# Patient Record
Sex: Female | Born: 1960
Health system: Southern US, Community
[De-identification: ages and names within clinical notes are randomized; demographics above are authoritative.]

## PROBLEM LIST (undated history)

## (undated) DIAGNOSIS — F32A Depression, unspecified: Secondary | ICD-10-CM

## (undated) DIAGNOSIS — F419 Anxiety disorder, unspecified: Secondary | ICD-10-CM

## (undated) DIAGNOSIS — D649 Anemia, unspecified: Secondary | ICD-10-CM

## (undated) DIAGNOSIS — M549 Dorsalgia, unspecified: Secondary | ICD-10-CM

## (undated) DIAGNOSIS — T7840XA Allergy, unspecified, initial encounter: Secondary | ICD-10-CM

## (undated) DIAGNOSIS — K648 Other hemorrhoids: Secondary | ICD-10-CM

## (undated) DIAGNOSIS — G4730 Sleep apnea, unspecified: Secondary | ICD-10-CM

## (undated) DIAGNOSIS — K219 Gastro-esophageal reflux disease without esophagitis: Secondary | ICD-10-CM

## (undated) DIAGNOSIS — G510 Bell's palsy: Secondary | ICD-10-CM

## (undated) DIAGNOSIS — K760 Fatty (change of) liver, not elsewhere classified: Secondary | ICD-10-CM

## (undated) DIAGNOSIS — M1990 Unspecified osteoarthritis, unspecified site: Secondary | ICD-10-CM

## (undated) DIAGNOSIS — I10 Essential (primary) hypertension: Secondary | ICD-10-CM

## (undated) DIAGNOSIS — E785 Hyperlipidemia, unspecified: Secondary | ICD-10-CM

## (undated) DIAGNOSIS — G35 Multiple sclerosis: Secondary | ICD-10-CM

## (undated) DIAGNOSIS — K222 Esophageal obstruction: Secondary | ICD-10-CM

## (undated) DIAGNOSIS — K449 Diaphragmatic hernia without obstruction or gangrene: Secondary | ICD-10-CM

## (undated) DIAGNOSIS — K5790 Diverticulosis of intestine, part unspecified, without perforation or abscess without bleeding: Secondary | ICD-10-CM

## (undated) DIAGNOSIS — F329 Major depressive disorder, single episode, unspecified: Secondary | ICD-10-CM

## (undated) DIAGNOSIS — G35D Multiple sclerosis, unspecified: Secondary | ICD-10-CM

## (undated) HISTORY — DX: Essential (primary) hypertension: I10

## (undated) HISTORY — DX: Diverticulosis of intestine, part unspecified, without perforation or abscess without bleeding: K57.90

## (undated) HISTORY — DX: Other hemorrhoids: K64.8

## (undated) HISTORY — DX: Hyperlipidemia, unspecified: E78.5

## (undated) HISTORY — DX: Anxiety disorder, unspecified: F41.9

## (undated) HISTORY — DX: Major depressive disorder, single episode, unspecified: F32.9

## (undated) HISTORY — PX: COLONOSCOPY: SHX174

## (undated) HISTORY — DX: Multiple sclerosis, unspecified: G35.D

## (undated) HISTORY — DX: Unspecified osteoarthritis, unspecified site: M19.90

## (undated) HISTORY — DX: Multiple sclerosis: G35

## (undated) HISTORY — DX: Allergy, unspecified, initial encounter: T78.40XA

## (undated) HISTORY — PX: UPPER GASTROINTESTINAL ENDOSCOPY: SHX188

## (undated) HISTORY — DX: Diaphragmatic hernia without obstruction or gangrene: K44.9

## (undated) HISTORY — DX: Gastro-esophageal reflux disease without esophagitis: K21.9

## (undated) HISTORY — DX: Anemia, unspecified: D64.9

## (undated) HISTORY — DX: Sleep apnea, unspecified: G47.30

## (undated) HISTORY — DX: Depression, unspecified: F32.A

## (undated) HISTORY — PX: WISDOM TOOTH EXTRACTION: SHX21

## (undated) HISTORY — DX: Fatty (change of) liver, not elsewhere classified: K76.0

## (undated) HISTORY — DX: Esophageal obstruction: K22.2

## (undated) HISTORY — DX: Bell's palsy: G51.0

---

## 1998-12-05 ENCOUNTER — Other Ambulatory Visit: Admission: RE | Admit: 1998-12-05 | Discharge: 1998-12-05 | Payer: Self-pay | Admitting: Obstetrics and Gynecology

## 2000-06-04 ENCOUNTER — Other Ambulatory Visit: Admission: RE | Admit: 2000-06-04 | Discharge: 2000-06-04 | Payer: Self-pay | Admitting: Obstetrics and Gynecology

## 2000-08-12 ENCOUNTER — Other Ambulatory Visit: Admission: RE | Admit: 2000-08-12 | Discharge: 2000-08-12 | Payer: Self-pay | Admitting: Obstetrics and Gynecology

## 2000-08-12 ENCOUNTER — Encounter (INDEPENDENT_AMBULATORY_CARE_PROVIDER_SITE_OTHER): Payer: Self-pay

## 2001-01-20 ENCOUNTER — Ambulatory Visit (HOSPITAL_COMMUNITY): Admission: RE | Admit: 2001-01-20 | Discharge: 2001-01-20 | Payer: Self-pay | Admitting: Obstetrics and Gynecology

## 2001-01-20 ENCOUNTER — Encounter: Payer: Self-pay | Admitting: Obstetrics and Gynecology

## 2002-04-10 ENCOUNTER — Encounter (INDEPENDENT_AMBULATORY_CARE_PROVIDER_SITE_OTHER): Payer: Self-pay

## 2002-04-10 ENCOUNTER — Observation Stay (HOSPITAL_COMMUNITY): Admission: RE | Admit: 2002-04-10 | Discharge: 2002-04-11 | Payer: Self-pay | Admitting: Obstetrics and Gynecology

## 2002-08-01 ENCOUNTER — Ambulatory Visit (HOSPITAL_COMMUNITY): Admission: RE | Admit: 2002-08-01 | Discharge: 2002-08-01 | Payer: Self-pay | Admitting: Internal Medicine

## 2002-08-01 ENCOUNTER — Encounter: Payer: Self-pay | Admitting: Internal Medicine

## 2003-04-28 HISTORY — PX: ABDOMINAL HYSTERECTOMY: SHX81

## 2004-02-26 ENCOUNTER — Ambulatory Visit (HOSPITAL_COMMUNITY): Admission: RE | Admit: 2004-02-26 | Discharge: 2004-02-26 | Payer: Self-pay | Admitting: Obstetrics and Gynecology

## 2005-04-02 ENCOUNTER — Ambulatory Visit (HOSPITAL_COMMUNITY): Admission: RE | Admit: 2005-04-02 | Discharge: 2005-04-02 | Payer: Self-pay | Admitting: Neurosurgery

## 2005-07-02 ENCOUNTER — Ambulatory Visit: Payer: Self-pay | Admitting: Family Medicine

## 2005-07-14 ENCOUNTER — Ambulatory Visit (HOSPITAL_COMMUNITY): Admission: RE | Admit: 2005-07-14 | Discharge: 2005-07-14 | Payer: Self-pay | Admitting: Family Medicine

## 2005-07-28 ENCOUNTER — Encounter: Admission: RE | Admit: 2005-07-28 | Discharge: 2005-08-10 | Payer: Self-pay | Admitting: Neurology

## 2005-07-28 ENCOUNTER — Ambulatory Visit: Payer: Self-pay | Admitting: *Deleted

## 2005-07-29 ENCOUNTER — Ambulatory Visit (HOSPITAL_COMMUNITY): Admission: RE | Admit: 2005-07-29 | Discharge: 2005-07-29 | Payer: Self-pay | Admitting: Neurology

## 2005-08-03 ENCOUNTER — Ambulatory Visit (HOSPITAL_COMMUNITY): Admission: RE | Admit: 2005-08-03 | Discharge: 2005-08-04 | Payer: Self-pay | Admitting: *Deleted

## 2005-08-04 ENCOUNTER — Ambulatory Visit: Payer: Self-pay | Admitting: *Deleted

## 2005-08-06 ENCOUNTER — Ambulatory Visit: Payer: Self-pay | Admitting: Cardiology

## 2005-08-17 ENCOUNTER — Ambulatory Visit: Payer: Self-pay | Admitting: *Deleted

## 2005-08-27 ENCOUNTER — Ambulatory Visit: Payer: Self-pay | Admitting: Family Medicine

## 2005-09-03 ENCOUNTER — Ambulatory Visit: Payer: Self-pay | Admitting: Internal Medicine

## 2006-01-13 ENCOUNTER — Ambulatory Visit: Payer: Self-pay | Admitting: Family Medicine

## 2006-01-25 ENCOUNTER — Encounter: Payer: Self-pay | Admitting: Family Medicine

## 2006-05-19 ENCOUNTER — Ambulatory Visit: Payer: Self-pay | Admitting: Family Medicine

## 2006-05-26 ENCOUNTER — Encounter: Payer: Self-pay | Admitting: Family Medicine

## 2006-05-26 LAB — CONVERTED CEMR LAB
CO2: 29 meq/L (ref 19–32)
Chloride: 102 meq/L (ref 96–112)
Sodium: 140 meq/L (ref 135–145)
VLDL: 50 mg/dL — ABNORMAL HIGH (ref 0–40)

## 2006-09-15 ENCOUNTER — Ambulatory Visit: Payer: Self-pay | Admitting: Family Medicine

## 2006-11-19 ENCOUNTER — Ambulatory Visit: Payer: Self-pay | Admitting: Family Medicine

## 2006-11-19 LAB — CONVERTED CEMR LAB
BUN: 10 mg/dL (ref 6–23)
CO2: 23 meq/L (ref 19–32)
Calcium: 9.1 mg/dL (ref 8.4–10.5)
Potassium: 4 meq/L (ref 3.5–5.3)
Sodium: 141 meq/L (ref 135–145)

## 2006-11-22 ENCOUNTER — Ambulatory Visit (HOSPITAL_COMMUNITY): Admission: RE | Admit: 2006-11-22 | Discharge: 2006-11-22 | Payer: Self-pay | Admitting: Family Medicine

## 2007-03-07 ENCOUNTER — Encounter: Payer: Self-pay | Admitting: Family Medicine

## 2007-03-07 LAB — CONVERTED CEMR LAB
ALT: 16 units/L (ref 0–35)
Albumin: 4.7 g/dL (ref 3.5–5.2)
BUN: 16 mg/dL (ref 6–23)
Basophils Absolute: 0.1 10*3/uL (ref 0.0–0.1)
Basophils Relative: 1 % (ref 0–1)
CO2: 25 meq/L (ref 19–32)
Calcium: 9.8 mg/dL (ref 8.4–10.5)
Chloride: 98 meq/L (ref 96–112)
Cholesterol: 271 mg/dL — ABNORMAL HIGH (ref 0–200)
Creatinine, Ser: 1.11 mg/dL (ref 0.40–1.20)
Eosinophils Relative: 2 % (ref 0–5)
HCT: 35.5 % — ABNORMAL LOW (ref 36.0–46.0)
Lymphs Abs: 2 10*3/uL (ref 0.7–4.0)
MCV: 77.7 fL — ABNORMAL LOW (ref 78.0–100.0)
Neutro Abs: 4.2 10*3/uL (ref 1.7–7.7)
Sodium: 139 meq/L (ref 135–145)
Total Bilirubin: 0.5 mg/dL (ref 0.3–1.2)
VLDL: 47 mg/dL — ABNORMAL HIGH (ref 0–40)

## 2007-03-09 ENCOUNTER — Ambulatory Visit: Payer: Self-pay | Admitting: Family Medicine

## 2007-04-28 ENCOUNTER — Encounter: Payer: Self-pay | Admitting: Family Medicine

## 2007-05-03 ENCOUNTER — Ambulatory Visit: Payer: Self-pay | Admitting: Family Medicine

## 2007-07-05 ENCOUNTER — Encounter: Payer: Self-pay | Admitting: Family Medicine

## 2007-07-05 LAB — CONVERTED CEMR LAB: Retic Ct Pct: 1.1 % (ref 0.4–3.1)

## 2007-07-07 ENCOUNTER — Encounter: Payer: Self-pay | Admitting: Family Medicine

## 2007-07-07 ENCOUNTER — Ambulatory Visit: Payer: Self-pay | Admitting: Family Medicine

## 2007-08-18 ENCOUNTER — Encounter (HOSPITAL_COMMUNITY): Admission: RE | Admit: 2007-08-18 | Discharge: 2007-09-17 | Payer: Self-pay | Admitting: Family Medicine

## 2007-08-18 ENCOUNTER — Encounter: Payer: Self-pay | Admitting: Family Medicine

## 2007-08-19 ENCOUNTER — Encounter: Payer: Self-pay | Admitting: Family Medicine

## 2007-08-19 ENCOUNTER — Ambulatory Visit: Payer: Self-pay | Admitting: Family Medicine

## 2007-11-18 ENCOUNTER — Ambulatory Visit: Payer: Self-pay | Admitting: Family Medicine

## 2007-11-19 ENCOUNTER — Encounter: Payer: Self-pay | Admitting: Family Medicine

## 2007-11-19 LAB — CONVERTED CEMR LAB
ALT: 12 units/L (ref 0–35)
AST: 14 units/L (ref 0–37)
Albumin: 4.6 g/dL (ref 3.5–5.2)
BUN: 16 mg/dL (ref 6–23)
CO2: 24 meq/L (ref 19–32)
Calcium: 9.4 mg/dL (ref 8.4–10.5)
Chloride: 98 meq/L (ref 96–112)
Creatinine, Ser: 1.12 mg/dL (ref 0.40–1.20)
LDL Cholesterol: 193 mg/dL — ABNORMAL HIGH (ref 0–99)
Potassium: 3.8 meq/L (ref 3.5–5.3)
Sodium: 137 meq/L (ref 135–145)
TSH: 1.586 microintl units/mL (ref 0.350–4.50)
Total Bilirubin: 0.5 mg/dL (ref 0.3–1.2)
Total Protein: 7.5 g/dL (ref 6.0–8.3)
VLDL: 39 mg/dL (ref 0–40)

## 2007-11-21 ENCOUNTER — Ambulatory Visit: Payer: Self-pay | Admitting: Cardiology

## 2007-11-23 ENCOUNTER — Ambulatory Visit (HOSPITAL_COMMUNITY): Admission: RE | Admit: 2007-11-23 | Discharge: 2007-11-23 | Payer: Self-pay | Admitting: Family Medicine

## 2007-11-24 ENCOUNTER — Telehealth: Payer: Self-pay | Admitting: Family Medicine

## 2007-12-05 ENCOUNTER — Encounter (HOSPITAL_COMMUNITY): Admission: RE | Admit: 2007-12-05 | Discharge: 2008-01-04 | Payer: Self-pay | Admitting: Cardiology

## 2007-12-05 ENCOUNTER — Ambulatory Visit: Payer: Self-pay | Admitting: Cardiology

## 2007-12-16 ENCOUNTER — Ambulatory Visit: Payer: Self-pay | Admitting: Family Medicine

## 2007-12-16 DIAGNOSIS — E785 Hyperlipidemia, unspecified: Secondary | ICD-10-CM | POA: Insufficient documentation

## 2007-12-16 DIAGNOSIS — I10 Essential (primary) hypertension: Secondary | ICD-10-CM | POA: Insufficient documentation

## 2007-12-18 DIAGNOSIS — R51 Headache: Secondary | ICD-10-CM

## 2007-12-18 DIAGNOSIS — F418 Other specified anxiety disorders: Secondary | ICD-10-CM | POA: Insufficient documentation

## 2007-12-18 DIAGNOSIS — G4700 Insomnia, unspecified: Secondary | ICD-10-CM | POA: Insufficient documentation

## 2007-12-18 DIAGNOSIS — R519 Headache, unspecified: Secondary | ICD-10-CM | POA: Insufficient documentation

## 2007-12-18 DIAGNOSIS — G44309 Post-traumatic headache, unspecified, not intractable: Secondary | ICD-10-CM | POA: Insufficient documentation

## 2007-12-19 ENCOUNTER — Ambulatory Visit: Payer: Self-pay | Admitting: Cardiology

## 2007-12-19 ENCOUNTER — Encounter: Payer: Self-pay | Admitting: Family Medicine

## 2008-01-26 ENCOUNTER — Telehealth: Payer: Self-pay | Admitting: Family Medicine

## 2008-04-03 ENCOUNTER — Encounter: Payer: Self-pay | Admitting: Family Medicine

## 2008-04-04 ENCOUNTER — Ambulatory Visit: Payer: Self-pay | Admitting: Family Medicine

## 2008-04-04 DIAGNOSIS — G35 Multiple sclerosis: Secondary | ICD-10-CM | POA: Insufficient documentation

## 2008-04-04 DIAGNOSIS — R5383 Other fatigue: Secondary | ICD-10-CM | POA: Insufficient documentation

## 2008-04-04 HISTORY — DX: Multiple sclerosis: G35

## 2008-04-06 LAB — CONVERTED CEMR LAB
AST: 14 units/L (ref 0–37)
Glucose, Bld: 73 mg/dL (ref 70–99)
Sodium: 139 meq/L (ref 135–145)
Total Bilirubin: 0.4 mg/dL (ref 0.3–1.2)
Total CHOL/HDL Ratio: 6.4

## 2008-04-08 DIAGNOSIS — E663 Overweight: Secondary | ICD-10-CM | POA: Insufficient documentation

## 2009-12-16 ENCOUNTER — Ambulatory Visit: Payer: Self-pay | Admitting: Family Medicine

## 2009-12-16 DIAGNOSIS — M949 Disorder of cartilage, unspecified: Secondary | ICD-10-CM

## 2009-12-16 DIAGNOSIS — M899 Disorder of bone, unspecified: Secondary | ICD-10-CM | POA: Insufficient documentation

## 2009-12-16 DIAGNOSIS — M25519 Pain in unspecified shoulder: Secondary | ICD-10-CM | POA: Insufficient documentation

## 2009-12-16 DIAGNOSIS — M542 Cervicalgia: Secondary | ICD-10-CM | POA: Insufficient documentation

## 2009-12-17 ENCOUNTER — Encounter: Payer: Self-pay | Admitting: Family Medicine

## 2009-12-20 ENCOUNTER — Telehealth: Payer: Self-pay | Admitting: Family Medicine

## 2009-12-20 LAB — CONVERTED CEMR LAB
ALT: 22 units/L (ref 0–35)
Albumin: 4.7 g/dL (ref 3.5–5.2)
Basophils Relative: 1 % (ref 0–1)
CO2: 25 meq/L (ref 19–32)
Creatinine, Ser: 0.89 mg/dL (ref 0.40–1.20)
Eosinophils Absolute: 0.1 10*3/uL (ref 0.0–0.7)
Eosinophils Relative: 2 % (ref 0–5)
Glucose, Bld: 87 mg/dL (ref 70–99)
Hemoglobin: 12.6 g/dL (ref 12.0–15.0)
LDL Cholesterol: 194 mg/dL — ABNORMAL HIGH (ref 0–99)
Lymphocytes Relative: 34 % (ref 12–46)
Lymphs Abs: 2.7 10*3/uL (ref 0.7–4.0)
MCHC: 31.8 g/dL (ref 30.0–36.0)
Monocytes Absolute: 0.6 10*3/uL (ref 0.1–1.0)
Monocytes Relative: 8 % (ref 3–12)
Platelets: 373 10*3/uL (ref 150–400)
RBC: 4.93 M/uL (ref 3.87–5.11)
Sodium: 140 meq/L (ref 135–145)
TSH: 2.812 microintl units/mL (ref 0.350–4.500)
Total Bilirubin: 0.3 mg/dL (ref 0.3–1.2)
Vit D, 25-Hydroxy: 57 ng/mL (ref 30–89)

## 2009-12-23 ENCOUNTER — Telehealth: Payer: Self-pay | Admitting: Family Medicine

## 2009-12-26 ENCOUNTER — Ambulatory Visit (HOSPITAL_COMMUNITY): Admission: RE | Admit: 2009-12-26 | Discharge: 2009-12-26 | Payer: Self-pay | Admitting: Family Medicine

## 2010-04-07 ENCOUNTER — Telehealth: Payer: Self-pay | Admitting: Family Medicine

## 2010-04-22 ENCOUNTER — Encounter: Payer: Self-pay | Admitting: Family Medicine

## 2010-05-18 ENCOUNTER — Encounter: Payer: Self-pay | Admitting: Family Medicine

## 2010-05-27 ENCOUNTER — Ambulatory Visit
Admission: RE | Admit: 2010-05-27 | Discharge: 2010-05-27 | Payer: Self-pay | Source: Home / Self Care | Attending: Family Medicine | Admitting: Family Medicine

## 2010-05-27 ENCOUNTER — Encounter: Payer: Self-pay | Admitting: Family Medicine

## 2010-05-27 NOTE — Letter (Signed)
Summary: LABS  LABS   Imported By: Lind Guest 11/08/2009 13:24:06  _____________________________________________________________________  External Attachment:    Type:   Image     Comment:   External Document

## 2010-05-27 NOTE — Letter (Signed)
Summary: MISC  MISC   Imported By: Lind Guest 11/08/2009 13:24:36  _____________________________________________________________________  External Attachment:    Type:   Image     Comment:   External Document

## 2010-05-27 NOTE — Letter (Signed)
Summary: X RAY  X RAY   Imported By: Lind Guest 11/08/2009 13:26:06  _____________________________________________________________________  External Attachment:    Type:   Image     Comment:   External Document

## 2010-05-27 NOTE — Letter (Signed)
Summary: OFFICE NOTES  OFFICE NOTES   Imported By: Lind Guest 11/08/2009 13:25:08  _____________________________________________________________________  External Attachment:    Type:   Image     Comment:   External Document

## 2010-05-27 NOTE — Letter (Signed)
Summary: Letter  Letter   Imported By: Lind Guest 12/17/2009 14:21:24  _____________________________________________________________________  External Attachment:    Type:   Image     Comment:   External Document

## 2010-05-27 NOTE — Assessment & Plan Note (Signed)
Summary: FOLLOW UP/SLJ   Vital Signs:  Patient profile:   50 year old female Height:      63 inches Weight:      186 pounds BMI:     33.07 O2 Sat:      96 % Pulse rate:   82 / minute Pulse rhythm:   regular Resp:     16 per minute BP sitting:   130 / 84  (left arm) Cuff size:   regular  Vitals Entered By: Everitt Amber LPN (December 16, 2009 8:59 AM)  Nutrition Counseling: Patient's BMI is greater than 25 and therefore counseled on weight management options. CC: Follow up chronic problems, not taking any meds at this time and wants to see if you want her on the same meds as before or different  Comments not taking any meds at this time.   CC:  Follow up chronic problems and not taking any meds at this time and wants to see if you want her on the same meds as before or different .  History of Present Illness: Reports  that she has not been doing well.She stopped all of her meds for more than 6 months. She has noted new neurological problems and wants to go back to see a neurologist again. Denies recent fever or chills. Denies sinus pressure, nasal congestion , ear pain or sore throat. Denies chest congestion, or cough productive of sputum. Denies chest pain, palpitations, PND, orthopnea or leg swelling. Denies abdominal pain, nausea, vomitting, diarrhea or constipation. Denies change in bowel movements or bloody stool. Denies dysuria , frequency, incontinence or hesitancy. Denies  joint pain, swelling, or reduced mobility. Increased fequency of migraine headaches in the past 3 to 4 months which she attributes to increased stress.  Denies  rash, lesions, or itch.     Allergies: No Known Drug Allergies  Review of Systems      See HPI General:  Complains of fatigue and sleep disorder; poor sleep , wants to resume zolpidem. Eyes:  Complains of blurring; recently had change in her lenses. ENT:  Denies earache, hoarseness, and nasal congestion. CV:  Denies chest pain or  discomfort, difficulty breathing while lying down, palpitations, shortness of breath with exertion, and swelling of feet. Resp:  Denies cough, hypersomnolence, shortness of breath, and sputum productive. GI:  Denies abdominal pain, constipation, diarrhea, nausea, and vomiting. GU:  Denies dysuria, incontinence, and urinary frequency. MS:  Complains of joint pain and stiffness; right knee pain and instability worse 1 year ago, thinks she tore a meniscus, better now but still unsteady. Derm:  Denies itching, lesion(s), and rash. Neuro:  Complains of headaches and tingling; left upper extremitiy x 6 months also significant decrease in vision, coorected wioth new lenses ,needs to re-establish with neurology ,had stopped benefitting from mS treatments she thought increased headache frequency in the past 2 mths approx 2/week, classic migraine . Psych:  Complains of anxiety, depression, irritability, and mental problems; denies suicidal thoughts/plans, thoughts of violence, and unusual visions or sounds; moood instability worse in the past , increasing probs with handling stress, flies off the handle then plummmets, worse in the past 4 months. Endo:  Denies excessive hunger, excessive thirst, heat intolerance, and polyuria. Heme:  Denies abnormal bruising, bleeding, fevers, and pallor. Allergy:  Denies hives or rash, itching eyes, seasonal allergies, and sneezing.  Physical Exam  General:  Well-developed,well-nourished,in no acute distress; alert,appropriate and cooperative throughout examination HEENT: No facial asymmetry,  EOMI, No sinus tenderness, TM's Clear,  oropharynx  pink and moist.   Chest: Clear to auscultation bilaterally.  CVS: S1, S2, No murmurs, No S3.   Abd: Soft, Nontender.  MS: Adequate ROM spine, hips, shoulders and  reduced in the knees.  Ext: No edema.   CNS: CN 2-12 intact, power tone and sensation normal throughout.   Skin: Intact, no visible lesions or rashes.  Psych: Good  eye contact, normal affect.  Memory intact, not anxious or depressed appearing.    Impression & Recommendations:  Problem # 1:  DISORDER OF BONE AND CARTILAGE UNSPECIFIED (ICD-733.90) Assessment Comment Only  Orders: T-Vitamin D (25-Hydroxy) ([id number])  Problem # 2:  SPECIAL SCREENING FOR MALIGNANT NEOPLASMS COLON (ICD-V76.51) Assessment: Comment Only  Orders: Gastroenterology Referral (GI)  Problem # 3:  SHOULDER PAIN, BILATERAL (ICD-719.41) Assessment: Deteriorated  The following medications were removed from the medication list:    Ibuprofen 800 Mg Tabs (Ibuprofen) ..... One tab by mouth two times a day as needed Her updated medication list for this problem includes:    Ibuprofen 800 Mg Tabs (Ibuprofen) .Marland Kitchen... Take 1 tablet by mouth two times a day as needed severe headache  Orders: Physical Therapy Referral (PT)  Problem # 4:  NECK AND BACK PAIN (ICD-723.1) Assessment: Deteriorated  The following medications were removed from the medication list:    Ibuprofen 800 Mg Tabs (Ibuprofen) ..... One tab by mouth two times a day as needed Her updated medication list for this problem includes:    Ibuprofen 800 Mg Tabs (Ibuprofen) .Marland Kitchen... Take 1 tablet by mouth two times a day as needed severe headache  Orders: Physical Therapy Referral (PT)  Problem # 5:  OBESITY, UNSPECIFIED (ICD-278.00) Assessment: Unchanged  Ht: 63 (12/16/2009)   Wt: 186 (12/16/2009)   BMI: 33.07 (12/16/2009)  Problem # 6:  HYPERTENSION (ICD-401.9) Assessment: Improved  The following medications were removed from the medication list:    Maxzide-25 37.5-25 Mg Tabs (Triamterene-hctz) .Marland Kitchen... 1.5 tabs daily    Lisinopril 20 Mg Tabs (Lisinopril) .Marland Kitchen... Take 1 tablet by mouth once a day    Amlodipine Besylate 10 Mg Tabs (Amlodipine besylate) .Marland Kitchen... Take 1 tablet by mouth once a day Her updated medication list for this problem includes:    Lisinopril 10 Mg Tabs (Lisinopril) .Marland Kitchen... Take 1 tablet by mouth once a  day  Orders: T-Basic Metabolic Panel [id number])  BP today: 130/84 Prior BP: 108/68 (04/04/2008)  Labs Reviewed: K+: 3.4 (04/03/2008) Creat: : 0.88 (04/03/2008)   Chol: 254 (04/03/2008)   HDL: 40 (04/03/2008)   LDL: 161 (04/03/2008)   TG: 265 (04/03/2008)  Problem # 7:  INSOMNIA (ICD-780.52) Assessment: Unchanged  Her updated medication list for this problem includes:    Zolpidem Tartrate 10 Mg Tabs (Zolpidem tartrate) ..... One tab by mouth at bedtime    Zolpidem Tartrate 10 Mg Tabs (Zolpidem tartrate) .Marland Kitchen... Take 1 tab by mouth at bedtime as needed  Discussed sleep hygiene.   Problem # 8:  DEPRESSION (ICD-311) Assessment: Deteriorated  The following medications were removed from the medication list:    Fluoxetine Hcl 20 Mg Caps (Fluoxetine hcl) ..... One cap by mouth once daily Her updated medication list for this problem includes:    Fluoxetine Hcl 20 Mg Caps (Fluoxetine hcl) .Marland Kitchen... Take 1 capsule by mouth once a day  Complete Medication List: 1)  Zolpidem Tartrate 10 Mg Tabs (Zolpidem tartrate) .... One tab by mouth at bedtime 2)  Lisinopril 10 Mg Tabs (Lisinopril) .... Take 1 tablet by mouth once a day 3)  Zolpidem Tartrate 10 Mg Tabs (Zolpidem tartrate) .... Take 1 tab by mouth at bedtime as needed 4)  Fluoxetine Hcl 20 Mg Caps (Fluoxetine hcl) .... Take 1 capsule by mouth once a day 5)  Topiramate 50 Mg Tabs (Topiramate) .... Take 1 tablet by mouth two times a day 6)  Promethazine Hcl 25 Mg Tabs (Promethazine hcl) .... Take 1 tablet by mouth two times a day as needed severe nausea 7)  Ibuprofen 800 Mg Tabs (Ibuprofen) .... Take 1 tablet by mouth two times a day as needed severe headache  Other Orders: Radiology Referral (Radiology) T-Hepatic Function [phone]) T-Lipid Profile [phone]) T-CBC w/Diff [phone]) T-TSH [phone]) Neurology Referral (Neuro)  Patient Instructions: 1)  Please schedule a follow-up appointment in .5 months. 2)  It is  important that you exercise regularly at least 20 minutes 5 times a week. If you develop chest pain, have severe difficulty breathing, or feel very tired , stop exercising immediately and seek medical attention. 3)  You need to lose weight. Consider a lower calorie diet and regular exercise.  4)  you are being referred to neurology, physical therapy, for a colonscopy and a mamogram 5)  Pls call if you need to go to ortho or for counselling 6)  meds as discussed. 7)  BMP prior to visit, ICD-9: 8)  Hepatic Panel prior to visit, ICD-9: 9)  Lipid Panel prior to visit, ICD-9: 10)  TSH prior to visit, ICD-9:  fasting today 11)  CBC w/ Diff prior to visit, ICD-9: 12)  Vit D level Prescriptions: IBUPROFEN 800 MG TABS (IBUPROFEN) Take 1 tablet by mouth two times a day as needed severe headache  #30 x 0   Entered and Authorized by:   Syliva Overman MD   Signed by:   Syliva Overman MD on 12/16/2009   Method used:   Electronically to        [facility] [address] [facility] (retail)       [address]       [place], [place]  [address]       Ph: [phone]       Fax: [phone]   RxID:   [id number] PROMETHAZINE HCL 25 MG TABS (PROMETHAZINE HCL) Take 1 tablet by mouth two times a day as needed severe nausea  #20 x 0   Entered and Authorized by:   Syliva Overman MD   Signed by:   Syliva Overman MD on 12/16/2009   Method used:   Electronically to        [facility] [address] [facility] (retail)       [address]       [place], [place]  [address]       Ph: [phone]       Fax: [phone]   RxID:   [id number] TOPIRAMATE 50 MG TABS (TOPIRAMATE) Take 1 tablet by mouth two times a day  #60 x 3   Entered and Authorized by:   Syliva Overman MD   Signed by:   Syliva Overman MD on 12/16/2009   Method used:   Electronically to        [facility] (retail)       [address]       [place], [place]  [address]       Ph: [phone]  Fax: [phone]   RxID:   [id number] FLUOXETINE HCL 20 MG CAPS (FLUOXETINE HCL) Take 1 capsule by mouth once a day  #30 x 3   Entered and Authorized by:   Syliva Overman MD   Signed by:   Syliva Overman MD on 12/16/2009   Method used:   Handwritten   RxID:   [id number] ZOLPIDEM TARTRATE 10 MG TABS (ZOLPIDEM TARTRATE) Take 1 tab by mouth at bedtime as needed  #30 x 3   Entered and Authorized by:   Syliva Overman MD   Signed by:   Syliva Overman MD on 12/16/2009   Method used:   Printed then faxed to ...       [facility] [address] [facility] (retail)       [address]       [place], [place]  [address]       Ph: [phone]       Fax: [phone]   RxID:   [id number] LISINOPRIL 10 MG TABS (LISINOPRIL) Take 1 tablet by mouth once a day  #30 x 3   Entered and Authorized by:   Syliva Overman MD   Signed by:   Syliva Overman MD on 12/16/2009   Method used:   Electronically to        [facility] [address] [facility] ([facility]ail)       [address]       [place], [place]  [address]       Ph: [phone]       Fax: [phone]   RxID:   [id number]

## 2010-05-27 NOTE — Letter (Signed)
Summary: DEMO  DEMO   Imported By: Lind Guest 11/08/2009 13:23:04  _____________________________________________________________________  External Attachment:    Type:   Image     Comment:   External Document

## 2010-05-27 NOTE — Miscellaneous (Signed)
  Clinical Lists Changes  Medications: Added new medication of CRESTOR 20 MG TABS (ROSUVASTATIN CALCIUM) Take 1 tab by mouth at bedtime - Signed Rx of CRESTOR 20 MG TABS (ROSUVASTATIN CALCIUM) Take 1 tab by mouth at bedtime;  #30 x 3;  Signed;  Entered by: Syliva Overman MD;  Authorized by: Syliva Overman MD;  Method used: Electronically to [facility] [address], [place], [place], [place]  [address], Ph: [phone], Fax: [phone]    Prescriptions: CRESTOR 20 MG TABS (ROSUVASTATIN CALCIUM) Take 1 tab by mouth at bedtime  #30 x 3   Entered and Authorized by:   Syliva Overman MD   Signed by:   Syliva Overman MD on 12/17/2009   Method used:   Electronically to        [facility] [address] [facility] ([facility]ail)       [address]       [place], [place]  [address]       Ph: [phone]       Fax: [phone]   RxID:   [id number]

## 2010-05-27 NOTE — Letter (Signed)
Summary: CONSULTS  CONSULTS   Imported By: Lind Guest 11/08/2009 13:22:25  _____________________________________________________________________  External Attachment:    Type:   Image     Comment:   External Document

## 2010-05-27 NOTE — Letter (Signed)
Summary: HISTORY AND PHYSICAL  HISTORY AND PHYSICAL   Imported By: Lind Guest 11/08/2009 13:23:39  _____________________________________________________________________  External Attachment:    Type:   Image     Comment:   External Document

## 2010-05-27 NOTE — Progress Notes (Signed)
  Phone Note Call from Patient   Caller: Patient Summary of Call: wants a generic cholesterol med, crestor to expensive Initial call taken by: Adella Hare LPN,  December 23, 2009 4:15 PM  Follow-up for Phone Call        i changed it to pravastatin, pls let her know and stamp and fax with d/c crestor written Follow-up by: Syliva Overman MD,  December 23, 2009 4:34 PM  Additional Follow-up for Phone Call Additional follow up Details #1::        called patient, unavailable Additional Follow-up by: Adella Hare LPN,  December 23, 2009 4:40 PM    New/Updated Medications: PRAVASTATIN SODIUM 40 MG TABS (PRAVASTATIN SODIUM) 2 tablets at bedtime PRAVASTATIN SODIUM 40 MG TABS (PRAVASTATIN SODIUM) 2 tablets at bedtime discontinue crestor Prescriptions: PRAVASTATIN SODIUM 40 MG TABS (PRAVASTATIN SODIUM) 2 tablets at bedtime discontinue crestor  #60 x 4   Entered by:   Adella Hare LPN   Authorized by:   Syliva Overman MD   Signed by:   Adella Hare LPN on 16/01/9603   Method used:   Electronically to        [facility] [address] (retail)       [address]       [place], [place]  [address]       Ph: [phone]       Fax: [phone]   RxID:   [id number] PRAVASTATIN SODIUM 40 MG TABS (PRAVASTATIN SODIUM) 2 tablets at bedtime  #60 x 4   Entered and Authorized by:   Syliva Overman MD   Signed by:   Syliva Overman MD on 12/23/2009   Method used:   Printed then faxed to ...       [facility] [address] [facility] ([facility])       [address]       [place], [place]  [address]       Ph: [phone]       Fax: [phone]   RxID:   [id number]

## 2010-05-27 NOTE — Progress Notes (Signed)
Summary: call  Phone Note Call from Patient   Summary of Call: she forgot to change her cell call at 949.0633 she received a letter Initial call taken by: Lind Guest,  December 20, 2009 1:02 PM  Follow-up for Phone Call        Phone Call Completed advised of lab results Follow-up by: Adella Hare LPN,  December 20, 2009 1:30 PM

## 2010-05-27 NOTE — Letter (Signed)
Summary: PHONE NOTES  PHONE NOTES   Imported By: Lind Guest 11/08/2009 13:25:40  _____________________________________________________________________  External Attachment:    Type:   Image     Comment:   External Document

## 2010-05-29 NOTE — Progress Notes (Signed)
Summary: MEDICATION  Phone Note Call from Patient   Summary of Call: PATIENT STATES THAT KNOW LONGER REVCD [facility]ID[facility]ATION THRU [facility] PLEASE REFILL RX THRU [facility] NEED REFILL ON PRAVASTATIN 40 MG, FLUOXETINE GCL 20 MG, LISINOPRIL 10 MG, TOPRIMATE 50 MG Initial call taken by: Eugenio Hoes,  April 07, 2010 11:27 AM  Follow-up for Phone Call        pls send in 90 day supply only of meds requested and let her know Follow-up by: Syliva Overman MD,  April 08, 2010 6:24 AM    Prescriptions: PRAVASTATIN SODIUM 40 MG TABS (PRAVASTATIN SODIUM) 2 tablets at bedtime discontinue crestor  #180 x 0   Entered by:   Adella Hare LPN   Authorized by:   Syliva Overman MD   Signed by:   Adella Hare LPN on 09/81/1914   Method used:   Faxed to ...       [facility] (mail-order)             , [place]         Ph: [phone]       Fax: [phone]   RxID:   [id number] TOPIRAMATE 50 MG TABS (TOPIRAMATE) Take 1 tablet by mouth two times a day  #180 x 0   Entered by:   Adella Hare LPN   Authorized by:   Syliva Overman MD   Signed by:   Adella Hare LPN on 53/66/4403   Method used:   Faxed to ...       [facility] (mail-order)             , [place]         Ph: [phone]       Fax: [phone]   RxID:   [id number] FLUOXETINE HCL 20 MG CAPS (FLUOXETINE HCL) Take 1 capsule by mouth once a day  #90 x 0   Entered by:   Adella Hare LPN   Authorized by:   Syliva Overman MD   Signed by:   Adella Hare LPN on 32/35/5732   Method used:   Faxed to ...       [facility] (mail-order)             , [place]         Ph: [phone]       Fax: [phone]   RxID:   [id number] LISINOPRIL 10 MG TABS (LISINOPRIL) Take 1 tablet by mouth once a day  #90 x 0   Entered by:   Adella Hare LPN   Authorized by:   Syliva Overman MD   Signed by:   Adella Hare LPN on 46/27/0350   Method used:   Faxed to ...       [facility] (mail-order)             , [place]         Ph: [phone]       Fax: [phone]   RxID:    [id number]

## 2010-05-29 NOTE — Letter (Signed)
Summary: Letter to [facility]  Letter to [facility]   Imported By: Lind Guest 04/23/2010 12:48:29  _____________________________________________________________________  External Attachment:    Type:   Image     Comment:   External Document

## 2010-06-03 ENCOUNTER — Encounter: Payer: Self-pay | Admitting: Family Medicine

## 2010-06-03 LAB — CONVERTED CEMR LAB
Bilirubin, Direct: <0.1 mg/dL (ref 0.0–0.3)
CO2: 23 meq/L (ref 19–32)
Cholesterol: 268 mg/dL — ABNORMAL HIGH (ref 0–200)
Creatinine, Ser: 0.91 mg/dL (ref 0.40–1.20)
Glucose, Bld: 78 mg/dL (ref 70–99)
HDL: 44 mg/dL (ref >39)
Potassium: 4.1 meq/L (ref 3.5–5.3)
Total CHOL/HDL Ratio: 6.1
Triglycerides: 204 mg/dL — ABNORMAL HIGH (ref <150)
VLDL: 41 mg/dL — ABNORMAL HIGH (ref 0–40)

## 2010-06-04 NOTE — Assessment & Plan Note (Signed)
Summary: office visit   Vital Signs:  Patient profile:   50 year old female Height:      63 inches Weight:      174.75 pounds BMI:     31.07 O2 Sat:      98 % on Room air Pulse rate:   65 / minute Pulse rhythm:   regular Resp:     16 per minute BP sitting:   100 / 72  (left arm)  Vitals Entered By: Adella Hare LPN (May 27, 2010 8:53 AM)  Nutrition Counseling: Patient's BMI is greater than 25 and therefore counseled on weight management options.  O2 Flow:  Room air CC: follow-up visit Is Patient Diabetic? No Comments did not bring meds to ov but states list is accurate   CC:  follow-up visit.  History of Present Illness: Reports  that she has been doing fairly well. She does believe her MS may be flaring up, she has not seen her neurologist for a while, and intends to reschedule an appt which she missed Denies recent fever or chills. Denies sinus pressure, nasal congestion , ear pain or sore throat. Denies chest congestion, or cough productive of sputum. Denies chest pain, palpitations, PND, orthopnea or leg swelling. Denies abdominal pain, nausea, vomitting, diarrhea or constipation. Denies change in bowel movements or bloody stool. Denies dysuria , frequency, incontinence or hesitancy.  Denies  vertigo or  seizures. Denies uncontrolled depression, anxiety or insomnia.Good response to meds Denies  rash, lesions, or itch.     Allergies (verified): No Known Drug Allergies  Review of Systems      See HPI General:  Complains of fatigue and sleep disorder. Eyes:  Denies blurring and discharge. Neuro:  Complains of headaches and tingling; increased mS symptoms needs to re-establish, her headaches are better. Endo:  Denies cold intolerance, excessive hunger, excessive thirst, and excessive urination. Heme:  Denies abnormal bruising and bleeding. Allergy:  Denies hives or rash and itching eyes.  Physical Exam  General:  Well-developed,well-nourished,in no acute  distress; alert,appropriate and cooperative throughout examination HEENT: No facial asymmetry,  EOMI, No sinus tenderness, TM's Clear, oropharynx  pink and moist.   Chest: Clear to auscultation bilaterally.  CVS: S1, S2, No murmurs, No S3.   Abd: Soft, Nontender.  MS: Adequate ROM spine, hips, shoulders and knees.  Ext: No edema.   CNS: CN 2-12 intact, power tone and sensation normal throughout.   Skin: Intact, no visible lesions or rashes.  Psych: Good eye contact, normal affect.  Memory intact, not anxious or depressed appearing.     Impression & Recommendations:  Problem # 1:  OBESITY, UNSPECIFIED (ICD-278.00) Assessment Improved  Ht: 63 (05/27/2010)   Wt: 174.75 (05/27/2010)   BMI: 31.07 (05/27/2010) therapeutic lifestyle change discussed and encouraged  Problem # 2:  INSOMNIA (ICD-780.52) Assessment: Improved  The following medications were removed from the medication list:    Zolpidem Tartrate 10 Mg Tabs (Zolpidem tartrate) ..... One tab by mouth at bedtime    Zolpidem Tartrate 10 Mg Tabs (Zolpidem tartrate) .Marland Kitchen... Take 1 tab by mouth at bedtime as needed Her updated medication list for this problem includes:    Zolpidem Tartrate 10 Mg Tabs (Zolpidem tartrate) ..... One at night as needed for sleep, avg use is 4 to 8 per month  Discussed sleep hygiene.   Problem # 3:  HYPERLIPIDEMIA (ICD-272.4) Assessment: Deteriorated  The following medications were removed from the medication list:    Pravastatin Sodium 40 Mg Tabs (Pravastatin sodium) .Marland KitchenMarland KitchenMarland KitchenMarland Kitchen  2 tablets at bedtime discontinue crestor Her updated medication list for this problem includes:    Pravastatin Sodium 80 Mg Tabs (Pravastatin sodium) .Marland Kitchen... Take 1 tab by mouth at bedtime  Orders: T-Hepatic Function [id number]) T-Lipid Profile [id number]) T-Hepatic Function [id number]) T-Lipid Profile [phone])  Labs Reviewed: SGOT: 31 (12/16/2009)   SGPT: 22 (12/16/2009)   HDL:41 (12/16/2009), 40 (04/03/2008)   LDL:194 (12/16/2009), 161 (04/03/2008)  Chol:266 (12/16/2009), 254 (04/03/2008)  Trig:157 (12/16/2009), 265 (04/03/2008)  Problem # 4:  HYPERTENSION (ICD-401.9) Assessment: Improved  The following medications were removed from the medication list:    Lisinopril 10 Mg Tabs (Lisinopril) .Marland Kitchen... Take 1 tablet by mouth once a day Her updated medication list for this problem includes:    Lisinopril 5 Mg Tabs (Lisinopril) .Marland Kitchen... Take 1 tablet by mouth once a day dose reduction effective 05/27/2010  Orders: T-Basic Metabolic Panel [id number]) T-Basic Metabolic Panel [id number])  BP today: 100/72 Prior BP: 130/84 (12/16/2009)  Labs Reviewed: K+: 4.0 (12/16/2009) Creat: : 0.89 (12/16/2009)   Chol: 266 (12/16/2009)   HDL: 41 (12/16/2009)   LDL: 194 (12/16/2009)   TG: 157 (12/16/2009)  Complete Medication List: 1)  Fluoxetine Hcl 20 Mg Caps (Fluoxetine hcl) .... Take 1 capsule by mouth once a day 2)  Topiramate 50 Mg Tabs (Topiramate) .... Take 1 tablet by mouth two times a day 3)  Promethazine Hcl 25 Mg Tabs (Promethazine hcl) .... Take 1 tablet by mouth two times a day as needed severe nausea 4)  Ibuprofen 800 Mg Tabs (Ibuprofen) .... Take 1 tablet by mouth two times a day as needed severe headache 5)  Zolpidem Tartrate 10 Mg Tabs (Zolpidem tartrate) .... One at night as needed for sleep, avg use is 4 to 8 per month 6)  Lisinopril 5 Mg Tabs (Lisinopril) .... Take 1 tablet by mouth once a day dose reduction effective 05/27/2010 7)  Pravastatin Sodium 80 Mg Tabs (Pravastatin sodium) .... Take 1 tab by mouth at bedtime  Patient Instructions: 1)  Please schedule a follow-up appointment in 4 to 4.5 months. 2)  It is important that you exercise regularly at least 30 minutes 5 times a week. If you develop chest pain, have severe difficulty breathing, or feel very tired , stop exercising immediately and seek medical attention. 3)  You need to lose weight. Consider a lower calorie diet and regular  exercise.  4)  BMP prior to visit, ICD-9: 5)  Hepatic Panel prior to visit, ICD-9:  fasting today 6)  Lipid Panel prior to visit, ICD-9: 7)  dOSE reduction in your bP  med, your BP is 100/70 8)  BMP prior to visit, ICD-9: 9)  Hepatic Panel prior to visit, ICD-9:   fasting in 4.5 months 10)  Lipid Panel prior to visit, ICD-9: 11)  Schedule a colonoscopy/sigmoidoscopy to help detect colon cancer. 12)  You need to have a Pap Smear to prevent cervical cancer. Prescriptions: PRAVASTATIN SODIUM 80 MG TABS (PRAVASTATIN SODIUM) Take 1 tab by mouth at bedtime  #30 x 4   Entered and Authorized by:   Syliva Overman MD   Signed by:   Syliva Overman MD on 05/28/2010   Method used:   Historical   RxID:   [id number] LISINOPRIL 5 MG TABS (LISINOPRIL) Take 1 tablet by mouth once a day dose reduction effective 05/27/2010  #90 x 1   Entered and Authorized by:   Syliva Overman MD   Signed by:   Syliva Overman MD on 05/27/2010   Method used:  Printed then faxed to ...       [facility]  [address] (retail)       [address]       [place], [place]  [address]       Ph: [phone]       Fax: [phone]   RxID:   [id number]    Orders Added: 1)  Est. Patient Level IV [40102] 2)  T-Basic Metabolic Panel [id number] 3)  T-Hepatic Function [80076-22960] 4)  T-Lipid Profile [80061-22930] 5)  T-Basic Metabolic Panel [80048-22910] 6)  T-Hepatic Function [80076-22960] 7)  T-Lipid Profile [[id number]]

## 2010-06-12 NOTE — Letter (Signed)
Summary: Letter  Letter   Imported By: Lind Guest 06/04/2010 11:02:42  _____________________________________________________________________  External Attachment:    Type:   Image     Comment:   External Document

## 2010-09-09 NOTE — Letter (Signed)
December 19, 2007    Milus Mallick. Lodema Hong, MD  [address]  [place], [place] [address]   RE:  Pamela Walters, Pamela Walters  MRN:  [id number]  /  DOB:  Dec 26, 1960   Dear Claris Che,   Pamela Walters returns to the office for continued assessment and treatment  of chest discomfort, hypertension, and dyslipidemia.  She feels a good  deal better than at her last visit.  She attributes this to better  control of hypertension.  Systolics have been in the 90s and low 100s  with diastolics of 60-70.  A stress nuclear study was negative.  She had  a repeat lipid profile after starting ezetimibe 10 mg daily with a  dramatic improvement.   Current medications include Maxzide 1.5 tablets daily, Norvasc 10 mg  daily, simvastatin 40 mg daily, Prozac 20 mg daily, Topamax 100 mg  b.i.d., Betaseron injection every other day, multivitamin, aspirin,  lisinopril 20 mg daily, and ezetimibe 10 mg daily.   On exam, very pleasant woman in no acute distress.  The blood pressure  is 95/70, heart rate 70 and regular, respirations 14, and weight 179,  unchanged.  Neck, no jugular venous distention; normal carotid upstrokes  without bruits.  Lungs, clear.  Cardiac, normal first and second heart  sounds.  Extremities, trace edema.   IMPRESSION:  Pamela Walters is doing beautifully.  Hypertension and  hyperlipidemia are controlled.  Her symptoms have resolved and probably  did not reflect serious underlying pathology.  I will leave further  management to your discretion.  Please call me at any time that I can  assist in the care of this very nice woman.    Sincerely,      Gerrit Friends. Dietrich Pates, MD, [facility]  Electronically Signed    RMR/MedQ  DD: 12/19/2007  DT: 12/20/2007  Job #: [id number]

## 2010-09-09 NOTE — Letter (Signed)
November 21, 2007    Pamela Walters. Pamela Walters, M.D.  [address]  [place], [place] [address]   RE:  Pamela Walters, Pamela Walters  MRN:  [id number]  /  DOB:  16-Mar-1961   Dear Pamela Walters:   It is my pleasure evaluating Pamela Walters in the office today in  consultation at your request.  As you know, this nice woman has recently  had chest discomfort.  She describes classic angina with exertional  chest tightness, relieved with rest.  There is no associated dyspnea nor  diaphoresis.  Symptoms typically last a minute or so.  She also has had  diaphoretic spells at night that she attributes to menopause.  She had  previous hysterectomy, but not oophorectomy.  She has hypertension that  has been difficult to control with medical therapy.  She also has  dyslipidemia for which she has been treated with a number of different  statin drugs.   Past medical history is otherwise notable for multiple sclerosis,  diagnosed 2 years ago.  She has had quite mild symptoms, but apparently  has MRI verification of the diagnosis.   CURRENT MEDICATIONS:  1. Maxzide 1-1/2 tablets daily.  2. Amlodipine 10 mg daily.  3. Rosuvastatin 40 mg daily.  4. Prozac 20 mg daily.  5. Topamax 100 mg b.i.d.  6. Betaseron by injection q.o.d.  7. Multivitamin.  8. Aspirin 81 mg daily.   ALLERGIES:  She has no known drug allergies.   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  Married with two adult  children.   FAMILY HISTORY:  Father died due to alcohol abuse.  Mother experienced  fatality related to renal aneurysm.  She has one sister who has  hypertension.   REVIEW OF SYSTEMS:  Positive for migraines, occasional dizziness, the  need for corrective lenses, palpitations, and a regular diet and stable  weight and appetite.  All other systems reviewed and are negative.   PHYSICAL EXAMINATION:  GENERAL:  Pleasant overweight woman in no acute  distress.  VITAL SIGNS:  The weight is 174, blood pressure 125/90, heart rate 95  and regular, and respirations 14.  HEENT:  Anicteric sclerae; normal lids and conjunctivae; and normal oral  mucosa.  NECK:  No jugular venous distention; normal carotid upstrokes without  bruits.  ENDOCRINE:  No thyromegaly.  HEMATOPOIETIC:  No adenopathy.  SKIN:  No significant lesions.  LUNGS:  Clear.  CARDIAC:  Normal first and second heart sounds.  ABDOMEN:  Soft and nontender; no organomegaly.  EXTREMITIES:  No edema; normal distal pulses.  NEUROLOGIC:  Nonfocal.   EKG:  Normal sinus rhythm; delayed R-wave progression; minor nonspecific  ST-T wave abnormality; minimal voltage criteria for LVH.  No prior  tracings available for comparison.   LABORATORY DATA:  Laboratory studies were retrieved.  Chemistry profile  was normal as was CBC.  Lipid profile was quite abnormal with total  cholesterol of 275, triglycerides 193, HDL of 43, and LDL of 193.   IMPRESSION:  Pamela Walters has worrisome symptoms in the setting of  substantial cardiovascular risk due to hypertension and hyperlipidemia  and a positive family history.  She certainly merits stress testing,  which will be performed with Myoview imaging.  We will add  lisinopril for better control of blood pressure and ezetimibe for  treatment of hyperlipidemia.  Since she has a very abnormal lipid  profile on high dose Crestor,  she probably will require 3 or 4 drugs for  reasonable control of her dyslipidemia.  I will let you know the results  of her stress test as soon as it has been completed.  Thanks for sending  this nice lady to see me.    Sincerely,      Pamela Friends. Dietrich Pates, MD, [facility]  Electronically Signed    RMR/MedQ  DD: 11/21/2007  DT: 11/22/2007  Job #: [id number]

## 2010-09-12 NOTE — Procedures (Signed)
NAME:  Pamela Walters, Pamela Walters              ACCOUNT NO.:  [id number]   MEDICAL RECORD NO.:  [id number]          PATIENT TYPE:  OUT   LOCATION:  RAD                           FACILITY:  [facility]   PHYSICIAN:  Willa Rough, M.D.     DATE OF BIRTH:  02-Apr-1961   DATE OF PROCEDURE:  08/04/2005  DATE OF DISCHARGE:                                  ECHOCARDIOGRAM   The patient has had palpitations and hypertension and this study is done for  further evaluation.   2-D ECHOCARDIOGRAM:  1.  Aorta:  30 mm.  2.  Aortic valve:  Normal.  3.  Left atrium:  28 mm.  4.  Mitral valve:  Grossly normal.  5.  Left ventricle:  End-diastolic dimension 41 mm and systolic dimension 33      mm.  Wall thickness is 10 mm.  Wall motion is normal.  The ejection      fraction is 60%.  6.  Right ventricle:  Normal.  7.  Tricuspid valve:  Normal.  8.  Pulmonic valve:  Not well seen.  9.  Pericardial effusion:  There is no significant effusion seen.   DOPPLER ANALYSIS:  Reveals no significant abnormalities.   IMPRESSION:  1.  Normal systolic left ventricular function.  2.  No significant valvular abnormalities.           ______________________________  Willa Rough, M.D.     JK/MEDQ  D:  08/04/2005  T:  08/04/2005  Job:  [id number]   cc:   Lodema Hong, M.D.   Sheffield Slider, M.D.

## 2010-09-12 NOTE — Op Note (Signed)
NAME:  Pamela Walters, Pamela Walters                        ACCOUNT NO.:  [id number]   MEDICAL RECORD NO.:  [id number]                   PATIENT TYPE:  AMB   LOCATION:  DFTL                                 FACILITY:  [facility]   PHYSICIAN:  Malachi Pro. Ambrose Mantle, M.D.              DATE OF BIRTH:  [birth year]   DATE OF PROCEDURE:  04/10/2002  DATE OF DISCHARGE:                                 OPERATIVE REPORT   PREOPERATIVE DIAGNOSIS:  Menorrhagia, dysmenorrhea, abnormal uterine  bleeding, anemia, probable leiomyomata uteri and possible adenomyosis.   POSTOPERATIVE DIAGNOSIS:  Menorrhagia, dysmenorrhea, abnormal uterine  bleeding, anemia, probable leiomyomata uteri and possible adenomyosis.   OPERATION PERFORMED:  D&C, vaginal hysterectomy.   SURGEON:  Malachi Pro. Ambrose Mantle, M.D.   ASSISTANT:  Leone Haven   ANESTHESIA:  General.   DESCRIPTION OF PROCEDURE:  The patient was brought to the operating room and  placed under satisfactory general anesthesia.  Exam revealed the uterus to  be third degree retroverted, probably two times normal size.  The adnexa  were free of masses.  The cul-de-sac felt mobile.  The vulva, vagina,  perineum and urethra were prepped with Betadine solution and draped as a  sterile field.  The cervix was grasped with two Lahey clamps, drawn in to  the operative field and a D&C was done without any dilatation of the cervix  and the tissue was sent for frozen section.  The cervicovaginal junction was  then injected with a dilute solution of Neo-Synephrine.  A circumferential  incision was made around the cervix.  The bladder was pushed anteriorly.  The posterior cul-de-sac was identified and entered.  Both uterosacral  ligaments were clamped cut and suture ligated and held.  The cardinal  ligaments were clamped, cut and suture ligated as were the parametrial  tissues above the cardinal ligaments.  I then identified the anterior  peritoneum, entered it, retracted the bladder  away, combined the posterior  and anterior peritoneal leaves and suture ligated the pedicles.  After one  more bite on each side.  I did invert the uterus through the incision in the  cul-de-sac but it was so large that I did not want to use that much tissue  in my clamps, so I replaced the uterus, continued to work up the sides of  the uterus, then inverted the uterus through the incision in the cul-de-sac,  crossclamped the upper pedicles, removed the uterus and doubly suture  ligated the upper pedicles.  The right ovary was normal sized.  It was  easily visible.  I did not see the left ovary.  Both tubes appeared normal.  I controlled the bleeding by suturing the posterior vaginal cuff to the  peritoneum and a couple of sutures on each side of the broad ligament on the  right and left broad ligaments were necessary for hemostasis.  I then placed  a pursestring suture of #1 Vicryl  through the anterior peritoneum, the left  upper pedicle, the left uterosacral ligament, posterior peritoneum, right  uterosacral ligament, right upper pedicle and tied this down being confident  that there was no bleeding inside the peritoneal cavity.  I then cut away  the upper pedicles, sutured the uterosacral ligaments together in the  midline, extraperitoneally and then closed the vaginal mucosa with  interrupted figure-of-eight sutures of 0 Vicryl.  Blood loss was about 200  cc.  Sponge and needle counts were correct.  It should be noted that prior  to closing the peritoneal  cavity, I did inflate the bladder with methylene blue stained fluid and  there was no leakage.  The procedure was terminated, the patient returned to  recovery in satisfactory condition.                                                Malachi Pro. Ambrose Mantle, M.D.    TFH/MEDQ  D:  04/10/2002  T:  04/10/2002  Job:  [id number]

## 2010-09-12 NOTE — Discharge Summary (Signed)
   NAME:  Pamela Walters, Pamela Walters                        ACCOUNT NO.:  [id number]   MEDICAL RECORD NO.:  [id number]                   PATIENT TYPE:  OBV   LOCATION:  0456                                 FACILITY:  [facility]   PHYSICIAN:  Malachi Pro. Ambrose Mantle, M.D.              DATE OF BIRTH:  1960-09-30   DATE OF ADMISSION:  04/10/2002  DATE OF DISCHARGE:  04/11/2002                                 DISCHARGE SUMMARY   HISTORY OF PRESENT ILLNESS:  The patient is a 50 year old white female who  is admitted to the hospital with the diagnoses of menorrhagia, dysmenorrhea,  abnormal uterine bleeding, leiomyomata uteri, and anemia for D&C and vaginal  hysterectomy.   HOSPITAL COURSE:  The patient underwent a D&C, frozen section was benign,  and a vaginal hysterectomy under general anesthesia on 04/10/02 by Dr.  Ambrose Mantle with Dr. Senaida Ores assisting.  Blood loss was about 200 cc.  Postoperatively, the patient did well.  She ambulated well without  difficulty.  She voided well.  After her catheter was removed, her output  was excellent.  She had no vaginal bleeding.  She tolerated liquids and some  diet without problems.  At 2:15 p.m. on the day after surgery she was ready  for discharge.  Her abdomen was soft and nontender.  Her white count on  admission was 6600, hemoglobin 11.4, hematocrit 34.5, MCV 73.5, platelet  count 415,000, 68 segs, 25 lymphs, 5 monos, 1 eosinophil, 1 basophil.  Comprehensive metabolic profile was normal except for a glucose of 113, and  a potassium of 3.1.  This was done several days before the surgery.  She had  some potassium supplementation and her preoperatively her potassium was 3.6.  Urinalysis was negative.  Followup hematocrits were 31.7 and 29.9.  Pathology report pending.   FINAL DIAGNOSES:  1. Menorrhagia.  2. Dysmenorrhea.  3. Abnormal uterine bleeding.  4. Leiomyomata uteri.  5. Anemia.   OPERATION:  D&C and vaginal hysterectomy.   CONDITION ON DISCHARGE:   Improved.   DIET:  Concentrate on liquids, eat some food, make sure she stays hydrated.   DISCHARGE INSTRUCTIONS:  Call with any fever greater then 100.4 degrees.  Call with any heavy vaginal bleeding.  Avoid vaginal entrance.  Avoid heavy  lifting and strenuous activity.   FOLLOWUP:  Return to the office in 10 to 14 days.   DISCHARGE MEDICATIONS:  Mepergan Fortis 16 tablets one q.4-6h. p.r.n. pain  is given at discharge.                                                Malachi Pro. Ambrose Mantle, M.D.   TFH/MEDQ  D:  04/11/2002  T:  04/11/2002  Job:  [id number]

## 2010-09-12 NOTE — H&P (Signed)
NAME:  Pamela Walters, Pamela Walters                        ACCOUNT NO.:  [id number]   MEDICAL RECORD NO.:  [id number]                   PATIENT TYPE:  AMB   LOCATION:  DFTL                                 FACILITY:  [facility]   PHYSICIAN:  Malachi Pro. Ambrose Mantle, M.D.              DATE OF BIRTH:  Sep 03, 1960   DATE OF ADMISSION:  04/10/2002  DATE OF DISCHARGE:                                HISTORY & PHYSICAL   HISTORY OF PRESENT ILLNESS:  The patient is a 50 year old white female, para  3-0-0-2, who is admitted to the hospital for hysterectomy because of severe  menorrhagia, severe dysmenorrhea, abnormal uterine bleeding, and a history  of anemia to 8.6 g.  Last menstrual period was on 03/17/02, for 14 days,  heavy with clots.  Previous period had begun in early August and lasted  until I saw her on 02/16/02, at which time she reported using three pads at  a time with clots as big as a hamburger, hurting a lot when she passed  clots, and was unable to work on the day we saw her.  Her hemoglobin in the  office was 7.9, she did not have orthostatic changes.  Hemoglobin was  confirmed at [facility] at 8.6, with a hematocrit of 26.7, and indices  suggestive of iron-deficiency anemia.  Endometrial biopsy had been benign in  4/02.  I gave her Provera 10 mg q.d. x28 days, and asked her to call if the  abnormal bleeding persisted.  The bleeding stopped within a short period of  time, but when she came off of the Provera had the period listed on  03/17/02.  She has had problems with abnormal uterine bleeding for some  time, and as stated was evaluated in 2002 with an endometrial biopsy.   ALLERGIES:  No known drug allergies.   PAST MEDICAL HISTORY:  No illnesses.  No heart problems.   MEDICATIONS:  Prozac.   HABITS:  No alcohol, tobacco.   REVIEW OF SYMPTOMS:  Occasional headaches.   FAMILY HISTORY:  Mother died at 53 of an aneurysm.  Father died at 65 of  heart problems.  One sister living and well, no  brothers.   PHYSICAL EXAMINATION:  GENERAL:  A well-developed, well-nourished white  female in no acute distress.  VITAL SIGNS:  Weight is 192 pounds, pulse is 72, blood pressure 170/100.  HEENT:  No cranial abnormalities.  Extraocular movements were intact.  Nose  and pharynx are clear.  NECK:  Supple without thyromegaly.  HEART:  Normal size and sounds.  No murmurs.  LUNGS:  Clear to auscultation and percussion.  BREASTS:  Soft, without masses sitting up or laying down.  ABDOMEN:  Soft, nontender, no masses are palpable.  Liver, spleen, and  kidneys are not felt.  PELVIC:  Vulva and vagina are clean.  BUS negative.  Cervix is clean, but  bulky.  Uterus is posterior, approximately two times  normal size.  Adnexa  are free of masses.  Rectovaginal confirms the above findings.  The cul-de-  sac does not feel thickened.   LABORATORY DATA:  An ultrasound done in 1998, suggested a fibroid that might  have a submucosal component.    ADMITTING IMPRESSION:  1. Menorrhagia.  2. Dysmenorrhea.  3. Abnormal uterine bleeding.  4. History of anemia.  5. Probable adenomyosis.  6. Leiomyomata uteri.   PLAN:  The patient is admitted for vaginal hysterectomy.  I plan to do a D&C  prior to the hysterectomy to send the tissue for frozen section.  The  patient has been informed of the risks of surgery, including, but not  limited to heart attack, stroke, thrombophlebitis, pulmonary embolism, wound  disruption, hemorrhage with need for re-operation and/or transfusion,  fistula formation, nerve injury, and intestinal obstruction.  Because of the  bleeding problems, she has not been having much intercourse, but she does  feel like she has a normal sex drive of at least two to three times a week  if she were not bleeding.  She has also been informed that the surgery might  have an unpredictable impact on her sex drive.  She is ready to proceed.                                                Malachi Pro. Ambrose Mantle, M.D.    TFH/MEDQ  D:  04/09/2002  T:  04/09/2002  Job:  [id number]

## 2010-09-17 ENCOUNTER — Encounter: Payer: Self-pay | Admitting: Family Medicine

## 2010-09-18 ENCOUNTER — Encounter: Payer: Self-pay | Admitting: Family Medicine

## 2010-09-23 ENCOUNTER — Ambulatory Visit: Payer: Self-pay | Admitting: Family Medicine

## 2013-01-30 ENCOUNTER — Ambulatory Visit (INDEPENDENT_AMBULATORY_CARE_PROVIDER_SITE_OTHER): Payer: BC Managed Care – PPO | Admitting: Family Medicine

## 2013-01-30 ENCOUNTER — Encounter: Payer: Self-pay | Admitting: Family Medicine

## 2013-01-30 VITALS — BP 170/104 | HR 86 | Resp 16 | Ht 63.0 in | Wt 185.0 lb

## 2013-01-30 DIAGNOSIS — E785 Hyperlipidemia, unspecified: Secondary | ICD-10-CM

## 2013-01-30 DIAGNOSIS — M549 Dorsalgia, unspecified: Secondary | ICD-10-CM

## 2013-01-30 DIAGNOSIS — M542 Cervicalgia: Secondary | ICD-10-CM

## 2013-01-30 DIAGNOSIS — E669 Obesity, unspecified: Secondary | ICD-10-CM

## 2013-01-30 DIAGNOSIS — Z1211 Encounter for screening for malignant neoplasm of colon: Secondary | ICD-10-CM

## 2013-01-30 DIAGNOSIS — M25561 Pain in right knee: Secondary | ICD-10-CM

## 2013-01-30 DIAGNOSIS — F3289 Other specified depressive episodes: Secondary | ICD-10-CM

## 2013-01-30 DIAGNOSIS — N390 Urinary tract infection, site not specified: Secondary | ICD-10-CM

## 2013-01-30 DIAGNOSIS — I10 Essential (primary) hypertension: Secondary | ICD-10-CM

## 2013-01-30 DIAGNOSIS — R5381 Other malaise: Secondary | ICD-10-CM

## 2013-01-30 DIAGNOSIS — Z139 Encounter for screening, unspecified: Secondary | ICD-10-CM

## 2013-01-30 DIAGNOSIS — F329 Major depressive disorder, single episode, unspecified: Secondary | ICD-10-CM

## 2013-01-30 DIAGNOSIS — G35 Multiple sclerosis: Secondary | ICD-10-CM

## 2013-01-30 DIAGNOSIS — M25569 Pain in unspecified knee: Secondary | ICD-10-CM

## 2013-01-30 LAB — POCT URINALYSIS DIPSTICK
Ketones, UA: NEGATIVE
Protein, UA: 30
Spec Grav, UA: 1.02
Urobilinogen, UA: 0.2
pH, UA: 6.5

## 2013-01-30 LAB — CBC WITH DIFFERENTIAL/PLATELET
Basophils Absolute: 0 10*3/uL (ref 0.0–0.1)
Basophils Relative: 1 % (ref 0–1)
Eosinophils Absolute: 0.1 10*3/uL (ref 0.0–0.7)
Eosinophils Relative: 1 % (ref 0–5)
HCT: 38.3 % (ref 36.0–46.0)
Hemoglobin: 13.2 g/dL (ref 12.0–15.0)
Lymphocytes Relative: 24 % (ref 12–46)
Lymphs Abs: 1.7 10*3/uL (ref 0.7–4.0)
MCHC: 34.5 g/dL (ref 30.0–36.0)
Monocytes Absolute: 0.6 10*3/uL (ref 0.1–1.0)
Monocytes Relative: 8 % (ref 3–12)
Neutro Abs: 4.5 10*3/uL (ref 1.7–7.7)

## 2013-01-30 LAB — COMPREHENSIVE METABOLIC PANEL
AST: 19 U/L (ref 0–37)
Alkaline Phosphatase: 87 U/L (ref 39–117)
CO2: 29 mEq/L (ref 19–32)
Creat: 0.83 mg/dL (ref 0.50–1.10)
Glucose, Bld: 99 mg/dL (ref 70–99)
Sodium: 139 mEq/L (ref 135–145)

## 2013-01-30 LAB — TSH: TSH: 2.078 u[IU]/mL (ref 0.350–4.500)

## 2013-01-30 LAB — LIPID PANEL: Total CHOL/HDL Ratio: 6.4 Ratio

## 2013-01-30 MED ORDER — PREDNISONE 5 MG PO TABS: 5.0000 mg | ORAL_TABLET | Freq: Two times a day (BID) | ORAL | Status: AC

## 2013-01-30 MED ORDER — HYDROCODONE-ACETAMINOPHEN 5-325 MG PO TABS: ORAL_TABLET | ORAL | Status: AC

## 2013-01-30 MED ORDER — CYCLOBENZAPRINE HCL 10 MG PO TABS: ORAL_TABLET | ORAL | Status: DC

## 2013-01-30 MED ORDER — METHYLPREDNISOLONE ACETATE 80 MG/ML IJ SUSP
80.0000 mg | Freq: Once | INTRAMUSCULAR | Status: AC
  Administered 2013-01-30: 80 mg via INTRAMUSCULAR

## 2013-01-30 MED ORDER — CIPROFLOXACIN HCL 500 MG PO TABS: 500.0000 mg | ORAL_TABLET | Freq: Two times a day (BID) | ORAL | Status: AC

## 2013-01-30 MED ORDER — KETOROLAC TROMETHAMINE 60 MG/2ML IM SOLN
60.0000 mg | Freq: Once | INTRAMUSCULAR | Status: AC
  Administered 2013-01-30: 60 mg via INTRAMUSCULAR

## 2013-01-30 MED ORDER — FLUOXETINE HCL 20 MG PO CAPS: 20.0000 mg | ORAL_CAPSULE | Freq: Every day | ORAL | Status: DC

## 2013-01-30 MED ORDER — TRIAMTERENE-HCTZ 37.5-25 MG PO TABS: 1.0000 | ORAL_TABLET | Freq: Every day | ORAL | Status: DC

## 2013-01-30 NOTE — Progress Notes (Signed)
  Subjective:    Patient ID: Pamela Walters, female    DOB: 27-Nov-1960, 52 y.o.   MRN: [id number]  HPI Pt in for resumption of care. States that since the Spring, she has been experiencing excruciating pain in lower posterior chest area, paraspinal rated at a 10. Initially woke her from her sleep. When it occurs she needs help to  Walk, and as a result of this has been wearing incontinence pads. Occurs at least once per week, last Wednesday lasted for 6 hrs through the night, muscle relaxant of no relief, narcotic pain med helped her to sleep. Feels like a spasm, non radiating and localized. No known back problems. Pt has  Been dxx with MS in 7 years ago and has not been evaluated by neurology for approx 3 years. Since this time she notes worsening left vision, esp when tired this is evident, she has cut back her work schedule, now only prn basis, but sometimes as much 40 hours. Has been following opthalmology regularly and already has appt scheduled. Has noted increased fatigue, generalized weakness and chronic  Underlying  Generalized pain, esp both lower extremities, experience a deep pain , rated a t a 6 most of the time to the toes, and has had episodes of  Acute weakness , has saved herself from falling several times by grabbing onto something, esp on the job where she is fatigued   Review of Systems    See HPI Denies recent fever or chills. Denies , ear pain or sore throat.sinus pressure and nasal congestion due to allergies in the past several weeks. No fever , chills , yellow drainage, thinks due to allergies, good response to allegra generally Denies chest congestion, productive cough or wheezing. Denies chest pains, palpitations and leg swelling Denies abdominal pain, nausea, vomiting,diarrhea , notes constipation for several years, needs laxative every 3 days. Admit could improve with diet, but denies change in stool caliber, no f/h of colon ca, ready for hers which is past due   C/o 1  month h/o dysuria, frequency,  no flank pain Right knee pain medial x 6 months, gets swollen, and there is instability Intermittent headaches, denies seizures, numbness, or tingling.Noteds reduced vision in left eye C/o depression and increased  Anxiety, no real  insomnia. Denies skin break down or rash. 4 month h/o mid and low back pain radiating to lower extremities,  Unable to walk without assistance when severe, unsure if re;lated to her MS or due back disease     Objective:   Physical Exam  Patient alert and oriented and in no cardiopulmonary distress.  HEENT: No facial asymmetry, EOMI, no sinus tenderness,  oropharynx pink and moist.  Neck supple no adenopathy.  Chest: Clear to auscultation bilaterally.No reproducible chest wall tenderness  CVS: S1, S2 no murmurs, no S3.  ABD: Soft non tender. Bowel sounds normal.  Ext: No edema  MS: decreased ROM thoracic and lumbar spines, spasm of muscles in thoracic area, adequate ROM shoulders, hips and knees.  Skin: Intact, no ulcerations or rash noted.  Psych: Good eye contact, normal affect. Memory intact mildly  anxious and  depressed appearing.  CNS: CN 2-12 intact, power,  normal throughout.       Assessment & Plan:

## 2013-01-30 NOTE — Patient Instructions (Addendum)
F/u in 4.5 weeks, call if you need me before  Labs today fasting, cbc, lipid, cmp, TSh and vit D  You will be referred for brain scan and scan of thoracic and lumbar spine  You will be referred to neurologist with [facility] per your request.  Blood pressure elevated,new  medication is maxzide 25mg  one daily, discontinue lisinopril  Toradol 60mg  and depo medrol 80 mg Im in the office today for back pain followed by prednisone for 5 days. Flexeril at bedtime , for back spasm and vicodin one tablet at bedtime for uncontrolled pain, 15 tablets to last 5 weeks , script will be given  For depression start fluoxetine 20mg  daily  You appear to have a UTI, cipro is prescribed for 5 days and the specimen sent for culture  We will contact you re referral dates and re labs  You will be referred to ortho in [place] re right knee pain and instability, xray /imaging study at orthopedic office

## 2013-02-01 MED ORDER — PRAVASTATIN SODIUM 80 MG PO TABS: 80.0000 mg | ORAL_TABLET | Freq: Every day | ORAL | Status: DC

## 2013-02-01 NOTE — Assessment & Plan Note (Signed)
Uncontrolled, needs to take statin Hyperlipidemia:Low fat diet discussed and encouraged.

## 2013-02-05 DIAGNOSIS — M549 Dorsalgia, unspecified: Secondary | ICD-10-CM | POA: Insufficient documentation

## 2013-02-05 DIAGNOSIS — M25561 Pain in right knee: Secondary | ICD-10-CM | POA: Insufficient documentation

## 2013-02-05 NOTE — Assessment & Plan Note (Signed)
Worsening right knee pain and instability x months. Ortho to eval and treat

## 2013-02-05 NOTE — Assessment & Plan Note (Signed)
Uncpontrolled, not suicidal or homicidal pt to start med, has been treated in the past

## 2013-02-05 NOTE — Assessment & Plan Note (Signed)
worsening symptoms per patient. Has been on no treatment and has not seen neurologist for over 2 years. Wishes to be seen by neurologist at [facility], will refer per pt request

## 2013-02-05 NOTE — Assessment & Plan Note (Signed)
4 month h/o mid and  low back pain radiating to left lower extremity worsening, paralyzes pt at t its peak,increasingly causing limitation in function

## 2013-02-05 NOTE — Assessment & Plan Note (Signed)
Uncontrolled, pt to start medication. Discontinue lisinopril DASH diet and commitment to daily physical activity for a minimum of 30 minutes discussed and encouraged, as a part of hypertension management. The importance of attaining a healthy weight is also discussed.

## 2013-02-05 NOTE — Assessment & Plan Note (Signed)
Abnormal ua, stated antibiotic presumptively, urine sent for c/s will f/u

## 2013-02-08 ENCOUNTER — Other Ambulatory Visit: Payer: Self-pay | Admitting: Family Medicine

## 2013-02-08 ENCOUNTER — Telehealth: Payer: Self-pay | Admitting: Family Medicine

## 2013-02-08 MED ORDER — LORAZEPAM 2 MG PO TABS: ORAL_TABLET | ORAL | Status: DC

## 2013-02-08 NOTE — Telephone Encounter (Signed)
Patient is having her MRI of the brain in the morning at [facility] then the other two will be done Monday Please send in something to help her through this she stated last time she went

## 2013-02-08 NOTE — Telephone Encounter (Signed)
msg left for patient that rx was sent to [facility]

## 2013-02-08 NOTE — Telephone Encounter (Signed)
Ativan printed, pls send and let pt know

## 2013-02-08 NOTE — Telephone Encounter (Signed)
Patient is having her MRI of the brain in the morning at [facility] then the other two will be done Monday Please send in something to help her through this she stated last time she went   She had a fit and Dr. Had gave her something and it worked she needs this again for the 3 test (MRI) please send to [facility] in [place]

## 2013-02-09 ENCOUNTER — Ambulatory Visit (HOSPITAL_COMMUNITY)
Admission: RE | Admit: 2013-02-09 | Discharge: 2013-02-09 | Disposition: A | Discharge status: Home / Self Care | Payer: BC Managed Care – PPO | Source: Ambulatory Visit | Attending: Family Medicine | Admitting: Family Medicine

## 2013-02-09 DIAGNOSIS — R51 Headache: Secondary | ICD-10-CM | POA: Insufficient documentation

## 2013-02-09 DIAGNOSIS — G35 Multiple sclerosis: Secondary | ICD-10-CM | POA: Insufficient documentation

## 2013-02-09 DIAGNOSIS — H538 Other visual disturbances: Secondary | ICD-10-CM | POA: Insufficient documentation

## 2013-02-13 ENCOUNTER — Ambulatory Visit (HOSPITAL_COMMUNITY)
Admission: RE | Admit: 2013-02-13 | Discharge: 2013-02-13 | Disposition: A | Discharge status: Home / Self Care | Payer: BC Managed Care – PPO | Source: Ambulatory Visit | Attending: Family Medicine | Admitting: Family Medicine

## 2013-02-13 ENCOUNTER — Encounter (HOSPITAL_COMMUNITY)
Admission: RE | Admit: 2013-02-13 | Discharge: 2013-02-13 | Disposition: A | Discharge status: Home / Self Care | Payer: BC Managed Care – PPO | Source: Ambulatory Visit | Attending: Family Medicine | Admitting: Family Medicine

## 2013-02-13 DIAGNOSIS — M545 Low back pain, unspecified: Secondary | ICD-10-CM | POA: Insufficient documentation

## 2013-02-13 DIAGNOSIS — M129 Arthropathy, unspecified: Secondary | ICD-10-CM | POA: Insufficient documentation

## 2013-02-13 DIAGNOSIS — M549 Dorsalgia, unspecified: Secondary | ICD-10-CM

## 2013-02-13 DIAGNOSIS — M546 Pain in thoracic spine: Secondary | ICD-10-CM | POA: Insufficient documentation

## 2013-03-02 ENCOUNTER — Telehealth: Payer: Self-pay

## 2013-03-02 NOTE — Telephone Encounter (Signed)
Pt was referred by Dr. Lodema Hong for screening colonoscopy. ( needs ov due to meds). LMOM for a return call.

## 2013-03-08 ENCOUNTER — Encounter: Payer: Self-pay | Admitting: Gastroenterology

## 2013-03-15 ENCOUNTER — Encounter: Payer: Self-pay | Admitting: General Practice

## 2013-03-16 ENCOUNTER — Ambulatory Visit: Payer: BC Managed Care – PPO | Admitting: Gastroenterology

## 2013-03-17 ENCOUNTER — Encounter: Payer: Self-pay | Admitting: Family Medicine

## 2013-03-17 ENCOUNTER — Ambulatory Visit (INDEPENDENT_AMBULATORY_CARE_PROVIDER_SITE_OTHER): Payer: BC Managed Care – PPO | Admitting: Family Medicine

## 2013-03-17 ENCOUNTER — Other Ambulatory Visit: Payer: Self-pay | Admitting: Family Medicine

## 2013-03-17 VITALS — BP 136/82 | HR 79 | Resp 16 | Ht 63.0 in | Wt 182.4 lb

## 2013-03-17 DIAGNOSIS — G35 Multiple sclerosis: Secondary | ICD-10-CM

## 2013-03-17 DIAGNOSIS — Z23 Encounter for immunization: Secondary | ICD-10-CM

## 2013-03-17 DIAGNOSIS — M549 Dorsalgia, unspecified: Secondary | ICD-10-CM

## 2013-03-17 DIAGNOSIS — E669 Obesity, unspecified: Secondary | ICD-10-CM

## 2013-03-17 DIAGNOSIS — I10 Essential (primary) hypertension: Secondary | ICD-10-CM

## 2013-03-17 DIAGNOSIS — M25561 Pain in right knee: Secondary | ICD-10-CM

## 2013-03-17 DIAGNOSIS — E785 Hyperlipidemia, unspecified: Secondary | ICD-10-CM

## 2013-03-17 DIAGNOSIS — Z139 Encounter for screening, unspecified: Secondary | ICD-10-CM

## 2013-03-17 DIAGNOSIS — F329 Major depressive disorder, single episode, unspecified: Secondary | ICD-10-CM

## 2013-03-17 DIAGNOSIS — M25569 Pain in unspecified knee: Secondary | ICD-10-CM

## 2013-03-17 MED ORDER — TRIAMTERENE-HCTZ 37.5-25 MG PO TABS: 1.0000 | ORAL_TABLET | Freq: Every day | ORAL | Status: DC

## 2013-03-17 MED ORDER — FLUOXETINE HCL 40 MG PO CAPS: 40.0000 mg | ORAL_CAPSULE | Freq: Every day | ORAL | Status: DC

## 2013-03-17 NOTE — Patient Instructions (Addendum)
F/u end Februaury/early March, please call if you need me before  Happy that you feel better.  TdAP today  Increase in dose of prozac to 40mg  once daily, (OK to take two 20mg  tablet daily till done), new dose is sent in  Blood pressure is improved , please commit to low sodium diet and regular exercise for 30 minutes, and increase fruit and vegetable in diet  Fasting lipid, cmp end Feb , before next visit  You are referred to spine specialist in for January appointment  Please get mammogram and colonscopy as discussed

## 2013-03-17 NOTE — Progress Notes (Signed)
  Subjective:    Patient ID: Pamela Walters, female    DOB: [birth year], 52 y.o.   MRN: [id number]  HPI The PT is here for follow up and re-evaluation of chronic medical conditions, medication management and review of any available recent lab and radiology data.  Preventive health is updated, specifically  Cancer screening and Immunization.   Questions or concerns regarding consultations or procedures which the PT has had in the interim are  Addressed.Has seen neurologist, no significant focus on her MS but back pain, per pt report, she has also been started on  New meds which help. Neurologist to see her in the next 2 weeks, at that time records from previous neurologist should be avialable The PT denies any adverse reactions to current medications since the last visit. No adverse s/e from BP med, also doing better on prozac There are no new concerns.  There are no specific complaints       Review of Systems See HPI Denies recent fever or chills. Denies sinus pressure, nasal congestion, ear pain or sore throat. Denies chest congestion, productive cough or wheezing. Denies chest pains, palpitations and leg swelling Denies abdominal pain, nausea, vomiting,diarrhea or constipation.   Denies dysuria, frequency, hesitancy or incontinence.  Denies headaches, seizures, numbness, or tingling. Denies uncontrolled depression, anxiety or insomnia. Denies skin break down or rash.        Objective:   Physical Exam  Patient alert and oriented and in no cardiopulmonary distress.  HEENT: No facial asymmetry, EOMI, no sinus tenderness,  oropharynx pink and moist.  Neck supple no adenopathy.  Chest: Clear to auscultation bilaterally.  CVS: S1, S2 no murmurs, no S3.  ABD: Soft non tender. Bowel sounds normal.  Ext: No edema  MS: Adequate ROM spine, shoulders, hips and knees.  Skin: Intact, no ulcerations or rash noted.  Psych: Good eye contact, normal affect. Memory intact not  anxious or depressed appearing.  CNS: CN 2-12 intact, power, tone and sensation normal throughout.       Assessment & Plan:

## 2013-03-18 NOTE — Assessment & Plan Note (Signed)
Marked improvement. Continue meds per neurology, and she will see spine specialist in the New Year

## 2013-03-18 NOTE — Assessment & Plan Note (Signed)
Much improved, lifestyle modification  tpo [push systolic below 140 No med change DASH diet and commitment to daily physical activity for a minimum of 30 minutes discussed and encouraged, as a part of hypertension management. The importance of attaining a healthy weight is also discussed.

## 2013-03-18 NOTE — Assessment & Plan Note (Signed)
Pt still awaiting neuro re eval, has upcoming appt in 2 weeksencouraged to make Doc aware that she wants this to be the focus of her f/u and not back pain

## 2013-03-18 NOTE — Assessment & Plan Note (Signed)
Improved , received injection by ortho with good results

## 2013-03-18 NOTE — Assessment & Plan Note (Signed)
Improved, dose increase in fluoxetine Pt also encouraged to commit to regular exercise

## 2013-03-18 NOTE — Assessment & Plan Note (Signed)
Improved. Pt applauded on succesful weight loss through lifestyle change, and encouraged to continue same. Weight loss goal set for the next several months.  

## 2013-03-28 ENCOUNTER — Ambulatory Visit (HOSPITAL_COMMUNITY): Payer: BC Managed Care – PPO

## 2013-04-04 ENCOUNTER — Ambulatory Visit (HOSPITAL_COMMUNITY): Payer: BC Managed Care – PPO

## 2013-04-04 NOTE — Telephone Encounter (Signed)
Sorry, not in [employer]. Routing to Dr. Lodema Hong. Pt has not returned call or responded to letter to schedule colonoscopy.

## 2013-04-04 NOTE — Telephone Encounter (Signed)
Note sent to PCP through referral workqueue.

## 2013-04-11 ENCOUNTER — Ambulatory Visit: Payer: BC Managed Care – PPO | Admitting: Gastroenterology

## 2013-04-24 ENCOUNTER — Telehealth: Payer: Self-pay | Admitting: Family Medicine

## 2013-05-01 ENCOUNTER — Encounter: Payer: Self-pay | Admitting: Gastroenterology

## 2013-05-01 NOTE — Telephone Encounter (Signed)
Called patient and left message for them to return call at the office   

## 2013-05-03 NOTE — Telephone Encounter (Signed)
pls f/u as able

## 2013-05-04 ENCOUNTER — Other Ambulatory Visit: Payer: Self-pay

## 2013-05-04 DIAGNOSIS — E785 Hyperlipidemia, unspecified: Secondary | ICD-10-CM

## 2013-05-04 DIAGNOSIS — I10 Essential (primary) hypertension: Secondary | ICD-10-CM

## 2013-05-04 DIAGNOSIS — F3289 Other specified depressive episodes: Secondary | ICD-10-CM

## 2013-05-04 DIAGNOSIS — F329 Major depressive disorder, single episode, unspecified: Secondary | ICD-10-CM

## 2013-05-04 MED ORDER — FLUOXETINE HCL 40 MG PO CAPS: 40.0000 mg | ORAL_CAPSULE | Freq: Every day | ORAL | Status: DC

## 2013-05-04 MED ORDER — TRIAMTERENE-HCTZ 37.5-25 MG PO TABS: 1.0000 | ORAL_TABLET | Freq: Every day | ORAL | Status: DC

## 2013-05-04 MED ORDER — PRAVASTATIN SODIUM 80 MG PO TABS: 80.0000 mg | ORAL_TABLET | Freq: Every day | ORAL | Status: DC

## 2013-05-04 NOTE — Telephone Encounter (Signed)
Wanted all her meds sent to [facility]. [facility] but [facility] so that's where I sent the meds

## 2013-05-10 ENCOUNTER — Ambulatory Visit: Payer: BC Managed Care – PPO | Admitting: Gastroenterology

## 2013-05-22 ENCOUNTER — Ambulatory Visit: Payer: BC Managed Care – PPO | Attending: Orthopedic Surgery | Admitting: Physical Therapy

## 2013-05-22 DIAGNOSIS — M545 Low back pain, unspecified: Secondary | ICD-10-CM | POA: Insufficient documentation

## 2013-05-22 DIAGNOSIS — R5381 Other malaise: Secondary | ICD-10-CM | POA: Insufficient documentation

## 2013-05-22 DIAGNOSIS — G35 Multiple sclerosis: Secondary | ICD-10-CM | POA: Insufficient documentation

## 2013-05-22 DIAGNOSIS — IMO0001 Reserved for inherently not codable concepts without codable children: Secondary | ICD-10-CM | POA: Insufficient documentation

## 2013-05-22 DIAGNOSIS — I10 Essential (primary) hypertension: Secondary | ICD-10-CM | POA: Insufficient documentation

## 2013-05-23 ENCOUNTER — Ambulatory Visit: Payer: BC Managed Care – PPO | Admitting: Physical Therapy

## 2013-05-24 ENCOUNTER — Telehealth: Payer: Self-pay | Admitting: Gastroenterology

## 2013-05-24 ENCOUNTER — Ambulatory Visit: Payer: BC Managed Care – PPO | Admitting: Gastroenterology

## 2013-05-24 ENCOUNTER — Encounter: Payer: Self-pay | Admitting: Gastroenterology

## 2013-05-24 NOTE — Telephone Encounter (Signed)
Pt was a no show

## 2013-05-24 NOTE — Telephone Encounter (Signed)
MAILED LETTER °

## 2013-05-29 ENCOUNTER — Ambulatory Visit: Payer: BC Managed Care – PPO | Attending: Orthopedic Surgery | Admitting: Physical Therapy

## 2013-05-29 DIAGNOSIS — G35 Multiple sclerosis: Secondary | ICD-10-CM | POA: Insufficient documentation

## 2013-05-29 DIAGNOSIS — M545 Low back pain, unspecified: Secondary | ICD-10-CM | POA: Diagnosis not present

## 2013-05-29 DIAGNOSIS — R5381 Other malaise: Secondary | ICD-10-CM | POA: Insufficient documentation

## 2013-05-29 DIAGNOSIS — IMO0001 Reserved for inherently not codable concepts without codable children: Secondary | ICD-10-CM | POA: Diagnosis present

## 2013-06-01 ENCOUNTER — Ambulatory Visit: Payer: BC Managed Care – PPO | Admitting: Physical Therapy

## 2013-06-01 DIAGNOSIS — IMO0001 Reserved for inherently not codable concepts without codable children: Secondary | ICD-10-CM | POA: Diagnosis not present

## 2013-06-05 ENCOUNTER — Ambulatory Visit: Payer: BC Managed Care – PPO | Admitting: Physical Therapy

## 2013-06-05 DIAGNOSIS — IMO0001 Reserved for inherently not codable concepts without codable children: Secondary | ICD-10-CM | POA: Diagnosis not present

## 2013-06-08 ENCOUNTER — Ambulatory Visit: Payer: BC Managed Care – PPO | Admitting: Physical Therapy

## 2013-06-08 DIAGNOSIS — IMO0001 Reserved for inherently not codable concepts without codable children: Secondary | ICD-10-CM | POA: Diagnosis not present

## 2013-06-14 ENCOUNTER — Ambulatory Visit: Payer: BC Managed Care – PPO | Admitting: Physical Therapy

## 2013-06-14 DIAGNOSIS — IMO0001 Reserved for inherently not codable concepts without codable children: Secondary | ICD-10-CM | POA: Diagnosis not present

## 2013-06-15 ENCOUNTER — Encounter: Payer: BC Managed Care – PPO | Admitting: Physical Therapy

## 2013-06-20 ENCOUNTER — Encounter: Payer: BC Managed Care – PPO | Admitting: Physical Therapy

## 2013-06-22 ENCOUNTER — Encounter: Payer: BC Managed Care – PPO | Admitting: *Deleted

## 2013-06-30 ENCOUNTER — Ambulatory Visit: Payer: BC Managed Care – PPO | Admitting: Family Medicine

## 2013-07-07 ENCOUNTER — Ambulatory Visit: Payer: BC Managed Care – PPO | Attending: Orthopedic Surgery | Admitting: *Deleted

## 2013-07-07 DIAGNOSIS — M545 Low back pain, unspecified: Secondary | ICD-10-CM | POA: Insufficient documentation

## 2013-07-07 DIAGNOSIS — R5381 Other malaise: Secondary | ICD-10-CM | POA: Insufficient documentation

## 2013-07-07 DIAGNOSIS — IMO0001 Reserved for inherently not codable concepts without codable children: Secondary | ICD-10-CM | POA: Insufficient documentation

## 2013-07-07 DIAGNOSIS — G35 Multiple sclerosis: Secondary | ICD-10-CM | POA: Insufficient documentation

## 2013-07-10 ENCOUNTER — Ambulatory Visit: Payer: BC Managed Care – PPO | Admitting: Physical Therapy

## 2013-07-13 ENCOUNTER — Ambulatory Visit: Payer: BC Managed Care – PPO | Admitting: Physical Therapy

## 2013-07-17 ENCOUNTER — Ambulatory Visit: Payer: BC Managed Care – PPO | Admitting: Physical Therapy

## 2013-07-20 ENCOUNTER — Ambulatory Visit: Payer: BC Managed Care – PPO | Admitting: Physical Therapy

## 2013-08-03 ENCOUNTER — Ambulatory Visit: Payer: BC Managed Care – PPO | Admitting: Family Medicine

## 2013-09-14 ENCOUNTER — Ambulatory Visit: Payer: BC Managed Care – PPO | Admitting: Family Medicine

## 2013-09-18 ENCOUNTER — Encounter (HOSPITAL_COMMUNITY): Payer: Self-pay | Admitting: Emergency Medicine

## 2013-09-18 ENCOUNTER — Emergency Department (HOSPITAL_COMMUNITY): Payer: BC Managed Care – PPO

## 2013-09-18 ENCOUNTER — Encounter (HOSPITAL_COMMUNITY)
Admission: EM | Disposition: A | Discharge status: Home / Self Care | Payer: Self-pay | Source: Home / Self Care | Attending: Internal Medicine

## 2013-09-18 ENCOUNTER — Inpatient Hospital Stay (HOSPITAL_COMMUNITY)
Admission: EM | Admit: 2013-09-18 | Discharge: 2013-09-26 | DRG: 393 | Disposition: A | Discharge status: Home / Self Care | Payer: BC Managed Care – PPO | Attending: Internal Medicine | Admitting: Internal Medicine

## 2013-09-18 DIAGNOSIS — K222 Esophageal obstruction: Secondary | ICD-10-CM

## 2013-09-18 DIAGNOSIS — R51 Headache: Secondary | ICD-10-CM

## 2013-09-18 DIAGNOSIS — T18128A Food in esophagus causing other injury, initial encounter: Secondary | ICD-10-CM

## 2013-09-18 DIAGNOSIS — Z8249 Family history of ischemic heart disease and other diseases of the circulatory system: Secondary | ICD-10-CM

## 2013-09-18 DIAGNOSIS — F329 Major depressive disorder, single episode, unspecified: Secondary | ICD-10-CM | POA: Diagnosis present

## 2013-09-18 DIAGNOSIS — E663 Overweight: Secondary | ICD-10-CM | POA: Diagnosis present

## 2013-09-18 DIAGNOSIS — G8929 Other chronic pain: Secondary | ICD-10-CM | POA: Diagnosis present

## 2013-09-18 DIAGNOSIS — M549 Dorsalgia, unspecified: Secondary | ICD-10-CM | POA: Diagnosis present

## 2013-09-18 DIAGNOSIS — T18108A Unspecified foreign body in esophagus causing other injury, initial encounter: Principal | ICD-10-CM

## 2013-09-18 DIAGNOSIS — Z6831 Body mass index (BMI) 31.0-31.9, adult: Secondary | ICD-10-CM

## 2013-09-18 DIAGNOSIS — R079 Chest pain, unspecified: Secondary | ICD-10-CM

## 2013-09-18 DIAGNOSIS — E669 Obesity, unspecified: Secondary | ICD-10-CM | POA: Diagnosis present

## 2013-09-18 DIAGNOSIS — F418 Other specified anxiety disorders: Secondary | ICD-10-CM | POA: Diagnosis present

## 2013-09-18 DIAGNOSIS — I10 Essential (primary) hypertension: Secondary | ICD-10-CM | POA: Diagnosis present

## 2013-09-18 DIAGNOSIS — S27819A Unspecified injury of esophagus (thoracic part), initial encounter: Secondary | ICD-10-CM | POA: Diagnosis present

## 2013-09-18 DIAGNOSIS — K449 Diaphragmatic hernia without obstruction or gangrene: Secondary | ICD-10-CM | POA: Diagnosis present

## 2013-09-18 DIAGNOSIS — IMO0002 Reserved for concepts with insufficient information to code with codable children: Secondary | ICD-10-CM | POA: Diagnosis present

## 2013-09-18 DIAGNOSIS — D131 Benign neoplasm of stomach: Secondary | ICD-10-CM | POA: Diagnosis present

## 2013-09-18 DIAGNOSIS — R1319 Other dysphagia: Secondary | ICD-10-CM | POA: Diagnosis present

## 2013-09-18 DIAGNOSIS — R0789 Other chest pain: Secondary | ICD-10-CM | POA: Diagnosis present

## 2013-09-18 DIAGNOSIS — K223 Perforation of esophagus: Secondary | ICD-10-CM

## 2013-09-18 DIAGNOSIS — G35 Multiple sclerosis: Secondary | ICD-10-CM | POA: Diagnosis present

## 2013-09-18 DIAGNOSIS — E785 Hyperlipidemia, unspecified: Secondary | ICD-10-CM | POA: Diagnosis present

## 2013-09-18 DIAGNOSIS — E876 Hypokalemia: Secondary | ICD-10-CM

## 2013-09-18 DIAGNOSIS — T797XXA Traumatic subcutaneous emphysema, initial encounter: Secondary | ICD-10-CM | POA: Diagnosis present

## 2013-09-18 DIAGNOSIS — F3289 Other specified depressive episodes: Secondary | ICD-10-CM | POA: Diagnosis present

## 2013-09-18 HISTORY — DX: Dorsalgia, unspecified: M54.9

## 2013-09-18 HISTORY — PX: ESOPHAGOGASTRODUODENOSCOPY: SHX5428

## 2013-09-18 LAB — I-STAT CHEM 8, ED
BUN: 16 mg/dL (ref 6–23)
Calcium, Ion: 1.12 mmol/L (ref 1.12–1.23)
Chloride: 103 mEq/L (ref 96–112)
Creatinine, Ser: 1 mg/dL (ref 0.50–1.10)
Glucose, Bld: 95 mg/dL (ref 70–99)
HCT: 36 % (ref 36.0–46.0)
Hemoglobin: 12.2 g/dL (ref 12.0–15.0)
Potassium: 3.5 mEq/L — ABNORMAL LOW (ref 3.7–5.3)
SODIUM: 145 meq/L (ref 137–147)
TCO2: 25 mmol/L (ref 0–100)

## 2013-09-18 LAB — CBC WITH DIFFERENTIAL/PLATELET
Basophils Absolute: 0 10*3/uL (ref 0.0–0.1)
Basophils Relative: 0 % (ref 0–1)
EOS PCT: 0 % (ref 0–5)
Eosinophils Absolute: 0 10*3/uL (ref 0.0–0.7)
HCT: 36.5 % (ref 36.0–46.0)
HEMOGLOBIN: 12.2 g/dL (ref 12.0–15.0)
LYMPHS ABS: 2.1 10*3/uL (ref 0.7–4.0)
LYMPHS PCT: 19 % (ref 12–46)
MCH: 26.9 pg (ref 26.0–34.0)
MCHC: 33.4 g/dL (ref 30.0–36.0)
MCV: 80.4 fL (ref 78.0–100.0)
MONOS PCT: 5 % (ref 3–12)
Monocytes Absolute: 0.6 10*3/uL (ref 0.1–1.0)
Neutro Abs: 8.2 10*3/uL — ABNORMAL HIGH (ref 1.7–7.7)
Neutrophils Relative %: 76 % (ref 43–77)
PLATELETS: 320 10*3/uL (ref 150–400)
RBC: 4.54 MIL/uL (ref 3.87–5.11)
RDW: 16.1 % — ABNORMAL HIGH (ref 11.5–15.5)
WBC: 11 10*3/uL — AB (ref 4.0–10.5)

## 2013-09-18 SURGERY — EGD (ESOPHAGOGASTRODUODENOSCOPY)
Anesthesia: Moderate Sedation

## 2013-09-18 MED ORDER — MIDAZOLAM HCL 5 MG/ML IJ SOLN
INTRAMUSCULAR | Status: AC
  Filled 2013-09-18: qty 1

## 2013-09-18 MED ORDER — MIDAZOLAM HCL 10 MG/2ML IJ SOLN
INTRAMUSCULAR | Status: DC | PRN
  Administered 2013-09-18 (×3): 2 mg via INTRAVENOUS

## 2013-09-18 MED ORDER — PANTOPRAZOLE SODIUM 40 MG IV SOLR
40.0000 mg | INTRAVENOUS | Status: AC
  Administered 2013-09-18: 40 mg via INTRAVENOUS
  Filled 2013-09-18: qty 40

## 2013-09-18 MED ORDER — PIPERACILLIN-TAZOBACTAM 3.375 G IVPB 30 MIN
3.3750 g | Freq: Once | INTRAVENOUS | Status: AC
  Administered 2013-09-18: 3.375 g via INTRAVENOUS
  Filled 2013-09-18: qty 50

## 2013-09-18 MED ORDER — SODIUM CHLORIDE 0.9 % IV SOLN
INTRAVENOUS | Status: AC
  Administered 2013-09-18: 100 mL/h via INTRAVENOUS
  Administered 2013-09-18 – 2013-09-20 (×2): via INTRAVENOUS

## 2013-09-18 MED ORDER — ONDANSETRON HCL 4 MG/2ML IJ SOLN
4.0000 mg | INTRAMUSCULAR | Status: DC | PRN
  Administered 2013-09-19 – 2013-09-25 (×4): 4 mg via INTRAVENOUS
  Filled 2013-09-18 (×4): qty 2

## 2013-09-18 MED ORDER — GLUCAGON HCL (RDNA) 1 MG IJ SOLR
1.0000 mg | Freq: Once | INTRAMUSCULAR | Status: AC
  Administered 2013-09-18: 1 mg via INTRAVENOUS
  Filled 2013-09-18: qty 1

## 2013-09-18 MED ORDER — ONDANSETRON HCL 4 MG/2ML IJ SOLN
4.0000 mg | Freq: Once | INTRAMUSCULAR | Status: AC
  Administered 2013-09-18: 4 mg via INTRAVENOUS
  Filled 2013-09-18: qty 2

## 2013-09-18 MED ORDER — FENTANYL CITRATE 0.05 MG/ML IJ SOLN
INTRAMUSCULAR | Status: DC | PRN
  Administered 2013-09-18 (×2): 25 ug via INTRAVENOUS

## 2013-09-18 MED ORDER — BUTAMBEN-TETRACAINE-BENZOCAINE 2-2-14 % EX AERO
INHALATION_SPRAY | CUTANEOUS | Status: DC | PRN
  Administered 2013-09-18: 2 via TOPICAL

## 2013-09-18 MED ORDER — FENTANYL CITRATE 0.05 MG/ML IJ SOLN
INTRAMUSCULAR | Status: AC
  Filled 2013-09-18: qty 2

## 2013-09-18 NOTE — Progress Notes (Signed)
Called by Dr. Alvino Chapel with CXR resullts post EGD performed for esophageal food impaction.  Impaction removed with forceps and banding cap.  Mucosal tear visualized at level of food impaction.  Endoscopically did not appear like a full-thickness perforation:  FINDINGS:  Scattered soft tissue air is noted tracking about both sides of the  neck, worse on the right, and to a lesser extent about the superior  mediastinum. This raises suspicion for either a hypopharyngeal  mucosal tear or possibly a tear at the upper esophagus. The  remainder of the mediastinum is unremarkable in appearance.  Prominent prevertebral soft tissue air is noted on the lateral view.  The lungs are clear bilaterally. No focal consolidation, pleural  effusion or pneumothorax is seen.  No acute osseous abnormalities are identified.  IMPRESSION:  Scattered soft tissue air tracking about both sides of the neck,  worse on the right, and to a lesser extent about the superior  mediastinum. Prominent prevertebral soft tissue air noted on the  lateral view. This raises suspicion for either a hypopharyngeal  mucosal tear, more likely on the right, or possibly a tear at the  upper esophagus.  These results were called by telephone at the time of interpretation  on 09/18/2013 at 9:47 PM to Dr. Davonna Belling, who verbally  acknowledged these results.  I went back to check on the patient, who remains in the ED CT surgery consult recommended and called by ED MD Pt resting comfortably in bed with husband and friend at bedside She reports mild chest discomfort and "soreness" in the right anterior neck.  No dyspnea.   She feels anxious when learning of esophageal tear IV zofran given by RN  Plan CT surgery consultation with Dr. Roxy Manns Strict NPO, avoid all oral intake IVFs, and nutritional support IV abx, broad spectrum IV BID PPI Hopefully will be able to successfully manage medically given early diagnosis, minimal symptoms,  the fact the tear is not in neoplastic tissue. GI will follow

## 2013-09-18 NOTE — ED Provider Notes (Signed)
CSN: [id number]     Arrival date & time 09/18/13  1408 History   First MD Initiated Contact with Patient 09/18/13 1629     Chief Complaint  Patient presents with  . Food Intolerance     (Consider location/radiation/quality/duration/timing/severity/associated sxs/prior Treatment) The history is provided by the patient.   patient states she's eating chicken last night and felt his take on her chest. She states she's had this before but usually passes clots on. She states she's been unable to eat or drink since. She states she has had to spit up her saliva around every 15 minutes and she comes back up a few minutes later. At this point. She states it feels as on the middle part of her chest, slightly towards the right. No numbness weakness. His episodes like this before but never seen gastroenterology for it.  Past Medical History  Diagnosis Date  . Hypertension   . Hyperlipidemia   . MS (multiple sclerosis)   . Back pain    Past Surgical History  Procedure Laterality Date  . Abdominal hysterectomy  2005    fibroids   Family History  Problem Relation Age of Onset  . Hypertension Mother   . Hyperlipidemia Mother   . Hypertension Father    History  Substance Use Topics  . Smoking status: Never Smoker   . Smokeless tobacco: Not on file  . Alcohol Use: No   OB History   Grav Para Term Preterm Abortions TAB SAB Ect Mult Living                 Review of Systems  Constitutional: Negative for activity change, appetite change and unexpected weight change.  Eyes: Negative for pain.  Respiratory: Negative for chest tightness, shortness of breath and wheezing.   Cardiovascular: Negative for chest pain and leg swelling.  Gastrointestinal: Positive for vomiting. Negative for nausea, abdominal pain and diarrhea.  Neurological: Negative for weakness.      Allergies  Review of patient's allergies indicates no known allergies.  Home Medications   Prior to Admission medications    Medication Sig Start Date End Date Taking? Authorizing Provider  Chlorzoxazone (LORZONE) 375 MG TABS Take by mouth. One to two tabs daily    Historical Provider, MD  diclofenac (VOLTAREN) 75 MG EC tablet Take 75 mg by mouth 2 (two) times daily.    Historical Provider, MD  FLUoxetine (PROZAC) 40 MG capsule Take 1 capsule (40 mg total) by mouth daily. 05/04/13 05/04/14  Fayrene Helper, MD  pravastatin (PRAVACHOL) 80 MG tablet Take 1 tablet (80 mg total) by mouth daily. 05/04/13   Fayrene Helper, MD  tiZANidine (ZANAFLEX) 4 MG capsule Take 4 mg by mouth at bedtime.    Historical Provider, MD  triamterene-hydrochlorothiazide (MAXZIDE-25) 37.5-25 MG per tablet Take 1 tablet by mouth daily. 05/04/13 05/04/14  Fayrene Helper, MD   BP 128/62  Pulse 84  Temp(Src) 97.9 F (36.6 C) (Oral)  Resp 16  SpO2 93% Physical Exam  Nursing note and vitals reviewed. Constitutional: She is oriented to person, place, and time. She appears well-developed and well-nourished.  HENT:  Head: Normocephalic and atraumatic.  Eyes: EOM are normal. Pupils are equal, round, and reactive to light.  Neck: Normal range of motion. Neck supple.  Cardiovascular: Normal rate, regular rhythm and normal heart sounds.   No murmur heard. Pulmonary/Chest: Effort normal and breath sounds normal. No respiratory distress. She has no wheezes. She has no rales.  Abdominal: Soft. Bowel  sounds are normal. She exhibits no distension. There is no tenderness. There is no rebound and no guarding.  Musculoskeletal: Normal range of motion.  Neurological: She is alert and oriented to person, place, and time. No cranial nerve deficit.  Skin: Skin is warm and dry.  Psychiatric: She has a normal mood and affect. Her speech is normal.    ED Course  Procedures (including critical care time) Labs Review Labs Reviewed  CBC WITH DIFFERENTIAL - Abnormal; Notable for the following:    WBC 11.0 (*)    RDW 16.1 (*)    Neutro Abs 8.2 (*)    All  other components within normal limits  I-STAT CHEM 8, ED - Abnormal; Notable for the following:    Potassium 3.5 (*)    All other components within normal limits    Imaging Review Dg Chest 2 View  09/18/2013   CLINICAL DATA:  Status post procedure; mild mucosal tearing of the esophagus. Assess for pneumomediastinum.  EXAM: CHEST  2 VIEW  COMPARISON:  Chest radiograph performed earlier today at 2:56 p.m.  FINDINGS: Scattered soft tissue air is noted tracking about both sides of the neck, worse on the right, and to a lesser extent about the superior mediastinum. This raises suspicion for either a hypopharyngeal mucosal tear or possibly a tear at the upper esophagus. The remainder of the mediastinum is unremarkable in appearance. Prominent prevertebral soft tissue air is noted on the lateral view.  The lungs are clear bilaterally. No focal consolidation, pleural effusion or pneumothorax is seen.  No acute osseous abnormalities are identified.  IMPRESSION: Scattered soft tissue air tracking about both sides of the neck, worse on the right, and to a lesser extent about the superior mediastinum. Prominent prevertebral soft tissue air noted on the lateral view. This raises suspicion for either a hypopharyngeal mucosal tear, more likely on the right, or possibly a tear at the upper esophagus.  These results were called by telephone at the time of interpretation on 09/18/2013 at 9:47 PM to Dr. Davonna Belling, who verbally acknowledged these results.   Electronically Signed   By: Garald Balding M.D.   On: 09/18/2013 21:48   Dg Chest 2 View  09/18/2013   CLINICAL DATA:  Right-sided chest pain.  EXAM: CHEST  2 VIEW  COMPARISON:  None.  FINDINGS: Cardiac silhouette is normal in size. The aorta is mildly uncoiled. No mediastinal or hilar masses. No evidence of adenopathy.  Lungs are clear.  No pleural effusion.  No pneumothorax.  Bony thorax is demineralized but intact.  No evidence of an opaque esophageal foreign  body.  IMPRESSION: No active cardiopulmonary disease.   Electronically Signed   By: Lajean Manes M.D.   On: 09/18/2013 15:10     EKG Interpretation None      MDM   Final diagnoses:  Esophageal obstruction due to food impaction  Esophageal perforation    Patient presented with esophageal meat impaction. No relief with glucagon. Taken to endoscopy by Dr. Hilarie Fredrickson. Did have small mucosal tear over inferior esophagus. X-ray was done and showed subcutaneous and mediastinal emphysema. Antibiotics started. Patient was made n.p.o. GI was consulted. Cardiothoracic surgery was consulted, however emergent operation and did not immediately help of the case. Cardiothoracic will be reconsulted    Ovid Curd R. Alvino Chapel, MD 09/19/13 Shelah Lewandowsky

## 2013-09-18 NOTE — Consult Note (Signed)
Consult Note  Primary Care Physician:  [name], MD Primary Gastroenterologist:   None Consulting MD: Alvino Chapel, Emergency Room MD  HPI: Pamela Walters is a 53 y.o. female the past medical history of hypertension, hyperlipidemia, MS, who presents to the emergency department this afternoon with esophageal food impaction. She developed acute dysphagia last night after eating chicken. She was able to rest a candidate will take care of itself but woke up this morning unable to eat or drink. She's also had difficulty swallowing her secretions all day long. She tried to force down the food and also vomit the food up but was unsuccessful. She does have a pressure in her chest but denies dyspnea. No fevers or chills. No abdominal pain. She denies a history of heartburn. She also denies a history of esophageal dysphagia or odynophagia. This is the first time she's ever had issue with swallowing or acute dysphagia.  Denies GI family history.  She has neve had an endoscopic procedure.  IV glucagon was given in the ER without benefit   Past Medical History  Diagnosis Date  . Hypertension   . Hyperlipidemia   . MS (multiple sclerosis)   . Back pain     Past Surgical History  Procedure Laterality Date  . Abdominal hysterectomy  2005    fibroids    Prior to Admission medications   Medication Sig Start Date End Date Taking? Authorizing Provider  FLUoxetine (PROZAC) 40 MG capsule Take 1 capsule (40 mg total) by mouth daily. 05/04/13 05/04/14 Yes Fayrene Helper, MD  HYDROcodone-acetaminophen (NORCO/VICODIN) 5-325 MG per tablet Take 1 tablet by mouth daily as needed (pain).  08/11/13  Yes Historical Provider, MD  ibuprofen (ADVIL,MOTRIN) 200 MG tablet Take 800 mg by mouth 2 (two) times daily as needed (pain).   Yes Historical Provider, MD  Multiple Vitamin (MULTIVITAMIN WITH MINERALS) TABS tablet Take 1 tablet by mouth daily.   Yes Historical Provider, MD  pravastatin (PRAVACHOL) 80 MG  tablet Take 80 mg by mouth at bedtime.   Yes Historical Provider, MD  tiZANidine (ZANAFLEX) 4 MG capsule Take 4 mg by mouth 2 (two) times daily as needed (back spasms).    Yes Historical Provider, MD  triamterene-hydrochlorothiazide (MAXZIDE-25) 37.5-25 MG per tablet Take 1 tablet by mouth daily. 05/04/13 05/04/14 Yes Fayrene Helper, MD    Current Facility-Administered Medications  Medication Dose Route Frequency Provider Last Rate Last Dose  . butamben-tetracaine-benzocaine (CETACAINE) spray    PRN Jerene Bears, MD   2 spray at 09/18/13 1918    Allergies as of 09/18/2013  . (No Known Allergies)    Family History  Problem Relation Age of Onset  . Hypertension Mother   . Hyperlipidemia Mother   . Hypertension Father     History   Social History  . Marital Status: Married    Spouse Name: N/A    Number of Children: N/A  . Years of Education: N/A   Occupational History  . Not on file.   Social History Main Topics  . Smoking status: Never Smoker   . Smokeless tobacco: Not on file  . Alcohol Use: No  . Drug Use: No  . Sexual Activity: Yes    Birth Control/ Protection: Surgical   Other Topics Concern  . Not on file   Social History Narrative  . No narrative on file    Review of Systems:  All systems reviewed an negative except where noted in HPI.  Physical Exam: Vital signs  in last 24 hours: Temp:  [98.7 F (37.1 C)] 98.7 F (37.1 C) (05/25 1419) Pulse Rate:  [81-88] 81 (05/25 1920) Resp:  [18] 18 (05/25 1920) BP: (116-171)/(73-105) 170/86 mmHg (05/25 1920) SpO2:  [96 %-100 %] 100 % (05/25 1920)   Gen: awake, alert, NAD HEENT: anicteric, op clear CV: RRR, no mrg Pulm: CTA b/l Abd: soft, NT/ND, +BS throughout Ext: no c/c/e Neuro: nonfocal   Studies/Results: Dg Chest 2 View  09/18/2013   CLINICAL DATA:  Right-sided chest pain.  EXAM: CHEST  2 VIEW  COMPARISON:  None.  FINDINGS: Cardiac silhouette is normal in size. The aorta is mildly uncoiled. No  mediastinal or hilar masses. No evidence of adenopathy.  Lungs are clear.  No pleural effusion.  No pneumothorax.  Bony thorax is demineralized but intact.  No evidence of an opaque esophageal foreign body.  IMPRESSION: No active cardiopulmonary disease.   Electronically Signed   By: Lajean Manes M.D.   On: 09/18/2013 15:10    Impression / Plan:   53 y.o. female the past medical history of hypertension, hyperlipidemia, MS, who presents to the emergency department this afternoon with esophageal food impaction.   1.  Acute esophageal dysphagia/esophageal food impaction -- patient has signs and symptoms consistent with acute esophageal food impaction. I recommended upper endoscopy to relieve the impaction. We discussed the test at length including the risks and benefits and she is agreeable to proceed. It did make her aware that we would not be allowed to dilate given the fact that she has had a food impaction for almost 24 hours, but that she very likely would need repeat EGD with dilation in the near future. Further recommendations after EGD.       LOS: 0 days   Jerene Bears  09/18/2013, 7:24 PM

## 2013-09-18 NOTE — Op Note (Signed)
[facility] [place], [address]   ENDOSCOPY PROCEDURE REPORT  PATIENT: [name], [name]  MR#: [id number] BIRTHDATE: Sep 29, 1960 , 26  yrs. old GENDER: Female ENDOSCOPIST: Jerene Bears, MD REFERRED BY:  Dr. Alvino Chapel, MD [facility] ED) PROCEDURE DATE:  09/18/2013 PROCEDURE:  EGD w/ fb removal ASA CLASS:     Class II INDICATIONS:  Dysphagia.   Foreign body removal from esophagus. MEDICATIONS: These medications were titrated to patient response per physician's verbal order, Fentanyl 100 mcg IV, and Versed 10 mg IV  TOPICAL ANESTHETIC: Cetacaine Spray  DESCRIPTION OF PROCEDURE: After the risks benefits and alternatives of the procedure were thoroughly explained, informed consent was obtained.  The Pentax Gastroscope [id number] endoscope was introduced through the mouth and advanced to the second portion of the duodenum. Without limitations.  The instrument was slowly withdrawn as the mucosa was fully examined.     ESOPHAGUS: An esophageal meat bolus was encountered in the lower esophagus around 30 cm from the incisors.  This food bolus was obscuring the lumen completely and therefore it was removed using Raptor forceps and then a banding cap.  The large majority of the food bolus was removed proximally through the mouth.  After the majority of the food impaction was removed, a very small amount was able to be advanced into the stomach.  Once a significant portion of the food had been removed, there was an esophageal mucosal tear noted at the level of the food impaction. It was not bleeding. Just distal to the mucosal tear was a Schatzki's ring (at 34 cm). A 4 cm hiatal hernia was noted.  STOMACH: Small, fundic gland-appearing gastric polyps were seen in the gastric cardia and fundus.  The mucosa of the stomach appeared otherwise normal.  DUODENUM: The duodenal mucosa showed no abnormalities in the bulb and second portion of the  duodenum.  Retroflexed views revealed a hiatal hernia.     The scope was then withdrawn from the patient and the procedure completed.  COMPLICATIONS: There were no complications.  ENDOSCOPIC IMPRESSION: 1.   Esophageal food impaction, removed 2.   Esophageal mucosal tear at level of food impaction 3.   4 cm hiatal hernia 4.   Schatzki ring was found 34 cm from the incisors 5.   Small, benign-appearing, gastric polyps.   Otherwise normal appearing stomach mucosa 6.   The duodenal mucosa showed no abnormalities in the bulb and second portion of the duodenum  RECOMMENDATIONS: 1.  Liquid diet only today and tomorrow, advanced to soft solids thereafter 2.  omeprazole 40 mg daily for at least 2 months 3.  Office visit with me or advanced practitioner in 2-3 weeks 4.   Repeat CXR before discharge from ED  eSigned:  Jerene Bears, MD 09/18/2013 8:24 PM   CC:The Patient  PATIENT NAME:  Pamela Walters, Pamela Walters MR#: [id number]

## 2013-09-18 NOTE — ED Notes (Signed)
She states she ate chicken last night and has felt like its stuck in her throat since. States "i cant get it to dislodge, every time i eat or drink it comes back." she is breathing easily

## 2013-09-19 ENCOUNTER — Encounter (HOSPITAL_COMMUNITY): Payer: Self-pay | Admitting: Internal Medicine

## 2013-09-19 ENCOUNTER — Emergency Department (HOSPITAL_COMMUNITY): Payer: BC Managed Care – PPO

## 2013-09-19 DIAGNOSIS — J982 Interstitial emphysema: Secondary | ICD-10-CM

## 2013-09-19 DIAGNOSIS — F3289 Other specified depressive episodes: Secondary | ICD-10-CM

## 2013-09-19 DIAGNOSIS — G35 Multiple sclerosis: Secondary | ICD-10-CM

## 2013-09-19 DIAGNOSIS — E785 Hyperlipidemia, unspecified: Secondary | ICD-10-CM

## 2013-09-19 DIAGNOSIS — R079 Chest pain, unspecified: Secondary | ICD-10-CM

## 2013-09-19 DIAGNOSIS — K223 Perforation of esophagus: Secondary | ICD-10-CM

## 2013-09-19 DIAGNOSIS — R0789 Other chest pain: Secondary | ICD-10-CM | POA: Diagnosis present

## 2013-09-19 DIAGNOSIS — G35D Multiple sclerosis, unspecified: Secondary | ICD-10-CM

## 2013-09-19 DIAGNOSIS — F329 Major depressive disorder, single episode, unspecified: Secondary | ICD-10-CM

## 2013-09-19 DIAGNOSIS — I10 Essential (primary) hypertension: Secondary | ICD-10-CM

## 2013-09-19 LAB — COMPREHENSIVE METABOLIC PANEL
ALBUMIN: 3.4 g/dL — AB (ref 3.5–5.2)
ALT: 12 U/L (ref 0–35)
AST: 17 U/L (ref 0–37)
Alkaline Phosphatase: 54 U/L (ref 39–117)
BUN: 14 mg/dL (ref 6–23)
CALCIUM: 8.8 mg/dL (ref 8.4–10.5)
CHLORIDE: 107 meq/L (ref 96–112)
CO2: 24 mEq/L (ref 19–32)
CREATININE: 0.94 mg/dL (ref 0.50–1.10)
GFR calc Af Amer: 78 mL/min — ABNORMAL LOW (ref >90)
GFR, EST NON AFRICAN AMERICAN: 67 mL/min — AB (ref >90)
Glucose, Bld: 82 mg/dL (ref 70–99)
Potassium: 3.8 mEq/L (ref 3.7–5.3)
Sodium: 143 mEq/L (ref 137–147)
Total Bilirubin: 0.7 mg/dL (ref 0.3–1.2)
Total Protein: 6.6 g/dL (ref 6.0–8.3)

## 2013-09-19 LAB — TROPONIN I
Troponin I: <0.3 ng/mL (ref <0.30)
Troponin I: <0.3 ng/mL (ref <0.30)

## 2013-09-19 LAB — PROTIME-INR
INR: 1.1 (ref 0.00–1.49)
PROTHROMBIN TIME: 14 s (ref 11.6–15.2)

## 2013-09-19 LAB — MAGNESIUM: Magnesium: 2 mg/dL (ref 1.5–2.5)

## 2013-09-19 LAB — LACTIC ACID, PLASMA: Lactic Acid, Venous: 1 mmol/L (ref 0.5–2.2)

## 2013-09-19 LAB — APTT: aPTT: 29 seconds (ref 24–37)

## 2013-09-19 LAB — MRSA PCR SCREENING: MRSA by PCR: POSITIVE — AB

## 2013-09-19 MED ORDER — CHLORHEXIDINE GLUCONATE CLOTH 2 % EX PADS
6.0000 | MEDICATED_PAD | Freq: Every day | CUTANEOUS | Status: AC
  Administered 2013-09-20 – 2013-09-24 (×4): 6 via TOPICAL

## 2013-09-19 MED ORDER — IOHEXOL 300 MG/ML  SOLN
80.0000 mL | Freq: Once | INTRAMUSCULAR | Status: AC | PRN
  Administered 2013-09-19: 80 mL via INTRAVENOUS

## 2013-09-19 MED ORDER — SODIUM CHLORIDE 0.9 % IJ SOLN: 3.0000 mL | INTRAMUSCULAR | Status: DC | PRN

## 2013-09-19 MED ORDER — FENTANYL CITRATE 0.05 MG/ML IJ SOLN
50.0000 ug | Freq: Once | INTRAMUSCULAR | Status: AC
  Administered 2013-09-19: 50 ug via INTRAVENOUS
  Filled 2013-09-19: qty 2

## 2013-09-19 MED ORDER — VANCOMYCIN HCL IN DEXTROSE 1-5 GM/200ML-% IV SOLN
1000.0000 mg | Freq: Two times a day (BID) | INTRAVENOUS | Status: DC
  Administered 2013-09-19 – 2013-09-21 (×6): 1000 mg via INTRAVENOUS
  Filled 2013-09-19 (×9): qty 200

## 2013-09-19 MED ORDER — PIPERACILLIN-TAZOBACTAM 3.375 G IVPB
3.3750 g | Freq: Three times a day (TID) | INTRAVENOUS | Status: DC
  Administered 2013-09-19 – 2013-09-25 (×18): 3.375 g via INTRAVENOUS
  Filled 2013-09-19 (×20): qty 50

## 2013-09-19 MED ORDER — SODIUM CHLORIDE 0.9 % IJ SOLN
10.0000 mL | INTRAMUSCULAR | Status: DC | PRN
  Administered 2013-09-20 – 2013-09-26 (×7): 10 mL

## 2013-09-19 MED ORDER — SODIUM CHLORIDE 0.9 % IJ SOLN
3.0000 mL | Freq: Two times a day (BID) | INTRAMUSCULAR | Status: DC
  Administered 2013-09-19 – 2013-09-20 (×2): 3 mL via INTRAVENOUS

## 2013-09-19 MED ORDER — SODIUM CHLORIDE 0.9 % IJ SOLN
3.0000 mL | Freq: Two times a day (BID) | INTRAMUSCULAR | Status: DC
  Administered 2013-09-20 – 2013-09-24 (×3): 3 mL via INTRAVENOUS

## 2013-09-19 MED ORDER — MUPIROCIN 2 % EX OINT
1.0000 "application " | TOPICAL_OINTMENT | Freq: Two times a day (BID) | CUTANEOUS | Status: AC
  Administered 2013-09-19 – 2013-09-23 (×10): 1 via NASAL
  Filled 2013-09-19: qty 22

## 2013-09-19 MED ORDER — PANTOPRAZOLE SODIUM 40 MG IV SOLR
40.0000 mg | Freq: Two times a day (BID) | INTRAVENOUS | Status: DC
  Administered 2013-09-19 – 2013-09-24 (×12): 40 mg via INTRAVENOUS
  Filled 2013-09-19 (×14): qty 40

## 2013-09-19 MED ORDER — IOHEXOL 300 MG/ML  SOLN
150.0000 mL | Freq: Once | INTRAMUSCULAR | Status: AC | PRN
  Administered 2013-09-19: 75 mL via ORAL

## 2013-09-19 MED ORDER — HEPARIN SODIUM (PORCINE) 5000 UNIT/ML IJ SOLN
5000.0000 [IU] | Freq: Three times a day (TID) | INTRAMUSCULAR | Status: DC
  Administered 2013-09-19 – 2013-09-26 (×21): 5000 [IU] via SUBCUTANEOUS
  Filled 2013-09-19 (×24): qty 1

## 2013-09-19 MED ORDER — SODIUM CHLORIDE 0.9 % IV SOLN: 250.0000 mL | INTRAVENOUS | Status: DC | PRN

## 2013-09-19 MED ORDER — LORAZEPAM 2 MG/ML IJ SOLN
1.0000 mg | Freq: Every evening | INTRAMUSCULAR | Status: DC | PRN
  Administered 2013-09-19 – 2013-09-25 (×7): 1 mg via INTRAVENOUS
  Filled 2013-09-19 (×7): qty 1

## 2013-09-19 NOTE — Consult Note (Signed)
Reason for Consult:Esophageal perforation Referring Physician:ED  Pamela Walters is an 53 y.o. female.  HPI: 53 yo woman presented yesterday with a cc/o food being stuck in her throat  Pamela Walters is a 53 yo woman with a history of hypertension, hyperlipidemia and multiple sclerosis. She was eating chicken 2 days ago when she felt it become lodged. She was unable to swallow it completely down and also unable to throw it up. She waited for the sensation to go away, but it never resolved. She was unable to eat or drink anything yesterday. She came to the ED yesterday afternoon. She had endoscopy yesterday evening and was found to have a food impaction. After the impaction was cleared she was noted to have a Schatzki's ring and also a mucosal perforation. A CT was done last night and showed air in the mediastinum and neck. The CT was not officially read until this morning.   I was called at 0730 today.  She is currently having mild chest pain 3/10, it was 6/10 earlier. She also complains of mild neck pain and a sore throat. She denies fever, chills and diaphoresis. She has been afebrile since admission. She denies any prior history of difficulty swallowing. She denies abdominal pain and nausea.  Past Medical History  Diagnosis Date  . Hypertension   . Hyperlipidemia   . MS (multiple sclerosis)   . Back pain     Past Surgical History  Procedure Laterality Date  . Abdominal hysterectomy  2005    fibroids  . Esophagogastroduodenoscopy N/A 09/18/2013    Procedure: ESOPHAGOGASTRODUODENOSCOPY (EGD);  Surgeon: Pamela Bears, MD;  Location: [facility];  Service: Gastroenterology;  Laterality: N/A;    Family History  Problem Relation Age of Onset  . Hypertension Mother   . Hyperlipidemia Mother   . Hypertension Father     Social History:  reports that she has never smoked. She does not have any smokeless tobacco history on file. She reports that she does not drink alcohol or use illicit  drugs.  Allergies: No Known Allergies  Prior to Admission medications  Medication  Sig  Start Date  End Date  Taking?  Authorizing Provider  FLUoxetine (PROZAC) 40 MG capsule  Take 1 capsule (40 mg total) by mouth daily.  05/04/13  05/04/14  Yes  Fayrene Helper, MD  HYDROcodone-acetaminophen (NORCO/VICODIN) 5-325 MG per tablet  Take 1 tablet by mouth daily as needed (pain).  08/11/13  Yes  Historical Provider, MD  ibuprofen (ADVIL,MOTRIN) 200 MG tablet  Take 800 mg by mouth 2 (two) times daily as needed (pain).  Yes  Historical Provider, MD  Multiple Vitamin (MULTIVITAMIN WITH MINERALS) TABS tablet  Take 1 tablet by mouth daily.  Yes  Historical Provider, MD  pravastatin (PRAVACHOL) 80 MG tablet  Take 80 mg by mouth at bedtime.  Yes  Historical Provider, MD  tiZANidine (ZANAFLEX) 4 MG capsule  Take 4 mg by mouth 2 (two) times daily as needed (back spasms).  Yes  Historical Provider, MD  triamterene-hydrochlorothiazide (MAXZIDE-25) 37.5-25 MG per tablet  Take 1 tablet by mouth daily.  05/04/13  05/04/14  Yes  Fayrene Helper, MD    Results for orders placed during the hospital encounter of 09/18/13 (from the past 48 hour(s))  CBC WITH DIFFERENTIAL     Status: Abnormal   Collection Time    09/18/13 10:40 PM      Result Value Ref Range   WBC 11.0 (*) 4.0 - 10.5 K/uL  RBC 4.54  3.87 - 5.11 MIL/uL   Hemoglobin 12.2  12.0 - 15.0 g/dL   HCT 36.5  36.0 - 46.0 %   MCV 80.4  78.0 - 100.0 fL   MCH 26.9  26.0 - 34.0 pg   MCHC 33.4  30.0 - 36.0 g/dL   RDW 16.1 (*) 11.5 - 15.5 %   Platelets 320  150 - 400 K/uL   Neutrophils Relative % 76  43 - 77 %   Neutro Abs 8.2 (*) 1.7 - 7.7 K/uL   Lymphocytes Relative 19  12 - 46 %   Lymphs Abs 2.1  0.7 - 4.0 K/uL   Monocytes Relative 5  3 - 12 %   Monocytes Absolute 0.6  0.1 - 1.0 K/uL   Eosinophils Relative 0  0 - 5 %   Eosinophils Absolute 0.0  0.0 - 0.7 K/uL   Basophils Relative 0  0 - 1 %   Basophils Absolute 0.0  0.0 -  0.1 K/uL  I-STAT CHEM 8, ED     Status: Abnormal   Collection Time    09/18/13 10:44 PM      Result Value Ref Range   Sodium 145  137 - 147 mEq/L   Potassium 3.5 (*) 3.7 - 5.3 mEq/L   Chloride 103  96 - 112 mEq/L   BUN 16  6 - 23 mg/dL   Creatinine, Ser 1.00  0.50 - 1.10 mg/dL   Glucose, Bld 95  70 - 99 mg/dL   Calcium, Ion 1.12  1.12 - 1.23 mmol/L   TCO2 25  0 - 100 mmol/L   Hemoglobin 12.2  12.0 - 15.0 g/dL   HCT 36.0  36.0 - 46.0 %    Dg Chest 2 View  09/18/2013   CLINICAL DATA:  Status post procedure; mild mucosal tearing of the esophagus. Assess for pneumomediastinum.  EXAM: CHEST  2 VIEW  COMPARISON:  Chest radiograph performed earlier today at 2:56 p.m.  FINDINGS: Scattered soft tissue air is noted tracking about both sides of the neck, worse on the right, and to a lesser extent about the superior mediastinum. This raises suspicion for either a hypopharyngeal mucosal tear or possibly a tear at the upper esophagus. The remainder of the mediastinum is unremarkable in appearance. Prominent prevertebral soft tissue air is noted on the lateral view.  The lungs are clear bilaterally. No focal consolidation, pleural effusion or pneumothorax is seen.  No acute osseous abnormalities are identified.  IMPRESSION: Scattered soft tissue air tracking about both sides of the neck, worse on the right, and to a lesser extent about the superior mediastinum. Prominent prevertebral soft tissue air noted on the lateral view. This raises suspicion for either a hypopharyngeal mucosal tear, more likely on the right, or possibly a tear at the upper esophagus.  These results were called by telephone at the time of interpretation on 09/18/2013 at 9:47 PM to Dr. Davonna Belling, who verbally acknowledged these results.   Electronically Signed   By: Garald Balding M.D.   On: 09/18/2013 21:48   Dg Chest 2 View  09/18/2013   CLINICAL DATA:  Right-sided chest pain.  EXAM: CHEST  2 VIEW  COMPARISON:  None.  FINDINGS:  Cardiac silhouette is normal in size. The aorta is mildly uncoiled. No mediastinal or hilar masses. No evidence of adenopathy.  Lungs are clear.  No pleural effusion.  No pneumothorax.  Bony thorax is demineralized but intact.  No evidence of an opaque esophageal foreign  body.  IMPRESSION: No active cardiopulmonary disease.   Electronically Signed   By: Lajean Manes M.D.   On: 09/18/2013 15:10   Ct Soft Tissue Neck W Contrast  09/19/2013   CLINICAL DATA:  Recent food impaction with perforation  EXAM: CT NECK WITH CONTRAST, CT the chest with contrast  TECHNIQUE: Multidetector CT imaging of the neck and chest was performed using the standard protocol following the bolus administration of intravenous contrast. Additionally a small amount of oral contrast was administered.  CONTRAST:  31mL OMNIPAQUE IOHEXOL 300 MG/ML  SOLN  COMPARISON:  None.  FINDINGS: CT of the neck:  Skull base has contents are within normal limits. The parotid and submandibular glands are unremarkable. There is considerable amount of retropharyngeal a year identified extending throughout the neck from the recent esophageal perforation. A definitive sided perforation is not well appreciated on this exam. No significant lymphadenopathy is noted. The osseous structures are within normal limits with only mild facet hypertrophic changes.  CT of the chest:  The lungs are well aerated bilaterally without evidence of focal infiltrate or sizable effusion. A minimal amount of dependent atelectasis is noted bilaterally. The thoracic aorta and pulmonary artery as visualized are within normal limits. The origins of great vessels are unremarkable.  There is a considerable amount of mediastinal air identified related to the recent esophageal perforation. It extends superiorly into the neck and inferiorly into the upper abdomen surrounding the gastroesophageal junction. No large full thickness tear of the esophagus is identified. A small amount contrast was  administered and is noted in the distal esophagus. Middle most soft tissue density is noted within the contrast column which may represent a focal mucosal tear. No extravasation of contrast material into the mediastinum is identified. The visualized upper abdomen is within normal limits. The bony structures demonstrate degenerative change of thoracic spine.  IMPRESSION: CT of the neck: Considerable retropharyngeal air consistent with the recent esophageal perforation. No focal area to suggest a site of perforation is identified.  CT of the chest: Considerable mediastinal air consistent with recent esophageal perforation. There is an area of mucosal irregularity noted just above the gastroesophageal junction which may represent a partial thickness mucosal tear. No frank extravasation of contrast material into the mediastinum is noted.   Electronically Signed   By: Inez Catalina M.D.   On: 09/19/2013 07:15   Ct Chest W Contrast  09/19/2013   CLINICAL DATA:  Recent food impaction with perforation  EXAM: CT NECK WITH CONTRAST, CT the chest with contrast  TECHNIQUE: Multidetector CT imaging of the neck and chest was performed using the standard protocol following the bolus administration of intravenous contrast. Additionally a small amount of oral contrast was administered.  CONTRAST:  36mL OMNIPAQUE IOHEXOL 300 MG/ML  SOLN  COMPARISON:  None.  FINDINGS: CT of the neck:  Skull base has contents are within normal limits. The parotid and submandibular glands are unremarkable. There is considerable amount of retropharyngeal a year identified extending throughout the neck from the recent esophageal perforation. A definitive sided perforation is not well appreciated on this exam. No significant lymphadenopathy is noted. The osseous structures are within normal limits with only mild facet hypertrophic changes.  CT of the chest:  The lungs are well aerated bilaterally without evidence of focal infiltrate or sizable effusion.  A minimal amount of dependent atelectasis is noted bilaterally. The thoracic aorta and pulmonary artery as visualized are within normal limits. The origins of great vessels are unremarkable.  There  is a considerable amount of mediastinal air identified related to the recent esophageal perforation. It extends superiorly into the neck and inferiorly into the upper abdomen surrounding the gastroesophageal junction. No large full thickness tear of the esophagus is identified. A small amount contrast was administered and is noted in the distal esophagus. Middle most soft tissue density is noted within the contrast column which may represent a focal mucosal tear. No extravasation of contrast material into the mediastinum is identified. The visualized upper abdomen is within normal limits. The bony structures demonstrate degenerative change of thoracic spine.  IMPRESSION: CT of the neck: Considerable retropharyngeal air consistent with the recent esophageal perforation. No focal area to suggest a site of perforation is identified.  CT of the chest: Considerable mediastinal air consistent with recent esophageal perforation. There is an area of mucosal irregularity noted just above the gastroesophageal junction which may represent a partial thickness mucosal tear. No frank extravasation of contrast material into the mediastinum is noted.   Electronically Signed   By: Inez Catalina M.D.   On: 09/19/2013 07:15    Review of Systems  Constitutional: Negative for fever and chills.  HENT: Positive for sore throat.   Cardiovascular: Positive for chest pain.  Gastrointestinal: Negative for abdominal pain.       Dysphagia  Musculoskeletal: Positive for back pain and neck pain.  Neurological:       MS  All other systems reviewed and are negative.  Blood pressure 144/79, pulse 86, temperature 97.9 F (36.6 C), temperature source Oral, resp. rate 24, SpO2 96.00%. Physical Exam  Vitals reviewed. Constitutional: She is  oriented to person, place, and time. She appears well-developed and well-nourished. No distress.  HENT:  Head: Normocephalic and atraumatic.  Eyes: EOM are normal. Pupils are equal, round, and reactive to light.  Neck:  Minimal subcutaneous emphysema, mildly tender to palpation  Cardiovascular: Normal rate, regular rhythm and normal heart sounds.   No murmur heard. Respiratory: Effort normal and breath sounds normal.  GI: Soft. There is no tenderness.  Musculoskeletal: She exhibits no edema.  Neurological: She is alert and oriented to person, place, and time. No cranial nerve deficit.  Skin: Skin is warm and dry.    Assessment/Plan: 53 yo woman presented with a food impaction of > 24 hours. She had EGD to remove food impaction. She was noted to have a mucosal tear. A CT showed air in the mediastinum and soft tissues of the neck consistent with a perforation.   She had a gastrograffin swallow which was just completed but has not yet been officially read. I do not see any major contrast leak on that study.  I think we will be able to manage her non-operatively  Recommendations  Admit to hospital  NPO, IVF  Broad spectrum antibiotics- e.g. Vanco/ Zosyn  She will need a PICC line for TNA  Melrose Nakayama 09/19/2013, 8:57 AM

## 2013-09-19 NOTE — Progress Notes (Signed)
Received pt from ED . No c/o.Marland KitchenOriented to room. NSR on monitor.

## 2013-09-19 NOTE — Progress Notes (Signed)
Peripherally Inserted Central Catheter/Midline Placement  The IV Nurse has discussed with the patient and/or persons authorized to consent for the patient, the purpose of this procedure and the potential benefits and risks involved with this procedure.  The benefits include less needle sticks, lab draws from the catheter and patient may be discharged home with the catheter.  Risks include, but not limited to, infection, bleeding, blood clot (thrombus formation), and puncture of an artery; nerve damage and irregular heat beat.  Alternatives to this procedure were also discussed.  PICC/Midline Placement Documentation  PICC / Midline Double Lumen 03/17/61 PICC Right Basilic 41 cm 2 cm (Active)  Indication for Insertion or Continuance of Line Prolonged intravenous therapies 09/19/2013  4:21 PM  Exposed Catheter (cm) 2 cm 09/19/2013  4:21 PM  Site Assessment Clean;Dry;Intact 09/19/2013  4:21 PM  Lumen #1 Status Flushed;Saline locked 09/19/2013  4:21 PM  Lumen #2 Status Flushed;Saline locked 09/19/2013  4:21 PM  Dressing Type Transparent 09/19/2013  4:21 PM  Dressing Status Clean;Dry;Intact;Antimicrobial disc in place 09/19/2013  4:21 PM  Line Care Connections checked and tightened 09/19/2013  4:21 PM  Dressing Change Due 09/26/13 09/19/2013  4:21 PM       Mordecai Rasmussen Poff 09/19/2013, 4:26 PM

## 2013-09-19 NOTE — ED Notes (Signed)
Pt taken to CT.

## 2013-09-19 NOTE — Progress Notes (Signed)
ANTIBIOTIC CONSULT NOTE - INITIAL  Pharmacy Consult for vancomycin, Zosyn Indication: esophageal rupture  No Known Allergies  Patient Measurements:  Weight 80.5 kg  Vital Signs: Temp: 98.8 F (37.1 C) (05/26 0955) Temp src: Oral (05/26 0955) BP: 138/67 mmHg (05/26 0955) Pulse Rate: 73 (05/26 0955) Labs:  Recent Labs  09/18/13 2240 09/18/13 2244  WBC 11.0*  --   HGB 12.2 12.2  PLT 320  --   CREATININE  --  1.00   The CrCl is unknown because both a height and weight (above a minimum accepted value) are required for this calculation.  Microbiology: No results found for this or any previous visit (from the past 720 hour(s)).  Medical History: Past Medical History  Diagnosis Date  . Hypertension   . Hyperlipidemia   . MS (multiple sclerosis)   . Back pain     Medications:  Anti-infectives   Start     Dose/Rate Route Frequency Ordered Stop   09/18/13 2245  piperacillin-tazobactam (ZOSYN) IVPB 3.375 g     3.375 g 100 mL/hr over 30 Minutes Intravenous  Once 09/18/13 2231 09/18/13 2358     Assessment: 53 YOF admitted after food impaction >24 hours s/p EGD to remove food impaction found to have a mucosal tear. CT with air consistent with perforation. Gastrograffin swallow without contrast leak. CrCl~ 60-38mL/min. WBC 11, afebrile. Per CVTS, to manage non-operatively. Blood cultures pending.  Goal of Therapy:  Vancomycin trough level 15-20 mcg/ml  Plan:  Zosyn 3.375g IV q8h-  4 hr infusion.  Vancomycin 1g IV q12h.  Follow-up renal function, cultures, and clinical status.   Sloan Leiter, PharmD, BCPS Clinical Pharmacist [phone] 09/19/2013,10:35 AM

## 2013-09-19 NOTE — ED Notes (Signed)
Updated family regarding plan of care - gave husband snack.

## 2013-09-19 NOTE — Progress Notes (Signed)
Temp 100.5 .Marland Kitchenadvised the attending physician. No orders given at this time.

## 2013-09-19 NOTE — Progress Notes (Signed)
[facility] Progress Note  Subjective:  Feels ok.  Has some mild discomfort in her neck and chest.  Objective:  Vital signs in last 24 hours: Temp:  [97.9 F (36.6 C)-98.8 F (37.1 C)] 98.8 F (37.1 C) (05/26 0955) Pulse Rate:  [71-96] 73 (05/26 0955) Resp:  [12-26] 14 (05/26 0955) BP: (106-171)/(52-109) 138/67 mmHg (05/26 0955) SpO2:  [93 %-100 %] 99 % (05/26 0955) Weight:  [177 lb 8 oz (80.513 kg)] 177 lb 8 oz (80.513 kg) (05/26 0955) Last BM Date: 09/18/13 General:  Alert, Well-developed, in NAD Heart:  Regular rate and rhythm; no murmurs Pulm:  CTAB.  No W/R/R Abdomen:  Soft, non-distended. Normal bowel sounds.  Non-tender. Extremities:  Without edema. Neurologic:  Alert and  oriented x4;  grossly normal neurologically. Psych:  Alert and cooperative. Normal mood and affect.  Lab Results:  Recent Labs  09/18/13 2240 09/18/13 2244  WBC 11.0*  --   HGB 12.2 12.2  HCT 36.5 36.0  PLT 320  --    BMET  Recent Labs  09/18/13 2244  NA 145  K 3.5*  CL 103  GLUCOSE 95  BUN 16  CREATININE 1.00   Dg Chest 2 View  09/18/2013   CLINICAL DATA:  Status post procedure; mild mucosal tearing of the esophagus. Assess for pneumomediastinum.  EXAM: CHEST  2 VIEW  COMPARISON:  Chest radiograph performed earlier today at 2:56 p.m.  FINDINGS: Scattered soft tissue air is noted tracking about both sides of the neck, worse on the right, and to a lesser extent about the superior mediastinum. This raises suspicion for either a hypopharyngeal mucosal tear or possibly a tear at the upper esophagus. The remainder of the mediastinum is unremarkable in appearance. Prominent prevertebral soft tissue air is noted on the lateral view.  The lungs are clear bilaterally. No focal consolidation, pleural effusion or pneumothorax is seen.  No acute osseous abnormalities are identified.  IMPRESSION: Scattered soft tissue air tracking about both sides of the neck, worse on the right, and to a  lesser extent about the superior mediastinum. Prominent prevertebral soft tissue air noted on the lateral view. This raises suspicion for either a hypopharyngeal mucosal tear, more likely on the right, or possibly a tear at the upper esophagus.  These results were called by telephone at the time of interpretation on 09/18/2013 at 9:47 PM to Dr. Davonna Belling, who verbally acknowledged these results.   Electronically Signed   By: Garald Balding M.D.   On: 09/18/2013 21:48   Dg Chest 2 View  09/18/2013   CLINICAL DATA:  Right-sided chest pain.  EXAM: CHEST  2 VIEW  COMPARISON:  None.  FINDINGS: Cardiac silhouette is normal in size. The aorta is mildly uncoiled. No mediastinal or hilar masses. No evidence of adenopathy.  Lungs are clear.  No pleural effusion.  No pneumothorax.  Bony thorax is demineralized but intact.  No evidence of an opaque esophageal foreign body.  IMPRESSION: No active cardiopulmonary disease.   Electronically Signed   By: Lajean Manes M.D.   On: 09/18/2013 15:10   Ct Soft Tissue Neck W Contrast  09/19/2013   CLINICAL DATA:  Recent food impaction with perforation  EXAM: CT NECK WITH CONTRAST, CT the chest with contrast  TECHNIQUE: Multidetector CT imaging of the neck and chest was performed using the standard protocol following the bolus administration of intravenous contrast. Additionally a small amount of oral contrast was administered.  CONTRAST:  73mL OMNIPAQUE IOHEXOL 300 MG/ML  SOLN  COMPARISON:  None.  FINDINGS: CT of the neck:  Skull base has contents are within normal limits. The parotid and submandibular glands are unremarkable. There is considerable amount of retropharyngeal a year identified extending throughout the neck from the recent esophageal perforation. A definitive sided perforation is not well appreciated on this exam. No significant lymphadenopathy is noted. The osseous structures are within normal limits with only mild facet hypertrophic changes.  CT of the chest:   The lungs are well aerated bilaterally without evidence of focal infiltrate or sizable effusion. A minimal amount of dependent atelectasis is noted bilaterally. The thoracic aorta and pulmonary artery as visualized are within normal limits. The origins of great vessels are unremarkable.  There is a considerable amount of mediastinal air identified related to the recent esophageal perforation. It extends superiorly into the neck and inferiorly into the upper abdomen surrounding the gastroesophageal junction. No large full thickness tear of the esophagus is identified. A small amount contrast was administered and is noted in the distal esophagus. Middle most soft tissue density is noted within the contrast column which may represent a focal mucosal tear. No extravasation of contrast material into the mediastinum is identified. The visualized upper abdomen is within normal limits. The bony structures demonstrate degenerative change of thoracic spine.  IMPRESSION: CT of the neck: Considerable retropharyngeal air consistent with the recent esophageal perforation. No focal area to suggest a site of perforation is identified.  CT of the chest: Considerable mediastinal air consistent with recent esophageal perforation. There is an area of mucosal irregularity noted just above the gastroesophageal junction which may represent a partial thickness mucosal tear. No frank extravasation of contrast material into the mediastinum is noted.   Electronically Signed   By: Inez Catalina M.D.   On: 09/19/2013 07:15   Ct Chest W Contrast  09/19/2013   CLINICAL DATA:  Recent food impaction with perforation  EXAM: CT NECK WITH CONTRAST, CT the chest with contrast  TECHNIQUE: Multidetector CT imaging of the neck and chest was performed using the standard protocol following the bolus administration of intravenous contrast. Additionally a small amount of oral contrast was administered.  CONTRAST:  52mL OMNIPAQUE IOHEXOL 300 MG/ML  SOLN   COMPARISON:  None.  FINDINGS: CT of the neck:  Skull base has contents are within normal limits. The parotid and submandibular glands are unremarkable. There is considerable amount of retropharyngeal a year identified extending throughout the neck from the recent esophageal perforation. A definitive sided perforation is not well appreciated on this exam. No significant lymphadenopathy is noted. The osseous structures are within normal limits with only mild facet hypertrophic changes.  CT of the chest:  The lungs are well aerated bilaterally without evidence of focal infiltrate or sizable effusion. A minimal amount of dependent atelectasis is noted bilaterally. The thoracic aorta and pulmonary artery as visualized are within normal limits. The origins of great vessels are unremarkable.  There is a considerable amount of mediastinal air identified related to the recent esophageal perforation. It extends superiorly into the neck and inferiorly into the upper abdomen surrounding the gastroesophageal junction. No large full thickness tear of the esophagus is identified. A small amount contrast was administered and is noted in the distal esophagus. Middle most soft tissue density is noted within the contrast column which may represent a focal mucosal tear. No extravasation of contrast material into the mediastinum is identified. The visualized upper abdomen is within normal limits. The bony structures demonstrate degenerative change  of thoracic spine.  IMPRESSION: CT of the neck: Considerable retropharyngeal air consistent with the recent esophageal perforation. No focal area to suggest a site of perforation is identified.  CT of the chest: Considerable mediastinal air consistent with recent esophageal perforation. There is an area of mucosal irregularity noted just above the gastroesophageal junction which may represent a partial thickness mucosal tear. No frank extravasation of contrast material into the mediastinum is  noted.   Electronically Signed   By: Inez Catalina M.D.   On: 09/19/2013 07:15   Dg Esophagus W/water Sol Cm  09/19/2013   CLINICAL DATA:  History of food impaction in distal esophagus. Upper endoscopy revealed a mucosal tear in the distal esophagus, just proximal to a Schatzki's ring.  EXAM: ESOPHOGRAM/BARIUM SWALLOW  TECHNIQUE: Single contrast examination was performed using water-soluble contrast material followed by thin barium.  FLUOROSCOPY TIME:  2 min and 25 seconds.  COMPARISON:  Chest CT from 09/19/2013.  FINDINGS: Pneumomediastinum is observed fluoroscopically.  The patient was initially given sips of water soluble contrast material mall mean monitored fluoroscopically. There was no evidence for contrast extravasation from the esophagus.  Subsequently, thin barium was given by mouth. This was performed in both upright and oblique supine positions. No extravasation of contrast from the distal esophagus, in the region of the endoscopically noted mucosal tear, was observed. There was some subtle mucosal irregularity in the distal esophagus just proximal to a Schatzki's ring that likely represents the area of mucosal injury, but no leak of barium at this location was observed.  As noted endoscopically, the patient does have a Schatzki's ring. There is a small associated hiatal hernia.  IMPRESSION: Small area of mucosal irregularity in the distal esophagus, just proximal to a Schatzki ring. This is compatible with the area of mucosal abnormality identified endoscopically. No leak or extravasation of contrast from the distal esophagus is observed despite repeated swallows water-soluble and thin barium contrast material.  Pneumomediastinum.   Electronically Signed   By: Misty Stanley M.D.   On: 09/19/2013 08:57    Assessment / Plan: -53 yo woman who presented with a food impaction of > 24 hours. She had EGD to remove food impaction on 5/25. She was noted to have a mucosal tear. A CT showed air in the  mediastinum and soft tissues of the neck consistent with a perforation.  Gastrograffin study did not show any leak or extravasation of contrast.  CTS following and are planning to manage non-operatively.  She is NPO with IVF's and IV antibiotics (Vanco and Zosyn).    LOS: 1 day   Laban Emperor. Zehr  09/19/2013, 11:24 AM  Pager number [phone]  GI ATTENDING  Interval data and history reviewed. Patient personally seen. Family in room. She is sitting comfortably. Gastrografin study did not demonstrate free perforation or leak. Conservative management as appropriate. Appreciate thoracic surgery help.  Docia Chuck. Geri Seminole., M.D. [facility] of Gastroenterology

## 2013-09-19 NOTE — ED Notes (Signed)
Dr Hendrickson at bedside.

## 2013-09-19 NOTE — Progress Notes (Signed)
Due to late orders per pharmacy.TPN will be started tomorrow @ 6pm.

## 2013-09-19 NOTE — ED Provider Notes (Signed)
The patient has had persistent discomfort in her chest and neck and back, no fever, IV antibiotics have been given. The case was discussed with the cardiothoracic surgeon as well as with the internal medicine service and gastroenterology who will follow. Internal medicine teaching service to admit the patient to the hospital. CT scan of the chest shows no obvious source of perforation however there is a suggestion that the distal esophagus is the source, Gastrografin swallow study has been ordered stat.    Johnna Acosta, MD 09/19/13 [phone]

## 2013-09-19 NOTE — H&P (Signed)
Date: 09/19/2013               Patient Name:  Pamela Walters MRN: [id number]  DOB: 09-16-1960 Age / Sex: 53 y.o., female   PCP: Fayrene Helper, MD         Medical Service: Internal Medicine Teaching Service         Attending Physician: Dr. Madilyn Fireman, MD    First Contact: Dr. Gordy Levan Pager: [phone]  Second Contact: Dr. Aundra Dubin Pager: [phone]       After Hours (After 5p/  First Contact Pager: [phone]  weekends / holidays): Second Contact Pager: [phone]   Chief Complaint: Esophageal food impaction  History of Present Illness: Patient is a 53 year old woman with a PMH of MS, HTN, LD and chronic back pain, who presented with esophageal food impaction.  Patient states that she was eating dinner at a local restaurant two nights ago, when she noted a piece of chicken became lodged in the middle part of her chest area. She was unable to swallow any food or liquids, and has had multiple vomiting vs "spitting" episodes since. She also has had difficulty swallowing her secretions. Patient came to the ED for the further evaluation yesterday on 09/18/13, and underwent upper endoscopy. "An esophageal mucosal tear"was noted at the level of the food impaction once a meat bolus was removed from the lower esophagus. Patient later reports chest pain and right neck pain. She describes that her chest pain/soreness was in the middle of chest, persistent, sharp, 7/10, radiating to her back. Denies feeling distressed, fever, chills, diaphoresis, heart burn, SOB, dyspnea, palpitation, nausea, abdominal pain, weakness or numbness.  Dnies history of esophageal dysphagia or odynophagia. A follow up CXR and chest CT showed air in the mediastinum and neck. An urgent Gastrografin swallow study confirmed pneumomediastinum but "no leak or extravasation of contrast from the distal esophagus". Patient is admitted and the CVTS is also consulted.     Meds: Current Facility-Administered Medications  Medication Dose  Route Frequency Provider Last Rate Last Dose  . 0.9 %  sodium chloride infusion   Intravenous Continuous Jerene Bears, MD 100 mL/hr at 09/18/13 2352 100 mL/hr at 09/18/13 2352  . 0.9 %  sodium chloride infusion  250 mL Intravenous PRN Echo Propp, MD      . heparin injection 5,000 Units  5,000 Units Subcutaneous 3 times per day Luvia Orzechowski Nicoletta Dress, MD      . ondansetron [facility]) injection 4 mg  4 mg Intravenous Q2H PRN Johnna Acosta, MD   4 mg at 09/19/13 0219  . sodium chloride 0.9 % injection 3 mL  3 mL Intravenous Q12H Teghan Philbin, MD      . sodium chloride 0.9 % injection 3 mL  3 mL Intravenous Q12H Terita Hejl, MD      . sodium chloride 0.9 % injection 3 mL  3 mL Intravenous PRN Charlann Lange, MD        Allergies: Allergies as of 09/18/2013  . (No Known Allergies)   Past Medical History  Diagnosis Date  . Hypertension   . Hyperlipidemia   . MS (multiple sclerosis)   . Back pain    Past Surgical History  Procedure Laterality Date  . Abdominal hysterectomy  2005    fibroids  . Esophagogastroduodenoscopy N/A 09/18/2013    Procedure: ESOPHAGOGASTRODUODENOSCOPY (EGD);  Surgeon: Jerene Bears, MD;  Location: [place];  Service: Gastroenterology;  Laterality: N/A;   Family History  Problem Relation  Age of Onset  . Hypertension Mother   . Hyperlipidemia Mother   . Hypertension Father    History   Social History  . Marital Status: Married    Spouse Name: N/A    Number of Children: N/A  . Years of Education: N/A   Occupational History  . Not on file.   Social History Main Topics  . Smoking status: Never Smoker   . Smokeless tobacco: Not on file  . Alcohol Use: No  . Drug Use: No  . Sexual Activity: Yes    Birth Control/ Protection: Surgical   Other Topics Concern  . Not on file   Social History Narrative  . No narrative on file   Review of Systems:  Constitutional:  Denies fever, chills, diaphoresis, appetite change and fatigue.   HEENT:  Denies congestion, sore throat, rhinorrhea, sneezing, mouth  sores, positive for trouble swallow and neck pain   Respiratory:  Denies SOB, DOE, cough, and wheezing.   Cardiovascular:  Denies palpitations and leg swelling. Positive for chest pain/soreness  Gastrointestinal:  Denies nausea, abdominal pain, diarrhea, constipation, blood in stool and abdominal distention. Positive for vomiting  Genitourinary:  Denies dysuria, urgency, frequency, hematuria, flank pain and difficulty urinating.   Musculoskeletal:  Denies myalgias, joint swelling, arthralgias and gait problem.   Skin:  Denies pallor, rash and wound.   Neurological:  Denies dizziness, seizures, syncope, weakness, light-headedness, numbness and headaches.     Physical Exam: Blood pressure 134/64, pulse 76, temperature 97.9 F (36.6 C), temperature source Oral, resp. rate 24, SpO2 95.00%. General: NAD, alert, well-developed, and cooperative to examination.  Head: normocephalic and atraumatic.  Eyes: vision grossly intact, pupils equal, pupils round, pupils reactive to light, no injection and anicteric.  Mouth: pharynx pink and moist, no erythema, and no exudates.  Neck: supple, full ROM, no thyromegaly, no JVD, and no carotid bruits.  Lungs: normal respiratory effort, no accessory muscle use, normal breath sounds, no crackles, and no wheezes. Heart: normal rate, regular rhythm, no murmur, no gallop, and no rub.  Abdomen: soft, mild epigastric tenderness, normal bowel sounds, no distention, no guarding, no rebound tenderness, no hepatomegaly, and no splenomegaly.  Msk: no joint swelling, no joint warmth, and no redness over joints.  Pulses: 2+ DP/PT pulses bilaterally Extremities: No cyanosis, clubbing, edema Neurologic: alert & oriented X3, cranial nerves II-XII intact, strength normal in all extremities, sensation intact to light touch, and gait normal.  Skin: turgor normal and no rashes.  Psych: Oriented X3, memory intact for recent and remote, normally interactive, good eye contact, not  anxious appearing, and not depressed appearing.    Lab results: Basic Metabolic Panel:  Recent Labs  09/18/13 2244  Acquanetta Cabanilla 145  K 3.5*  CL 103  GLUCOSE 95  BUN 16  CREATININE 1.00   Liver Function Tests: No results found for this basename: AST, ALT, ALKPHOS, BILITOT, PROT, ALBUMIN,  in the last 72 hours No results found for this basename: LIPASE, AMYLASE,  in the last 72 hours No results found for this basename: AMMONIA,  in the last 72 hours CBC:  Recent Labs  09/18/13 2240 09/18/13 2244  WBC 11.0*  --   NEUTROABS 8.2*  --   HGB 12.2 12.2  HCT 36.5 36.0  MCV 80.4  --   PLT 320  --    Cardiac Enzymes: No results found for this basename: CKTOTAL, CKMB, CKMBINDEX, TROPONINI,  in the last 72 hours BNP: No results found for this basename: PROBNP,  in the last 72 hours D-Dimer: No results found for this basename: DDIMER,  in the last 72 hours CBG: No results found for this basename: GLUCAP,  in the last 72 hours Hemoglobin A1C: No results found for this basename: HGBA1C,  in the last 72 hours Fasting Lipid Panel: No results found for this basename: CHOL, HDL, LDLCALC, TRIG, CHOLHDL, LDLDIRECT,  in the last 72 hours Thyroid Function Tests: No results found for this basename: TSH, T4TOTAL, FREET4, T3FREE, THYROIDAB,  in the last 72 hours Anemia Panel: No results found for this basename: VITAMINB12, FOLATE, FERRITIN, TIBC, IRON, RETICCTPCT,  in the last 72 hours Coagulation: No results found for this basename: LABPROT, INR,  in the last 72 hours Urine Drug Screen: Drugs of Abuse  No results found for this basename: labopia,  cocainscrnur,  labbenz,  amphetmu,  thcu,  labbarb    Alcohol Level: No results found for this basename: ETH,  in the last 72 hours Urinalysis: No results found for this basename: COLORURINE, APPERANCEUR, LABSPEC, PHURINE, GLUCOSEU, HGBUR, BILIRUBINUR, KETONESUR, PROTEINUR, UROBILINOGEN, NITRITE, LEUKOCYTESUR,  in the last 72 hours  Imaging  results:  Dg Chest 2 View  09/18/2013   CLINICAL DATA:  Status post procedure; mild mucosal tearing of the esophagus. Assess for pneumomediastinum.  EXAM: CHEST  2 VIEW  COMPARISON:  Chest radiograph performed earlier today at 2:56 p.m.  FINDINGS: Scattered soft tissue air is noted tracking about both sides of the neck, worse on the right, and to a lesser extent about the superior mediastinum. This raises suspicion for either a hypopharyngeal mucosal tear or possibly a tear at the upper esophagus. The remainder of the mediastinum is unremarkable in appearance. Prominent prevertebral soft tissue air is noted on the lateral view.  The lungs are clear bilaterally. No focal consolidation, pleural effusion or pneumothorax is seen.  No acute osseous abnormalities are identified.  IMPRESSION: Scattered soft tissue air tracking about both sides of the neck, worse on the right, and to a lesser extent about the superior mediastinum. Prominent prevertebral soft tissue air noted on the lateral view. This raises suspicion for either a hypopharyngeal mucosal tear, more likely on the right, or possibly a tear at the upper esophagus.  These results were called by telephone at the time of interpretation on 09/18/2013 at 9:47 PM to Dr. NATHAN PICKERING, who verbally acknowledged these results.   Electronically Signed   By: Jeffery  Chang M.D.   On: 09/18/2013 21:48   Dg Chest 2 View  09/18/2013   CLINICAL DATA:  Right-sided chest pain.  EXAM: CHEST  2 VIEW  COMPARISON:  None.  FINDINGS: Cardiac silhouette is normal in size. The aorta is mildly uncoiled. No mediastinal or hilar masses. No evidence of adenopathy.  Lungs are clear.  No pleural effusion.  No pneumothorax.  Bony thorax is demineralized but intact.  No evidence of an opaque esophageal foreign body.  IMPRESSION: No active cardiopulmonary disease.   Electronically Signed   By: David  Ormond M.D.   On: 09/18/2013 15:10   Ct Soft Tissue Neck W Contrast  09/19/2013    CLINICAL DATA:  RecBreck Coons impaction with perforation  [id number]  CT of the neck:  Skull base has contents are within normal limits. The parotid and submandibular glands are unremarkable. There is considerable amount of retropharyngeal a year identified extending throughout the neck from the recent esophageal perforation. A definitive sided perforation is not well appreciated on this exam. No significant lymphadenopathy is noted. The osseous structures are within normal limits with only mild facet hypertrophic changes.  CT of the chest:  The lungs are well aerated bilaterally without evidence of focal infiltrate or sizable effusion. A minimal amount of dependent atelectasis is noted bilaterally. The thoracic aorta and pulmonary artery as visualized are within normal limits. The origins of great vessels are unremarkable.  There is a considerable amount of mediastinal air identified related to the recent esophageal perforation. It extends superiorly into the neck and inferiorly into the upper abdomen surrounding the gastroesophageal junction. No large full thickness tear of the esophagus is identified. A small amount contrast was administered and is noted in the distal esophagus. Middle most soft tissue density is noted within the contrast column which may represent a focal mucosal tear. No extravasation of contrast material into the mediastinum is identified. The visualized upper abdomen is within normal limits. The bony structures demonstrate degenerative change of thoracic spine.  IMPRESSION: CT of the neck: Considerable retropharyngeal air consistent with the recent  esophageal perforation. No focal area to suggest a site of perforation is identified.  CT of the chest: Considerable mediastinal air consistent with recent esophageal perforation. There is an area of mucosal irregularity noted just above the gastroesophageal junction which may represent a partial thickness mucosal tear. No frank extravasation of contrast material into the mediastinum is noted.   Electronically Signed   By: Inez Catalina M.D.   On: 09/19/2013 07:15   Ct Chest W Contrast  09/19/2013   CLINICAL DATA:  Recent food impaction with perforation  EXAM: CT NECK WITH CONTRAST, CT the chest with contrast  TECHNIQUE: Multidetector CT imaging of the neck and chest was performed using the standard protocol following the bolus administration of intravenous contrast. Additionally a small amount of oral contrast was administered.  CONTRAST:  78mL OMNIPAQUE IOHEXOL 300 MG/ML  SOLN  COMPARISON:  None.  FINDINGS: CT of the neck:  Skull base has contents are within normal limits. The parotid and submandibular glands are unremarkable. There is considerable amount of retropharyngeal a year identified extending throughout the neck from the recent esophageal perforation. A definitive sided perforation is not well appreciated on this exam. No significant lymphadenopathy is noted. The osseous structures are within normal limits with only mild facet hypertrophic changes.  CT of the chest:  The lungs are well aerated bilaterally without evidence of focal infiltrate or sizable effusion. A minimal amount of dependent atelectasis is noted bilaterally. The thoracic aorta and pulmonary artery as visualized are within normal limits. The origins of great vessels are unremarkable.  There is a considerable amount of mediastinal air identified related to the recent esophageal perforation. It extends superiorly into the neck and inferiorly into the upper abdomen surrounding the gastroesophageal junction. No large full thickness tear of  the esophagus is identified. A small amount contrast was administered and is noted in the distal esophagus. Middle most soft tissue density is noted within the contrast column which may represent a focal mucosal tear. No extravasation of contrast material into the mediastinum is identified. The visualized upper abdomen is within normal limits. The bony structures demonstrate degenerative change of thoracic spine.  IMPRESSION: CT of  the neck: Considerable retropharyngeal air consistent with the recent esophageal perforation. No focal area to suggest a site of perforation is identified.  CT of the chest: Considerable mediastinal air consistent with recent esophageal perforation. There is an area of mucosal irregularity noted just above the gastroesophageal junction which may represent a partial thickness mucosal tear. No frank extravasation of contrast material into the mediastinum is noted.   Electronically Signed   By: Inez Catalina M.D.   On: 09/19/2013 07:15   Dg Esophagus W/water Sol Cm  09/19/2013   CLINICAL DATA:  History of food impaction in distal esophagus. Upper endoscopy revealed a mucosal tear in the distal esophagus, just proximal to a Schatzki's ring.  EXAM: ESOPHOGRAM/BARIUM SWALLOW  TECHNIQUE: Single contrast examination was performed using water-soluble contrast material followed by thin barium.  FLUOROSCOPY TIME:  2 min and 25 seconds.  COMPARISON:  Chest CT from 09/19/2013.  FINDINGS: Pneumomediastinum is observed fluoroscopically.  The patient was initially given sips of water soluble contrast material mall mean monitored fluoroscopically. There was no evidence for contrast extravasation from the esophagus.  Subsequently, thin barium was given by mouth. This was performed in both upright and oblique supine positions. No extravasation of contrast from the distal esophagus, in the region of the endoscopically noted mucosal tear, was observed. There was some subtle mucosal irregularity in the  distal esophagus just proximal to a Schatzki's ring that likely represents the area of mucosal injury, but no leak of barium at this location was observed.  As noted endoscopically, the patient does have a Schatzki's ring. There is a small associated hiatal hernia.  IMPRESSION: Small area of mucosal irregularity in the distal esophagus, just proximal to a Schatzki ring. This is compatible with the area of mucosal abnormality identified endoscopically. No leak or extravasation of contrast from the distal esophagus is observed despite repeated swallows water-soluble and thin barium contrast material.  Pneumomediastinum.   Electronically Signed   By: Misty Stanley M.D.   On: 09/19/2013 08:57     Assessment & Plan by Problem: Patient is a 53 year old woman with a PMH of MS, HTN, LD and chronic back pain, who presented with esophageal food impaction. CXR and CT showed air in neck and mediastinum.   # Esophageal perforation # Esophageal obstruction due to food impaction # Chest pain # Air in the soft tissue of neck # Pneumomediastinum    Patient presented with one episode of esophageal food impaction over 24 hours.  She underwent upper endoscopy, and was noted to have esophageal mucosa tear once the food was removed.  She later developed chest pain and right neck pain, and was noted to have in the soft tissue of neck and the mediastinum, which is consistent with esophageal perforation. The characteristics of her chest pain is also consistent with esophageal perforation.  However, given her multiple CV risk factors, will obtain EKG and trend troponins. Geneva score is 3 by HR of 81, low probability for PE. No clinical indications for PNA or pericarditis.  Plan - Admit to SDU for close monitor - CVTS consulted>>>recs medical management for now - GI consulted - strict NPO>>absolutely avoid all oral intake - IVF - Blood cultures x 2>>>already received one dose of Zosyn at the ED - Lactic acid pending -  Trend Troponins - Follow EKG - Broad spectrum Abx>>>Vancomycin and Zosyn - Intravenous PPI BID - Parenteral nutrition support>>>PICC line insertion for TNA - will need speech evaluation once she is stabilized.  -  close monitor for surgical indications such as:   If developed free diffuse extravasation    Extension of perforation  Clinical deterioration such as fever, sepsis, worsening of pneumomediastinum or development of pneumothorax or empyema.   # Hypertension and Hyperlipidemia, stable. On home Maxzide and Pravachol  - will hold all oral meds  # DEPRESSION, stable. On home Prozac - hold oral meds  # MULTIPLE SCLEROSIS, PROGRESSIVE/RELAPSING - Not on home meds. Again, will need thorough speech eval once stabilized.   # VTE: Heparin SQ and SCD - Protamine reverse if urgent surgery needed.   Dispo: Disposition is deferred at this time, awaiting improvement of current medical problems. Anticipated discharge in approximately 3-4 day(s).   The patient does have a current PCP Fayrene Helper, MD) and does not need an [facility] hospital follow-up appointment after discharge.  The patient does not have transportation limitations that hinder transportation to clinic appointments.  Signed: Charlann Lange, MDPGY-3 09/19/2013, 10:22 AM

## 2013-09-20 ENCOUNTER — Inpatient Hospital Stay (HOSPITAL_COMMUNITY): Payer: BC Managed Care – PPO

## 2013-09-20 LAB — MAGNESIUM: Magnesium: 1.8 mg/dL (ref 1.5–2.5)

## 2013-09-20 LAB — GLUCOSE, CAPILLARY
GLUCOSE-CAPILLARY: 95 mg/dL (ref 70–99)
Glucose-Capillary: 76 mg/dL (ref 70–99)
Glucose-Capillary: 75 mg/dL (ref 70–99)
Glucose-Capillary: 72 mg/dL (ref 70–99)

## 2013-09-20 LAB — CBC WITH DIFFERENTIAL/PLATELET
Basophils Absolute: 0 10*3/uL (ref 0.0–0.1)
Basophils Relative: 0 % (ref 0–1)
EOS ABS: 0.1 10*3/uL (ref 0.0–0.7)
Eosinophils Relative: 1 % (ref 0–5)
HCT: 33 % — ABNORMAL LOW (ref 36.0–46.0)
HEMOGLOBIN: 10.9 g/dL — AB (ref 12.0–15.0)
Lymphocytes Relative: 18 % (ref 12–46)
Lymphs Abs: 1.4 10*3/uL (ref 0.7–4.0)
MCH: 26.9 pg (ref 26.0–34.0)
MCHC: 33 g/dL (ref 30.0–36.0)
MCV: 81.5 fL (ref 78.0–100.0)
MONO ABS: 0.7 10*3/uL (ref 0.1–1.0)
MONOS PCT: 9 % (ref 3–12)
NEUTROS ABS: 5.5 10*3/uL (ref 1.7–7.7)
Neutrophils Relative %: 72 % (ref 43–77)
Platelets: 249 10*3/uL (ref 150–400)
RBC: 4.05 MIL/uL (ref 3.87–5.11)
RDW: 16.2 % — ABNORMAL HIGH (ref 11.5–15.5)
WBC: 7.6 10*3/uL (ref 4.0–10.5)

## 2013-09-20 LAB — COMPREHENSIVE METABOLIC PANEL
ALT: 12 U/L (ref 0–35)
AST: 20 U/L (ref 0–37)
Albumin: 3.3 g/dL — ABNORMAL LOW (ref 3.5–5.2)
Alkaline Phosphatase: 52 U/L (ref 39–117)
BILIRUBIN TOTAL: 0.7 mg/dL (ref 0.3–1.2)
BUN: 11 mg/dL (ref 6–23)
CHLORIDE: 102 meq/L (ref 96–112)
CO2: 21 mEq/L (ref 19–32)
CREATININE: 0.92 mg/dL (ref 0.50–1.10)
Calcium: 8.5 mg/dL (ref 8.4–10.5)
GFR calc Af Amer: 80 mL/min — ABNORMAL LOW (ref >90)
GFR calc non Af Amer: 69 mL/min — ABNORMAL LOW (ref >90)
Glucose, Bld: 74 mg/dL (ref 70–99)
Potassium: 3.7 mEq/L (ref 3.7–5.3)
Sodium: 141 mEq/L (ref 137–147)
Total Protein: 6.4 g/dL (ref 6.0–8.3)

## 2013-09-20 LAB — TRIGLYCERIDES: Triglycerides: 189 mg/dL — ABNORMAL HIGH (ref <150)

## 2013-09-20 LAB — PHOSPHORUS: Phosphorus: 2.7 mg/dL (ref 2.3–4.6)

## 2013-09-20 LAB — PREALBUMIN: Prealbumin: 17.4 mg/dL — ABNORMAL LOW (ref 17.0–34.0)

## 2013-09-20 MED ORDER — FAT EMULSION 20 % IV EMUL
250.0000 mL | INTRAVENOUS | Status: AC
  Administered 2013-09-20: 250 mL via INTRAVENOUS
  Filled 2013-09-20: qty 250

## 2013-09-20 MED ORDER — INSULIN ASPART 100 UNIT/ML ~~LOC~~ SOLN: 0.0000 [IU] | SUBCUTANEOUS | Status: DC

## 2013-09-20 MED ORDER — SODIUM CHLORIDE 0.9 % IV SOLN: INTRAVENOUS | Status: AC

## 2013-09-20 MED ORDER — MORPHINE SULFATE 2 MG/ML IJ SOLN
2.0000 mg | INTRAMUSCULAR | Status: DC | PRN
  Administered 2013-09-20 – 2013-09-22 (×3): 2 mg via INTRAVENOUS
  Filled 2013-09-20 (×3): qty 1

## 2013-09-20 MED ORDER — TRACE MINERALS CR-CU-F-FE-I-MN-MO-SE-ZN IV SOLN
INTRAVENOUS | Status: AC
  Administered 2013-09-20: 17:00:00 via INTRAVENOUS
  Filled 2013-09-20: qty 1000

## 2013-09-20 NOTE — Progress Notes (Signed)
Subjective:   Days of hospitalization: 2  VSS.  Afebrile, tmax 100.5.  Pt without new complaints this AM.  No overnight events.     Objective:   Vital signs in last 24 hours: Filed Vitals:   09/20/13 1600 09/20/13 1700 09/20/13 1800 09/20/13 1900  BP: 132/55 138/65 146/73 143/68  Pulse: 72 74 73 71  Temp: 97.5 F (36.4 C)     TempSrc: Oral     Resp: 25 27 27 21   Height:      Weight:      SpO2: 95% 97% 95% 95%    Weight: Filed Weights   09/19/13 0955 09/20/13 0500  Weight: 177 lb 8 oz (80.513 kg) 181 lb 7 oz (82.3 kg)    Ins/Outs:  Intake/Output Summary (Last 24 hours) at 09/20/13 2028 Last data filed at 09/20/13 1900  Gross per 24 hour  Intake   1850 ml  Output   1400 ml  Net    450 ml    Physical Exam: Constitutional: Vital signs reviewed.  Patient is sitting up in bed in no acute distress and cooperative with exam.   HEENT: [place]/AT; PERRL, EOMI, conjunctivae normal/pale, no scleral icterus  Cardiovascular: RRR, no MRG Pulmonary/Chest: normal respiratory effort, no accessory muscle use, CTAB, no wheezes, rales, or rhonchi Abdominal: Soft. +BS, NT/ND Neurological: A&O x3, CN II_XII grossly intact; non-focal exam Extremities: 2+DP b/l, no C/C/E  Skin: Warm, dry and intact. No rash  Lab Results:  BMP:  Recent Labs Lab 09/19/13 1100 09/20/13 0500  NA 143 141  K 3.8 3.7  CL 107 102  CO2 24 21  GLUCOSE 82 74  BUN 14 11  CREATININE 0.94 0.92  CALCIUM 8.8 8.5  MG 2.0 1.8  PHOS  --  2.7   Anion Gap:  18  CBC:  Recent Labs Lab 09/18/13 2240 09/18/13 2244 09/20/13 0500  WBC 11.0*  --  7.6  NEUTROABS 8.2*  --  5.5  HGB 12.2 12.2 10.9*  HCT 36.5 36.0 33.0*  MCV 80.4  --  81.5  PLT 320  --  249    Coagulation:  Recent Labs Lab 09/19/13 1100  LABPROT 14.0  INR 1.10    CBG:          Recent Labs Lab 09/20/13 0821 09/20/13 1246 09/20/13 1646  GLUCAP 76 75 72           HA1C:      No results found for this basename: HGBA1C,   in the last 168 hours  Lipid Panel:  Recent Labs Lab 09/20/13 0500  TRIG 189*    LFTs:  Recent Labs Lab 09/19/13 1100 09/20/13 0500  AST 17 20  ALT 12 12  ALKPHOS 54 52  BILITOT 0.7 0.7  PROT 6.6 6.4  ALBUMIN 3.4* 3.3*    Pancreatic Enzymes: No results found for this basename: LIPASE, AMYLASE,  in the last 168 hours  Lactic Acid/Procalcitonin:  Recent Labs Lab 09/19/13 1100  LATICACIDVEN 1.0    Ammonia: No results found for this basename: AMMONIA,  in the last 168 hours  Cardiac Enzymes:  Recent Labs Lab 09/19/13 1100 09/19/13 1725 09/19/13 2145  TROPONINI <0.30 <0.30 <0.30    EKG: EKG Interpretation  Date/Time:    Ventricular Rate:    PR Interval:    QRS Duration:   QT Interval:    QTC Calculation:   R Axis:     Text Interpretation:     BNP: No results found for  this basename: PROBNP,  in the last 168 hours  D-Dimer: No results found for this basename: DDIMER,  in the last 168 hours  Urinalysis: No results found for this basename: COLORURINE, APPERANCEUR, LABSPEC, PHURINE, GLUCOSEU, HGBUR, BILIRUBINUR, KETONESUR, PROTEINUR, UROBILINOGEN, NITRITE, LEUKOCYTESUR,  in the last 168 hours  Micro Results: Recent Results (from the past 240 hour(s))  MRSA PCR SCREENING     Status: Abnormal   Collection Time    09/19/13 10:07 AM      Result Value Ref Range Status   MRSA by PCR POSITIVE (*) NEGATIVE Final   Comment:            The GeneXpert MRSA Assay (FDA     approved for NASAL specimens     only), is one component of a     comprehensive MRSA colonization     surveillance program. It is not     intended to diagnose MRSA     infection nor to guide or     monitor treatment for     MRSA infections.     RESULT CALLED TO, READ BACK BY AND VERIFIED WITH:     WHITE RN 11:50 09/19/13 ([email or web address])  CULTURE, BLOOD (ROUTINE X 2)     Status: None   Collection Time    09/19/13 11:00 AM      Result Value Ref Range Status   Specimen Description BLOOD  LEFT ANTECUBITAL   Final   Special Requests BOTTLES DRAWN AEROBIC ONLY 3CC   Final   Culture  Setup Time     Final   Value: 09/19/2013 16:25     Performed at [facility]   Culture     Final   Value:        BLOOD CULTURE RECEIVED NO GROWTH TO DATE CULTURE WILL BE HELD FOR 5 DAYS BEFORE ISSUING A FINAL NEGATIVE REPORT     Performed at [facility]   Report Status PENDING   Incomplete  CULTURE, BLOOD (ROUTINE X 2)     Status: None   Collection Time    09/19/13 11:14 AM      Result Value Ref Range Status   Specimen Description BLOOD LEFT HAND   Final   Special Requests BOTTLES DRAWN AEROBIC ONLY 5CC   Final   Culture  Setup Time     Final   Value: 09/19/2013 16:25     Performed at [facility]   Culture     Final   Value:        BLOOD CULTURE RECEIVED NO GROWTH TO DATE CULTURE WILL BE HELD FOR 5 DAYS BEFORE ISSUING A FINAL NEGATIVE REPORT     Performed at [facility]   Report Status PENDING   Incomplete    Blood Culture:    Component Value Date/Time   SDES BLOOD LEFT HAND 09/19/2013 1114    Studies/Results: X-ray Chest Pa And Lateral   09/20/2013   CLINICAL DATA:  Followup esophageal rupture.  EXAM: CHEST  2 VIEW  COMPARISON:  09/18/2013  FINDINGS: Pneumomediastinum and neck base air is less apparent on the current study than on the prior exam.  No pneumothorax.  Lungs are clear.  Minimal pleural effusions.  Cardiac silhouette is normal in size. Normal mediastinal and hilar contours. Right PICC tip lies at the caval atrial junction.  IMPRESSION: Pneumomediastinum and neck base air appears less prominent. No acute findings in the lungs. No pneumothorax. The right PICC is well positioned.   Electronically  Signed   By: Lajean Manes M.D.   On: 09/20/2013 08:03   Dg Chest 2 View  09/18/2013   CLINICAL DATA:  Status post procedure; mild mucosal tearing of the esophagus. Assess for pneumomediastinum.  EXAM: CHEST  2 VIEW  COMPARISON:  Chest radiograph  performed earlier today at 2:56 p.m.  FINDINGS: Scattered soft tissue air is noted tracking about both sides of the neck, worse on the right, and to a lesser extent about the superior mediastinum. This raises suspicion for either a hypopharyngeal mucosal tear or possibly a tear at the upper esophagus. The remainder of the mediastinum is unremarkable in appearance. Prominent prevertebral soft tissue air is noted on the lateral view.  The lungs are clear bilaterally. No focal consolidation, pleural effusion or pneumothorax is seen.  No acute osseous abnormalities are identified.  IMPRESSION: Scattered soft tissue air tracking about both sides of the neck, worse on the right, and to a lesser extent about the superior mediastinum. Prominent prevertebral soft tissue air noted on the lateral view. This raises suspicion for either a hypopharyngeal mucosal tear, more likely on the right, or possibly a tear at the upper esophagus.  These results were called by telephone at the time of interpretation on 09/18/2013 at 9:47 PM to Dr. Davonna Belling, who verbally acknowledged these results.   Electronically Signed   By: Garald Balding M.D.   On: 09/18/2013 21:48   Ct Soft Tissue Neck W Contrast  09/19/2013   CLINICAL DATA:  Recent food impaction with perforation  EXAM: CT NECK WITH CONTRAST, CT the chest with contrast  TECHNIQUE: Multidetector CT imaging of the neck and chest was performed using the standard protocol following the bolus administration of intravenous contrast. Additionally a small amount of oral contrast was administered.  CONTRAST:  51mL OMNIPAQUE IOHEXOL 300 MG/ML  SOLN  COMPARISON:  None.  FINDINGS: CT of the neck:  Skull base has contents are within normal limits. The parotid and submandibular glands are unremarkable. There is considerable amount of retropharyngeal a year identified extending throughout the neck from the recent esophageal perforation. A definitive sided perforation is not well appreciated  on this exam. No significant lymphadenopathy is noted. The osseous structures are within normal limits with only mild facet hypertrophic changes.  CT of the chest:  The lungs are well aerated bilaterally without evidence of focal infiltrate or sizable effusion. A minimal amount of dependent atelectasis is noted bilaterally. The thoracic aorta and pulmonary artery as visualized are within normal limits. The origins of great vessels are unremarkable.  There is a considerable amount of mediastinal air identified related to the recent esophageal perforation. It extends superiorly into the neck and inferiorly into the upper abdomen surrounding the gastroesophageal junction. No large full thickness tear of the esophagus is identified. A small amount contrast was administered and is noted in the distal esophagus. Middle most soft tissue density is noted within the contrast column which may represent a focal mucosal tear. No extravasation of contrast material into the mediastinum is identified. The visualized upper abdomen is within normal limits. The bony structures demonstrate degenerative change of thoracic spine.  IMPRESSION: CT of the neck: Considerable retropharyngeal air consistent with the recent esophageal perforation. No focal area to suggest a site of perforation is identified.  CT of the chest: Considerable mediastinal air consistent with recent esophageal perforation. There is an area of mucosal irregularity noted just above the gastroesophageal junction which may represent a partial thickness mucosal tear. No frank  extravasation of contrast material into the mediastinum is noted.   Electronically Signed   By: Inez Catalina M.D.   On: 09/19/2013 07:15   Ct Chest W Contrast  09/19/2013   CLINICAL DATA:  Recent food impaction with perforation  EXAM: CT NECK WITH CONTRAST, CT the chest with contrast  TECHNIQUE: Multidetector CT imaging of the neck and chest was performed using the standard protocol following the  bolus administration of intravenous contrast. Additionally a small amount of oral contrast was administered.  CONTRAST:  72mL OMNIPAQUE IOHEXOL 300 MG/ML  SOLN  COMPARISON:  None.  FINDINGS: CT of the neck:  Skull base has contents are within normal limits. The parotid and submandibular glands are unremarkable. There is considerable amount of retropharyngeal a year identified extending throughout the neck from the recent esophageal perforation. A definitive sided perforation is not well appreciated on this exam. No significant lymphadenopathy is noted. The osseous structures are within normal limits with only mild facet hypertrophic changes.  CT of the chest:  The lungs are well aerated bilaterally without evidence of focal infiltrate or sizable effusion. A minimal amount of dependent atelectasis is noted bilaterally. The thoracic aorta and pulmonary artery as visualized are within normal limits. The origins of great vessels are unremarkable.  There is a considerable amount of mediastinal air identified related to the recent esophageal perforation. It extends superiorly into the neck and inferiorly into the upper abdomen surrounding the gastroesophageal junction. No large full thickness tear of the esophagus is identified. A small amount contrast was administered and is noted in the distal esophagus. Middle most soft tissue density is noted within the contrast column which may represent a focal mucosal tear. No extravasation of contrast material into the mediastinum is identified. The visualized upper abdomen is within normal limits. The bony structures demonstrate degenerative change of thoracic spine.  IMPRESSION: CT of the neck: Considerable retropharyngeal air consistent with the recent esophageal perforation. No focal area to suggest a site of perforation is identified.  CT of the chest: Considerable mediastinal air consistent with recent esophageal perforation. There is an area of mucosal irregularity noted  just above the gastroesophageal junction which may represent a partial thickness mucosal tear. No frank extravasation of contrast material into the mediastinum is noted.   Electronically Signed   By: Inez Catalina M.D.   On: 09/19/2013 07:15   Dg Esophagus W/water Sol Cm  09/19/2013   CLINICAL DATA:  History of food impaction in distal esophagus. Upper endoscopy revealed a mucosal tear in the distal esophagus, just proximal to a Schatzki's ring.  EXAM: ESOPHOGRAM/BARIUM SWALLOW  TECHNIQUE: Single contrast examination was performed using water-soluble contrast material followed by thin barium.  FLUOROSCOPY TIME:  2 min and 25 seconds.  COMPARISON:  Chest CT from 09/19/2013.  FINDINGS: Pneumomediastinum is observed fluoroscopically.  The patient was initially given sips of water soluble contrast material mall mean monitored fluoroscopically. There was no evidence for contrast extravasation from the esophagus.  Subsequently, thin barium was given by mouth. This was performed in both upright and oblique supine positions. No extravasation of contrast from the distal esophagus, in the region of the endoscopically noted mucosal tear, was observed. There was some subtle mucosal irregularity in the distal esophagus just proximal to a Schatzki's ring that likely represents the area of mucosal injury, but no leak of barium at this location was observed.  As noted endoscopically, the patient does have a Schatzki's ring. There is a small associated hiatal hernia.  IMPRESSION: Small area of mucosal irregularity in the distal esophagus, just proximal to a Schatzki ring. This is compatible with the area of mucosal abnormality identified endoscopically. No leak or extravasation of contrast from the distal esophagus is observed despite repeated swallows water-soluble and thin barium contrast material.  Pneumomediastinum.   Electronically Signed   By: Misty Stanley M.D.   On: 09/19/2013 08:57    Medications:  Scheduled Meds: .  Chlorhexidine Gluconate Cloth  6 each Topical Q0600  . heparin  5,000 Units Subcutaneous 3 times per day  . insulin aspart  0-9 Units Subcutaneous 6 times per day  . mupirocin ointment  1 application Nasal BID  . pantoprazole (PROTONIX) IV  40 mg Intravenous Q12H  . piperacillin-tazobactam (ZOSYN)  IV  3.375 g Intravenous Q8H  . sodium chloride  3 mL Intravenous Q12H  . sodium chloride  3 mL Intravenous Q12H  . vancomycin  1,000 mg Intravenous Q12H   Continuous Infusions: . sodium chloride 60 mL/hr at 09/20/13 1810  . Marland KitchenTPN (CLINIMIX-E) Adult 40 mL/hr at 09/20/13 1701   And  . fat emulsion 250 mL (09/20/13 1700)   PRN Meds: sodium chloride, LORazepam, morphine injection, ondansetron (ZOFRAN) IV, sodium chloride, sodium chloride  Antibiotics: Antibiotics Given (last 72 hours)   Date/Time Action Medication Dose Rate   09/19/13 1335 Given   vancomycin (VANCOCIN) IVPB 1000 mg/200 mL premix 1,000 mg 200 mL/hr   09/19/13 1505 Given   piperacillin-tazobactam (ZOSYN) IVPB 3.375 g 3.375 g 12.5 mL/hr   09/19/13 1506 Given   piperacillin-tazobactam (ZOSYN) IVPB 3.375 g 3.375 g 12.5 mL/hr   09/19/13 2156 Given   vancomycin (VANCOCIN) IVPB 1000 mg/200 mL premix 1,000 mg 200 mL/hr   09/20/13 0236 Given   piperacillin-tazobactam (ZOSYN) IVPB 3.375 g 3.375 g 12.5 mL/hr   09/20/13 0945 Given   vancomycin (VANCOCIN) IVPB 1000 mg/200 mL premix 1,000 mg 200 mL/hr   09/20/13 1047 Given   piperacillin-tazobactam (ZOSYN) IVPB 3.375 g 3.375 g 12.5 mL/hr   09/20/13 1809 Given   piperacillin-tazobactam (ZOSYN) IVPB 3.375 g 3.375 g 12.5 mL/hr      Day of Hospitalization: 2  Consults: Treatment Team:  Melrose Nakayama, MD  Assessment/Plan:   Principal Problem:   Esophageal obstruction due to food impaction Active Problems:   HYPERLIPIDEMIA   Obesity, unspecified   DEPRESSION   MULTIPLE SCLEROSIS, PROGRESSIVE/RELAPSING   HYPERTENSION   Chest pain   Esophageal rupture  Esophageal  perforation complicated by pneumomediastinum  Pt p/w with esophageal food impaction and underwent upper endoscopy and had esophageal mucosa tear.  Later developing CP and R neck pain. Given multiple CV risk factors, EKG obtained and trended troponins.  CVTS consulted and medical management for now.    -transfer from SDU-->tele -continue strict NPO>>absolutely avoid all oral intake  -IVF   -Blood cultures x 2 pending>>>already received one dose of Zosyn at the ED  -Broad spectrum Abx>>>Vancomycin and Zosyn  -Intravenous PPI BID  -continue PICC line for TNA  -will need speech evaluation once she is stabilized  Hypertension On home maxzide and pravachol  Dyslipidemia -hold all oral meds   Depression -on home prozac; hold for now  h/o MS -Not on home meds  VTE PPx  5000 Units Heparin SQ tid  Disposition Disposition is deferred, awaiting improvement of current medical problems.  Anticipated discharge in approximately 1-2 day(s).     LOS: 2 days   Jones Bales, MD PGY-1, Internal Medicine Teaching Service [phone] (7AM-5PM Mon-Fri)  09/20/2013, 8:28 PM

## 2013-09-20 NOTE — Progress Notes (Signed)
INITIAL NUTRITION ASSESSMENT  DOCUMENTATION CODES Per approved criteria  -Obesity Unspecified   INTERVENTION:  TPN per pharmacy RD to follow for nutrition care plan  NUTRITION DIAGNOSIS: Inadequate oral intake related to altered GI function as evidenced by NPO status  Goal: Pt to meet >/= 90% of their estimated nutrition needs   Monitor:  TPN prescription, weight, labs, I/O's  Reason for Assessment: Consult  53 y.o. female  Admitting Dx: Esophageal obstruction due to food impaction  ASSESSMENT: 53 y.o. Female with a PMH of MS who presented with esophageal food impaction.   Patient underwent upper endoscopy 5/25 which revealed an esophageal mucosal tear at the level of the food impaction once a meat bolus was removed from the lower esophagus.    Patient's appetite good PTA; weight has been stable; no muscle or subcutaneous fat depletion noticed.  RD consulted for new TPN.  Patient is receiving TPN via PICC line with Clinimix E 5/15 @ 40 ml/hr and lipids @ 5 ml/hr.  Provides 922 kcal and 48 grams protein per day.  Meets 51% minimum estimated energy needs and 53% minimum estimated protein needs.  Height: Ht Readings from Last 1 Encounters:  09/19/13 5\' 3"  (1.6 m)    Weight: Wt Readings from Last 1 Encounters:  09/20/13 181 lb 7 oz (82.3 kg)    Ideal Body Weight: 115 lb  % Ideal Body Weight: 157%  Wt Readings from Last 10 Encounters:  09/20/13 181 lb 7 oz (82.3 kg)  09/20/13 181 lb 7 oz (82.3 kg)  09/20/13 181 lb 7 oz (82.3 kg)  03/17/13 182 lb 6.4 oz (82.736 kg)  01/30/13 185 lb (83.915 kg)  05/27/10 174 lb 12 oz (79.266 kg)  12/16/09 186 lb (84.369 kg)  04/04/08 185 lb 6.1 oz (84.088 kg)  12/16/07 176 lb 8 oz (80.06 kg)  08/19/07 168 lb 1 oz (76.233 kg)    Usual Body Weight: 182 lb  % Usual Body Weight: 99%  BMI:  Body mass index is 32.15 kg/(m^2).  Estimated Nutritional Needs: Kcal: 1800-2000 Protein: 90-100 gm Fluid: 1.8-2.0 L  Skin:  Intact  Diet Order: NPO  EDUCATION NEEDS: -No education needs identified at this time   Intake/Output Summary (Last 24 hours) at 09/20/13 0939 Last data filed at 09/20/13 0700  Gross per 24 hour  Intake    200 ml  Output   1600 ml  Net  -1400 ml    Labs:   Recent Labs Lab 09/18/13 2244 09/19/13 1100 09/20/13 0500  NA 145 143 141  K 3.5* 3.8 3.7  CL 103 107 102  CO2  --  24 21  BUN 16 14 11   CREATININE 1.00 0.94 0.92  CALCIUM  --  8.8 8.5  MG  --  2.0 1.8  PHOS  --   --  2.7  GLUCOSE 95 82 74    CBG (last 3)   Recent Labs  09/20/13 0821  GLUCAP 76    Scheduled Meds: . Chlorhexidine Gluconate Cloth  6 each Topical Q0600  . heparin  5,000 Units Subcutaneous 3 times per day  . insulin aspart  0-9 Units Subcutaneous 6 times per day  . mupirocin ointment  1 application Nasal BID  . pantoprazole (PROTONIX) IV  40 mg Intravenous Q12H  . piperacillin-tazobactam (ZOSYN)  IV  3.375 g Intravenous Q8H  . sodium chloride  3 mL Intravenous Q12H  . sodium chloride  3 mL Intravenous Q12H  . vancomycin  1,000 mg Intravenous Q12H  Continuous Infusions: . sodium chloride 100 mL/hr (09/18/13 2352)  . sodium chloride    . Marland KitchenTPN (CLINIMIX-E) Adult     And  . fat emulsion      Past Medical History  Diagnosis Date  . Hypertension   . Hyperlipidemia   . MS (multiple sclerosis)     2007  . Back pain     Past Surgical History  Procedure Laterality Date  . Abdominal hysterectomy  2005    fibroids  . Esophagogastroduodenoscopy N/A 09/18/2013    Procedure: ESOPHAGOGASTRODUODENOSCOPY (EGD);  Surgeon: Jerene Bears, MD;  Location: [facility];  Service: Gastroenterology;  Laterality: N/A;    Arthur Holms, RD, LDN Pager #: [phone] After-Hours Pager #: [phone]

## 2013-09-20 NOTE — Progress Notes (Signed)
PARENTERAL NUTRITION CONSULT NOTE - INITIAL  Pharmacy Consult for TPN Indication: intolerance to enteral feeds  No Known Allergies  Patient Measurements: Height: 5\' 3"  (160 cm) Weight: 181 lb 7 oz (82.3 kg) IBW/kg (Calculated) : 52.4 Usual Weight:   Vital Signs: Temp: 98.6 F (37 C) (05/27 0400) Temp src: Oral (05/27 0400) BP: 103/53 mmHg (05/27 0400) Pulse Rate: 69 (05/27 0400) Intake/Output from previous day: 05/26 0701 - 05/27 0700 In: 200 [IV Piggyback:200] Out: 1600 [Urine:1600] Intake/Output from this shift:    Labs:  Recent Labs  09/18/13 2240 09/18/13 2244 09/19/13 1100 09/20/13 0500  WBC 11.0*  --   --  7.6  HGB 12.2 12.2  --  10.9*  HCT 36.5 36.0  --  33.0*  PLT 320  --   --  249  APTT  --   --  29  --   INR  --   --  1.10  --      Recent Labs  09/18/13 2244 09/19/13 1100 09/20/13 0500  NA 145 143 141  K 3.5* 3.8 3.7  CL 103 107 102  CO2  --  24 21  GLUCOSE 95 82 74  BUN 16 14 11   CREATININE 1.00 0.94 0.92  CALCIUM  --  8.8 8.5  MG  --  2.0 1.8  PHOS  --   --  2.7  PROT  --  6.6 6.4  ALBUMIN  --  3.4* 3.3*  AST  --  17 20  ALT  --  12 12  ALKPHOS  --  54 52  BILITOT  --  0.7 0.7  TRIG  --   --  189*   Estimated Creatinine Clearance: 70.2 ml/min (by C-G formula based on Cr of 0.92).   No results found for this basename: GLUCAP,  in the last 72 hours  Medical History: Past Medical History  Diagnosis Date  . Hypertension   . Hyperlipidemia   . MS (multiple sclerosis)     2007  . Back pain     Medications:  Scheduled:  . Chlorhexidine Gluconate Cloth  6 each Topical Q0600  . heparin  5,000 Units Subcutaneous 3 times per day  . mupirocin ointment  1 application Nasal BID  . pantoprazole (PROTONIX) IV  40 mg Intravenous Q12H  . piperacillin-tazobactam (ZOSYN)  IV  3.375 g Intravenous Q8H  . sodium chloride  3 mL Intravenous Q12H  . sodium chloride  3 mL Intravenous Q12H  . vancomycin  1,000 mg Intravenous Q12H    Insulin  Requirements in the past 24 hours:  none   Lytes:  K 3.7, Na 141, Mag 1.8, Phos 2.7 Renal: SrCr 0.92 Cards: BP soft, HR 69 Hepatobil: baseline LFTs wnl, TG 189, alb 3.3 Neuro: h/o multiple sclerosis on no home meds  TPN Access: PICC 5/26 TPN day#:0  Current Nutrition:  NPO  Assessment: 53 yo lady who presented with food impaction >24 hours.  She had an EGD to remove food and was noted to have a mucosal tear.  Gastrograffin swallow did not show contrast leak.  Plan to manage her non-operatively. PICC line was placed for TPN.  Nutritional Goals:  Per RD's assessment  Plan:  Will start Clinimix at 40 ml/hr and lipids at 5 ml/hr Add sens scale ssi when TPN started Decrease IV fluids as TPN rate advanced F/u am labs   Keath Matera Poteet Keeley Sussman 09/20/2013,7:27 AM

## 2013-09-20 NOTE — Progress Notes (Signed)
2 Days Post-Op Procedure(s) (LRB): ESOPHAGOGASTRODUODENOSCOPY (EGD) (N/A) Subjective: C/o of pain in neck and chest as well as chronic back pain Has not taken any pain medication  Objective: Vital signs in last 24 hours: Temp:  [98.6 F (37 C)-100.5 F (38.1 C)] 98.6 F (37 C) (05/27 0400) Pulse Rate:  [59-84] 69 (05/27 0400) Cardiac Rhythm:  [-] Normal sinus rhythm (05/26 1932) Resp:  [14-26] 20 (05/27 0400) BP: (103-138)/(43-67) 103/53 mmHg (05/27 0400) SpO2:  [94 %-99 %] 94 % (05/27 0400) FiO2 (%):  [0 %] 0 % (05/26 1002) Weight:  [177 lb 8 oz (80.513 kg)-181 lb 7 oz (82.3 kg)] 181 lb 7 oz (82.3 kg) (05/27 0500)  Hemodynamic parameters for last 24 hours:    Intake/Output from previous day: 05/26 0701 - 05/27 0700 In: 200 [IV Piggyback:200] Out: 1600 [Urine:1600] Intake/Output this shift:    General appearance: alert and no distress Heart: regular rate and rhythm Lungs: clear to auscultation bilaterally subcutaneous emphysema in neck  Lab Results:  Recent Labs  09/18/13 2240 09/18/13 2244 09/20/13 0500  WBC 11.0*  --  7.6  HGB 12.2 12.2 10.9*  HCT 36.5 36.0 33.0*  PLT 320  --  249   BMET:  Recent Labs  09/19/13 1100 09/20/13 0500  NA 143 141  K 3.8 3.7  CL 107 102  CO2 24 21  GLUCOSE 82 74  BUN 14 11  CREATININE 0.94 0.92  CALCIUM 8.8 8.5    PT/INR:  Recent Labs  09/19/13 1100  LABPROT 14.0  INR 1.10   ABG    Component Value Date/Time   TCO2 25 09/18/2013 2244   CBG (last 3)   Recent Labs  09/20/13 0821  GLUCAP 76    Assessment/Plan: S/P Procedure(s) (LRB): ESOPHAGOGASTRODUODENOSCOPY (EGD) (N/A) - HD # 2 esophageal perforation in setting of food impaction Her swallow has not been officially read but showed no major leak She is afebrile on Vanco and zosyn Morphine PRN for pain Continue NPO, start TNA Would repeat swallow on Monday   LOS: 2 days    Pamela Walters 09/20/2013

## 2013-09-20 NOTE — Progress Notes (Signed)
[facility] Progress Note  Subjective:  PICC line in place and TNA will be started.  C/O neck and chest pain.  Repeat chest x-ray shows some improvement in pneumomediastinum.  Objective:  Vital signs in last 24 hours: Temp:  [98.5 F (36.9 C)-100.5 F (38.1 C)] 98.5 F (36.9 C) (05/27 0854) Pulse Rate:  [59-84] 81 (05/27 0854) Resp:  [14-26] 14 (05/27 0854) BP: (103-135)/(43-60) 127/60 mmHg (05/27 0854) SpO2:  [94 %-96 %] 96 % (05/27 0854) Weight:  [181 lb 7 oz (82.3 kg)] 181 lb 7 oz (82.3 kg) (05/27 0500) Last BM Date: 09/18/13 General:  Alert, Well-developed, in NAD Heart:  Regular rate and rhythm; no murmurs Pulm:  CTAB.  No W/R/R. Abdomen:  Soft, non-distended. Normal bowel sounds.  Non-tender. Extremities:  Without edema. Neurologic:  Alert and  oriented x4;  grossly normal neurologically. Psych:  Alert and cooperative. Normal mood and affect.  Intake/Output from previous day: 05/26 0701 - 05/27 0700 In: 200 [IV Piggyback:200] Out: 1600 [Urine:1600]  Lab Results:  Recent Labs  09/18/13 2240 09/18/13 2244 09/20/13 0500  WBC 11.0*  --  7.6  HGB 12.2 12.2 10.9*  HCT 36.5 36.0 33.0*  PLT 320  --  249   BMET  Recent Labs  09/18/13 2244 09/19/13 1100 09/20/13 0500  NA 145 143 141  K 3.5* 3.8 3.7  CL 103 107 102  CO2  --  24 21  GLUCOSE 95 82 74  BUN 16 14 11   CREATININE 1.00 0.94 0.92  CALCIUM  --  8.8 8.5   LFT  Recent Labs  09/20/13 0500  PROT 6.4  ALBUMIN 3.3*  AST 20  ALT 12  ALKPHOS 52  BILITOT 0.7   PT/INR  Recent Labs  09/19/13 1100  LABPROT 14.0  INR 1.10   X-ray Chest Pa And Lateral   09/20/2013   CLINICAL DATA:  Followup esophageal rupture.  EXAM: CHEST  2 VIEW  COMPARISON:  09/18/2013  FINDINGS: Pneumomediastinum and neck base air is less apparent on the current study than on the prior exam.  No pneumothorax.  Lungs are clear.  Minimal pleural effusions.  Cardiac silhouette is normal in size. Normal  mediastinal and hilar contours. Right PICC tip lies at the caval atrial junction.  IMPRESSION: Pneumomediastinum and neck base air appears less prominent. No acute findings in the lungs. No pneumothorax. The right PICC is well positioned.   Electronically Signed   By: Lajean Manes M.D.   On: 09/20/2013 08:03   Dg Chest 2 View  09/18/2013   CLINICAL DATA:  Status post procedure; mild mucosal tearing of the esophagus. Assess for pneumomediastinum.  EXAM: CHEST  2 VIEW  COMPARISON:  Chest radiograph performed earlier today at 2:56 p.m.  FINDINGS: Scattered soft tissue air is noted tracking about both sides of the neck, worse on the right, and to a lesser extent about the superior mediastinum. This raises suspicion for either a hypopharyngeal mucosal tear or possibly a tear at the upper esophagus. The remainder of the mediastinum is unremarkable in appearance. Prominent prevertebral soft tissue air is noted on the lateral view.  The lungs are clear bilaterally. No focal consolidation, pleural effusion or pneumothorax is seen.  No acute osseous abnormalities are identified.  IMPRESSION: Scattered soft tissue air tracking about both sides of the neck, worse on the right, and to a lesser extent about the superior mediastinum. Prominent prevertebral soft tissue air noted on the lateral view. This raises suspicion  for either a hypopharyngeal mucosal tear, more likely on the right, or possibly a tear at the upper esophagus.  These results were called by telephone at the time of interpretation on 09/18/2013 at 9:47 PM to Dr. Davonna Belling, who verbally acknowledged these results.   Electronically Signed   By: Garald Balding M.D.   On: 09/18/2013 21:48   Dg Chest 2 View  09/18/2013   CLINICAL DATA:  Right-sided chest pain.  EXAM: CHEST  2 VIEW  COMPARISON:  None.  FINDINGS: Cardiac silhouette is normal in size. The aorta is mildly uncoiled. No mediastinal or hilar masses. No evidence of adenopathy.  Lungs are clear.   No pleural effusion.  No pneumothorax.  Bony thorax is demineralized but intact.  No evidence of an opaque esophageal foreign body.  IMPRESSION: No active cardiopulmonary disease.   Electronically Signed   By: Lajean Manes M.D.   On: 09/18/2013 15:10   Ct Soft Tissue Neck W Contrast  09/19/2013   CLINICAL DATA:  Recent food impaction with perforation  EXAM: CT NECK WITH CONTRAST, CT the chest with contrast  TECHNIQUE: Multidetector CT imaging of the neck and chest was performed using the standard protocol following the bolus administration of intravenous contrast. Additionally a small amount of oral contrast was administered.  CONTRAST:  66mL OMNIPAQUE IOHEXOL 300 MG/ML  SOLN  COMPARISON:  None.  FINDINGS: CT of the neck:  Skull base has contents are within normal limits. The parotid and submandibular glands are unremarkable. There is considerable amount of retropharyngeal a year identified extending throughout the neck from the recent esophageal perforation. A definitive sided perforation is not well appreciated on this exam. No significant lymphadenopathy is noted. The osseous structures are within normal limits with only mild facet hypertrophic changes.  CT of the chest:  The lungs are well aerated bilaterally without evidence of focal infiltrate or sizable effusion. A minimal amount of dependent atelectasis is noted bilaterally. The thoracic aorta and pulmonary artery as visualized are within normal limits. The origins of great vessels are unremarkable.  There is a considerable amount of mediastinal air identified related to the recent esophageal perforation. It extends superiorly into the neck and inferiorly into the upper abdomen surrounding the gastroesophageal junction. No large full thickness tear of the esophagus is identified. A small amount contrast was administered and is noted in the distal esophagus. Middle most soft tissue density is noted within the contrast column which may represent a focal  mucosal tear. No extravasation of contrast material into the mediastinum is identified. The visualized upper abdomen is within normal limits. The bony structures demonstrate degenerative change of thoracic spine.  IMPRESSION: CT of the neck: Considerable retropharyngeal air consistent with the recent esophageal perforation. No focal area to suggest a site of perforation is identified.  CT of the chest: Considerable mediastinal air consistent with recent esophageal perforation. There is an area of mucosal irregularity noted just above the gastroesophageal junction which may represent a partial thickness mucosal tear. No frank extravasation of contrast material into the mediastinum is noted.   Electronically Signed   By: Inez Catalina M.D.   On: 09/19/2013 07:15   Ct Chest W Contrast  09/19/2013   CLINICAL DATA:  Recent food impaction with perforation  EXAM: CT NECK WITH CONTRAST, CT the chest with contrast  TECHNIQUE: Multidetector CT imaging of the neck and chest was performed using the standard protocol following the bolus administration of intravenous contrast. Additionally a small amount of oral contrast  was administered.  CONTRAST:  1mL OMNIPAQUE IOHEXOL 300 MG/ML  SOLN  COMPARISON:  None.  FINDINGS: CT of the neck:  Skull base has contents are within normal limits. The parotid and submandibular glands are unremarkable. There is considerable amount of retropharyngeal a year identified extending throughout the neck from the recent esophageal perforation. A definitive sided perforation is not well appreciated on this exam. No significant lymphadenopathy is noted. The osseous structures are within normal limits with only mild facet hypertrophic changes.  CT of the chest:  The lungs are well aerated bilaterally without evidence of focal infiltrate or sizable effusion. A minimal amount of dependent atelectasis is noted bilaterally. The thoracic aorta and pulmonary artery as visualized are within normal limits. The  origins of great vessels are unremarkable.  There is a considerable amount of mediastinal air identified related to the recent esophageal perforation. It extends superiorly into the neck and inferiorly into the upper abdomen surrounding the gastroesophageal junction. No large full thickness tear of the esophagus is identified. A small amount contrast was administered and is noted in the distal esophagus. Middle most soft tissue density is noted within the contrast column which may represent a focal mucosal tear. No extravasation of contrast material into the mediastinum is identified. The visualized upper abdomen is within normal limits. The bony structures demonstrate degenerative change of thoracic spine.  IMPRESSION: CT of the neck: Considerable retropharyngeal air consistent with the recent esophageal perforation. No focal area to suggest a site of perforation is identified.  CT of the chest: Considerable mediastinal air consistent with recent esophageal perforation. There is an area of mucosal irregularity noted just above the gastroesophageal junction which may represent a partial thickness mucosal tear. No frank extravasation of contrast material into the mediastinum is noted.   Electronically Signed   By: Inez Catalina M.D.   On: 09/19/2013 07:15   Dg Esophagus W/water Sol Cm  09/19/2013   CLINICAL DATA:  History of food impaction in distal esophagus. Upper endoscopy revealed a mucosal tear in the distal esophagus, just proximal to a Schatzki's ring.  EXAM: ESOPHOGRAM/BARIUM SWALLOW  TECHNIQUE: Single contrast examination was performed using water-soluble contrast material followed by thin barium.  FLUOROSCOPY TIME:  2 min and 25 seconds.  COMPARISON:  Chest CT from 09/19/2013.  FINDINGS: Pneumomediastinum is observed fluoroscopically.  The patient was initially given sips of water soluble contrast material mall mean monitored fluoroscopically. There was no evidence for contrast extravasation from the  esophagus.  Subsequently, thin barium was given by mouth. This was performed in both upright and oblique supine positions. No extravasation of contrast from the distal esophagus, in the region of the endoscopically noted mucosal tear, was observed. There was some subtle mucosal irregularity in the distal esophagus just proximal to a Schatzki's ring that likely represents the area of mucosal injury, but no leak of barium at this location was observed.  As noted endoscopically, the patient does have a Schatzki's ring. There is a small associated hiatal hernia.  IMPRESSION: Small area of mucosal irregularity in the distal esophagus, just proximal to a Schatzki ring. This is compatible with the area of mucosal abnormality identified endoscopically. No leak or extravasation of contrast from the distal esophagus is observed despite repeated swallows water-soluble and thin barium contrast material.  Pneumomediastinum.   Electronically Signed   By: Misty Stanley M.D.   On: 09/19/2013 08:57    Assessment / Plan: -53 yo woman who presented with a food impaction of > 24  hours. She had EGD to remove food impaction on 5/25. She was noted to have a mucosal tear. A CT showed air in the mediastinum and soft tissues of the neck consistent with a perforation. Gastrograffin study did not show any leak or extravasation of contrast. CTS following and are planning to manage non-operatively. She is NPO with IVF's and IV antibiotics (Vanco and Zosyn).  Had PICC line placed and TNA will be started as well.  *Will sign off from GI standpoint.   LOS: 2 days   Princella Pellegrini. Zehr  09/20/2013, 10:31 AM  Pager number [phone]   GI ATTENDING  Interval history and laboratories reviewed. Patient personally seen and examined. Husband in room. Less discomfort today. No new problems. Afebrile with normal white blood cell count. Abdomen benign. Plan is moving forward as outlined by thoracic surgery. No further plans from GI perspective at  this time, but will need outpatient followup after this has completely resolved. She would benefit from control dilation to prevent or reduce risk of future food impaction. Discussed with patient and husband. We're available as needed. Thank you  Wilhemina Bonito. Eda Keys., M.D. [facility] Division of Gastroenterology

## 2013-09-20 NOTE — H&P (Signed)
INTERNAL MEDICINE TEACHING ATTENDING NOTE  Day 2 of stay  Patient name: Pamela Walters  MRN: [id number] Date of birth: 1960-11-28   53 y.o. old lady with esophageal food impaction, found to have a mucosal tear upon dislodgement of the impacted food piece. She was then admitted with neck and chest pain and found to have pneumomediastinum with no extravasation noted in distal esophagus in a subsequent gastrograffin study.  Today, the patient was doing well, sitting in bed comfortably. She reports some right sided neck pain. She denies nausea, vomiting, chest pain, palpitations or shortness of breath.  Filed Vitals:   09/20/13 0900 09/20/13 1000 09/20/13 1200 09/20/13 1247  BP: 120/57 121/65 130/59 141/70  Pulse: 67 67 80 73  Temp:    98.4 F (36.9 C)  TempSrc:    Oral  Resp: 20 21 25 24   Height:      Weight:      SpO2: 95% 96% 95% 95%    Physical Exam   General: Resting in bed. HEENT: PERRL, EOMI, no scleral icterus. Heart: RRR, no rubs, murmurs or gallops. Lungs: Clear to auscultation bilaterally, no wheezes, rales, or rhonchi. Abdomen: Soft, nontender, nondistended, BS present. Extremities: Warm, no pedal edema. Neuro: Alert and oriented X3, no gross abnormality.   Labs and imaging reviewed.    Assessment and Plan     Pneumomediastinum - The current chest Xray is reported to be better than the previous one. The patient feels lesser pain. She has no symptoms of shortness of breath or chest pressure currently.   Mucosal tear esophagus - Medical management since the leak is contained in the mediastinum and neck and the patient has minimal symptoms. However, the [facility] team is on board on the case with [place] if need be. Patient is NPO on normal saline. She has been placed on IV broad spectrum antibiotic coverage. The patient will avoid oral intake for at least 7 days. She has a PICC line and will use TPA. She will have broad spectrum antibiotics intravenously for 7-14 days.   Fall  in hemoglobin - repeat value.  Disposition - The patient has remained stable through out her stay in the hospital and can be transferred out of step down if [facility] is okay with it. If she continues to be stable, and the pathology does not progress, we will touch base with [facility] about discharge planning.     I have seen and evaluated this patient and discussed it with my IM resident team.  Please see the rest of the plan per resident note from today.   Tjay Velazquez 09/20/2013, 12:38 PM.

## 2013-09-20 NOTE — Progress Notes (Signed)
Subjective: Patient was feeling better, states that her pain has decreased to a mild discomfort. Still waiting for her TPN to get started, getting IV fluids.Denies ant chest pain, SOB, palpitation, N/V. Objective: Vital signs in last 24 hours: Filed Vitals:   09/20/13 0900 09/20/13 1000 09/20/13 1200 09/20/13 1247  BP: 120/57 121/65 130/59 141/70  Pulse: 67 67 80 73  Temp:    98.4 F (36.9 C)  TempSrc:    Oral  Resp: 20 21 25 24   Height:      Weight:      SpO2: 95% 96% 95% 95%   Weight change:   Intake/Output Summary (Last 24 hours) at 09/20/13 1402 Last data filed at 09/20/13 1248  Gross per 24 hour  Intake   1050 ml  Output   1900 ml  Net   -850 ml   EXAM:  Patient was well oriented, and comfortable. HEENT: AT/[place], PERRL.EOMI. NECK: Mildly tender right neck, no adenopathy. No thyromegaly LUNGS: clear B/L, no added sound HEART: S! And S2 , no MRG ABD: mildly tender epigastrium, no rebound or guarding. BS + . No HSM EXT: no edema, no cyanosis. PP 2+ B/L NEURO: A & O X 3, CN 2-12 grossly intact. Strength and sensation normal B/L. Lab Results: @LABTEST2 @ Micro Results: Recent Results (from the past 240 hour(s))  MRSA PCR SCREENING     Status: Abnormal   Collection Time    09/19/13 10:07 AM      Result Value Ref Range Status   MRSA by PCR POSITIVE (*) NEGATIVE Final   Comment:            The GeneXpert MRSA Assay (FDA     approved for NASAL specimens     only), is one component of a     comprehensive MRSA colonization     surveillance program. It is not     intended to diagnose MRSA     infection nor to guide or     monitor treatment for     MRSA infections.     RESULT CALLED TO, READ BACK BY AND VERIFIED WITH:     WHITE RN 11:50 09/19/13 ([email or web address])  CULTURE, BLOOD (ROUTINE X 2)     Status: None   Collection Time    09/19/13 11:00 AM      Result Value Ref Range Status   Specimen Description BLOOD LEFT ANTECUBITAL   Final   Special Requests BOTTLES DRAWN AEROBIC  ONLY 3CC   Final   Culture  Setup Time     Final   Value: 09/19/2013 16:25     Performed at [facility]   Culture     Final   Value:        BLOOD CULTURE RECEIVED NO GROWTH TO DATE CULTURE WILL BE HELD FOR 5 DAYS BEFORE ISSUING A FINAL NEGATIVE REPORT     Performed at [facility]   Report Status PENDING   Incomplete  CULTURE, BLOOD (ROUTINE X 2)     Status: None   Collection Time    09/19/13 11:14 AM      Result Value Ref Range Status   Specimen Description BLOOD LEFT HAND   Final   Special Requests BOTTLES DRAWN AEROBIC ONLY 5CC   Final   Culture  Setup Time     Final   Value: 09/19/2013 16:25     Performed at [facility]   Culture     Final   Value:  BLOOD CULTURE RECEIVED NO GROWTH TO DATE CULTURE WILL BE HELD FOR 5 DAYS BEFORE ISSUING A FINAL NEGATIVE REPORT     Performed at [facility]   Report Status PENDING   Incomplete   Studies/Results: X-ray Chest Pa And Lateral   09/20/2013   CLINICAL DATA:  Followup esophageal rupture.  EXAM: CHEST  2 VIEW  COMPARISON:  09/18/2013  FINDINGS: Pneumomediastinum and neck base air is less apparent on the current study than on the prior exam.  No pneumothorax.  Lungs are clear.  Minimal pleural effusions.  Cardiac silhouette is normal in size. Normal mediastinal and hilar contours. Right PICC tip lies at the caval atrial junction.  IMPRESSION: Pneumomediastinum and neck base air appears less prominent. No acute findings in the lungs. No pneumothorax. The right PICC is well positioned.   Electronically Signed   By: Lajean Manes M.D.   On: 09/20/2013 08:03   Dg Chest 2 View  09/18/2013   CLINICAL DATA:  Status post procedure; mild mucosal tearing of the esophagus. Assess for pneumomediastinum.  EXAM: CHEST  2 VIEW  COMPARISON:  Chest radiograph performed earlier today at 2:56 p.m.  FINDINGS: Scattered soft tissue air is noted tracking about both sides of the neck, worse on the right, and to a lesser extent  about the superior mediastinum. This raises suspicion for either a hypopharyngeal mucosal tear or possibly a tear at the upper esophagus. The remainder of the mediastinum is unremarkable in appearance. Prominent prevertebral soft tissue air is noted on the lateral view.  The lungs are clear bilaterally. No focal consolidation, pleural effusion or pneumothorax is seen.  No acute osseous abnormalities are identified.  IMPRESSION: Scattered soft tissue air tracking about both sides of the neck, worse on the right, and to a lesser extent about the superior mediastinum. Prominent prevertebral soft tissue air noted on the lateral view. This raises suspicion for either a hypopharyngeal mucosal tear, more likely on the right, or possibly a tear at the upper esophagus.  These results were called by telephone at the time of interpretation on 09/18/2013 at 9:47 PM to Dr. Davonna Belling, who verbally acknowledged these results.   Electronically Signed   By: Garald Balding M.D.   On: 09/18/2013 21:48   Dg Chest 2 View  09/18/2013   CLINICAL DATA:  Right-sided chest pain.  EXAM: CHEST  2 VIEW  COMPARISON:  None.  FINDINGS: Cardiac silhouette is normal in size. The aorta is mildly uncoiled. No mediastinal or hilar masses. No evidence of adenopathy.  Lungs are clear.  No pleural effusion.  No pneumothorax.  Bony thorax is demineralized but intact.  No evidence of an opaque esophageal foreign body.  IMPRESSION: No active cardiopulmonary disease.   Electronically Signed   By: Lajean Manes M.D.   On: 09/18/2013 15:10   Ct Soft Tissue Neck W Contrast  09/19/2013   CLINICAL DATA:  Recent food impaction with perforation  EXAM: CT NECK WITH CONTRAST, CT the chest with contrast  TECHNIQUE: Multidetector CT imaging of the neck and chest was performed using the standard protocol following the bolus administration of intravenous contrast. Additionally a small amount of oral contrast was administered.  CONTRAST:  54mL OMNIPAQUE IOHEXOL  300 MG/ML  SOLN  COMPARISON:  None.  FINDINGS: CT of the neck:  Skull base has contents are within normal limits. The parotid and submandibular glands are unremarkable. There is considerable amount of retropharyngeal a year identified extending throughout the neck from the recent  esophageal perforation. A definitive sided perforation is not well appreciated on this exam. No significant lymphadenopathy is noted. The osseous structures are within normal limits with only mild facet hypertrophic changes.  CT of the chest:  The lungs are well aerated bilaterally without evidence of focal infiltrate or sizable effusion. A minimal amount of dependent atelectasis is noted bilaterally. The thoracic aorta and pulmonary artery as visualized are within normal limits. The origins of great vessels are unremarkable.  There is a considerable amount of mediastinal air identified related to the recent esophageal perforation. It extends superiorly into the neck and inferiorly into the upper abdomen surrounding the gastroesophageal junction. No large full thickness tear of the esophagus is identified. A small amount contrast was administered and is noted in the distal esophagus. Middle most soft tissue density is noted within the contrast column which may represent a focal mucosal tear. No extravasation of contrast material into the mediastinum is identified. The visualized upper abdomen is within normal limits. The bony structures demonstrate degenerative change of thoracic spine.  IMPRESSION: CT of the neck: Considerable retropharyngeal air consistent with the recent esophageal perforation. No focal area to suggest a site of perforation is identified.  CT of the chest: Considerable mediastinal air consistent with recent esophageal perforation. There is an area of mucosal irregularity noted just above the gastroesophageal junction which may represent a partial thickness mucosal tear. No frank extravasation of contrast material into  the mediastinum is noted.   Electronically Signed   By: Inez Catalina M.D.   On: 09/19/2013 07:15   Ct Chest W Contrast  09/19/2013   CLINICAL DATA:  Recent food impaction with perforation  EXAM: CT NECK WITH CONTRAST, CT the chest with contrast  TECHNIQUE: Multidetector CT imaging of the neck and chest was performed using the standard protocol following the bolus administration of intravenous contrast. Additionally a small amount of oral contrast was administered.  CONTRAST:  16mL OMNIPAQUE IOHEXOL 300 MG/ML  SOLN  COMPARISON:  None.  FINDINGS: CT of the neck:  Skull base has contents are within normal limits. The parotid and submandibular glands are unremarkable. There is considerable amount of retropharyngeal a year identified extending throughout the neck from the recent esophageal perforation. A definitive sided perforation is not well appreciated on this exam. No significant lymphadenopathy is noted. The osseous structures are within normal limits with only mild facet hypertrophic changes.  CT of the chest:  The lungs are well aerated bilaterally without evidence of focal infiltrate or sizable effusion. A minimal amount of dependent atelectasis is noted bilaterally. The thoracic aorta and pulmonary artery as visualized are within normal limits. The origins of great vessels are unremarkable.  There is a considerable amount of mediastinal air identified related to the recent esophageal perforation. It extends superiorly into the neck and inferiorly into the upper abdomen surrounding the gastroesophageal junction. No large full thickness tear of the esophagus is identified. A small amount contrast was administered and is noted in the distal esophagus. Middle most soft tissue density is noted within the contrast column which may represent a focal mucosal tear. No extravasation of contrast material into the mediastinum is identified. The visualized upper abdomen is within normal limits. The bony structures  demonstrate degenerative change of thoracic spine.  IMPRESSION: CT of the neck: Considerable retropharyngeal air consistent with the recent esophageal perforation. No focal area to suggest a site of perforation is identified.  CT of the chest: Considerable mediastinal air consistent with recent esophageal perforation. There  is an area of mucosal irregularity noted just above the gastroesophageal junction which may represent a partial thickness mucosal tear. No frank extravasation of contrast material into the mediastinum is noted.   Electronically Signed   By: Inez Catalina M.D.   On: 09/19/2013 07:15   Dg Esophagus W/water Sol Cm  09/19/2013   CLINICAL DATA:  History of food impaction in distal esophagus. Upper endoscopy revealed a mucosal tear in the distal esophagus, just proximal to a Schatzki's ring.  EXAM: ESOPHOGRAM/BARIUM SWALLOW  TECHNIQUE: Single contrast examination was performed using water-soluble contrast material followed by thin barium.  FLUOROSCOPY TIME:  2 min and 25 seconds.  COMPARISON:  Chest CT from 09/19/2013.  FINDINGS: Pneumomediastinum is observed fluoroscopically.  The patient was initially given sips of water soluble contrast material mall mean monitored fluoroscopically. There was no evidence for contrast extravasation from the esophagus.  Subsequently, thin barium was given by mouth. This was performed in both upright and oblique supine positions. No extravasation of contrast from the distal esophagus, in the region of the endoscopically noted mucosal tear, was observed. There was some subtle mucosal irregularity in the distal esophagus just proximal to a Schatzki's ring that likely represents the area of mucosal injury, but no leak of barium at this location was observed.  As noted endoscopically, the patient does have a Schatzki's ring. There is a small associated hiatal hernia.  IMPRESSION: Small area of mucosal irregularity in the distal esophagus, just proximal to a Schatzki  ring. This is compatible with the area of mucosal abnormality identified endoscopically. No leak or extravasation of contrast from the distal esophagus is observed despite repeated swallows water-soluble and thin barium contrast material.  Pneumomediastinum.   Electronically Signed   By: Misty Stanley M.D.   On: 09/19/2013 08:57   Medications: medication reviewed Scheduled Meds: . Chlorhexidine Gluconate Cloth  6 each Topical Q0600  . heparin  5,000 Units Subcutaneous 3 times per day  . insulin aspart  0-9 Units Subcutaneous 6 times per day  . mupirocin ointment  1 application Nasal BID  . pantoprazole (PROTONIX) IV  40 mg Intravenous Q12H  . piperacillin-tazobactam (ZOSYN)  IV  3.375 g Intravenous Q8H  . sodium chloride  3 mL Intravenous Q12H  . sodium chloride  3 mL Intravenous Q12H  . vancomycin  1,000 mg Intravenous Q12H   Continuous Infusions: . sodium chloride 100 mL/hr at 09/20/13 1233  . sodium chloride    . Marland KitchenTPN (CLINIMIX-E) Adult     And  . fat emulsion     PRN Meds:.sodium chloride, LORazepam, morphine injection, ondansetron (ZOFRAN) IV, sodium chloride, sodium chloride Assessment/Plan: Principal Problem:   Esophageal obstruction due to food impaction Active Problems:   HYPERLIPIDEMIA   Obesity, unspecified   DEPRESSION   MULTIPLE SCLEROSIS, PROGRESSIVE/RELAPSING   HYPERTENSION   Chest pain   Esophageal rupture ESOPHAGEAL PERFORATION:She had EGD to remove food impaction on 5/25. She was noted to have a mucosal tear. A CT showed air in the mediastinum and soft tissues of the neck consistent with a perforation. Gastrograffin study did not show any leak or extravasation of contrast. CTS following and are planning to manage non-operatively. She is NPO with IVF's and IV antibiotics (Vanco and Zosyn). Had PICC line placed and TNA will be started today. Her follow up CXR doesn't show any expansion of pneumomediastinum and clinically patient was stable , continue with conservative  management as advised by CT. GI discharged her with the recommendation about GI follow  up and that she might get benefit from dilation later on to prevent any future incidences. HTN; Her BP is stable currently with out any meds. Monitor and if it start going up, can add some IV meds. As she is NPO. HYPERLIPIDEMIA; Hold all her home meds. Currently, can resume after she resumes PO. MS: looks non progressive, not on any treatment. Can be managed if any symptoms arises.   This is a Careers information officer Note.  The care of the patient was discussed with Dr. Gordy Levan and the assessment and plan formulated with their assistance.  Please see their attached note for official documentation of the daily encounter.   LOS: 2 days   Lorella Nimrod, Med Student 09/20/2013, 2:02 PM

## 2013-09-21 DIAGNOSIS — E876 Hypokalemia: Secondary | ICD-10-CM | POA: Diagnosis present

## 2013-09-21 LAB — PHOSPHORUS: PHOSPHORUS: 2.5 mg/dL (ref 2.3–4.6)

## 2013-09-21 LAB — TRIGLYCERIDES: TRIGLYCERIDES: 216 mg/dL — AB (ref <150)

## 2013-09-21 LAB — COMPREHENSIVE METABOLIC PANEL
ALBUMIN: 3.2 g/dL — AB (ref 3.5–5.2)
ALK PHOS: 47 U/L (ref 39–117)
ALT: 10 U/L (ref 0–35)
AST: 18 U/L (ref 0–37)
BILIRUBIN TOTAL: 0.4 mg/dL (ref 0.3–1.2)
BUN: 9 mg/dL (ref 6–23)
CHLORIDE: 102 meq/L (ref 96–112)
CO2: 24 mEq/L (ref 19–32)
CREATININE: 0.79 mg/dL (ref 0.50–1.10)
Calcium: 8.6 mg/dL (ref 8.4–10.5)
GLUCOSE: 101 mg/dL — AB (ref 70–99)
POTASSIUM: 3.1 meq/L — AB (ref 3.7–5.3)
Sodium: 140 mEq/L (ref 137–147)
Total Protein: 6.5 g/dL (ref 6.0–8.3)

## 2013-09-21 LAB — CBC
HCT: 31.9 % — ABNORMAL LOW (ref 36.0–46.0)
Hemoglobin: 10.8 g/dL — ABNORMAL LOW (ref 12.0–15.0)
MCH: 27.1 pg (ref 26.0–34.0)
MCHC: 33.9 g/dL (ref 30.0–36.0)
MCV: 79.9 fL (ref 78.0–100.0)
PLATELETS: 251 10*3/uL (ref 150–400)
RBC: 3.99 MIL/uL (ref 3.87–5.11)
RDW: 15.7 % — AB (ref 11.5–15.5)
WBC: 5.8 10*3/uL (ref 4.0–10.5)

## 2013-09-21 LAB — GLUCOSE, CAPILLARY
Glucose-Capillary: 110 mg/dL — ABNORMAL HIGH (ref 70–99)
Glucose-Capillary: 121 mg/dL — ABNORMAL HIGH (ref 70–99)
Glucose-Capillary: 132 mg/dL — ABNORMAL HIGH (ref 70–99)
Glucose-Capillary: 93 mg/dL (ref 70–99)

## 2013-09-21 LAB — MAGNESIUM: Magnesium: 2 mg/dL (ref 1.5–2.5)

## 2013-09-21 MED ORDER — INSULIN ASPART 100 UNIT/ML ~~LOC~~ SOLN: 0.0000 [IU] | Freq: Every day | SUBCUTANEOUS | Status: DC

## 2013-09-21 MED ORDER — POTASSIUM CHLORIDE 10 MEQ/50ML IV SOLN
10.0000 meq | INTRAVENOUS | Status: AC
  Administered 2013-09-21 (×4): 10 meq via INTRAVENOUS
  Filled 2013-09-21 (×4): qty 50

## 2013-09-21 MED ORDER — SODIUM CHLORIDE 0.9 % IV SOLN
INTRAVENOUS | Status: DC
  Administered 2013-09-21 – 2013-09-22 (×2): via INTRAVENOUS

## 2013-09-21 MED ORDER — FAT EMULSION 20 % IV EMUL
240.0000 mL | INTRAVENOUS | Status: AC
  Administered 2013-09-21: 240 mL via INTRAVENOUS
  Filled 2013-09-21: qty 250

## 2013-09-21 MED ORDER — TRACE MINERALS CR-CU-F-FE-I-MN-MO-SE-ZN IV SOLN
INTRAVENOUS | Status: AC
  Administered 2013-09-21: 18:00:00 via INTRAVENOUS
  Filled 2013-09-21: qty 2000

## 2013-09-21 MED ORDER — INSULIN ASPART 100 UNIT/ML ~~LOC~~ SOLN: 0.0000 [IU] | SUBCUTANEOUS | Status: DC

## 2013-09-21 NOTE — Progress Notes (Signed)
PARENTERAL NUTRITION CONSULT NOTE - FOLLOW UP  Pharmacy Consult for TPN Indication: Esophageal perforation  No Known Allergies  Patient Measurements: Height: 5\' 3"  (160 cm) Weight: 177 lb 8 oz (80.513 kg) (a) IBW/kg (Calculated) : 52.4 Adjusted Body Weight:  Usual Weight:   Vital Signs: Temp: 97.5 F (36.4 C) (05/28 0627) Temp src: Oral (05/28 0627) BP: 119/58 mmHg (05/28 0627) Pulse Rate: 84 (05/28 0627) Intake/Output from previous day: 05/27 0701 - 05/28 0700 In: 1550 [I.V.:1160; IV Piggyback:300; TPN:90] Out: 650 [Urine:650] Intake/Output from this shift:    Labs:  Recent Labs  09/18/13 2240 09/18/13 2244 09/19/13 1100 09/20/13 0500 09/21/13 0255  WBC 11.0*  --   --  7.6 5.8  HGB 12.2 12.2  --  10.9* 10.8*  HCT 36.5 36.0  --  33.0* 31.9*  PLT 320  --   --  249 251  APTT  --   --  29  --   --   INR  --   --  1.10  --   --      Recent Labs  09/19/13 1100 09/20/13 0500 09/21/13 0255  NA 143 141 140  K 3.8 3.7 3.1*  CL 107 102 102  CO2 24 21 24   GLUCOSE 82 74 101*  BUN 14 11 9   CREATININE 0.94 0.92 0.79  CALCIUM 8.8 8.5 8.6  MG 2.0 1.8 2.0  PHOS  --  2.7 2.5  PROT 6.6 6.4 6.5  ALBUMIN 3.4* 3.3* 3.2*  AST 17 20 18   ALT 12 12 10   ALKPHOS 54 52 47  BILITOT 0.7 0.7 0.4  PREALBUMIN  --  17.4*  --   TRIG  --  189* 216*   Estimated Creatinine Clearance: 79.8 ml/min (by C-G formula based on Cr of 0.79).    Recent Labs  09/20/13 2102 09/21/13 0024 09/21/13 0810  GLUCAP 95 132* 121*    Medications:  Scheduled:  . Chlorhexidine Gluconate Cloth  6 each Topical Q0600  . heparin  5,000 Units Subcutaneous 3 times per day  . insulin aspart  0-9 Units Subcutaneous 6 times per day  . mupirocin ointment  1 application Nasal BID  . pantoprazole (PROTONIX) IV  40 mg Intravenous Q12H  . piperacillin-tazobactam (ZOSYN)  IV  3.375 g Intravenous Q8H  . sodium chloride  3 mL Intravenous Q12H  . vancomycin  1,000 mg Intravenous Q12H    Insulin  Requirements in the past 24 hours:  0 units Sensitive SSI   Current Nutrition:  NPO   Nutritional Goals:  1800-2000 kcal and 90-100g pro per RD assessment.  Goal rate of 54ml/hr will meet 100% of these needs.  Assessment:  53 yo lady who presented with food impaction >24 hours. She had an EGD to remove food and was noted to have a mucosal tear. Gastrograffin swallow did not show contrast leak. Plan to manage her non-operatively. PICC line was placed for TPN.   GI:  Esophageal perforation  Endo:  No hx DM.  CBGs 95-132 since TPN initiated  Lytes:  K 3.1, Mg 2, Phos 2.5   Renal:   Cr < 1, UOP 0.47ml/kg/hr  Pulm:  RA  Cards:  BP soft, HR wnl  Hepatobil: baseline LFTs wnl, TG 189, alb 3.3.  Baseline PALB 17.4  Neuro:  h/o multiple sclerosis on no home meds   ID:  Vanc/Zosyn (5/26 >> ) for esophageal perforation.  AFeb, WBC wnl  Best Practices: Heparin SQ TPN Access:  PICC 5/26 TPN day#:  5/28 >>  Plan:  -Advance Clinimix-E 5/15 to 43ml/hr and increase Lipids to 37ml/hr -Sensitive SSI q4 -Decrease IVF to 76ml/hr -K runs x 4 -BMet in AM  Gracy Bruins, PharmD [facility]

## 2013-09-21 NOTE — Progress Notes (Signed)
3 Days Post-Op Procedure(s) (LRB): ESOPHAGOGASTRODUODENOSCOPY (EGD) (N/A) Subjective: Feels better today Denies pain  Objective: Vital signs in last 24 hours: Temp:  [97.5 F (36.4 C)-100.2 F (37.9 C)] 97.7 F (36.5 C) (05/28 1438) Pulse Rate:  [71-84] 74 (05/28 1438) Cardiac Rhythm:  [-] Normal sinus rhythm (05/27 2200) Resp:  [16-21] 16 (05/28 1438) BP: (119-154)/(58-87) 141/87 mmHg (05/28 1438) SpO2:  [95 %-98 %] 98 % (05/28 1438) Weight:  [177 lb 8 oz (80.513 kg)] 177 lb 8 oz (80.513 kg) (05/28 0627)  Hemodynamic parameters for last 24 hours:    Intake/Output from previous day: 05/27 0701 - 05/28 0700 In: 1550 [I.V.:1160; IV Piggyback:300; TPN:90] Out: 650 [Urine:650] Intake/Output this shift:    General appearance: alert and no distress no palpable subcutaneous emphysema  Lab Results:  Recent Labs  09/20/13 0500 09/21/13 0255  WBC 7.6 5.8  HGB 10.9* 10.8*  HCT 33.0* 31.9*  PLT 249 251   BMET:  Recent Labs  09/20/13 0500 09/21/13 0255  NA 141 140  K 3.7 3.1*  CL 102 102  CO2 21 24  GLUCOSE 74 101*  BUN 11 9  CREATININE 0.92 0.79  CALCIUM 8.5 8.6    PT/INR:  Recent Labs  09/19/13 1100  LABPROT 14.0  INR 1.10   ABG    Component Value Date/Time   TCO2 25 09/18/2013 2244   CBG (last 3)   Recent Labs  09/21/13 0612 09/21/13 0810 09/21/13 1602  GLUCAP 110* 121* 93    Assessment/Plan: S/P Procedure(s) (LRB): ESOPHAGOGASTRODUODENOSCOPY (EGD) (N/A) Doing well with conservative management so far  She is afebrile and feels well. Her pain has resolved.  Continue NPO, TNA, IV antibiotics  Will plan to repeat swallow Saturday with gastrograffin followed by barium  If swallow Ok will start clears and hen slowly advance diet over a few days   LOS: 3 days    Melrose Nakayama 09/21/2013

## 2013-09-21 NOTE — Progress Notes (Signed)
    Day 3 of stay      Patient name: Pamela Walters  Medical record number: [id number]  Date of birth: 06-30-1960   Patient offers no new complaints. Neck pain better. Tolerating TPN well.   Filed Vitals:   09/20/13 1900 09/20/13 2053 09/21/13 0627 09/21/13 1438  BP: 143/68 154/69 119/58 141/87  Pulse: 71 73 84 74  Temp:  100.2 F (37.9 C) 97.5 F (36.4 C) 97.7 F (36.5 C)  TempSrc:  Oral Oral Oral  Resp: 21 20 18 16   Height:      Weight:   177 lb 8 oz (80.513 kg)   SpO2: 95% 98% 98% 98%    Exam significant for less tenderness on the right side of neck and right chest.    Recent Labs Lab 09/18/13 2244 09/19/13 1100 09/20/13 0500 09/21/13 0255  NA 145 143 141 140  K 3.5* 3.8 3.7 3.1*  CL 103 107 102 102  CO2  --  24 21 24   GLUCOSE 95 82 74 101*  BUN 16 14 11 9   CREATININE 1.00 0.94 0.92 0.79  CALCIUM  --  8.8 8.5 8.6  MG  --  2.0 1.8 2.0  PHOS  --   --  2.7 2.5    Recent Labs Lab 09/18/13 2240 09/18/13 2244 09/20/13 0500 09/21/13 0255  HGB 12.2 12.2 10.9* 10.8*  HCT 36.5 36.0 33.0* 31.9*  WBC 11.0*  --  7.6 5.8  PLT 320  --  249 251    Assessment and Plan     Pneumomediastinum -  Pain improving. No hypoxia. CXR tomorrow.   Esophageal tear - On TPN, tolerating well. Swallow evaluation on Monday per Dr Roxan Hockey. Vanc and Zocyn coverage continues.   Fall in Hgb - now stable.    I have discussed the care of this patient with my IM team residents. Please see the resident note for details.  Latish Toutant 09/21/2013, 3:20 PM.

## 2013-09-21 NOTE — Progress Notes (Signed)
I have seen the patient and reviewed the daily progress note by the medical student and discussed the care of the patient with them.  See my progress note for documentation of my findings, assessment, and plans.  

## 2013-09-21 NOTE — Progress Notes (Signed)
Utilization review completed. Ahmani Daoud, RN, BSN. 

## 2013-09-21 NOTE — Progress Notes (Signed)
Subjective:   Day of hospitalization: 3  VSS.  Pt admitted with pneumomediastinum which appears less prominent on 5/27 CXR.  VSS.  Afebrile, tmax 100.2.  No leukocytosis.  Hypokalemia this AM at 3.1.    Pt has no complaints this AM and is sleeping well.  She has minimal right sided neck tenderness.    Objective:   Vital signs in last 24 hours: Filed Vitals:   09/20/13 1800 09/20/13 1900 09/20/13 2053 09/21/13 0627  BP: 146/73 143/68 154/69 119/58  Pulse: 73 71 73 84  Temp:   100.2 F (37.9 C) 97.5 F (36.4 C)  TempSrc:   Oral Oral  Resp: 27 21 20 18   Height:      Weight:    177 lb 8 oz (80.513 kg)  SpO2: 95% 95% 98% 98%    Weight: Filed Weights   09/19/13 0955 09/20/13 0500 09/21/13 0627  Weight: 177 lb 8 oz (80.513 kg) 181 lb 7 oz (82.3 kg) 177 lb 8 oz (80.513 kg)    Ins/Outs:  Intake/Output Summary (Last 24 hours) at 09/21/13 1338 Last data filed at 09/21/13 0900  Gross per 24 hour  Intake    700 ml  Output    350 ml  Net    350 ml   +962ml yesterday  Physical Exam: Constitutional: Vital signs reviewed.  Patient is lying in bed in no acute distress and cooperative with exam.   HEENT: [place]/AT; PERRL, EOMI, conjunctivae normal, no scleral icterus  Cardiovascular: RRR, no MRG Pulmonary/Chest: normal respiratory effort, no accessory muscle use, CTAB, no wheezes, rales, or rhonchi Abdominal: Soft. +BS, NT/ND Neurological: A&O x3, CN II-XII grossly intact; non-focal exam Extremities: 2+DP b/l, no C/C/E  Skin: Warm, dry and intact. No rash  Lab Results:  BMP:  Recent Labs Lab 09/20/13 0500 09/21/13 0255  NA 141 140  K 3.7 3.1*  CL 102 102  CO2 21 24  GLUCOSE 74 101*  BUN 11 9  CREATININE 0.92 0.79  CALCIUM 8.5 8.6  MG 1.8 2.0  PHOS 2.7 2.5   Anion Gap:  14  CBC:  Recent Labs Lab 09/18/13 2240  09/20/13 0500 09/21/13 0255  WBC 11.0*  --  7.6 5.8  NEUTROABS 8.2*  --  5.5  --   HGB 12.2  < > 10.9* 10.8*  HCT 36.5  < > 33.0* 31.9*  MCV  80.4  --  81.5 79.9  PLT 320  --  249 251  < > = values in this interval not displayed.  Coagulation:  Recent Labs Lab 09/19/13 1100  LABPROT 14.0  INR 1.10    CBG:          Recent Labs Lab 09/20/13 1246 09/20/13 1646 09/20/13 2102 09/21/13 0024 09/21/13 0612 09/21/13 0810  GLUCAP 75 72 95 132* 110* 121*           HA1C:      No results found for this basename: HGBA1C,  in the last 168 hours  Lipid Panel:  Recent Labs Lab 09/21/13 0255  TRIG 216*    LFTs:  Recent Labs Lab 09/20/13 0500 09/21/13 0255  AST 20 18  ALT 12 10  ALKPHOS 52 47  BILITOT 0.7 0.4  PROT 6.4 6.5  ALBUMIN 3.3* 3.2*    Pancreatic Enzymes: No results found for this basename: LIPASE, AMYLASE,  in the last 168 hours  Lactic Acid/Procalcitonin:  Recent Labs Lab 09/19/13 1100  LATICACIDVEN 1.0    Ammonia: No results found for  this basename: AMMONIA,  in the last 168 hours  Cardiac Enzymes:  Recent Labs Lab 09/19/13 1100 09/19/13 1725 09/19/13 2145  TROPONINI <0.30 <0.30 <0.30    EKG: EKG Interpretation  Date/Time:    Ventricular Rate:    PR Interval:    QRS Duration:   QT Interval:    QTC Calculation:   R Axis:     Text Interpretation:     BNP: No results found for this basename: PROBNP,  in the last 168 hours  D-Dimer: No results found for this basename: DDIMER,  in the last 168 hours  Urinalysis: No results found for this basename: COLORURINE, APPERANCEUR, LABSPEC, PHURINE, GLUCOSEU, HGBUR, BILIRUBINUR, KETONESUR, PROTEINUR, UROBILINOGEN, NITRITE, LEUKOCYTESUR,  in the last 168 hours  Micro Results: Recent Results (from the past 240 hour(s))  MRSA PCR SCREENING     Status: Abnormal   Collection Time    09/19/13 10:07 AM      Result Value Ref Range Status   MRSA by PCR POSITIVE (*) NEGATIVE Final   Comment:            The GeneXpert MRSA Assay (FDA     approved for NASAL specimens     only), is one component of a     comprehensive MRSA  colonization     surveillance program. It is not     intended to diagnose MRSA     infection nor to guide or     monitor treatment for     MRSA infections.     RESULT CALLED TO, READ BACK BY AND VERIFIED WITH:     WHITE RN 11:50 09/19/13 ([email or web address])  CULTURE, BLOOD (ROUTINE X 2)     Status: None   Collection Time    09/19/13 11:00 AM      Result Value Ref Range Status   Specimen Description BLOOD LEFT ANTECUBITAL   Final   Special Requests BOTTLES DRAWN AEROBIC ONLY 3CC   Final   Culture  Setup Time     Final   Value: 09/19/2013 16:25     Performed at [facility]   Culture     Final   Value:        BLOOD CULTURE RECEIVED NO GROWTH TO DATE CULTURE WILL BE HELD FOR 5 DAYS BEFORE ISSUING A FINAL NEGATIVE REPORT     Performed at [facility]   Report Status PENDING   Incomplete  CULTURE, BLOOD (ROUTINE X 2)     Status: None   Collection Time    09/19/13 11:14 AM      Result Value Ref Range Status   Specimen Description BLOOD LEFT HAND   Final   Special Requests BOTTLES DRAWN AEROBIC ONLY 5CC   Final   Culture  Setup Time     Final   Value: 09/19/2013 16:25     Performed at [facility]   Culture     Final   Value:        BLOOD CULTURE RECEIVED NO GROWTH TO DATE CULTURE WILL BE HELD FOR 5 DAYS BEFORE ISSUING A FINAL NEGATIVE REPORT     Performed at [facility]   Report Status PENDING   Incomplete    Blood Culture:    Component Value Date/Time   SDES BLOOD LEFT HAND 09/19/2013 1114    Studies/Results: X-ray Chest Pa And Lateral   09/20/2013   CLINICAL DATA:  Followup esophageal rupture.  EXAM: CHEST  2 VIEW  COMPARISON:  09/18/2013  FINDINGS: Pneumomediastinum and neck base air is less apparent on the current study than on the prior exam.  No pneumothorax.  Lungs are clear.  Minimal pleural effusions.  Cardiac silhouette is normal in size. Normal mediastinal and hilar contours. Right PICC tip lies at the caval atrial junction.  IMPRESSION:  Pneumomediastinum and neck base air appears less prominent. No acute findings in the lungs. No pneumothorax. The right PICC is well positioned.   Electronically Signed   By: Lajean Manes M.D.   On: 09/20/2013 08:03    Medications:  Scheduled Meds: . Chlorhexidine Gluconate Cloth  6 each Topical Q0600  . heparin  5,000 Units Subcutaneous 3 times per day  . insulin aspart  0-9 Units Subcutaneous 6 times per day  . mupirocin ointment  1 application Nasal BID  . pantoprazole (PROTONIX) IV  40 mg Intravenous Q12H  . piperacillin-tazobactam (ZOSYN)  IV  3.375 g Intravenous Q8H  . potassium chloride  10 mEq Intravenous Q1 Hr x 4  . sodium chloride  3 mL Intravenous Q12H  . vancomycin  1,000 mg Intravenous Q12H   Continuous Infusions: . sodium chloride 60 mL/hr at 09/20/13 1810  . sodium chloride    . Marland KitchenTPN (CLINIMIX-E) Adult     And  . fat emulsion    . Marland KitchenTPN (CLINIMIX-E) Adult 40 mL/hr at 09/20/13 1701   And  . fat emulsion 250 mL (09/20/13 1700)   PRN Meds: LORazepam, morphine injection, ondansetron (ZOFRAN) IV, sodium chloride  Antibiotics: Antibiotics Given (last 72 hours)   Date/Time Action Medication Dose Rate   09/19/13 1335 Given   vancomycin (VANCOCIN) IVPB 1000 mg/200 mL premix 1,000 mg 200 mL/hr   09/19/13 1505 Given   piperacillin-tazobactam (ZOSYN) IVPB 3.375 g 3.375 g 12.5 mL/hr   09/19/13 1506 Given   piperacillin-tazobactam (ZOSYN) IVPB 3.375 g 3.375 g 12.5 mL/hr   09/19/13 2156 Given   vancomycin (VANCOCIN) IVPB 1000 mg/200 mL premix 1,000 mg 200 mL/hr   09/20/13 0236 Given   piperacillin-tazobactam (ZOSYN) IVPB 3.375 g 3.375 g 12.5 mL/hr   09/20/13 0945 Given   vancomycin (VANCOCIN) IVPB 1000 mg/200 mL premix 1,000 mg 200 mL/hr   09/20/13 1047 Given   piperacillin-tazobactam (ZOSYN) IVPB 3.375 g 3.375 g 12.5 mL/hr   09/20/13 1809 Given   piperacillin-tazobactam (ZOSYN) IVPB 3.375 g 3.375 g 12.5 mL/hr   09/20/13 2210 Given   vancomycin (VANCOCIN) IVPB 1000  mg/200 mL premix 1,000 mg 200 mL/hr   09/21/13 0235 Given   piperacillin-tazobactam (ZOSYN) IVPB 3.375 g 3.375 g 12.5 mL/hr   09/21/13 1051 Given   piperacillin-tazobactam (ZOSYN) IVPB 3.375 g 3.375 g 12.5 mL/hr   09/21/13 1051 Given   vancomycin (VANCOCIN) IVPB 1000 mg/200 mL premix 1,000 mg 200 mL/hr      Day of Hospitalization: 3  Consults: Treatment Team:  Melrose Nakayama, MD  Assessment/Plan:   Principal Problem:   Esophageal obstruction due to food impaction Active Problems:   HYPERLIPIDEMIA   Obesity, unspecified   DEPRESSION   MULTIPLE SCLEROSIS, PROGRESSIVE/RELAPSING   HYPERTENSION   Chest pain   Esophageal rupture   Hypokalemia  Esophageal perforation complicated by pneumomediastinum  Improving.  Pt p/w with esophageal food impaction and underwent upper endoscopy with esophageal mucosa tear underneath.  Later developed CP/R neck pain. Given multiple CV risk factors, EKG obtained and trended troponins.  CVTS consulted and medical management.  CXR yesterday shows pneumomediastinum less prominent with PICC line in place.  Transferred to tele yesterday.  -  continue strict NPO>>avoid all oral intake  -IVF   -BCx pending-->>NGTD (zosyn given in ED prior to BCx)  -continue vancomycin/zosyn  -Intravenous PPI BID  -continue TNA; need to replete potassium -will need speech evaluation once she is stabilized -will need GI follow-up for possible dilation  -morphine prn pain, zofran prn nausea  Hypertension Stable.  On home maxzide and pravachol -hold home meds  -may need IV BP med  Dyslipidemia -hold all oral meds   Depression -on home prozac; hold for now  h/o MS -Not on home meds  FEN-hypokalemia -continue TPN; replete potassium   VTE PPx  5000 Units Heparin SQ tid  Disposition Disposition is deferred, awaiting improvement of current medical problems.  Anticipated discharge in approximately 1-2 day(s).     LOS: 3 days   Jones Bales,  MD PGY-1, Internal Medicine Teaching Service [phone] (7AM-5PM Mon-Fri) 09/21/2013, 1:38 PM

## 2013-09-21 NOTE — Progress Notes (Signed)
Agree with student Reesa Chew note see Dr. Charlyne Petrin note for more details.  Aundra Dubin MD

## 2013-09-21 NOTE — Progress Notes (Signed)
Subjective:Patient was comfortably lying in her bed, states that her neck pain has improved and now it's just mildly sore on touch. No abdominal pain, N/V. Had a small bowel movement yesterday. No dysuria. Objective: Vital signs in last 24 hours: Filed Vitals:   09/20/13 1800 09/20/13 1900 09/20/13 2053 09/21/13 0627  BP: 146/73 143/68 154/69 119/58  Pulse: 73 71 73 84  Temp:   100.2 F (37.9 C) 97.5 F (36.4 C)  TempSrc:   Oral Oral  Resp: 27 21 20 18   Height:      Weight:    80.513 kg (177 lb 8 oz)  SpO2: 95% 95% 98% 98%   Weight change: 0 kg (0 lb)  Intake/Output Summary (Last 24 hours) at 09/21/13 1315 Last data filed at 09/21/13 0900  Gross per 24 hour  Intake    700 ml  Output    350 ml  Net    350 ml  EXAM:  Patient was well oriented, and comfortable.  HEENT: AT/[place], PERRL.EOMI.  NECK: Mildly tender right neck, no adenopathy. No thyromegaly  LUNGS: clear B/L, no added sound  HEART: S! And S2 , no MRG  ABD: S/NT/ND,, no rebound or guarding. BS + . No HSM  EXT: no edema, no cyanosis. PP 2+ B/L  NEURO: A & O X 3, CN 2-12 grossly intact. Strength and sensation normal B/L.   Lab Results: @LABTEST2 @ Micro Results: Recent Results (from the past 240 hour(s))  MRSA PCR SCREENING     Status: Abnormal   Collection Time    09/19/13 10:07 AM      Result Value Ref Range Status   MRSA by PCR POSITIVE (*) NEGATIVE Final   Comment:            The GeneXpert MRSA Assay (FDA     approved for NASAL specimens     only), is one component of a     comprehensive MRSA colonization     surveillance program. It is not     intended to diagnose MRSA     infection nor to guide or     monitor treatment for     MRSA infections.     RESULT CALLED TO, READ BACK BY AND VERIFIED WITH:     WHITE RN 11:50 09/19/13 ([email or web address])  CULTURE, BLOOD (ROUTINE X 2)     Status: None   Collection Time    09/19/13 11:00 AM      Result Value Ref Range Status   Specimen Description BLOOD LEFT ANTECUBITAL    Final   Special Requests BOTTLES DRAWN AEROBIC ONLY 3CC   Final   Culture  Setup Time     Final   Value: 09/19/2013 16:25     Performed at [facility]   Culture     Final   Value:        BLOOD CULTURE RECEIVED NO GROWTH TO DATE CULTURE WILL BE HELD FOR 5 DAYS BEFORE ISSUING A FINAL NEGATIVE REPORT     Performed at [facility]   Report Status PENDING   Incomplete  CULTURE, BLOOD (ROUTINE X 2)     Status: None   Collection Time    09/19/13 11:14 AM      Result Value Ref Range Status   Specimen Description BLOOD LEFT HAND   Final   Special Requests BOTTLES DRAWN AEROBIC ONLY 5CC   Final   Culture  Setup Time     Final   Value: 09/19/2013 16:25  Performed at [facility]     Final   Value:        BLOOD CULTURE RECEIVED NO GROWTH TO DATE CULTURE WILL BE HELD FOR 5 DAYS BEFORE ISSUING A FINAL NEGATIVE REPORT     Performed at [facility]   Report Status PENDING   Incomplete   Studies/Results: X-ray Chest Pa And Lateral   09/20/2013   CLINICAL DATA:  Followup esophageal rupture.  EXAM: CHEST  2 VIEW  COMPARISON:  09/18/2013  FINDINGS: Pneumomediastinum and neck base air is less apparent on the current study than on the prior exam.  No pneumothorax.  Lungs are clear.  Minimal pleural effusions.  Cardiac silhouette is normal in size. Normal mediastinal and hilar contours. Right PICC tip lies at the caval atrial junction.  IMPRESSION: Pneumomediastinum and neck base air appears less prominent. No acute findings in the lungs. No pneumothorax. The right PICC is well positioned.   Electronically Signed   By: Lajean Manes M.D.   On: 09/20/2013 08:03   Medications: medication reviewed Scheduled Meds: . Chlorhexidine Gluconate Cloth  6 each Topical Q0600  . heparin  5,000 Units Subcutaneous 3 times per day  . insulin aspart  0-9 Units Subcutaneous 6 times per day  . mupirocin ointment  1 application Nasal BID  . pantoprazole (PROTONIX) IV  40 mg  Intravenous Q12H  . piperacillin-tazobactam (ZOSYN)  IV  3.375 g Intravenous Q8H  . potassium chloride  10 mEq Intravenous Q1 Hr x 4  . sodium chloride  3 mL Intravenous Q12H  . vancomycin  1,000 mg Intravenous Q12H   Continuous Infusions: . sodium chloride 60 mL/hr at 09/20/13 1810  . sodium chloride    . Marland KitchenTPN (CLINIMIX-E) Adult     And  . fat emulsion    . Marland KitchenTPN (CLINIMIX-E) Adult 40 mL/hr at 09/20/13 1701   And  . fat emulsion 250 mL (09/20/13 1700)   PRN Meds:.LORazepam, morphine injection, ondansetron (ZOFRAN) IV, sodium chloride Assessment/Plan: ESOPHAGEAL PERFORATION:She had EGD to remove food impaction on 5/25. She was noted to have a mucosal tear. A CT showed air in the mediastinum and soft tissues of the neck consistent with a perforation. Gastrograffin study did not show any leak or extravasation of contrast. CTS following and are planning to manage non-operatively. She is NPO with IVF's and IV antibiotics (Vanco and Zosyn). Had PICC line placed and TNA  started yesterday. Her follow up CXR doesn't show any expansion of pneumomediastinum and clinically patient was stable , continue with conservative management as advised by CT. GI discharged her with the recommendation about GI follow up and that she might get benefit from dilation later on to prevent any future incidences. She might go home with home care for TPN? HYPOKALEMIA: Her K was 3.1 today, dropped from 3.7. Pharmacy was contacting regarding adding some K in her TPN. HTN; Her BP is stable currently with out any meds. Monitor and if it start going up, can add some IV meds. As she is NPO.  HYPERLIPIDEMIA; Hold all her home meds. Currently, can resume after she resumes PO.  MS: looks non progressive, not on any treatment. Can be managed if any symptoms arises.      This is a Careers information officer Note.  The care of the patient was discussed with Dr. Gordy Levan and the assessment and plan formulated with their assistance.  Please see  their attached note for official documentation of the daily encounter.  LOS: 3 days   Lorella Nimrod, Med Student 09/21/2013, 1:15 PM

## 2013-09-22 LAB — BASIC METABOLIC PANEL
BUN: 11 mg/dL (ref 6–23)
CALCIUM: 9.1 mg/dL (ref 8.4–10.5)
CO2: 26 mEq/L (ref 19–32)
Chloride: 104 mEq/L (ref 96–112)
Creatinine, Ser: 0.85 mg/dL (ref 0.50–1.10)
GFR calc Af Amer: 88 mL/min — ABNORMAL LOW (ref >90)
GFR, EST NON AFRICAN AMERICAN: 76 mL/min — AB (ref >90)
Glucose, Bld: 113 mg/dL — ABNORMAL HIGH (ref 70–99)
POTASSIUM: 3.6 meq/L — AB (ref 3.7–5.3)
Sodium: 141 mEq/L (ref 137–147)

## 2013-09-22 LAB — GLUCOSE, CAPILLARY
GLUCOSE-CAPILLARY: 103 mg/dL — AB (ref 70–99)
Glucose-Capillary: 115 mg/dL — ABNORMAL HIGH (ref 70–99)
Glucose-Capillary: 85 mg/dL (ref 70–99)
Glucose-Capillary: 106 mg/dL — ABNORMAL HIGH (ref 70–99)

## 2013-09-22 LAB — VANCOMYCIN, TROUGH: Vancomycin Tr: 10.8 ug/mL (ref 10.0–20.0)

## 2013-09-22 MED ORDER — TRACE MINERALS CR-CU-F-FE-I-MN-MO-SE-ZN IV SOLN
INTRAVENOUS | Status: AC
  Administered 2013-09-22: 17:00:00 via INTRAVENOUS
  Filled 2013-09-22: qty 2000

## 2013-09-22 MED ORDER — POTASSIUM CHLORIDE 10 MEQ/100ML IV SOLN
10.0000 meq | INTRAVENOUS | Status: AC
  Administered 2013-09-22 (×3): 10 meq via INTRAVENOUS
  Filled 2013-09-22 (×3): qty 100

## 2013-09-22 MED ORDER — POTASSIUM CHLORIDE 10 MEQ/100ML IV SOLN
10.0000 meq | INTRAVENOUS | Status: DC
  Filled 2013-09-22 (×5): qty 100

## 2013-09-22 MED ORDER — VANCOMYCIN HCL 10 G IV SOLR
1250.0000 mg | Freq: Two times a day (BID) | INTRAVENOUS | Status: DC
  Administered 2013-09-22 – 2013-09-25 (×6): 1250 mg via INTRAVENOUS
  Filled 2013-09-22 (×7): qty 1250

## 2013-09-22 MED ORDER — FAT EMULSION 20 % IV EMUL
240.0000 mL | INTRAVENOUS | Status: AC
  Administered 2013-09-22: 240 mL via INTRAVENOUS
  Filled 2013-09-22: qty 250

## 2013-09-22 MED ORDER — INSULIN ASPART 100 UNIT/ML ~~LOC~~ SOLN
0.0000 [IU] | Freq: Four times a day (QID) | SUBCUTANEOUS | Status: DC
Start: 2013-09-22 — End: 2013-09-25

## 2013-09-22 NOTE — Progress Notes (Signed)
Agree with medical student note see Dr. Charlyne Petrin note for more details.   Aundra Dubin MD

## 2013-09-22 NOTE — Progress Notes (Signed)
Subjective:   Day of hospitalization: 4  VSS.  Pt admitted with pneumomediastinum which appears less prominent on 5/27 CXR.  VSS.  Afebrile, tmax 97.8 yesterday.  No leukocytosis.  Hypokalemia this AM at 3.6.  She still has some R neck tenderness but no other complaints.     Objective:   Vital signs in last 24 hours: Filed Vitals:   09/21/13 0627 09/21/13 1438 09/21/13 1935 09/22/13 0510  BP: 119/58 141/87 130/70   Pulse: 84 74 76   Temp: 97.5 F (36.4 C) 97.7 F (36.5 C) 97.8 F (36.6 C)   TempSrc: Oral Oral Oral   Resp: 18 16 18    Height:      Weight: 177 lb 8 oz (80.513 kg)   176 lb 2.4 oz (79.9 kg)  SpO2: 98% 98% 95%     Weight: Filed Weights   09/20/13 0500 09/21/13 0627 09/22/13 0510  Weight: 181 lb 7 oz (82.3 kg) 177 lb 8 oz (80.513 kg) 176 lb 2.4 oz (79.9 kg)    Ins/Outs:  Intake/Output Summary (Last 24 hours) at 09/22/13 1144 Last data filed at 09/22/13 0200  Gross per 24 hour  Intake    250 ml  Output      0 ml  Net    250 ml   +262ml yesterday  Physical Exam: Constitutional: Vital signs reviewed.  Patient is sitting up in bed in no acute distress and cooperative with exam.   HEENT: [place]/AT; PERRL, EOMI, conjunctivae normal, no scleral icterus  Cardiovascular: RRR, no MRG Pulmonary/Chest: normal respiratory effort, no accessory muscle use, CTAB, no wheezes, rales, or rhonchi Abdominal: Soft. +BS, NT/ND Neurological: A&O x3, CN II-XII grossly intact; non-focal exam Extremities: 2+DP b/l, no C/C/E  Skin: Warm, dry and intact. No rash  Lab Results:  BMP:  Recent Labs Lab 09/20/13 0500 09/21/13 0255 09/22/13 0640  NA 141 140 141  K 3.7 3.1* 3.6*  CL 102 102 104  CO2 21 24 26   GLUCOSE 74 101* 113*  BUN 11 9 11   CREATININE 0.92 0.79 0.85  CALCIUM 8.5 8.6 9.1  MG 1.8 2.0  --   PHOS 2.7 2.5  --    Anion Gap:  11  CBC:  Recent Labs Lab 09/18/13 2240  09/20/13 0500 09/21/13 0255  WBC 11.0*  --  7.6 5.8  NEUTROABS 8.2*  --  5.5  --    HGB 12.2  < > 10.9* 10.8*  HCT 36.5  < > 33.0* 31.9*  MCV 80.4  --  81.5 79.9  PLT 320  --  249 251  < > = values in this interval not displayed.  Coagulation:  Recent Labs Lab 09/19/13 1100  LABPROT 14.0  INR 1.10    CBG:          Recent Labs Lab 09/21/13 0612 09/21/13 0810 09/21/13 1602 09/21/13 2201 09/22/13 0505 09/22/13 0952  GLUCAP 110* 121* 93 103* 115* 85           HA1C:      No results found for this basename: HGBA1C,  in the last 168 hours  Lipid Panel:  Recent Labs Lab 09/21/13 0255  TRIG 216*    LFTs:  Recent Labs Lab 09/20/13 0500 09/21/13 0255  AST 20 18  ALT 12 10  ALKPHOS 52 47  BILITOT 0.7 0.4  PROT 6.4 6.5  ALBUMIN 3.3* 3.2*    Pancreatic Enzymes: No results found for this basename: LIPASE, AMYLASE,  in the last 168 hours  Lactic Acid/Procalcitonin:  Recent Labs Lab 09/19/13 1100  LATICACIDVEN 1.0    Ammonia: No results found for this basename: AMMONIA,  in the last 168 hours  Cardiac Enzymes:  Recent Labs Lab 09/19/13 1100 09/19/13 1725 09/19/13 2145  TROPONINI <0.30 <0.30 <0.30    EKG: EKG Interpretation  Date/Time:    Ventricular Rate:    PR Interval:    QRS Duration:   QT Interval:    QTC Calculation:   R Axis:     Text Interpretation:     BNP: No results found for this basename: PROBNP,  in the last 168 hours  D-Dimer: No results found for this basename: DDIMER,  in the last 168 hours  Urinalysis: No results found for this basename: COLORURINE, APPERANCEUR, LABSPEC, PHURINE, GLUCOSEU, HGBUR, BILIRUBINUR, KETONESUR, PROTEINUR, UROBILINOGEN, NITRITE, LEUKOCYTESUR,  in the last 168 hours  Micro Results: Recent Results (from the past 240 hour(s))  MRSA PCR SCREENING     Status: Abnormal   Collection Time    09/19/13 10:07 AM      Result Value Ref Range Status   MRSA by PCR POSITIVE (*) NEGATIVE Final   Comment:            The GeneXpert MRSA Assay (FDA     approved for NASAL specimens      only), is one component of a     comprehensive MRSA colonization     surveillance program. It is not     intended to diagnose MRSA     infection nor to guide or     monitor treatment for     MRSA infections.     RESULT CALLED TO, READ BACK BY AND VERIFIED WITH:     WHITE RN 11:50 09/19/13 ([email or web address])  CULTURE, BLOOD (ROUTINE X 2)     Status: None   Collection Time    09/19/13 11:00 AM      Result Value Ref Range Status   Specimen Description BLOOD LEFT ANTECUBITAL   Final   Special Requests BOTTLES DRAWN AEROBIC ONLY 3CC   Final   Culture  Setup Time     Final   Value: 09/19/2013 16:25     Performed at [facility]   Culture     Final   Value:        BLOOD CULTURE RECEIVED NO GROWTH TO DATE CULTURE WILL BE HELD FOR 5 DAYS BEFORE ISSUING A FINAL NEGATIVE REPORT     Performed at [facility]   Report Status PENDING   Incomplete  CULTURE, BLOOD (ROUTINE X 2)     Status: None   Collection Time    09/19/13 11:14 AM      Result Value Ref Range Status   Specimen Description BLOOD LEFT HAND   Final   Special Requests BOTTLES DRAWN AEROBIC ONLY 5CC   Final   Culture  Setup Time     Final   Value: 09/19/2013 16:25     Performed at [facility]   Culture     Final   Value:        BLOOD CULTURE RECEIVED NO GROWTH TO DATE CULTURE WILL BE HELD FOR 5 DAYS BEFORE ISSUING A FINAL NEGATIVE REPORT     Performed at [facility]   Report Status PENDING   Incomplete    Blood Culture:    Component Value Date/Time   SDES BLOOD LEFT HAND 09/19/2013 1114    Studies/Results: No results found.  Medications:  Scheduled  Meds: . Chlorhexidine Gluconate Cloth  6 each Topical Q0600  . heparin  5,000 Units Subcutaneous 3 times per day  . insulin aspart  0-9 Units Subcutaneous 4 times per day  . mupirocin ointment  1 application Nasal BID  . pantoprazole (PROTONIX) IV  40 mg Intravenous Q12H  . piperacillin-tazobactam (ZOSYN)  IV  3.375 g Intravenous Q8H  . potassium  chloride  10 mEq Intravenous Q1 Hr x 3  . sodium chloride  3 mL Intravenous Q12H  . vancomycin  1,000 mg Intravenous Q12H   Continuous Infusions: . sodium chloride 40 mL/hr at 09/21/13 1825  . Marland KitchenTPN (CLINIMIX-E) Adult 60 mL/hr at 09/21/13 1738   And  . fat emulsion 240 mL (09/21/13 1738)  . Marland KitchenTPN (CLINIMIX-E) Adult     And  . fat emulsion     PRN Meds: LORazepam, morphine injection, ondansetron (ZOFRAN) IV, sodium chloride  Antibiotics: Antibiotics Given (last 72 hours)   Date/Time Action Medication Dose Rate   09/19/13 1335 Given   vancomycin (VANCOCIN) IVPB 1000 mg/200 mL premix 1,000 mg 200 mL/hr   09/19/13 1505 Given   piperacillin-tazobactam (ZOSYN) IVPB 3.375 g 3.375 g 12.5 mL/hr   09/19/13 1506 Given   piperacillin-tazobactam (ZOSYN) IVPB 3.375 g 3.375 g 12.5 mL/hr   09/19/13 2156 Given   vancomycin (VANCOCIN) IVPB 1000 mg/200 mL premix 1,000 mg 200 mL/hr   09/20/13 0236 Given   piperacillin-tazobactam (ZOSYN) IVPB 3.375 g 3.375 g 12.5 mL/hr   09/20/13 0945 Given   vancomycin (VANCOCIN) IVPB 1000 mg/200 mL premix 1,000 mg 200 mL/hr   09/20/13 1047 Given   piperacillin-tazobactam (ZOSYN) IVPB 3.375 g 3.375 g 12.5 mL/hr   09/20/13 1809 Given   piperacillin-tazobactam (ZOSYN) IVPB 3.375 g 3.375 g 12.5 mL/hr   09/20/13 2210 Given   vancomycin (VANCOCIN) IVPB 1000 mg/200 mL premix 1,000 mg 200 mL/hr   09/21/13 0235 Given   piperacillin-tazobactam (ZOSYN) IVPB 3.375 g 3.375 g 12.5 mL/hr   09/21/13 1051 Given   piperacillin-tazobactam (ZOSYN) IVPB 3.375 g 3.375 g 12.5 mL/hr   09/21/13 1051 Given   vancomycin (VANCOCIN) IVPB 1000 mg/200 mL premix 1,000 mg 200 mL/hr   09/21/13 1825 Given   piperacillin-tazobactam (ZOSYN) IVPB 3.375 g 3.375 g 12.5 mL/hr   09/21/13 2211 Given   vancomycin (VANCOCIN) IVPB 1000 mg/200 mL premix 1,000 mg 200 mL/hr   09/22/13 0200 Given   piperacillin-tazobactam (ZOSYN) IVPB 3.375 g 3.375 g 12.5 mL/hr      Day of  Hospitalization: 4  Consults: Treatment Team:  Melrose Nakayama, MD  Assessment/Plan:   Principal Problem:   Esophageal obstruction due to food impaction Active Problems:   HYPERLIPIDEMIA   Obesity, unspecified   DEPRESSION   MULTIPLE SCLEROSIS, PROGRESSIVE/RELAPSING   HYPERTENSION   Chest pain   Esophageal rupture   Hypokalemia  Esophageal perforation complicated by pneumomediastinum  Improving.  Pt p/w with esophageal food impaction and underwent upper endoscopy with esophageal mucosa tear underneath.  Later developed CP/R neck pain. Given multiple CV risk factors, EKG obtained and trended troponins.  CVTS consulted and medical management.  CXR yesterday shows pneumomediastinum less prominent with PICC line in place.   -continue strict NPO>>avoid all oral intake  -IVF   -BCx pending-->>NGTD (zosyn given in ED prior to BCx)  -continue vancomycin/zosyn  -Intravenous PPI BID  -continue TNA; need to replete potassium -will need speech evaluation possibly tomorrow -will need GI follow-up for possible dilation  -morphine prn pain, zofran prn nausea  Hypertension Stable.  On home maxzide and pravachol -hold home meds  -may need IV BP med  Dyslipidemia -hold all oral meds   Depression -on home prozac; hold for now  h/o MS -Not on home meds  FEN-hypokalemia -continue TPN; replete potassium   VTE PPx  5000 Units Heparin SQ tid  Disposition Disposition is deferred, awaiting improvement of current medical problems.  Anticipated discharge in approximately 1-2 day(s).     LOS: 4 days   Jones Bales, MD PGY-1, Internal Medicine Teaching Service [phone] (7AM-5PM Mon-Fri) 09/22/2013, 11:44 AM

## 2013-09-22 NOTE — Progress Notes (Signed)
PARENTERAL NUTRITION CONSULT NOTE - FOLLOW UP  Pharmacy Consult for TPN Indication: Esophageal perforation  No Known Allergies  Patient Measurements: Height: 5\' 3"  (160 cm) Weight: 176 lb 2.4 oz (79.9 kg) IBW/kg (Calculated) : 52.4 Adjusted Body Weight:  Usual Weight:   Vital Signs: Temp: 97.8 F (36.6 C) (05/28 1935) Temp src: Oral (05/28 1935) BP: 130/70 mmHg (05/28 1935) Pulse Rate: 76 (05/28 1935) Intake/Output from previous day: 05/28 0701 - 05/29 0700 In: 250 [IV Piggyback:250] Out: -  Intake/Output from this shift:    Labs:  Recent Labs  09/19/13 1100 09/20/13 0500 09/21/13 0255  WBC  --  7.6 5.8  HGB  --  10.9* 10.8*  HCT  --  33.0* 31.9*  PLT  --  249 251  APTT 29  --   --   INR 1.10  --   --      Recent Labs  09/19/13 1100 09/20/13 0500 09/21/13 0255  NA 143 141 140  K 3.8 3.7 3.1*  CL 107 102 102  CO2 24 21 24   GLUCOSE 82 74 101*  BUN 14 11 9   CREATININE 0.94 0.92 0.79  CALCIUM 8.8 8.5 8.6  MG 2.0 1.8 2.0  PHOS  --  2.7 2.5  PROT 6.6 6.4 6.5  ALBUMIN 3.4* 3.3* 3.2*  AST 17 20 18   ALT 12 12 10   ALKPHOS 54 52 47  BILITOT 0.7 0.7 0.4  PREALBUMIN  --  17.4*  --   TRIG  --  189* 216*   Estimated Creatinine Clearance: 79.5 ml/min (by C-G formula based on Cr of 0.79).    Recent Labs  09/21/13 1602 09/21/13 2201 09/22/13 0505  GLUCAP 93 103* 115*    Medications:  Scheduled:  . Chlorhexidine Gluconate Cloth  6 each Topical Q0600  . heparin  5,000 Units Subcutaneous 3 times per day  . insulin aspart  0-9 Units Subcutaneous 6 X Daily  . mupirocin ointment  1 application Nasal BID  . pantoprazole (PROTONIX) IV  40 mg Intravenous Q12H  . piperacillin-tazobactam (ZOSYN)  IV  3.375 g Intravenous Q8H  . potassium chloride  10 mEq Intravenous Q1 Hr x 5  . sodium chloride  3 mL Intravenous Q12H  . vancomycin  1,000 mg Intravenous Q12H    Insulin Requirements in the past 24 hours:  0 units Sensitive SSI   Current Nutrition:   Clinimix E 5/15 at 60 ml/hr and lipids at 4ml/hr Nutritional Goals:  1800-2000 kcal and 90-100g pro per RD assessment.  Goal rate of 62ml/hr will meet 100% of these needs.  Assessment:  53 yo lady who presented with food impaction >24 hours. She had an EGD to remove food and was noted to have a mucosal tear. Gastrograffin swallow did not show contrast leak. Plan to manage her non-operatively. PICC line was placed for TPN.   GI:  Esophageal perforation.  Plan to repeat swallow on Sat  Endo:  No hx DM.  CBGs 93-121 since TPN initiated  Lytes:  K 3.6  MD repleting  Renal:   Cr < 1, UOP 0.79ml/kg/hr  Pulm:  RA  Cards:  BP soft, HR wnl  Hepatobil: baseline LFTs wnl, TG 216, alb 3.3.  Baseline PALB 17.4  Neuro:  h/o multiple sclerosis on no home meds   ID:  Vanc/Zosyn (5/26 >> ) for esophageal perforation.  AFeb, WBC wnl  Best Practices: Heparin SQ TPN Access:  PICC 5/26 TPN day#:  5/28 >>  Plan:  -Advance Clinimix-E  5/15 to 3ml/hr and continue Lipids at 78ml/hr -Sensitive SSI q6 -Decrease IVF to [place] in AM  Excell Seltzer, PharmD [facility]

## 2013-09-22 NOTE — Progress Notes (Signed)
Subjective: Patient was sitting on her bed side, no complaints, states that her R neck soreness has decreased,no CP, SOB,abd, pain, N/V. Had bowel movement this morning. Objective: Vital signs in last 24 hours: Filed Vitals:   09/21/13 0627 09/21/13 1438 09/21/13 1935 09/22/13 0510  BP: 119/58 141/87 130/70   Pulse: 84 74 76   Temp: 97.5 F (36.4 C) 97.7 F (36.5 C) 97.8 F (36.6 C)   TempSrc: Oral Oral Oral   Resp: 18 16 18    Height:      Weight: 80.513 kg (177 lb 8 oz)   79.9 kg (176 lb 2.4 oz)  SpO2: 98% 98% 95%    Weight change: -0.613 kg (-1 lb 5.6 oz)  Intake/Output Summary (Last 24 hours) at 09/22/13 1121 Last data filed at 09/22/13 0200  Gross per 24 hour  Intake    250 ml  Output      0 ml  Net    250 ml  EXAM:  Patient was well oriented, and comfortable.  HEENT: AT/[place], PERRL.EOMI.  NECK: Mildly tender right neck, no adenopathy. No thyromegaly  LUNGS: clear B/L, no added sound  HEART: S! And S2 , no MRG  ABD: S/NT/ND,, no rebound or guarding. BS + . No HSM  EXT: no edema, no cyanosis. PP 2+ B/L  NEURO: A & O X 3, CN 2-12 grossly intact. Strength and sensation normal B/L.      Lab Results: @LABTEST2 @ Micro Results: Recent Results (from the past 240 hour(s))  MRSA PCR SCREENING     Status: Abnormal   Collection Time    09/19/13 10:07 AM      Result Value Ref Range Status   MRSA by PCR POSITIVE (*) NEGATIVE Final   Comment:            The GeneXpert MRSA Assay (FDA     approved for NASAL specimens     only), is one component of a     comprehensive MRSA colonization     surveillance program. It is not     intended to diagnose MRSA     infection nor to guide or     monitor treatment for     MRSA infections.     RESULT CALLED TO, READ BACK BY AND VERIFIED WITH:     WHITE RN 11:50 09/19/13 ([email or web address])  CULTURE, BLOOD (ROUTINE X 2)     Status: None   Collection Time    09/19/13 11:00 AM      Result Value Ref Range Status   Specimen Description BLOOD LEFT  ANTECUBITAL   Final   Special Requests BOTTLES DRAWN AEROBIC ONLY 3CC   Final   Culture  Setup Time     Final   Value: 09/19/2013 16:25     Performed at [facility]   Culture     Final   Value:        BLOOD CULTURE RECEIVED NO GROWTH TO DATE CULTURE WILL BE HELD FOR 5 DAYS BEFORE ISSUING A FINAL NEGATIVE REPORT     Performed at [facility]   Report Status PENDING   Incomplete  CULTURE, BLOOD (ROUTINE X 2)     Status: None   Collection Time    09/19/13 11:14 AM      Result Value Ref Range Status   Specimen Description BLOOD LEFT HAND   Final   Special Requests BOTTLES DRAWN AEROBIC ONLY 5CC   Final   Culture  Setup Time  Final   Value: 09/19/2013 16:25     Performed at [facility]   Culture     Final   Value:        BLOOD CULTURE RECEIVED NO GROWTH TO DATE CULTURE WILL BE HELD FOR 5 DAYS BEFORE ISSUING A FINAL NEGATIVE REPORT     Performed at [facility]   Report Status PENDING   Incomplete   Studies/Results: No results found. Medications: medication reviewed Scheduled Meds: . Chlorhexidine Gluconate Cloth  6 each Topical Q0600  . heparin  5,000 Units Subcutaneous 3 times per day  . insulin aspart  0-9 Units Subcutaneous 4 times per day  . mupirocin ointment  1 application Nasal BID  . pantoprazole (PROTONIX) IV  40 mg Intravenous Q12H  . piperacillin-tazobactam (ZOSYN)  IV  3.375 g Intravenous Q8H  . potassium chloride  10 mEq Intravenous Q1 Hr x 3  . sodium chloride  3 mL Intravenous Q12H  . vancomycin  1,000 mg Intravenous Q12H   Continuous Infusions: . sodium chloride 40 mL/hr at 09/21/13 1825  . Marland KitchenTPN (CLINIMIX-E) Adult 60 mL/hr at 09/21/13 1738   And  . fat emulsion 240 mL (09/21/13 1738)  . Marland KitchenTPN (CLINIMIX-E) Adult     And  . fat emulsion     PRN Meds:.LORazepam, morphine injection, ondansetron (ZOFRAN) IV, sodium chloride Assessment/Plan: ESOPHAGEAL PERFORATION:She had EGD to remove food impaction on 5/25. She was noted to  have a mucosal tear. A CT showed air in the mediastinum and soft tissues of the neck consistent with a perforation. Gastrograffin study did not show any leak or extravasation of contrast. CTS following and are planning to manage non-operatively. She is NPO with IVF's and IV antibiotics (Vanco and Zosyn). Had PICC line placed and TNA started 2 days ago. Her follow up CXR doesn't show any expansion of pneumomediastinum and clinically patient was stable ,Dr. Roxan Hockey wants her to stay in patient till she start Po, he is planning to do a swallow study tomorrow , followed by gastro graffin and if everything looks ok, might start liquid diet.continue with conservative management as advised by CT. GI discharged her with the recommendation about GI follow up and that she might get benefit from dilation later on to prevent any future incidences. HYPOKALEMIA: Her K was 3.6 today, improved from 3.1 yesterday,acc. To pharmacy they cannot add K in her TPN, give her 20 mg of kcl IV and repeat electrolyte tomorrow. HTN; Her BP is stable currently with out any meds. Monitor and if it start going up, can add some IV meds. As she is NPO.  HYPERLIPIDEMIA; Hold all her home meds. Currently, can resume after she resumes PO.  MS: looks non progressive, not on any treatment. Can be managed if any symptoms arises.      This is a Careers information officer Note.  The care of the patient was discussed with Dr. Gordy Levan and the assessment and plan formulated with their assistance.  Please see their attached note for official documentation of the daily encounter.   LOS: 4 days   Lorella Nimrod, Med Student 09/22/2013, 11:21 AM

## 2013-09-22 NOTE — Care Management Note (Signed)
    Page 1 of 1   09/26/2013     2:44:54 PM CARE MANAGEMENT NOTE 09/26/2013  Patient:  Pamela Walters, Pamela Walters   Account Number:  [id number]  Date Initiated:  09/22/2013  Documentation initiated by:  Derriana Oser  Subjective/Objective Assessment:   Pt adm on 09/18/13 with esophageal tear.  PTA, pt independent, lives at home with spouse.     Action/Plan:   Will follow for home needs as pt progresses.  ? Need for home TNA   Anticipated DC Date:  09/25/2013   Anticipated DC Plan:  [place]  CM consult      Choice offered to / List presented to:             Status of service:  Completed, signed off Medicare Important Message given?   (If response is "NO", the following Medicare IM given date fields will be blank) Date Medicare IM given:   Date Additional Medicare IM given:    Discharge Disposition:  HOME/SELF CARE  Per UR Regulation:  Reviewed for med. necessity/level of care/duration of stay  If discussed at [place] of Stay Meetings, dates discussed:    Comments:  09/26/13 Ellan Lambert, RN, BSN [phone] Pt discharged home today on soft diet.  No home TNA needed.

## 2013-09-22 NOTE — Progress Notes (Signed)
NUTRITION FOLLOW UP  DOCUMENTATION CODES Per approved criteria  -Obesity Unspecified   INTERVENTION:  TPN per pharmacy RD to follow for nutrition care plan  NUTRITION DIAGNOSIS: Inadequate oral intake related to altered GI function as evidenced by NPO status, ongoing  Goal: Pt to meet >/= 90% of their estimated nutrition needs, met  Monitor:  TPN prescription, PO diet advancement, weight, labs, I/O's  ASSESSMENT: 53 y.o. Female with a PMH of MS who presented with esophageal food impaction.   Patient underwent upper endoscopy 5/25 which revealed an esophageal mucosal tear at the level of the food impaction once a meat bolus was removed from the lower esophagus.    Patient continues to receive TPN via PICC line with Clinimix E 5/15 @ 80 ml/hr and lipids @ 10 ml/hr.  Provides 1843 kcal and 96 grams protein per day.  Meets 100% minimum estimated energy needs and 100% minimum estimated protein needs.  Pt continues with NPO status.  Plan is to repeat swallow Saturday with gastrograffin followed by barium.  Height: Ht Readings from Last 1 Encounters:  09/19/13 '5\' 3"'  (1.6 m)    Weight: Wt Readings from Last 1 Encounters:  09/22/13 176 lb 2.4 oz (79.9 kg)    BMI:  Body mass index is 31.21 kg/(m^2).  Estimated Nutritional Needs: Kcal: 1800-2000 Protein: 90-100 gm Fluid: 1.8-2.0 L  Skin: Intact  Diet Order: NPO   Intake/Output Summary (Last 24 hours) at 09/22/13 1458 Last data filed at 09/22/13 0200  Gross per 24 hour  Intake    250 ml  Output      0 ml  Net    250 ml    Labs:   Recent Labs Lab 09/19/13 1100 09/20/13 0500 09/21/13 0255 09/22/13 0640  NA 143 141 140 141  K 3.8 3.7 3.1* 3.6*  CL 107 102 102 104  CO2 '24 21 24 26  ' BUN '14 11 9 11  ' CREATININE 0.94 0.92 0.79 0.85  CALCIUM 8.8 8.5 8.6 9.1  MG 2.0 1.8 2.0  --   PHOS  --  2.7 2.5  --   GLUCOSE 82 74 101* 113*    CBG (last 3)   Recent Labs  09/21/13 2201 09/22/13 0505 09/22/13 0952   GLUCAP 103* 115* 85    Scheduled Meds: . Chlorhexidine Gluconate Cloth  6 each Topical Q0600  . heparin  5,000 Units Subcutaneous 3 times per day  . insulin aspart  0-9 Units Subcutaneous 4 times per day  . mupirocin ointment  1 application Nasal BID  . pantoprazole (PROTONIX) IV  40 mg Intravenous Q12H  . piperacillin-tazobactam (ZOSYN)  IV  3.375 g Intravenous Q8H  . sodium chloride  3 mL Intravenous Q12H  . vancomycin  1,250 mg Intravenous Q12H    Continuous Infusions: . sodium chloride 40 mL/hr at 09/21/13 1825  . Marland KitchenTPN (CLINIMIX-E) Adult 60 mL/hr at 09/21/13 1738   And  . fat emulsion 240 mL (09/21/13 1738)  . Marland KitchenTPN (CLINIMIX-E) Adult     And  . fat emulsion      Past Medical History  Diagnosis Date  . Hypertension   . Hyperlipidemia   . MS (multiple sclerosis)     2007  . Back pain     Past Surgical History  Procedure Laterality Date  . Abdominal hysterectomy  2005    fibroids  . Esophagogastroduodenoscopy N/A 09/18/2013    Procedure: ESOPHAGOGASTRODUODENOSCOPY (EGD);  Surgeon: Jerene Bears, MD;  Location: [facility];  Service: Gastroenterology;  Laterality: N/A;    Arthur Holms, RD, LDN Pager #: [phone] After-Hours Pager #: [phone]

## 2013-09-22 NOTE — Progress Notes (Signed)
ANTIBIOTIC CONSULT NOTE - Follow-up  Pharmacy Consult for Vancomycin, Zosyn Indication: esophageal rupture  No Known Allergies  Patient Measurements: Height: 5\' 3"  (160 cm) Weight: 176 lb 2.4 oz (79.9 kg) IBW/kg (Calculated) : 52.4  Vital Signs:   Labs:  Recent Labs  09/20/13 0500 09/21/13 0255 09/22/13 0640  WBC 7.6 5.8  --   HGB 10.9* 10.8*  --   PLT 249 251  --   CREATININE 0.92 0.79 0.85   Estimated Creatinine Clearance: 74.8 ml/min (by C-G formula based on Cr of 0.85).  Microbiology: Recent Results (from the past 720 hour(s))  MRSA PCR SCREENING     Status: Abnormal   Collection Time    09/19/13 10:07 AM      Result Value Ref Range Status   MRSA by PCR POSITIVE (*) NEGATIVE Final   Comment:            The GeneXpert MRSA Assay (FDA     approved for NASAL specimens     only), is one component of a     comprehensive MRSA colonization     surveillance program. It is not     intended to diagnose MRSA     infection nor to guide or     monitor treatment for     MRSA infections.     RESULT CALLED TO, READ BACK BY AND VERIFIED WITH:     WHITE RN 11:50 09/19/13 ([email or web address])  CULTURE, BLOOD (ROUTINE X 2)     Status: None   Collection Time    09/19/13 11:00 AM      Result Value Ref Range Status   Specimen Description BLOOD LEFT ANTECUBITAL   Final   Special Requests BOTTLES DRAWN AEROBIC ONLY 3CC   Final   Culture  Setup Time     Final   Value: 09/19/2013 16:25     Performed at [facility]   Culture     Final   Value:        BLOOD CULTURE RECEIVED NO GROWTH TO DATE CULTURE WILL BE HELD FOR 5 DAYS BEFORE ISSUING A FINAL NEGATIVE REPORT     Performed at [facility]   Report Status PENDING   Incomplete  CULTURE, BLOOD (ROUTINE X 2)     Status: None   Collection Time    09/19/13 11:14 AM      Result Value Ref Range Status   Specimen Description BLOOD LEFT HAND   Final   Special Requests BOTTLES DRAWN AEROBIC ONLY 5CC   Final   Culture  Setup Time      Final   Value: 09/19/2013 16:25     Performed at [facility]   Culture     Final   Value:        BLOOD CULTURE RECEIVED NO GROWTH TO DATE CULTURE WILL BE HELD FOR 5 DAYS BEFORE ISSUING A FINAL NEGATIVE REPORT     Performed at [facility]   Report Status PENDING   Incomplete    Assessment: 73 YOF admitted after food impaction >24 hours s/p EGD to remove food impaction found to have a mucosal tear. CT with air consistent with perforation. Pt on empiric abx Day#4 (Vanc and Zosyn) for esophageal perf per CT. Afeb. WBC wnl.   Vancomycin trough 10.3 on 1gm IV q12h. Goal ~15 mcg/ml for empiric therapy  Goal of Therapy:  Vancomycin trough 15-20 mcg/ml  Plan:  Continue Zosyn 3.375g IV q8h-  4 hr infusion.  Change  Vancomycin to 1250mg  IV q12h.  Follow-up renal function, cultures, and clinical status. Vanc trough in a few days at new [facility] if continues ?narrow antibiotics soon   Sherlon Handing, PharmD, BCPS Clinical pharmacist, pager [phone] 09/22/2013,1:22 PM

## 2013-09-23 ENCOUNTER — Inpatient Hospital Stay (HOSPITAL_COMMUNITY): Payer: BC Managed Care – PPO

## 2013-09-23 LAB — BASIC METABOLIC PANEL
BUN: 15 mg/dL (ref 6–23)
CALCIUM: 9.1 mg/dL (ref 8.4–10.5)
CO2: 25 meq/L (ref 19–32)
CREATININE: 0.86 mg/dL (ref 0.50–1.10)
Chloride: 105 mEq/L (ref 96–112)
GFR calc Af Amer: 87 mL/min — ABNORMAL LOW (ref >90)
GFR calc non Af Amer: 75 mL/min — ABNORMAL LOW (ref >90)
GLUCOSE: 101 mg/dL — AB (ref 70–99)
Potassium: 3.8 mEq/L (ref 3.7–5.3)
Sodium: 141 mEq/L (ref 137–147)

## 2013-09-23 LAB — CBC
HCT: 34 % — ABNORMAL LOW (ref 36.0–46.0)
HEMOGLOBIN: 11.5 g/dL — AB (ref 12.0–15.0)
MCH: 27.2 pg (ref 26.0–34.0)
MCHC: 33.8 g/dL (ref 30.0–36.0)
MCV: 80.4 fL (ref 78.0–100.0)
Platelets: 275 10*3/uL (ref 150–400)
RBC: 4.23 MIL/uL (ref 3.87–5.11)
RDW: 15.9 % — ABNORMAL HIGH (ref 11.5–15.5)
WBC: 6.9 10*3/uL (ref 4.0–10.5)

## 2013-09-23 LAB — GLUCOSE, CAPILLARY
Glucose-Capillary: 110 mg/dL — ABNORMAL HIGH (ref 70–99)
Glucose-Capillary: 112 mg/dL — ABNORMAL HIGH (ref 70–99)
Glucose-Capillary: 83 mg/dL (ref 70–99)
Glucose-Capillary: 97 mg/dL (ref 70–99)

## 2013-09-23 MED ORDER — TRACE MINERALS CR-CU-F-FE-I-MN-MO-SE-ZN IV SOLN
INTRAVENOUS | Status: AC
  Administered 2013-09-23: 18:00:00 via INTRAVENOUS
  Filled 2013-09-23: qty 2000

## 2013-09-23 MED ORDER — FAT EMULSION 20 % IV EMUL
250.0000 mL | INTRAVENOUS | Status: AC
  Administered 2013-09-23: 250 mL via INTRAVENOUS
  Filled 2013-09-23: qty 250

## 2013-09-23 MED ORDER — IOHEXOL 300 MG/ML  SOLN
150.0000 mL | Freq: Once | INTRAMUSCULAR | Status: AC | PRN
  Administered 2013-09-23: 150 mL via ORAL

## 2013-09-23 MED ORDER — IOHEXOL 300 MG/ML  SOLN: 150.0000 mL | Freq: Once | INTRAMUSCULAR | Status: AC | PRN

## 2013-09-23 NOTE — Progress Notes (Signed)
5 Days Post-Op Procedure(s) (LRB): ESOPHAGOGASTRODUODENOSCOPY (EGD) (N/A) Subjective: C/o sore throat  Objective: Vital signs in last 24 hours: Temp:  [98.1 F (36.7 C)-98.5 F (36.9 C)] 98.2 F (36.8 C) (05/30 0527) Pulse Rate:  [73-82] 73 (05/30 0527) Cardiac Rhythm:  [-] Normal sinus rhythm (05/30 0745) Resp:  [18-20] 18 (05/30 0527) BP: (106-141)/(49-64) 106/49 mmHg (05/30 0527) SpO2:  [97 %] 97 % (05/30 0527) Weight:  [177 lb (80.287 kg)] 177 lb (80.287 kg) (05/30 0527)  Hemodynamic parameters for last 24 hours:    Intake/Output from previous day: 05/29 0701 - 05/30 0700 In: 250 [IV Piggyback:250] Out: -  Intake/Output this shift:    General appearance: alert and no distress Neurologic: intact Heart: regular rate and rhythm Lungs: clear to auscultation bilaterally  Lab Results:  Recent Labs  09/21/13 0255 09/23/13 1000  WBC 5.8 6.9  HGB 10.8* 11.5*  HCT 31.9* 34.0*  PLT 251 275   BMET:  Recent Labs  09/22/13 0640 09/23/13 0500  NA 141 141  K 3.6* 3.8  CL 104 105  CO2 26 25  GLUCOSE 113* 101*  BUN 11 15  CREATININE 0.85 0.86  CALCIUM 9.1 9.1    PT/INR: No results found for this basename: LABPROT, INR,  in the last 72 hours ABG    Component Value Date/Time   TCO2 25 09/18/2013 2244   CBG (last 3)   Recent Labs  09/22/13 1533 09/23/13 0005 09/23/13 0625  GLUCAP 106* 110* 112*    Assessment/Plan: S/P Procedure(s) (LRB): ESOPHAGOGASTRODUODENOSCOPY (EGD) (N/A) - C/o sore throat- this seems more oropharyngeal rather than related to her esophagus, but we need to be sure so will go ahead with swallow  She is afebrile and doesn't appear toxic  Continue npo, TNA and iv antibiotics     LOS: 5 days    Melrose Nakayama 09/23/2013

## 2013-09-23 NOTE — Progress Notes (Signed)
PARENTERAL NUTRITION CONSULT NOTE - FOLLOW UP  Pharmacy Consult for TPN Indication: Esophageal perforation  No Known Allergies  Patient Measurements: Height: 5\' 3"  (160 cm) Weight: 177 lb (80.287 kg) IBW/kg (Calculated) : 52.4 Adjusted Body Weight:  Usual Weight:   Vital Signs: Temp: 98.2 F (36.8 C) (05/30 0527) Temp src: Oral (05/30 0527) BP: 106/49 mmHg (05/30 0527) Pulse Rate: 73 (05/30 0527) Intake/Output from previous day: 05/29 0701 - 05/30 0700 In: 250 [IV Piggyback:250] Out: -  Intake/Output from this shift:    Labs:  Recent Labs  09/21/13 0255  WBC 5.8  HGB 10.8*  HCT 31.9*  PLT 251     Recent Labs  09/21/13 0255 09/22/13 0640 09/23/13 0500  NA 140 141 141  K 3.1* 3.6* 3.8  CL 102 104 105  CO2 24 26 25   GLUCOSE 101* 113* 101*  BUN 9 11 15   CREATININE 0.79 0.85 0.86  CALCIUM 8.6 9.1 9.1  MG 2.0  --   --   PHOS 2.5  --   --   PROT 6.5  --   --   ALBUMIN 3.2*  --   --   AST 18  --   --   ALT 10  --   --   ALKPHOS 47  --   --   BILITOT 0.4  --   --   TRIG 216*  --   --    Estimated Creatinine Clearance: 74.2 ml/min (by C-G formula based on Cr of 0.86).    Recent Labs  09/22/13 1533 09/23/13 0005 09/23/13 0625  GLUCAP 106* 110* 112*    Medications:  Scheduled:  . Chlorhexidine Gluconate Cloth  6 each Topical Q0600  . heparin  5,000 Units Subcutaneous 3 times per day  . insulin aspart  0-9 Units Subcutaneous 4 times per day  . mupirocin ointment  1 application Nasal BID  . pantoprazole (PROTONIX) IV  40 mg Intravenous Q12H  . piperacillin-tazobactam (ZOSYN)  IV  3.375 g Intravenous Q8H  . sodium chloride  3 mL Intravenous Q12H  . vancomycin  1,250 mg Intravenous Q12H    Insulin Requirements in the past 12 hours:  None  Assessment:  53 yo lady who presented with food impaction >24 hours. She had an EGD to remove food and was noted to have a mucosal tear. Gastrograffin swallow did not show contrast leak. Plan to manage her  non-operatively. PICC line was placed for TPN.   GI: Esophageal perforation. TPN started for nutritional support on 5/27. Noted plan to repeat swallow Saturday (5/30) with gastrograffin followed by barium. Pre-albumin 17.4 (5/27), alb 3.2 Endo: No hx DM.  CBGs/24h: 84-110 Lytes: Lytes WNL, K 3.8 << 3.6 (s/p KCl 10 mEq IV x 3 on 5/29) Renal: SCr 0.86, CrCl~70-80 ml/min Pulm: 97% onn RA Cards: Hx HTN/DL. BP soft-wnl, HR wnl. On no CV meds Hepatobil: LFTs wnl. TG 216 (5/28) Neuro: Hx MS. PTA prozax, tizanidine currently held ID: Vanc/Zosyn D#5 (5/26 >> ) for esophageal perforation.  AFeb, WBC wnl Best Practices: Hep SQ TPN Access: PICC 5/26 TPN day#: 2 (5/28 >> current)  Current Nutrition:  Clinimix E 5/15 at 80 ml/hr and lipids at 68ml/hr provides 1843 kcal and 96 grams protein (100% of goal)  Nutritional Goals:  1800-2000 kcal and 90-100g pro per RD assessment on 5/29  Plan:  - Continue Clinimix-E 5/15 and increase to 83 ml/hr to optimize full use of 2L bag - Continue Lipids at 10 ml/hr - Continue sensitive  SSI - Will f/u swallow eval results and plans for enteral/oral nutrition  Alycia Rossetti, PharmD, BCPS Clinical Pharmacist Pager: [phone] 09/23/2013 10:02 AM

## 2013-09-23 NOTE — Progress Notes (Signed)
Subjective:   Day of hospitalization: 5  VSS.  Pt admitted with pneumomediastinum which appears less prominent on 5/27 CXR.  VSS.  Afebrile, tmax 98.5 yesterday.  She still has some R neck tenderness and c/o sore throat that started last night. No CP, SOB.    Objective:   Vital signs in last 24 hours: Filed Vitals:   09/22/13 0510 09/22/13 1536 09/22/13 1938 09/23/13 0527  BP:  132/61 141/64 106/49  Pulse:  82 75 73  Temp:  98.1 F (36.7 C) 98.5 F (36.9 C) 98.2 F (36.8 C)  TempSrc:  Oral Oral Oral  Resp:  20 18 18   Height:      Weight: 176 lb 2.4 oz (79.9 kg)   177 lb (80.287 kg)  SpO2:  97% 97% 97%    Weight: Filed Weights   09/21/13 0627 09/22/13 0510 09/23/13 0527  Weight: 177 lb 8 oz (80.513 kg) 176 lb 2.4 oz (79.9 kg) 177 lb (80.287 kg)    Ins/Outs:  Intake/Output Summary (Last 24 hours) at 09/23/13 0840 Last data filed at 09/22/13 2300  Gross per 24 hour  Intake    250 ml  Output      0 ml  Net    250 ml   +285ml yesterday  Physical Exam: Constitutional: Vital signs reviewed.  Patient is lying in bed sleeping.  Cooperative with exam.   HEENT: [place]/AT; PERRL, EOMI, conjunctivae normal, no scleral icterus  Neck: mild R sided tenderness Cardiovascular: RRR, no MRG Pulmonary/Chest: normal respiratory effort, no accessory muscle use, CTAB, no wheezes, rales, or rhonchi Abdominal: Soft. +BS, NT/ND Neurological: A&O x3, CN II-XII grossly intact; non-focal exam Extremities: 2+DP b/l, no C/C/E  Skin: Warm, dry and intact. No rash  Lab Results:  BMP:  Recent Labs Lab 09/20/13 0500 09/21/13 0255 09/22/13 0640 09/23/13 0500  NA 141 140 141 141  K 3.7 3.1* 3.6* 3.8  CL 102 102 104 105  CO2 21 24 26 25   GLUCOSE 74 101* 113* 101*  BUN 11 9 11 15   CREATININE 0.92 0.79 0.85 0.86  CALCIUM 8.5 8.6 9.1 9.1  MG 1.8 2.0  --   --   PHOS 2.7 2.5  --   --    Anion Gap:  11  CBC:  Recent Labs Lab 09/18/13 2240  09/20/13 0500 09/21/13 0255  WBC 11.0*   --  7.6 5.8  NEUTROABS 8.2*  --  5.5  --   HGB 12.2  < > 10.9* 10.8*  HCT 36.5  < > 33.0* 31.9*  MCV 80.4  --  81.5 79.9  PLT 320  --  249 251  < > = values in this interval not displayed.  Coagulation:  Recent Labs Lab 09/19/13 1100  LABPROT 14.0  INR 1.10    CBG:          Recent Labs Lab 09/21/13 2201 09/22/13 0505 09/22/13 0952 09/22/13 1533 09/23/13 0005 09/23/13 0625  GLUCAP 103* 115* 85 106* 110* 112*           HA1C:      No results found for this basename: HGBA1C,  in the last 168 hours  Lipid Panel:  Recent Labs Lab 09/21/13 0255  TRIG 216*    LFTs:  Recent Labs Lab 09/20/13 0500 09/21/13 0255  AST 20 18  ALT 12 10  ALKPHOS 52 47  BILITOT 0.7 0.4  PROT 6.4 6.5  ALBUMIN 3.3* 3.2*    Pancreatic Enzymes: No results found for  this basename: LIPASE, AMYLASE,  in the last 168 hours  Lactic Acid/Procalcitonin:  Recent Labs Lab 09/19/13 1100  LATICACIDVEN 1.0    Ammonia: No results found for this basename: AMMONIA,  in the last 168 hours  Cardiac Enzymes:  Recent Labs Lab 09/19/13 1100 09/19/13 1725 09/19/13 2145  TROPONINI <0.30 <0.30 <0.30    EKG: EKG Interpretation  Date/Time:    Ventricular Rate:    PR Interval:    QRS Duration:   QT Interval:    QTC Calculation:   R Axis:     Text Interpretation:     BNP: No results found for this basename: PROBNP,  in the last 168 hours  D-Dimer: No results found for this basename: DDIMER,  in the last 168 hours  Urinalysis: No results found for this basename: COLORURINE, APPERANCEUR, LABSPEC, PHURINE, GLUCOSEU, HGBUR, BILIRUBINUR, KETONESUR, PROTEINUR, UROBILINOGEN, NITRITE, LEUKOCYTESUR,  in the last 168 hours  Micro Results: Recent Results (from the past 240 hour(s))  MRSA PCR SCREENING     Status: Abnormal   Collection Time    09/19/13 10:07 AM      Result Value Ref Range Status   MRSA by PCR POSITIVE (*) NEGATIVE Final   Comment:            The GeneXpert MRSA  Assay (FDA     approved for NASAL specimens     only), is one component of a     comprehensive MRSA colonization     surveillance program. It is not     intended to diagnose MRSA     infection nor to guide or     monitor treatment for     MRSA infections.     RESULT CALLED TO, READ BACK BY AND VERIFIED WITH:     WHITE RN 11:50 09/19/13 ([email or web address])  CULTURE, BLOOD (ROUTINE X 2)     Status: None   Collection Time    09/19/13 11:00 AM      Result Value Ref Range Status   Specimen Description BLOOD LEFT ANTECUBITAL   Final   Special Requests BOTTLES DRAWN AEROBIC ONLY 3CC   Final   Culture  Setup Time     Final   Value: 09/19/2013 16:25     Performed at [facility]   Culture     Final   Value:        BLOOD CULTURE RECEIVED NO GROWTH TO DATE CULTURE WILL BE HELD FOR 5 DAYS BEFORE ISSUING A FINAL NEGATIVE REPORT     Performed at [facility]   Report Status PENDING   Incomplete  CULTURE, BLOOD (ROUTINE X 2)     Status: None   Collection Time    09/19/13 11:14 AM      Result Value Ref Range Status   Specimen Description BLOOD LEFT HAND   Final   Special Requests BOTTLES DRAWN AEROBIC ONLY 5CC   Final   Culture  Setup Time     Final   Value: 09/19/2013 16:25     Performed at [facility]   Culture     Final   Value:        BLOOD CULTURE RECEIVED NO GROWTH TO DATE CULTURE WILL BE HELD FOR 5 DAYS BEFORE ISSUING A FINAL NEGATIVE REPORT     Performed at [facility]   Report Status PENDING   Incomplete    Blood Culture:    Component Value Date/Time   SDES BLOOD LEFT HAND 09/19/2013 1114  Studies/Results: No results found.  Medications:  Scheduled Meds: . Chlorhexidine Gluconate Cloth  6 each Topical Q0600  . heparin  5,000 Units Subcutaneous 3 times per day  . insulin aspart  0-9 Units Subcutaneous 4 times per day  . mupirocin ointment  1 application Nasal BID  . pantoprazole (PROTONIX) IV  40 mg Intravenous Q12H  . piperacillin-tazobactam  (ZOSYN)  IV  3.375 g Intravenous Q8H  . sodium chloride  3 mL Intravenous Q12H  . vancomycin  1,250 mg Intravenous Q12H   Continuous Infusions: . sodium chloride 10 mL/hr at 09/22/13 0813  . Marland KitchenTPN (CLINIMIX-E) Adult 80 mL/hr at 09/22/13 1930   And  . fat emulsion 10 kcal (09/22/13 1930)   PRN Meds: LORazepam, morphine injection, ondansetron (ZOFRAN) IV, sodium chloride  Antibiotics: Antibiotics Given (last 72 hours)   Date/Time Action Medication Dose Rate   09/20/13 0945 Given   vancomycin (VANCOCIN) IVPB 1000 mg/200 mL premix 1,000 mg 200 mL/hr   09/20/13 1047 Given   piperacillin-tazobactam (ZOSYN) IVPB 3.375 g 3.375 g 12.5 mL/hr   09/20/13 1809 Given   piperacillin-tazobactam (ZOSYN) IVPB 3.375 g 3.375 g 12.5 mL/hr   09/20/13 2210 Given   vancomycin (VANCOCIN) IVPB 1000 mg/200 mL premix 1,000 mg 200 mL/hr   09/21/13 0235 Given   piperacillin-tazobactam (ZOSYN) IVPB 3.375 g 3.375 g 12.5 mL/hr   09/21/13 1051 Given   piperacillin-tazobactam (ZOSYN) IVPB 3.375 g 3.375 g 12.5 mL/hr   09/21/13 1051 Given   vancomycin (VANCOCIN) IVPB 1000 mg/200 mL premix 1,000 mg 200 mL/hr   09/21/13 1825 Given   piperacillin-tazobactam (ZOSYN) IVPB 3.375 g 3.375 g 12.5 mL/hr   09/21/13 2211 Given   vancomycin (VANCOCIN) IVPB 1000 mg/200 mL premix 1,000 mg 200 mL/hr   09/22/13 0200 Given   piperacillin-tazobactam (ZOSYN) IVPB 3.375 g 3.375 g 12.5 mL/hr   09/22/13 1200 Given   piperacillin-tazobactam (ZOSYN) IVPB 3.375 g 3.375 g 12.5 mL/hr   09/22/13 2053 Given   vancomycin (VANCOCIN) 1,250 mg in sodium chloride 0.9 % 250 mL IVPB 1,250 mg 166.7 mL/hr   09/23/13 0147 Given   piperacillin-tazobactam (ZOSYN) IVPB 3.375 g 3.375 g 12.5 mL/hr      Day of Hospitalization: 5  Consults: Treatment Team:  Melrose Nakayama, MD  Assessment/Plan:   Principal Problem:   Esophageal obstruction due to food impaction Active Problems:   HYPERLIPIDEMIA   Obesity, unspecified   DEPRESSION    MULTIPLE SCLEROSIS, PROGRESSIVE/RELAPSING   HYPERTENSION   Chest pain   Esophageal rupture   Hypokalemia  Esophageal perforation complicated by pneumomediastinum  Improving.  Mild R neck tenderness, no CP, SOB.  Pt p/w with esophageal food impaction and upper endoscopy with esophageal mucosa tear underneath.  Later developed CP/R neck pain. CVTS consulted and medical management.  Repeat CXR shows pneumomediastinum less prominent with PICC line in place.   -continue strict NPO>>avoid all oral intake  -repeat CXR today -BCx pending-->NGTD (zosyn given in ED prior to BCx)  -continue vancomycin/zosyn  -Intravenous PPI BID  -continue TNA -will need speech evaluation  -will need GI follow-up for possible dilation  -morphine prn pain, zofran prn nausea -additional management per CVTS  Hypertension Stable.  On home maxzide and pravachol -hold home meds   Dyslipidemia -hold all oral meds   Depression -on home prozac; hold for now -ativan IV prn sleep  h/o MS -Not on home meds  FEN -continue TPN  VTE PPx  5000 Units Heparin SQ tid  Disposition Disposition is deferred,  awaiting improvement of current medical problems.  Anticipated discharge in approximately 1-2 day(s).     LOS: 5 days   Jones Bales, MD PGY-1, Internal Medicine Teaching Service [phone] (7AM-5PM Mon-Fri) 09/23/2013, 8:40 AM

## 2013-09-24 LAB — BASIC METABOLIC PANEL
BUN: 16 mg/dL (ref 6–23)
CO2: 26 mEq/L (ref 19–32)
Calcium: 8.8 mg/dL (ref 8.4–10.5)
Chloride: 102 mEq/L (ref 96–112)
Creatinine, Ser: 0.87 mg/dL (ref 0.50–1.10)
GFR calc Af Amer: 85 mL/min — ABNORMAL LOW (ref >90)
GFR, EST NON AFRICAN AMERICAN: 74 mL/min — AB (ref >90)
GLUCOSE: 106 mg/dL — AB (ref 70–99)
Potassium: 3.7 mEq/L (ref 3.7–5.3)
SODIUM: 138 meq/L (ref 137–147)

## 2013-09-24 LAB — GLUCOSE, CAPILLARY
Glucose-Capillary: 118 mg/dL — ABNORMAL HIGH (ref 70–99)
Glucose-Capillary: 119 mg/dL — ABNORMAL HIGH (ref 70–99)
Glucose-Capillary: 119 mg/dL — ABNORMAL HIGH (ref 70–99)
Glucose-Capillary: 128 mg/dL — ABNORMAL HIGH (ref 70–99)
Glucose-Capillary: 132 mg/dL — ABNORMAL HIGH (ref 70–99)

## 2013-09-24 MED ORDER — FLUCONAZOLE 100 MG PO TABS
100.0000 mg | ORAL_TABLET | Freq: Every day | ORAL | Status: DC
  Administered 2013-09-24 – 2013-09-26 (×3): 100 mg via ORAL
  Filled 2013-09-24 (×3): qty 1

## 2013-09-24 MED ORDER — POTASSIUM CHLORIDE 10 MEQ/50ML IV SOLN
10.0000 meq | INTRAVENOUS | Status: AC
  Administered 2013-09-24 (×2): 10 meq via INTRAVENOUS
  Filled 2013-09-24 (×2): qty 50

## 2013-09-24 MED ORDER — BIOTENE DRY MOUTH MT LIQD: 15.0000 mL | OROMUCOSAL | Status: DC | PRN

## 2013-09-24 MED ORDER — MAGIC MOUTHWASH
5.0000 mL | Freq: Three times a day (TID) | ORAL | Status: DC | PRN
  Filled 2013-09-24: qty 5

## 2013-09-24 MED ORDER — PHENOL 1.4 % MT LIQD
1.0000 | OROMUCOSAL | Status: DC | PRN
  Filled 2013-09-24: qty 177

## 2013-09-24 MED ORDER — TRACE MINERALS CR-CU-F-FE-I-MN-MO-SE-ZN IV SOLN
INTRAVENOUS | Status: DC
  Administered 2013-09-24: 18:00:00 via INTRAVENOUS
  Filled 2013-09-24: qty 2000

## 2013-09-24 MED ORDER — FAT EMULSION 20 % IV EMUL
250.0000 mL | INTRAVENOUS | Status: DC
  Administered 2013-09-24: 250 mL via INTRAVENOUS
  Filled 2013-09-24: qty 250

## 2013-09-24 NOTE — Progress Notes (Signed)
PARENTERAL NUTRITION CONSULT NOTE - FOLLOW UP  Pharmacy Consult for TPN Indication: Esophageal perforation  No Known Allergies  Patient Measurements: Height: 5\' 3"  (160 cm) Weight: 176 lb 14.4 oz (80.241 kg) IBW/kg (Calculated) : 52.4 Adjusted Body Weight:  Usual Weight:   Vital Signs: Temp: 98 F (36.7 C) (05/31 0500) Temp src: Oral (05/31 0500) BP: 133/70 mmHg (05/31 0500) Pulse Rate: 86 (05/31 0500) Intake/Output from previous day:   Intake/Output from this shift:    Labs:  Recent Labs  09/23/13 1000  WBC 6.9  HGB 11.5*  HCT 34.0*  PLT 275     Recent Labs  09/22/13 0640 09/23/13 0500 09/24/13 0531  NA 141 141 138  K 3.6* 3.8 3.7  CL 104 105 102  CO2 26 25 26   GLUCOSE 113* 101* 106*  BUN 11 15 16   CREATININE 0.85 0.86 0.87  CALCIUM 9.1 9.1 8.8   Estimated Creatinine Clearance: 73.2 ml/min (by C-G formula based on Cr of 0.87).    Recent Labs  09/23/13 1849 09/24/13 0033 09/24/13 0516  GLUCAP 97 119* 118*    Medications:  Scheduled:  . Chlorhexidine Gluconate Cloth  6 each Topical Q0600  . heparin  5,000 Units Subcutaneous 3 times per day  . insulin aspart  0-9 Units Subcutaneous 4 times per day  . pantoprazole (PROTONIX) IV  40 mg Intravenous Q12H  . piperacillin-tazobactam (ZOSYN)  IV  3.375 g Intravenous Q8H  . sodium chloride  3 mL Intravenous Q12H  . vancomycin  1,250 mg Intravenous Q12H    Insulin Requirements in the past 12 hours:  None  Assessment:  53 yo lady who presented with food impaction >24 hours. She had an EGD to remove food and was noted to have a mucosal tear. Gastrograffin swallow did not show contrast leak. Plan to manage her non-operatively. PICC line was placed for TPN.   GI: Esophageal perforation. TPN started for nutritional support on 5/27. Noted plan to repeat swallow with gastrograffin followed by barium - not done yet.. Pre-albumin 17.4 (5/27), alb 3.2 Endo: No hx DM.  CBGs/24h: 83-119 Lytes: Lytes WNL, K  3.7  Renal: SCr 0.87, CrCl~70-80 ml/min Pulm: 97% on RA Cards: Hx HTN/DL. BP/HR wnl. On no CV meds Hepatobil: LFTs wnl. TG 216 (5/28) Neuro: Hx MS. PTA prozac, tizanidine currently held ID: Vanc/Zosyn D#6 (5/26 >> ) for esophageal perforation.  Afeb, WBC wnl Best Practices: Hep SQ TPN Access: PICC 5/26 TPN day#: 2 (5/28 >> current)  Current Nutrition:  Clinimix E 5/15 at 83 ml/hr and lipids at 34ml/hr provides 1843 kcal and 96 grams protein (100% of goal)  Nutritional Goals:  1800-2000 kcal and 90-100g pro per RD assessment on 5/29  Plan:  - Continue Clinimix-E 5/15 at 83 ml/hr - Continue Lipids at 10 ml/hr - Add MV and TE daily - KCl 10 mEq IV x 2 runs - Continue sensitive SSI - will consider stopping if CBGs remain stable - Will f/u swallow eval results and plans for enteral/oral nutrition  Alycia Rossetti, PharmD, BCPS Clinical Pharmacist Pager: [phone] 09/24/2013 7:48 AM

## 2013-09-24 NOTE — Progress Notes (Addendum)
      [address]       [place] [address]             [phone]       6 Days Post-Op Procedure(s) (LRB): ESOPHAGOGASTRODUODENOSCOPY (EGD) (N/A)  Subjective: Patient vomited once after liquids last evening. She thinks she took it in too fast. Has no nausea, abdominal pain, or emesis this am. Only other complaint is sore throat.  Objective: Vital signs in last 24 hours: Temp:  [98 F (36.7 C)-99.5 F (37.5 C)] 98 F (36.7 C) (05/31 0500) Pulse Rate:  [76-86] 86 (05/31 0500) Cardiac Rhythm:  [-]  Resp:  [16-18] 16 (05/31 0500) BP: (124-133)/(60-70) 133/70 mmHg (05/31 0500) SpO2:  [97 %-100 %] 97 % (05/31 0500) Weight:  [176 lb 14.4 oz (80.241 kg)] 176 lb 14.4 oz (80.241 kg) (05/31 0500)      Physical Exam:  Cardiovascular: RRR. Pulmonary: Clear to auscultation bilaterally; no rales, wheezes, or rhonchi. Abdomen: Soft, non tender, bowel sounds present.     Lab Results: CBC: Recent Labs  09/23/13 1000  WBC 6.9  HGB 11.5*  HCT 34.0*  PLT 275   BMET:  Recent Labs  09/23/13 0500 09/24/13 0531  NA 141 138  K 3.8 3.7  CL 105 102  CO2 25 26  GLUCOSE 101* 106*  BUN 15 16  CREATININE 0.86 0.87  CALCIUM 9.1 8.8    PT/INR: No results found for this basename: LABPROT, INR,  in the last 72 hours ABG:  INR: Will add last result for INR, ABG once components are confirmed Will add last 4 CBG results once components are confirmed  Assessment/Plan:  1. CV - SR 2.  GI-tolerating sips of clear liquids. Continue for this am. Advance to full liquids at lunch. Will stop TNA soon.  3.ID-continue with Vanco and Zosyn for now 4. Regarding sore throat, no evidence of thrush. Try biotene as has dry throat complaint   Donielle M ZimmermanPA-C 09/24/2013,7:54 AM  Still with sore throat, no chest or neck pain- I suspect this is thrush. Will start diflucan/ magic mouthwash Continue IV antibiotics through Monday Will try full liquids this evening

## 2013-09-25 ENCOUNTER — Telehealth: Payer: Self-pay

## 2013-09-25 LAB — CULTURE, BLOOD (ROUTINE X 2)
CULTURE: NO GROWTH
Culture: NO GROWTH

## 2013-09-25 LAB — COMPREHENSIVE METABOLIC PANEL
ALT: 19 U/L (ref 0–35)
AST: 19 U/L (ref 0–37)
Albumin: 3.2 g/dL — ABNORMAL LOW (ref 3.5–5.2)
Alkaline Phosphatase: 57 U/L (ref 39–117)
BILIRUBIN TOTAL: 0.2 mg/dL — AB (ref 0.3–1.2)
BUN: 13 mg/dL (ref 6–23)
CALCIUM: 9 mg/dL (ref 8.4–10.5)
CHLORIDE: 103 meq/L (ref 96–112)
CO2: 25 meq/L (ref 19–32)
Creatinine, Ser: 0.83 mg/dL (ref 0.50–1.10)
GFR, EST NON AFRICAN AMERICAN: 78 mL/min — AB (ref >90)
GLUCOSE: 128 mg/dL — AB (ref 70–99)
Potassium: 3.9 mEq/L (ref 3.7–5.3)
Sodium: 140 mEq/L (ref 137–147)
Total Protein: 6.8 g/dL (ref 6.0–8.3)

## 2013-09-25 LAB — CBC
HCT: 34.6 % — ABNORMAL LOW (ref 36.0–46.0)
HEMOGLOBIN: 11.2 g/dL — AB (ref 12.0–15.0)
MCH: 26.4 pg (ref 26.0–34.0)
MCHC: 32.4 g/dL (ref 30.0–36.0)
MCV: 81.6 fL (ref 78.0–100.0)
Platelets: 287 10*3/uL (ref 150–400)
RBC: 4.24 MIL/uL (ref 3.87–5.11)
RDW: 16.4 % — ABNORMAL HIGH (ref 11.5–15.5)
WBC: 8.9 10*3/uL (ref 4.0–10.5)

## 2013-09-25 LAB — GLUCOSE, CAPILLARY
GLUCOSE-CAPILLARY: 133 mg/dL — AB (ref 70–99)
GLUCOSE-CAPILLARY: 140 mg/dL — AB (ref 70–99)
GLUCOSE-CAPILLARY: 159 mg/dL — AB (ref 70–99)

## 2013-09-25 LAB — PREALBUMIN: Prealbumin: 18.5 mg/dL (ref 17.0–34.0)

## 2013-09-25 LAB — PHOSPHORUS: PHOSPHORUS: 3.3 mg/dL (ref 2.3–4.6)

## 2013-09-25 LAB — MAGNESIUM: MAGNESIUM: 2.1 mg/dL (ref 1.5–2.5)

## 2013-09-25 LAB — TRIGLYCERIDES: Triglycerides: 239 mg/dL — ABNORMAL HIGH (ref <150)

## 2013-09-25 MED ORDER — PANTOPRAZOLE SODIUM 40 MG PO TBEC
40.0000 mg | DELAYED_RELEASE_TABLET | Freq: Two times a day (BID) | ORAL | Status: DC
  Administered 2013-09-25 – 2013-09-26 (×3): 40 mg via ORAL
  Filled 2013-09-25 (×3): qty 1

## 2013-09-25 MED ORDER — LORATADINE 10 MG PO TABS
10.0000 mg | ORAL_TABLET | Freq: Every day | ORAL | Status: DC
  Administered 2013-09-25 – 2013-09-26 (×2): 10 mg via ORAL
  Filled 2013-09-25 (×2): qty 1

## 2013-09-25 MED ORDER — FAT EMULSION 20 % IV EMUL
250.0000 mL | INTRAVENOUS | Status: DC
  Filled 2013-09-25: qty 250

## 2013-09-25 MED ORDER — TRACE MINERALS CR-CU-F-FE-I-MN-MO-SE-ZN IV SOLN
INTRAVENOUS | Status: DC
  Filled 2013-09-25: qty 2000

## 2013-09-25 MED ORDER — ADULT MULTIVITAMIN W/MINERALS CH
1.0000 | ORAL_TABLET | Freq: Every day | ORAL | Status: DC
  Administered 2013-09-26: 1 via ORAL
  Filled 2013-09-25: qty 1

## 2013-09-25 MED ORDER — CLINIMIX E/DEXTROSE (5/15) 5 % IV SOLN
INTRAVENOUS | Status: DC
Start: 2013-09-25 — End: 2013-09-25
  Filled 2013-09-25 (×2): qty 2000

## 2013-09-25 NOTE — Progress Notes (Signed)
Subjective: Patient c/o itchy eyes, post nasal drip, frontal sinus congestion.  Reports she takes allegra for allergies at home.  Reports this feels similar to when she stops taking allegra. She is currently tolerating a clear liquid diet. Objective: Vital signs in last 24 hours: Filed Vitals:   09/24/13 1518 09/24/13 2300 09/25/13 0503 09/25/13 1429  BP: 109/73 141/53 144/75 124/76  Pulse: 92 79 76 93  Temp: 98.5 F (36.9 C) 99 F (37.2 C) 98.4 F (36.9 C) 99 F (37.2 C)  TempSrc: Oral Oral Oral Oral  Resp: 17  17 18   Height:      Weight:   179 lb (81.194 kg)   SpO2: 97% 98% 96% 97%   Weight change: 2 lb 1.6 oz (0.953 kg)  Intake/Output Summary (Last 24 hours) at 09/25/13 1431 Last data filed at 09/25/13 1429  Gross per 24 hour  Intake   1080 ml  Output      0 ml  Net   1080 ml   General: resting in bed HEENT: inferior turbinate hypertrophy, post nasal drip, no evidence of thrush Cardiac: RRR, no murmurs Pulm: CTAB Abd: soft, nontender, nondistended, BS present Ext: warm and well perfused, no pedal edema Neuro: non focal Lab Results: Basic Metabolic Panel:  Recent Labs Lab 09/21/13 0255  09/24/13 0531 09/25/13 0430  NA 140  < > 138 140  K 3.1*  < > 3.7 3.9  CL 102  < > 102 103  CO2 24  < > 26 25  GLUCOSE 101*  < > 106* 128*  BUN 9  < > 16 13  CREATININE 0.79  < > 0.87 0.83  CALCIUM 8.6  < > 8.8 9.0  MG 2.0  --   --  2.1  PHOS 2.5  --   --  3.3  < > = values in this interval not displayed. Liver Function Tests:  Recent Labs Lab 09/21/13 0255 09/25/13 0430  AST 18 19  ALT 10 19  ALKPHOS 47 57  BILITOT 0.4 0.2*  PROT 6.5 6.8  ALBUMIN 3.2* 3.2*   CBC:  Recent Labs Lab 09/18/13 2240  09/20/13 0500  09/23/13 1000 09/25/13 0430  WBC 11.0*  --  7.6  < > 6.9 8.9  NEUTROABS 8.2*  --  5.5  --   --   --   HGB 12.2  < > 10.9*  < > 11.5* 11.2*  HCT 36.5  < > 33.0*  < > 34.0* 34.6*  MCV 80.4  --  81.5  < > 80.4 81.6  PLT 320  --  249  < > 275 287   < > = values in this interval not displayed. Cardiac Enzymes:  Recent Labs Lab 09/19/13 1100 09/19/13 1725 09/19/13 2145  TROPONINI <0.30 <0.30 <0.30   CBG:  Recent Labs Lab 09/24/13 0516 09/24/13 1135 09/24/13 1730 09/24/13 2257 09/25/13 0616 09/25/13 1154  GLUCAP 118* 119* 132* 128* 133* 140*   Fasting Lipid Panel:  Recent Labs Lab 09/25/13 0430  TRIG 239*   Coagulation:  Recent Labs Lab 09/19/13 1100  LABPROT 14.0  INR 1.10     Micro Results: Recent Results (from the past 240 hour(s))  MRSA PCR SCREENING     Status: Abnormal   Collection Time    09/19/13 10:07 AM      Result Value Ref Range Status   MRSA by PCR POSITIVE (*) NEGATIVE Final   Comment:  The GeneXpert MRSA Assay (FDA     approved for NASAL specimens     only), is one component of a     comprehensive MRSA colonization     surveillance program. It is not     intended to diagnose MRSA     infection nor to guide or     monitor treatment for     MRSA infections.     RESULT CALLED TO, READ BACK BY AND VERIFIED WITH:     WHITE RN 11:50 09/19/13 ([email or web address])  CULTURE, BLOOD (ROUTINE X 2)     Status: None   Collection Time    09/19/13 11:00 AM      Result Value Ref Range Status   Specimen Description BLOOD LEFT ANTECUBITAL   Final   Special Requests BOTTLES DRAWN AEROBIC ONLY 3CC   Final   Culture  Setup Time     Final   Value: 09/19/2013 16:25     Performed at [facility]   Culture     Final   Value: NO GROWTH 5 DAYS     Performed at [facility]   Report Status 09/25/2013 FINAL   Final  CULTURE, BLOOD (ROUTINE X 2)     Status: None   Collection Time    09/19/13 11:14 AM      Result Value Ref Range Status   Specimen Description BLOOD LEFT HAND   Final   Special Requests BOTTLES DRAWN AEROBIC ONLY 5CC   Final   Culture  Setup Time     Final   Value: 09/19/2013 16:25     Performed at [facility]   Culture     Final   Value: NO GROWTH 5 DAYS      Performed at [facility]   Report Status 09/25/2013 FINAL   Final   Studies/Results: Dg Chest 2 View  09/23/2013   CLINICAL DATA:  Sagittal tear, dysphagia, cough  EXAM: CHEST  2 VIEW  COMPARISON:  09/20/2013  FINDINGS: Right-sided PICC line with the tip projecting over the SVC. There is no focal parenchymal opacity, pleural effusion, or pneumothorax. The heart and mediastinal contours are unremarkable.  There is mild thoracic spine spondylosis.  IMPRESSION: No active cardiopulmonary disease.   Electronically Signed   By: Kathreen Devoid   On: 09/23/2013 16:51   Medications: I have reviewed the patient's current medications. Scheduled Meds: . fluconazole  100 mg Oral Daily  . heparin  5,000 Units Subcutaneous 3 times per day  . loratadine  10 mg Oral Daily  . [START ON 09/26/2013] multivitamin with minerals  1 tablet Oral Daily  . pantoprazole  40 mg Oral BID  . sodium chloride  3 mL Intravenous Q12H   Continuous Infusions: . sodium chloride 10 mL/hr at 09/22/13 0813  . Marland KitchenTPN (CLINIMIX-E) Adult     PRN Meds:.antiseptic oral rinse, LORazepam, magic mouthwash, morphine injection, ondansetron (ZOFRAN) IV, phenol, sodium chloride Assessment/Plan:    Esophageal rupture due to Esophageal obstruction due to food impaction Diagnosed by EGD 7 days ago.  Patient started on clear liquid diet last night, is tolerating well. CVTS is following.  Patient completed 7 days of antibiotics today. - D/C TPN if OK with CVTS - Advance diet to soft. If tolerates will D/C home tomorrow. - Will need to follow up with GI- Dr Hilarie Fredrickson as outpatient.    HYPERLIPIDEMIA -Restart pravastatin on discharge    DEPRESSION - Restart fluoxetine on discharge    History of  MULTIPLE SCLEROSIS, PROGRESSIVE/RELAPSING - Not on any medication currently    HYPERTENSION -Currently normotensive, if hypertensive can restart home medication.    Hypokalemia -Resolved  Season allergies - Allegra not on hospital formulary,  will start loratadine 10mg  daily  Dispo: Disposition is deferred at this time, awaiting improvement of current medical problems.  Anticipated discharge in approximately 1 day(s).   The patient does have a current PCP Fayrene Helper, MD) and does not need an [facility] hospital follow-up appointment after discharge.  The patient does not have transportation limitations that hinder transportation to clinic appointments.  .Services Needed at time of discharge: Y = Yes, Blank = No PT:   OT:   RN:   Equipment:   Other:     LOS: 7 days   Joni Reining, DO 09/25/2013, 2:31 PM

## 2013-09-25 NOTE — Progress Notes (Addendum)
[name]       [place] [address]             [phone]      7 Days Post-Op Procedure(s) (LRB): ESOPHAGOGASTRODUODENOSCOPY (EGD) (N/A) Subjective: Only current complaint is sinus congestion and sore throat, which she feels is connected  Objective: Vital signs in last 24 hours: Temp:  [98.4 F (36.9 C)-99 F (37.2 C)] 98.4 F (36.9 C) (06/01 0503) Pulse Rate:  [76-92] 76 (06/01 0503) Cardiac Rhythm:  [-]  Resp:  [17] 17 (06/01 0503) BP: (109-144)/(53-75) 144/75 mmHg (06/01 0503) SpO2:  [96 %-98 %] 96 % (06/01 0503) Weight:  [179 lb (81.194 kg)] 179 lb (81.194 kg) (06/01 0503)  Hemodynamic parameters for last 24 hours:    Intake/Output from previous day: 05/31 0701 - 06/01 0700 In: 720 [P.O.:720] Out: -  Intake/Output this shift:    General appearance: alert, cooperative and no distress Heart: regular rate and rhythm Lungs: clear to auscultation bilaterally Abdomen: soft, non-tender; bowel sounds normal; no masses,  no organomegaly  Lab Results:  Recent Labs  09/23/13 1000 09/25/13 0430  WBC 6.9 8.9  HGB 11.5* 11.2*  HCT 34.0* 34.6*  PLT 275 287   BMET:  Recent Labs  09/24/13 0531 09/25/13 0430  NA 138 140  K 3.7 3.9  CL 102 103  CO2 26 25  GLUCOSE 106* 128*  BUN 16 13  CREATININE 0.87 0.83  CALCIUM 8.8 9.0    PT/INR: No results found for this basename: LABPROT, INR,  in the last 72 hours ABG    Component Value Date/Time   TCO2 25 09/18/2013 2244   CBG (last 3)   Recent Labs  09/24/13 1730 09/24/13 2257 09/25/13 0616  GLUCAP 132* 128* 133*   Scheduled Meds: . Chlorhexidine Gluconate Cloth  6 each Topical Q0600  . fluconazole  100 mg Oral Daily  . heparin  5,000 Units Subcutaneous 3 times per day  . insulin aspart  0-9 Units Subcutaneous 4 times per day  . pantoprazole (PROTONIX) IV  40 mg Intravenous Q12H  . piperacillin-tazobactam (ZOSYN)  IV  3.375 g Intravenous Q8H  . sodium chloride  3 mL Intravenous  Q12H  . vancomycin  1,250 mg Intravenous Q12H   Continuous Infusions: . sodium chloride 10 mL/hr at 09/22/13 0813  . Marland KitchenTPN (CLINIMIX-E) Adult 83 mL/hr at 09/24/13 1733   And  . fat emulsion 250 mL (09/24/13 1734)   PRN Meds:.antiseptic oral rinse, LORazepam, magic mouthwash, morphine injection, ondansetron (ZOFRAN) IV, phenol, sodium chloride  Dg Chest 2 View  09/23/2013   CLINICAL DATA:  Sagittal tear, dysphagia, cough  EXAM: CHEST  2 VIEW  COMPARISON:  09/20/2013  FINDINGS: Right-sided PICC line with the tip projecting over the SVC. There is no focal parenchymal opacity, pleural effusion, or pneumothorax. The heart and mediastinal contours are unremarkable.  There is mild thoracic spine spondylosis.  IMPRESSION: No active cardiopulmonary disease.   Electronically Signed   By: Kathreen Devoid   On: 09/23/2013 16:51   Dg Esophagus W/water Sol Cm  09/23/2013   CLINICAL DATA:  Follow-up esophageal perforation  EXAM: ESOPHOGRAM/BARIUM SWALLOW  TECHNIQUE: Combined double contrast and single contrast examination performed using water-soluble contrast followed by thick barium.  FLUOROSCOPY TIME:  1 min 5 seconds  COMPARISON:  09/19/2013  FINDINGS: Initial evaluation was performed with water-soluble contrast demonstrating no extravasation of contrast from the esophagus.  Subsequently barium was administered by mouth. Examination was performed  in the upright position. There is no extravasation of contrast from the distal esophagus in the region of endoscopically noted mucosal tear. There is mild mucosal irregularity just above Schatzki's ring likely corresponding to the endoscopic findings. There is no obstruction. Contrast empties from the esophagus into the stomach without delay. There is a Schatzki's ring present.  IMPRESSION: Small area of mucosal irregularity of the distal esophagus corresponding to the endoscopic findings. No esophageal leak.   Electronically Signed   By: Kathreen Devoid   On: 09/23/2013 13:54    Assessment/Plan: S/P Procedure(s) (LRB): ESOPHAGOGASTRODUODENOSCOPY (EGD) (N/A)  1 tolerating full liquid diet , cont for now 2 defer to medical service to rx "alergic" sx 3 cont IV abx through today   LOS: 7 days    Pamela Walters 09/25/2013  Comfortable She has had a week of antibiotics now- they were actually started Monday rather than Tuesday. Will dc antibiotics and try soft diet. If tolerated- dc home tomorrow

## 2013-09-25 NOTE — Telephone Encounter (Signed)
Left message for patient to call back  

## 2013-09-25 NOTE — Telephone Encounter (Signed)
Message copied by Marlon Pel on Mon Sep 25, 2013  4:42 PM ------      Message from: Jerene Bears      Created: Mon Sep 25, 2013  1:48 PM       OV in 1 month      Post-hospital, esophageal food impaction and perforation      JMP ------

## 2013-09-25 NOTE — Progress Notes (Signed)
Visit with patient this afternoon Tolerating advanced diet to full liquids Plans for discontinuation of antibiotics after today and likely TPN Thoracic surgery following, and appreciate their care Followup with me in the office after discharge Patient thanked me for the visit and expressed desire for followup

## 2013-09-25 NOTE — Progress Notes (Addendum)
PARENTERAL NUTRITION CONSULT NOTE - FOLLOW UP  Pharmacy Consult for TPN Indication: Esophageal perforation  No Known Allergies  Patient Measurements: Height: 5\' 3"  (160 cm) Weight: 179 lb (81.194 kg) IBW/kg (Calculated) : 52.4 Adjusted Body Weight:  Usual Weight:   Vital Signs: Temp: 98.4 F (36.9 C) (06/01 0503) Temp src: Oral (06/01 0503) BP: 144/75 mmHg (06/01 0503) Pulse Rate: 76 (06/01 0503) Intake/Output from previous day: 05/31 0701 - 06/01 0700 In: 720 [P.O.:720] Out: -  Intake/Output from this shift: Total I/O In: 240 [P.O.:240] Out: -   Labs:  Recent Labs  09/23/13 1000 09/25/13 0430  WBC 6.9 8.9  HGB 11.5* 11.2*  HCT 34.0* 34.6*  PLT 275 287     Recent Labs  09/23/13 0500 09/24/13 0531 09/25/13 0430  NA 141 138 140  K 3.8 3.7 3.9  CL 105 102 103  CO2 25 26 25   GLUCOSE 101* 106* 128*  BUN 15 16 13   CREATININE 0.86 0.87 0.83  CALCIUM 9.1 8.8 9.0  MG  --   --  2.1  PHOS  --   --  3.3  PROT  --   --  6.8  ALBUMIN  --   --  3.2*  AST  --   --  19  ALT  --   --  19  ALKPHOS  --   --  57  BILITOT  --   --  0.2*  TRIG  --   --  239*   Estimated Creatinine Clearance: 77.3 ml/min (by C-G formula based on Cr of 0.83).    Recent Labs  09/24/13 1730 09/24/13 2257 09/25/13 0616  GLUCAP 132* 128* 133*    Medications:  Scheduled:  . fluconazole  100 mg Oral Daily  . heparin  5,000 Units Subcutaneous 3 times per day  . insulin aspart  0-9 Units Subcutaneous 4 times per day  . loratadine  10 mg Oral Daily  . pantoprazole (PROTONIX) IV  40 mg Intravenous Q12H  . piperacillin-tazobactam (ZOSYN)  IV  3.375 g Intravenous Q8H  . sodium chloride  3 mL Intravenous Q12H  . vancomycin  1,250 mg Intravenous Q12H    Insulin Requirements in the past 12 hours:  None  Assessment:  53 yo lady who presented with food impaction >24 hours. She had an EGD to remove food and was noted to have a mucosal tear. Gastrograffin swallow did not show contrast  leak. Plan to manage her non-operatively. PICC line was placed for TPN.   GI: Esophageal perforation. TPN started 5/27. Noted plan to repeat swallow with gastrograffin followed by barium - not done yet.. Pre-albumin 17.4 (5/27), alb 3.2. Change to po PPI. Tolerating full liquid diet.  Endo: No hx DM.  CBGs/24h: 118-132  Lytes: Lytes WNL, K 3.9  Renal: SCr 0.83, CrCl~70-80 ml/min  Cards: Hx HTN/DL. VSS. No CV meds  Hepatobil: LFTs wnl. TG 216 (5/28)>>239  Neuro: Hx MS. PTA prozac, tizanidine currently held  ID: Vanc/Zosyn D#7 (5/26 >> ) for esophageal perforation. May stop 6/1. Afeb, WBC wnl. Fluconazole/MMW po started for possibly thrush.  Pulm: Sinus congestion and sore throat  Best Practices: Hep SQ  TPN Access: PICC 5/26  TPN day#: 3 (5/28 >> current)  Current Nutrition:  Clinimix E 5/15 at 83 ml/hr and lipids at 54ml/hr provides 1843 kcal and 96 grams protein (100% of goal)  Nutritional Goals:  1800-2000 kcal and 90-100g pro per RD assessment on 5/29  Plan:  - full liquid diet. -  Continue Clinimix-E 5/15 at 83 ml/hr - Continue Lipids at 10 ml/hr - change MV to po. Contains trace elements. - d/c CBGs and SSI - Hopefully can d/c TPN in 1-2 more days.   Pamela Walters, PharmD, [facility] Clinical Staff Pharmacist Pager [phone]   09/25/2013 9:43 AM

## 2013-09-26 ENCOUNTER — Telehealth: Payer: Self-pay | Admitting: Infectious Disease

## 2013-09-26 MED ORDER — PANTOPRAZOLE SODIUM 40 MG PO TBEC: 40.0000 mg | DELAYED_RELEASE_TABLET | Freq: Every day | ORAL | Status: DC

## 2013-09-26 NOTE — Progress Notes (Signed)
Pt given d/c instructions at this time; pt verbalized understanding; PICC line previously removed per IV team; bedrest complete at this time; VSS; pt denies pain; pt to d/c home with family; family in route to pick pt up from hospital; will cont. To monitor.

## 2013-09-26 NOTE — Discharge Instructions (Signed)
Please continue to slowly advance diet.  Call Dr. Blanch Media office for follow up appointment in 1 month.  You can continue to take Protonix 40mg  once a day, if this is too expensive you can buy over the counter omeprazole 20mg  and take once a day. (protonix is more effective).  Please make an appointment to follow up with your primary care physician in 1 to 2 weeks.  Please call you physician or return to the ED if your symptoms worsen.      Swallowed Foreign Body, Adult You have swallowed an object (foreign body). Once the foreign body has passed through the food tube (esophagus), which leads from the mouth to the stomach, it will usually continue through the body without problems. This is because the point where the esophagus enters into the stomach is the narrowest place through which the foreign body must pass. Sometimes the foreign body gets stuck. The most common type of foreign body obstruction in adults is food impaction. Many times, bones from fish or meat products may become lodged in the esophagus or injure the throat on the way down. When there is an object that obstructs the esophagus, the most obvious symptoms are pain and the inability to swallow normally. In some cases, foreign bodies that can be life threatening are swallowed. Examples of these are certain medications and illicit drugs. Often in these instances, patients are afraid of telling what they swallowed. However, it is extremely important to tell the emergency caregiver what was swallowed because life-saving treatment may be needed.  X-ray exams may be taken to find the location of the foreign body. However, some objects do not show up well or may be too small to be seen on an X-ray image. If the foreign body is too large or too sharp, it may be too dangerous to allow it to pass on its own. You may need to see a caregiver who specializes in the digestive system (gastroenterologist). In a few cases, a specialist may need to remove the  object using a method called "endoscopy". This involves passing a thin, soft, flexible tube into the food pipe to locate and remove the object. Follow up with your primary doctor or the referral you were given by the emergency caregiver. HOME CARE INSTRUCTIONS   If your caregiver says it is safe for you to eat, then only have liquids and soft foods until your symptoms improve.  Once you are eating normally:  Cut food into small pieces.  Remove small bones from food.  Remove large seeds and pits from fruit.  Chew your food well.  Do not talk, laugh, or engage in physical activity while eating or swallowing. SEEK MEDICAL CARE IF:  You develop worsening shortness of breath, uncontrollable coughing, chest pains or high fever, greater than 102 F (38.9 C).  You are unable to eat or drink or you feel that food is getting stuck in your throat.  You have choking symptoms or cannot stop drooling.  You develop abdominal pain, vomiting (especially of blood), or rectal bleeding. MAKE SURE YOU:   Understand these instructions.  Will watch your condition.  Will get help right away if you are not doing well or get worse. Document Released: 10/01/2009 Document Revised: 07/06/2011 Document Reviewed: 10/01/2009 [facility] Patient Information 2014 [place].  Aspiration Precautions Aspiration is the inhaling of a liquid or object into the lungs. Things that can be inhaled into the lungs include:  Food.  Any type of liquid, such as drinks  or saliva.  Stomach contents, such as vomit or stomach acid. When these things go into the lungs, damage can occur. Serious complications can then result, such as:  A lung infection (pneumonia).  A collection of pus in the lungs (lung abscess). CAUSES  A decreased level of awareness (consciousness) due to:  Traumatic brain injury or head injury.  Stroke.  Neurological disease.  Seizures.  Decreased or absent gag reflex (inability to  cough).  Medical conditions that affect swallowing.  Conditions that affect the food pipe (esophagus) such as a narrowing of the esophagus (esophageal stricture).  Gastroesophageal reflux (GERD). This is also known as acid reflux.  Any type of surgery where you are put under general anesthesia or have sedation.  Drinking large amounts of alcohol.  Taking medication that causes drowsiness, confusion, or weakness.  Aging.  Dental problems.  Having a feeding tube. SYMPTOMS When aspiration occurs, different signs and symptoms can occur, such as:  Coughing (if a person has a cough or gag reflex) after swallowing food or liquids.  Difficulty breathing. This can include things like:  Breathing rapidly.  Breathing very slowly.  Hearing "gurgling" lung sounds when a person breaths.  Coughing up phlegm (sputum) that is:  Yellow, tan, or green in color.  Has pieces of food in it.  Bad smelling.  A change in voice (hoarseness).  A change in skin color. The skin may turn a "bluish" type color because of a lack of oxygen (cyanosis).  Fever. DIAGNOSIS  A chest X-ray may be performed. This takes a picture of your lungs. It can show changes in the lungs if aspiration has occurred.  A bronchoscopy may be performed. This is a surgical procedure in which a thin, flexible tube with a camera at the end is inserted into the nose or mouth. The tube is advanced to the lungs so your caregiver can view the lungs and obtain a culture, tissue sample, or remove an aspirated object.  A swallowing evaluation study may be performed to evaluate:  A person's risk of aspiration.  How difficult it is for a person to swallow.  What types of foods are safe for a person to eat. PREVENTION If you are a caregiver to someone who may aspirate, follow the directions below. If you are caring for someone who can eat and drink through their mouth:  Have them sit in an upright position when eating food  or drinking fluids, such as:  Sitting up in a chair.  If sitting in a chair is not possible, position the person in bed so they are upright.  Remind the person to eat slowly and chew well.  Do not distract the person. This is especially important for people with thinking or memory (cognitive) problems.  Check the person's mouth for leftover food after eating.  Keep the person sitting upright for 30 to 45 minutes after eating.  Do not serve food or drink for at least 2 hours before bedtime. If you are caring for someone with a feeding tube and he or she cannot eat or drink through their mouth:  Keep the person in an upright position as much as possible.  Do not  lay the person flat if they are getting continuous feedings. Turn the feeding pump off if you need to lay the person flat for any reason.  Check feeding tube residuals as directed by your caregiver. If a large amount of tube feedings are pulled back (aspirated) from the feeding tube, call your  caregiver right away. General guidelines to prevent aspiration include:  Feed small amounts of food. Do not force feed.  Use as little water as possible when brushing the person's teeth or cleaning his or her mouth.  Provide oral care before and after meals.  Never put food or fluids in the mouth of a person who is not fully alert.  Crush pills and put them in soft food such as pudding or ice cream. Some pills should not be crushed. Check with your caregiver before crushing any medication. SEEK IMMEDIATE MEDICAL CARE IF:   The person has trouble breathing or starts to breathe rapidly.  The person is breathing very slowly or stops breathing.  The person coughs a lot after eating or drinking.  The person has a chronic cough.  The person coughs up thick, yellow, or tan sputum.  The person has a fever or persistent symptoms for more than 72 hours.  The person has a fever and their symptoms suddenly get worse. Document  Released: 05/16/2010 Document Revised: 07/06/2011 Document Reviewed: 05/16/2010 [facility] Patient Information 2014 [place], [place].  Esophageal Stricture The esophagus is the long, narrow tube which carries food and liquid from the mouth to the stomach. Sometimes a part of the esophagus becomes narrow and makes it difficult, painful, or even impossible to swallow. This is called an esophageal stricture.  CAUSES  Common causes of blockage or strictures of the esophagus are:  Exposure of the lower esophagus to the acid from the stomach may cause narrowing.  Hiatal hernia in which a small part of the stomach bulges up through the diaphragm can cause a narrowing in the bottom of the esophagus.  Scleroderma is a tissue disorder that affects the esophagus and makes swallowing difficult.  Achalasia is an absence of nerves in the lower esophagus and to the esophageal sphincter. This absence of nerves may be congenital (present since birth). This can cause irregular spasms which do not allow food and fluid through.  Strictures may develop from swallowing materials which damage the esophagus. Examples are acids or alkalis such as lye.  Schatzki's Ring is a narrow ring of non-cancerous tissue which narrows the lower esophagus. The cause of this is unknown.  Growths can block the esophagus. SYMPTOMS  Some of the problems are difficulty swallowing or pain with swallowing. DIAGNOSIS  Your caregiver often suspects this problem by taking a medical history. They will also do a physical exam. They may then take X-rays and/or perform an endoscopy. Endoscopy is an exam in which a tube like a small flexible telescope is used to look at your esophagus.  TREATMENT  One form of treatment is to dilate the narrow area. This means to stretch it.  When this is not successful, chest surgery may be required. This is a much more extensive form of treatment with a longer recovery time. Both of the above treatments make  the passage of food and water into the stomach easier. They also make it easier for stomach contents to bubble back into the esophagus. Special medications may be used following the procedure to help prevent further narrowing. Medications may be used to lower the amount of acid in the stomach juice.  SEEK IMMEDIATE MEDICAL CARE IF:   Your swallowing is becoming more painful, difficult, or you are unable to swallow.  You vomit up blood.  You develop black tarry stools.  You develop chills.  You have a fever.  You develop chest or abdominal pain.  You develop shortness  of breath, feel lightheaded, or faint. Follow up with medical care as your caregiver suggests. Document Released: 12/22/2005 Document Revised: 07/06/2011 Document Reviewed: 01/28/2006 [facility] Patient Information 2014 [place], [place].

## 2013-09-26 NOTE — Progress Notes (Addendum)
      [address]       [place] [address]             [phone]      8 Days Post-Op Procedure(s) (LRB): ESOPHAGOGASTRODUODENOSCOPY (EGD) (N/A)  Subjective:  [name] has no complaints this morning.  She tolerated her soft diet without difficulty.  She denies nausea, vomiting  Objective: Vital signs in last 24 hours: Temp:  [97.6 F (36.4 C)-99 F (37.2 C)] 97.6 F (36.4 C) (06/02 0437) Pulse Rate:  [71-93] 71 (06/02 0437) Cardiac Rhythm:  [-]  Resp:  [18] 18 (06/02 0437) BP: (93-124)/(51-76) 93/51 mmHg (06/02 0437) SpO2:  [95 %-98 %] 98 % (06/02 0437) Weight:  [178 lb 12.7 oz (81.1 kg)] 178 lb 12.7 oz (81.1 kg) (06/02 0437)  Intake/Output from previous day: 06/01 0701 - 06/02 0700 In: 600 [P.O.:600] Out: -   General appearance: alert, cooperative and no distress Heart: regular rate and rhythm Lungs: clear to auscultation bilaterally Abdomen: soft, non-tender; bowel sounds normal; no masses,  no organomegaly  Lab Results:  Recent Labs  09/23/13 1000 09/25/13 0430  WBC 6.9 8.9  HGB 11.5* 11.2*  HCT 34.0* 34.6*  PLT 275 287   BMET:  Recent Labs  09/24/13 0531 09/25/13 0430  NA 138 140  K 3.7 3.9  CL 102 103  CO2 26 25  GLUCOSE 106* 128*  BUN 16 13  CREATININE 0.87 0.83  CALCIUM 8.8 9.0    PT/INR: No results found for this basename: LABPROT, INR,  in the last 72 hours ABG    Component Value Date/Time   TCO2 25 09/18/2013 2244   CBG (last 3)   Recent Labs  09/25/13 0616 09/25/13 1154 09/25/13 1804  GLUCAP 133* 140* 159*    Assessment/Plan: S/P Procedure(s) (LRB): ESOPHAGOGASTRODUODENOSCOPY (EGD) (N/A)  1. Tolerating a soft diet 2. ABX regimen completed 3. Dispo- patient okay to discharge home from esophageal standpoint, care per primary   LOS: 8 days    Ellwood Handler 09/26/2013  Patient seen and examined, agree with above

## 2013-09-26 NOTE — Discharge Summary (Signed)
Name: Pamela Walters MRN: [id number] DOB: Jan 25, 1961 53 y.o. PCP: Fayrene Helper, MD  Date of Admission: 09/18/2013  4:28 PM Date of Discharge: 09/26/2013 Attending Physician: Bartholomew Crews, MD  Discharge Diagnosis: Principal Problem:   Esophageal obstruction due to food impaction with Esophageal rupture Active Problems:   HYPERLIPIDEMIA   Obesity, unspecified   DEPRESSION   MULTIPLE SCLEROSIS, PROGRESSIVE/RELAPSING   HYPERTENSION   Chest pain   Hypokalemia  Discharge Medications:   Medication List         FLUoxetine 40 MG capsule  Commonly known as:  PROZAC  Take 1 capsule (40 mg total) by mouth daily.     HYDROcodone-acetaminophen 5-325 MG per tablet  Commonly known as:  NORCO/VICODIN  Take 1 tablet by mouth daily as needed (pain).     ibuprofen 200 MG tablet  Commonly known as:  ADVIL,MOTRIN  Take 800 mg by mouth 2 (two) times daily as needed (pain).     multivitamin with minerals Tabs tablet  Take 1 tablet by mouth daily.     pantoprazole 40 MG tablet  Commonly known as:  PROTONIX  Take 1 tablet (40 mg total) by mouth daily.     pravastatin 80 MG tablet  Commonly known as:  PRAVACHOL  Take 80 mg by mouth at bedtime.     tiZANidine 4 MG capsule  Commonly known as:  ZANAFLEX  Take 4 mg by mouth 2 (two) times daily as needed (back spasms).     triamterene-hydrochlorothiazide 37.5-25 MG per tablet  Commonly known as:  MAXZIDE-25  Take 1 tablet by mouth daily.        Disposition and follow-up:   Pamela Walters was discharged from [facility] in Stable condition.  At the hospital follow up visit please address:  1.  Tolerance of diet, further symptoms of esophageal irritation.    2.  Labs / imaging needed at time of follow-up: None  3.  Pending labs/ test needing follow-up: None  Follow-up Appointments:     Follow-up Information   Follow up with [name], MD. Schedule an appointment as soon as possible for  a visit in 2 weeks. (for hospital follow up)    Specialty:  Family Medicine   Contact information:   [address] [place] [address] [phone]       Follow up with [name], MD. Schedule an appointment as soon as possible for a visit in 1 month.   Specialty:  Gastroenterology   Contact information:   [address] [place] [address] [phone]       Discharge Instructions: Discharge Instructions   Discharge diet:    Complete by:  As directed   Soft foods, slowly advance.     Discharge instructions    Complete by:  As directed   Please continue to slowly advance diet.  Call Dr. Blanch Media office for follow up appointment in 1 month.  You can continue to take Protonix 40mg  once a day, if this is too expensive you can buy over the counter omeprazole 20mg  and take once a day. (protonix is more effective).  Please make an appointment to follow up with your primary care physician in 1 to 2 weeks.  Please call you physician or return to the ED if your symptoms worsen.     Increase activity slowly    Complete by:  As directed            Consultations:  Treatment Team:  Melrose Nakayama, MD  Procedures Performed:  Dg Chest 2 View  09/23/2013   CLINICAL DATA:  Sagittal tear, dysphagia, cough  EXAM: CHEST  2 VIEW  COMPARISON:  09/20/2013  FINDINGS: Right-sided PICC line with the tip projecting over the SVC. There is no focal parenchymal opacity, pleural effusion, or pneumothorax. The heart and mediastinal contours are unremarkable.  There is mild thoracic spine spondylosis.  IMPRESSION: No active cardiopulmonary disease.   Electronically Signed   By: Kathreen Devoid   On: 09/23/2013 16:51   X-ray Chest Pa And Lateral   09/20/2013   CLINICAL DATA:  Followup esophageal rupture.  EXAM: CHEST  2 VIEW  COMPARISON:  09/18/2013  FINDINGS: Pneumomediastinum and neck base air is less apparent on the current study than on the prior exam.  No pneumothorax.  Lungs are clear.   Minimal pleural effusions.  Cardiac silhouette is normal in size. Normal mediastinal and hilar contours. Right PICC tip lies at the caval atrial junction.  IMPRESSION: Pneumomediastinum and neck base air appears less prominent. No acute findings in the lungs. No pneumothorax. The right PICC is well positioned.   Electronically Signed   By: Lajean Manes M.D.   On: 09/20/2013 08:03   Dg Chest 2 View  09/18/2013   CLINICAL DATA:  Status post procedure; mild mucosal tearing of the esophagus. Assess for pneumomediastinum.  EXAM: CHEST  2 VIEW  COMPARISON:  Chest radiograph performed earlier today at 2:56 p.m.  FINDINGS: Scattered soft tissue air is noted tracking about both sides of the neck, worse on the right, and to a lesser extent about the superior mediastinum. This raises suspicion for either a hypopharyngeal mucosal tear or possibly a tear at the upper esophagus. The remainder of the mediastinum is unremarkable in appearance. Prominent prevertebral soft tissue air is noted on the lateral view.  The lungs are clear bilaterally. No focal consolidation, pleural effusion or pneumothorax is seen.  No acute osseous abnormalities are identified.  IMPRESSION: Scattered soft tissue air tracking about both sides of the neck, worse on the right, and to a lesser extent about the superior mediastinum. Prominent prevertebral soft tissue air noted on the lateral view. This raises suspicion for either a hypopharyngeal mucosal tear, more likely on the right, or possibly a tear at the upper esophagus.  These results were called by telephone at the time of interpretation on 09/18/2013 at 9:47 PM to Dr. Davonna Belling, who verbally acknowledged these results.   Electronically Signed   By: Garald Balding M.D.   On: 09/18/2013 21:48   Dg Chest 2 View  09/18/2013   CLINICAL DATA:  Right-sided chest pain.  EXAM: CHEST  2 VIEW  COMPARISON:  None.  FINDINGS: Cardiac silhouette is normal in size. The aorta is mildly uncoiled. No  mediastinal or hilar masses. No evidence of adenopathy.  Lungs are clear.  No pleural effusion.  No pneumothorax.  Bony thorax is demineralized but intact.  No evidence of an opaque esophageal foreign body.  IMPRESSION: No active cardiopulmonary disease.   Electronically Signed   By: Lajean Manes M.D.   On: 09/18/2013 15:10   Ct Soft Tissue Neck W Contrast  09/19/2013   CLINICAL DATA:  Recent food impaction with perforation  EXAM: CT NECK WITH CONTRAST, CT the chest with contrast  TECHNIQUE: Multidetector CT imaging of the neck and chest was performed using the standard protocol following the bolus administration of intravenous contrast. Additionally a small amount of oral  contrast was administered.  CONTRAST:  51mL OMNIPAQUE IOHEXOL 300 MG/ML  SOLN  COMPARISON:  None.  FINDINGS: CT of the neck:  Skull base has contents are within normal limits. The parotid and submandibular glands are unremarkable. There is considerable amount of retropharyngeal a year identified extending throughout the neck from the recent esophageal perforation. A definitive sided perforation is not well appreciated on this exam. No significant lymphadenopathy is noted. The osseous structures are within normal limits with only mild facet hypertrophic changes.  CT of the chest:  The lungs are well aerated bilaterally without evidence of focal infiltrate or sizable effusion. A minimal amount of dependent atelectasis is noted bilaterally. The thoracic aorta and pulmonary artery as visualized are within normal limits. The origins of great vessels are unremarkable.  There is a considerable amount of mediastinal air identified related to the recent esophageal perforation. It extends superiorly into the neck and inferiorly into the upper abdomen surrounding the gastroesophageal junction. No large full thickness tear of the esophagus is identified. A small amount contrast was administered and is noted in the distal esophagus. Middle most soft tissue  density is noted within the contrast column which may represent a focal mucosal tear. No extravasation of contrast material into the mediastinum is identified. The visualized upper abdomen is within normal limits. The bony structures demonstrate degenerative change of thoracic spine.  IMPRESSION: CT of the neck: Considerable retropharyngeal air consistent with the recent esophageal perforation. No focal area to suggest a site of perforation is identified.  CT of the chest: Considerable mediastinal air consistent with recent esophageal perforation. There is an area of mucosal irregularity noted just above the gastroesophageal junction which may represent a partial thickness mucosal tear. No frank extravasation of contrast material into the mediastinum is noted.   Electronically Signed   By: Inez Catalina M.D.   On: 09/19/2013 07:15   Ct Chest W Contrast  09/19/2013   CLINICAL DATA:  Recent food impaction with perforation  EXAM: CT NECK WITH CONTRAST, CT the chest with contrast  TECHNIQUE: Multidetector CT imaging of the neck and chest was performed using the standard protocol following the bolus administration of intravenous contrast. Additionally a small amount of oral contrast was administered.  CONTRAST:  3mL OMNIPAQUE IOHEXOL 300 MG/ML  SOLN  COMPARISON:  None.  FINDINGS: CT of the neck:  Skull base has contents are within normal limits. The parotid and submandibular glands are unremarkable. There is considerable amount of retropharyngeal a year identified extending throughout the neck from the recent esophageal perforation. A definitive sided perforation is not well appreciated on this exam. No significant lymphadenopathy is noted. The osseous structures are within normal limits with only mild facet hypertrophic changes.  CT of the chest:  The lungs are well aerated bilaterally without evidence of focal infiltrate or sizable effusion. A minimal amount of dependent atelectasis is noted bilaterally. The  thoracic aorta and pulmonary artery as visualized are within normal limits. The origins of great vessels are unremarkable.  There is a considerable amount of mediastinal air identified related to the recent esophageal perforation. It extends superiorly into the neck and inferiorly into the upper abdomen surrounding the gastroesophageal junction. No large full thickness tear of the esophagus is identified. A small amount contrast was administered and is noted in the distal esophagus. Middle most soft tissue density is noted within the contrast column which may represent a focal mucosal tear. No extravasation of contrast material into the mediastinum is identified. The visualized  upper abdomen is within normal limits. The bony structures demonstrate degenerative change of thoracic spine.  IMPRESSION: CT of the neck: Considerable retropharyngeal air consistent with the recent esophageal perforation. No focal area to suggest a site of perforation is identified.  CT of the chest: Considerable mediastinal air consistent with recent esophageal perforation. There is an area of mucosal irregularity noted just above the gastroesophageal junction which may represent a partial thickness mucosal tear. No frank extravasation of contrast material into the mediastinum is noted.   Electronically Signed   By: Inez Catalina M.D.   On: 09/19/2013 07:15   Dg Esophagus W/water Sol Cm  09/23/2013   CLINICAL DATA:  Follow-up esophageal perforation  EXAM: ESOPHOGRAM/BARIUM SWALLOW  TECHNIQUE: Combined double contrast and single contrast examination performed using water-soluble contrast followed by thick barium.  FLUOROSCOPY TIME:  1 min 5 seconds  COMPARISON:  09/19/2013  FINDINGS: Initial evaluation was performed with water-soluble contrast demonstrating no extravasation of contrast from the esophagus.  Subsequently barium was administered by mouth. Examination was performed in the upright position. There is no extravasation of contrast  from the distal esophagus in the region of endoscopically noted mucosal tear. There is mild mucosal irregularity just above Schatzki's ring likely corresponding to the endoscopic findings. There is no obstruction. Contrast empties from the esophagus into the stomach without delay. There is a Schatzki's ring present.  IMPRESSION: Small area of mucosal irregularity of the distal esophagus corresponding to the endoscopic findings. No esophageal leak.   Electronically Signed   By: Kathreen Devoid   On: 09/23/2013 13:54   Dg Esophagus W/water Sol Cm  09/19/2013   CLINICAL DATA:  History of food impaction in distal esophagus. Upper endoscopy revealed a mucosal tear in the distal esophagus, just proximal to a Schatzki's ring.  EXAM: ESOPHOGRAM/BARIUM SWALLOW  TECHNIQUE: Single contrast examination was performed using water-soluble contrast material followed by thin barium.  FLUOROSCOPY TIME:  2 min and 25 seconds.  COMPARISON:  Chest CT from 09/19/2013.  FINDINGS: Pneumomediastinum is observed fluoroscopically.  The patient was initially given sips of water soluble contrast material mall mean monitored fluoroscopically. There was no evidence for contrast extravasation from the esophagus.  Subsequently, thin barium was given by mouth. This was performed in both upright and oblique supine positions. No extravasation of contrast from the distal esophagus, in the region of the endoscopically noted mucosal tear, was observed. There was some subtle mucosal irregularity in the distal esophagus just proximal to a Schatzki's ring that likely represents the area of mucosal injury, but no leak of barium at this location was observed.  As noted endoscopically, the patient does have a Schatzki's ring. There is a small associated hiatal hernia.  IMPRESSION: Small area of mucosal irregularity in the distal esophagus, just proximal to a Schatzki ring. This is compatible with the area of mucosal abnormality identified endoscopically. No  leak or extravasation of contrast from the distal esophagus is observed despite repeated swallows water-soluble and thin barium contrast material.  Pneumomediastinum.   Electronically Signed   By: Misty Stanley M.D.   On: 09/19/2013 08:57   Admission HPI: Patient is a 53 year old woman with a PMH of MS, HTN, LD and chronic back pain, who presented with esophageal food impaction.  Patient states that she was eating dinner at a local restaurant two nights ago, when she noted a piece of chicken became lodged in the middle part of her chest area. She was unable to swallow any food or  liquids, and has had multiple vomiting vs "spitting" episodes since. She also has had difficulty swallowing her secretions. Patient came to the ED for the further evaluation yesterday on 09/18/13, and underwent upper endoscopy. "An esophageal mucosal tear"was noted at the level of the food impaction once a meat bolus was removed from the lower esophagus. Patient later reports chest pain and right neck pain. She describes that her chest pain/soreness was in the middle of chest, persistent, sharp, 7/10, radiating to her back. Denies feeling distressed, fever, chills, diaphoresis, heart burn, SOB, dyspnea, palpitation, nausea, abdominal pain, weakness or numbness. Dnies history of esophageal dysphagia or odynophagia. A follow up CXR and chest CT showed air in the mediastinum and neck. An urgent Gastrografin swallow study confirmed pneumomediastinum but "no leak or extravasation of contrast from the distal esophagus". Patient is admitted and the CVTS is also consulted.    Hospital Course by problem list:   Esophageal obstruction due to food impaction with   Esophageal rupture - Patient presented after feeling that chicken dinner from night before was loged in throat.  Urgent EGD revealed food impaction and esophageal tear.  CVTS was consulted and patient was placed NPO which she remained for 7 days, she also completed a 7 day course of  Zoysn.  She was placed on TPN while NPO but diet was slowly advanced and patient was discharged once able to tolerate a soft diet.  She had no further complications.  She will follow up with PCP in 1-2 weeks and with GI in 1 month.  She is to continue protonix until GI follow up.  Seasonal Allergies Patient reported some post nasal drip and itchy eyes, initally she was started on diflucan by CVTS for concern for thrush although none was seen on physical exam.  However her symptoms improved after addition of loratadine.  Diflucan was discontinued.  Hypokalemia - Patient developed mild hypokalemia, potassium was replaced as indicated and patient had normal potassium level on discharge.  Discharge Vitals:   BP 93/51  Pulse 71  Temp(Src) 97.6 F (36.4 C) (Oral)  Resp 18  Ht 5\' 3"  (1.6 m)  Wt 178 lb 12.7 oz (81.1 kg)  BMI 31.68 kg/m2  SpO2 98%  Discharge Labs:  Results for orders placed during the hospital encounter of 09/18/13 (from the past 24 hour(s))  GLUCOSE, CAPILLARY     Status: Abnormal   Collection Time    09/25/13 11:54 AM      Result Value Ref Range   Glucose-Capillary 140 (*) 70 - 99 mg/dL  GLUCOSE, CAPILLARY     Status: Abnormal   Collection Time    09/25/13  6:04 PM      Result Value Ref Range   Glucose-Capillary 159 (*) 70 - 99 mg/dL    Signed: Joni Reining, DO 09/26/2013, 11:05 AM   Time Spent on Discharge: 46minutes Services Ordered on Discharge: none Equipment Ordered on Discharge: none

## 2013-09-26 NOTE — Telephone Encounter (Signed)
Left message for patient to call back  

## 2013-09-26 NOTE — Telephone Encounter (Signed)
Pamela Walters, I noted that epic is now showing a reactive HIV antibody--previously listed as NR. I will call the lab tomorrow to clarify what is going on . Perhaps her EIA was negative but her ag or her RNA test is + in this 4th generation test  I will bring her to my clinic to get her into care if this is a true positive

## 2013-09-26 NOTE — Progress Notes (Signed)
Subjective: Patient feels much better, able to tolerate soft diet, reports did not eat too much, she "does not want to over do it."  Her post nasal drip and itchy eyes have improved with Claritin. Objective: Vital signs in last 24 hours: Filed Vitals:   09/25/13 0503 09/25/13 1429 09/25/13 2107 09/26/13 0437  BP: 144/75 124/76 117/73 93/51  Pulse: 76 93 90 71  Temp: 98.4 F (36.9 C) 99 F (37.2 C) 98.2 F (36.8 C) 97.6 F (36.4 C)  TempSrc: Oral Oral Oral Oral  Resp: 17 18 18 18   Height:      Weight: 179 lb (81.194 kg)   178 lb 12.7 oz (81.1 kg)  SpO2: 96% 97% 95% 98%   Weight change: -3.3 oz (-0.094 kg)  Intake/Output Summary (Last 24 hours) at 09/26/13 0738 Last data filed at 09/25/13 1429  Gross per 24 hour  Intake    600 ml  Output      0 ml  Net    600 ml   General: resting in bed HEENT: no exudate Cardiac: RRR, no murmurs Pulm: CTAB Abd: soft, nontender, nondistended, BS present Ext: warm and well perfused, no pedal edema Lab Results: Basic Metabolic Panel:  Recent Labs Lab 09/21/13 0255  09/24/13 0531 09/25/13 0430  NA 140  < > 138 140  K 3.1*  < > 3.7 3.9  CL 102  < > 102 103  CO2 24  < > 26 25  GLUCOSE 101*  < > 106* 128*  BUN 9  < > 16 13  CREATININE 0.79  < > 0.87 0.83  CALCIUM 8.6  < > 8.8 9.0  MG 2.0  --   --  2.1  PHOS 2.5  --   --  3.3  < > = values in this interval not displayed. Liver Function Tests:  Recent Labs Lab 09/21/13 0255 09/25/13 0430  AST 18 19  ALT 10 19  ALKPHOS 47 57  BILITOT 0.4 0.2*  PROT 6.5 6.8  ALBUMIN 3.2* 3.2*   CBC:  Recent Labs Lab 09/20/13 0500  09/23/13 1000 09/25/13 0430  WBC 7.6  < > 6.9 8.9  NEUTROABS 5.5  --   --   --   HGB 10.9*  < > 11.5* 11.2*  HCT 33.0*  < > 34.0* 34.6*  MCV 81.5  < > 80.4 81.6  PLT 249  < > 275 287  < > = values in this interval not displayed. Cardiac Enzymes:  Recent Labs Lab 09/19/13 1100 09/19/13 1725 09/19/13 2145  TROPONINI <0.30 <0.30 <0.30    CBG:  Recent Labs Lab 09/24/13 1135 09/24/13 1730 09/24/13 2257 09/25/13 0616 09/25/13 1154 09/25/13 1804  GLUCAP 119* 132* 128* 133* 140* 159*   Fasting Lipid Panel:  Recent Labs Lab 09/25/13 0430  TRIG 239*   Coagulation:  Recent Labs Lab 09/19/13 1100  LABPROT 14.0  INR 1.10     Micro Results: Recent Results (from the past 240 hour(s))  MRSA PCR SCREENING     Status: Abnormal   Collection Time    09/19/13 10:07 AM      Result Value Ref Range Status   MRSA by PCR POSITIVE (*) NEGATIVE Final   Comment:            The GeneXpert MRSA Assay (FDA     approved for NASAL specimens     only), is one component of a     comprehensive MRSA colonization     surveillance  program. It is not     intended to diagnose MRSA     infection nor to guide or     monitor treatment for     MRSA infections.     RESULT CALLED TO, READ BACK BY AND VERIFIED WITH:     WHITE RN 11:50 09/19/13 ([email or web address])  CULTURE, BLOOD (ROUTINE X 2)     Status: None   Collection Time    09/19/13 11:00 AM      Result Value Ref Range Status   Specimen Description BLOOD LEFT ANTECUBITAL   Final   Special Requests BOTTLES DRAWN AEROBIC ONLY 3CC   Final   Culture  Setup Time     Final   Value: 09/19/2013 16:25     Performed at [facility]   Culture     Final   Value: NO GROWTH 5 DAYS     Performed at [facility]   Report Status 09/25/2013 FINAL   Final  CULTURE, BLOOD (ROUTINE X 2)     Status: None   Collection Time    09/19/13 11:14 AM      Result Value Ref Range Status   Specimen Description BLOOD LEFT HAND   Final   Special Requests BOTTLES DRAWN AEROBIC ONLY 5CC   Final   Culture  Setup Time     Final   Value: 09/19/2013 16:25     Performed at [facility]   Culture     Final   Value: NO GROWTH 5 DAYS     Performed at [facility]   Report Status 09/25/2013 FINAL   Final   Studies/Results: No results found. Medications: I have reviewed the patient's  current medications. Scheduled Meds: . fluconazole  100 mg Oral Daily  . heparin  5,000 Units Subcutaneous 3 times per day  . loratadine  10 mg Oral Daily  . multivitamin with minerals  1 tablet Oral Daily  . pantoprazole  40 mg Oral BID  . sodium chloride  3 mL Intravenous Q12H   Continuous Infusions: . sodium chloride 10 mL/hr at 09/22/13 0813   PRN Meds:.antiseptic oral rinse, LORazepam, magic mouthwash, morphine injection, ondansetron (ZOFRAN) IV, phenol, sodium chloride Assessment/Plan:    Esophageal rupture due to Esophageal obstruction due to food impaction Diagnosed by EGD 8 days ago.  Patient has completed 7 day empiric Abx course. Is now tolerating a soft diet. -Patient stable for discharge. - She will follow up with GI in 1 month., PCP in 1 -2 weeks. - Will give 1 month Rx for protonix to take until GI follow up.    HYPERLIPIDEMIA -Restart pravastatin on discharge    DEPRESSION - Restart fluoxetine on discharge    History of MULTIPLE SCLEROSIS, PROGRESSIVE/RELAPSING - Not on any medication currently    HYPERTENSION -Currently normotensive, if hypertensive can restart home medication.    Hypokalemia -Resolved  Season allergies - improved on loratadine 10mg  daily - will resume allegra on discharge.  Dispo: Discharge home today.  The patient does have a current PCP Fayrene Helper, MD) and does not need an [facility] hospital follow-up appointment after discharge.  The patient does not have transportation limitations that hinder transportation to clinic appointments.  .Services Needed at time of discharge: Y = Yes, Blank = No PT:   OT:   RN:   Equipment:   Other:     LOS: 8 days   Joni Reining, DO 09/26/2013, 7:38 AM

## 2013-09-27 NOTE — Telephone Encounter (Signed)
Patient is scheduled for follow up 11/14/13.  She will call me back if she has worsening dysphagia to be worked in with APP if needed.

## 2013-09-27 NOTE — Telephone Encounter (Signed)
Thank you, I appreciate your assistance. -Cindee Salt

## 2013-09-27 NOTE — Telephone Encounter (Signed)
No problem.

## 2013-09-29 LAB — HIV 1/2 CONFIRMATION
HIV 2 AB: NEGATIVE
HIV-1 antibody: NEGATIVE

## 2013-09-29 LAB — HIV ANTIBODY (ROUTINE TESTING W REFLEX): HIV: REACTIVE — AB

## 2013-09-29 LAB — HIV-1 RNA, QUALITATIVE, TMA: HIV-1 RNA, Qualitative, TMA: NOT DETECTED

## 2013-10-03 ENCOUNTER — Encounter: Payer: Self-pay | Admitting: Internal Medicine

## 2013-10-03 MED ORDER — PANTOPRAZOLE SODIUM 40 MG PO TBEC: 40.0000 mg | DELAYED_RELEASE_TABLET | Freq: Every day | ORAL | Status: DC

## 2013-10-09 ENCOUNTER — Encounter: Payer: Self-pay | Admitting: Family Medicine

## 2013-10-09 ENCOUNTER — Ambulatory Visit (INDEPENDENT_AMBULATORY_CARE_PROVIDER_SITE_OTHER): Payer: BC Managed Care – PPO | Admitting: Family Medicine

## 2013-10-09 VITALS — BP 124/84 | HR 73 | Resp 16 | Wt 177.0 lb

## 2013-10-09 DIAGNOSIS — K223 Perforation of esophagus: Secondary | ICD-10-CM

## 2013-10-09 DIAGNOSIS — F3289 Other specified depressive episodes: Secondary | ICD-10-CM

## 2013-10-09 DIAGNOSIS — R5381 Other malaise: Secondary | ICD-10-CM

## 2013-10-09 DIAGNOSIS — E669 Obesity, unspecified: Secondary | ICD-10-CM

## 2013-10-09 DIAGNOSIS — G35 Multiple sclerosis: Secondary | ICD-10-CM

## 2013-10-09 DIAGNOSIS — R5383 Other fatigue: Secondary | ICD-10-CM

## 2013-10-09 DIAGNOSIS — G8929 Other chronic pain: Secondary | ICD-10-CM

## 2013-10-09 DIAGNOSIS — M549 Dorsalgia, unspecified: Secondary | ICD-10-CM

## 2013-10-09 DIAGNOSIS — Z1239 Encounter for other screening for malignant neoplasm of breast: Secondary | ICD-10-CM

## 2013-10-09 DIAGNOSIS — F329 Major depressive disorder, single episode, unspecified: Secondary | ICD-10-CM

## 2013-10-09 DIAGNOSIS — E785 Hyperlipidemia, unspecified: Secondary | ICD-10-CM

## 2013-10-09 DIAGNOSIS — I10 Essential (primary) hypertension: Secondary | ICD-10-CM

## 2013-10-09 LAB — LIPID PANEL
Cholesterol: 289 mg/dL — ABNORMAL HIGH (ref 0–200)
HDL: 34 mg/dL — ABNORMAL LOW (ref >39)
LDL CALC: 202 mg/dL — AB (ref 0–99)
Total CHOL/HDL Ratio: 8.5 Ratio
Triglycerides: 265 mg/dL — ABNORMAL HIGH (ref <150)
VLDL: 53 mg/dL — ABNORMAL HIGH (ref 0–40)

## 2013-10-09 LAB — BASIC METABOLIC PANEL
BUN: 12 mg/dL (ref 6–23)
CO2: 28 mEq/L (ref 19–32)
Calcium: 10.2 mg/dL (ref 8.4–10.5)
Chloride: 101 mEq/L (ref 96–112)
Creat: 1.26 mg/dL — ABNORMAL HIGH (ref 0.50–1.10)
Glucose, Bld: 86 mg/dL (ref 70–99)
Potassium: 4.2 mEq/L (ref 3.5–5.3)
SODIUM: 139 meq/L (ref 135–145)

## 2013-10-09 MED ORDER — ATORVASTATIN CALCIUM 40 MG PO TABS: 40.0000 mg | ORAL_TABLET | Freq: Every day | ORAL | Status: DC

## 2013-10-09 NOTE — Patient Instructions (Addendum)
F/u in 4 month, call if you need me before  You are refered to Dr Jannifer Franklin re MS follow up and for a screening mammogram  I will be in touch with you after I speak directly with Dr Tommy Medal  Pls see if you can have your colonoscopy at the same time as your upper endo, otherwise try and get  It this year   Lipid and chem 7 today

## 2013-10-30 DIAGNOSIS — G8929 Other chronic pain: Secondary | ICD-10-CM | POA: Insufficient documentation

## 2013-10-30 DIAGNOSIS — M549 Dorsalgia, unspecified: Secondary | ICD-10-CM

## 2013-10-30 NOTE — Assessment & Plan Note (Signed)
Controlled, no change in medication DASH diet and commitment to daily physical activity for a minimum of 30 minutes discussed and encouraged, as a part of hypertension management. The importance of attaining a healthy weight is also discussed.  

## 2013-10-30 NOTE — Assessment & Plan Note (Signed)
Controlled and managed by pain specialist/ spine surgeon, Nelva Bush and Rolena Infante

## 2013-10-30 NOTE — Assessment & Plan Note (Signed)
Improved. Pt applauded on succesful weight loss through lifestyle change, and encouraged to continue same. Weight loss goal set for the next several months.  

## 2013-10-30 NOTE — Assessment & Plan Note (Signed)
Controlled, no change in medication  

## 2013-10-30 NOTE — Assessment & Plan Note (Signed)
Pt needs to re establish with neurology.was previously followed by dr Doy Mince She has been off of any medication for over 24 months, and recently had to be hospitalized due to  Food impaction in her esophagus, will refer to neurologist of her choice . Recently in the past 12 months , she did see a neurologist in [place], who negated the dx which she hs carried for years

## 2013-10-30 NOTE — Progress Notes (Signed)
Subjective:    Patient ID: Pamela Walters, female    DOB: 12-05-1960, 53 y.o.   MRN: [id number]  HPI Pt in after a long hiatus, for f/u after recent hospitalization from 5/25 to 06/02, due to esophageal obstruction followed by rupture.She is slowly recovering strength and ability to swallow without discomfort, and was traumatized emotionally by her recent  Hospitalization,caused by choking on food but is truly thankful that she is much better. Back pain is improved, however she has not fully established with neurology re her MS and needs to do so, wants to return to [facility] neurologist now, not fully satisfied with provider she most recently saw, who essentially negated her dx of MS at initial visit Record review from recent hospital stay leaves the question ofHhIV antibody present as an open question, I was able  To speak directly with ID the day of the visit and clarify hat the pt has had NO HIV exposure , and I made her aware of this She needs her colonoscopy, and I have strongly encouraged her to get this at time of rept upper endo, if possible   Review of Systems See HPI Denies recent fever or chills.c/o fatigue Denies sinus pressure, nasal congestion, ear pain or sore throat. Denies chest congestion, productive cough or wheezing. Denies chest pains, palpitations and leg swelling Denies abdominal pain, nausea, vomiting,diarrhea or constipation.   Denies dysuria, frequency, hesitancy or incontinence. Denies uncontrolled joint pain, swelling and limitation in mobility. Denies headaches, seizures, numbness, or tingling. Denies depression, anxiety or insomnia. Denies skin break down or rash.        Objective:   Physical Exam  BP 124/84  Pulse 73  Resp 16  Wt 177 lb (80.287 kg)  SpO2 98% Patient alert and oriented and in no cardiopulmonary distress.  HEENT: No facial asymmetry, EOMI,   oropharynx pink and moist.  Neck supple no JVD, no mass.  Chest: Clear to auscultation  bilaterally.  CVS: S1, S2 no murmurs, no S3.Regular rate.  ABD: Soft non tender.   Ext: No edema  MS: Adequate though reduced  ROM spine, shoulders, hips and knees.  Skin: Intact, no ulcerations or rash noted.  Psych: Good eye contact, normal affect. Memory intact not anxious or depressed appearing.  CNS: CN 2-12 intact, power,  normal throughout.no focal deficits noted.       Assessment & Plan:  HYPERTENSION Controlled, no change in medication DASH diet and commitment to daily physical activity for a minimum of 30 minutes discussed and encouraged, as a part of hypertension management. The importance of attaining a healthy weight is also discussed.   MULTIPLE SCLEROSIS, PROGRESSIVE/RELAPSING Pt needs to re establish with neurology.was previously followed by dr Doy Mince She has been off of any medication for over 24 months, and recently had to be hospitalized due to  Food impaction in her esophagus, will refer to neurologist of her choice . Recently in the past 12 months , she did see a neurologist in [place], who negated the dx which she hs carried for years  Esophageal rupture Required 1 week hospitalization , currently still recovering her strength, but no subsequent complications  HYPERLIPIDEMIA Uncontrolled Hyperlipidemia:Low fat diet discussed and encouraged.  Resume statin  Obesity, unspecified Improved. Pt applauded on succesful weight loss through lifestyle change, and encouraged to continue same. Weight loss goal set for the next several months.   FATIGUE Ongoing fatigue esp with recent hospitalization Hopefully , with treatment of MS this symptom will also  improve  DEPRESSION Controlled, no change in medication   Chronic back pain greater than 3 months duration Controlled and managed by pain specialist/ spine surgeon, Nelva Bush and Rolena Infante

## 2013-10-30 NOTE — Assessment & Plan Note (Signed)
Uncontrolled Hyperlipidemia:Low fat diet discussed and encouraged.  Resume statin

## 2013-10-30 NOTE — Assessment & Plan Note (Signed)
Required 1 week hospitalization , currently still recovering her strength, but no subsequent complications

## 2013-10-30 NOTE — Assessment & Plan Note (Signed)
Ongoing fatigue esp with recent hospitalization Hopefully , with treatment of MS this symptom will also improve

## 2013-11-07 ENCOUNTER — Telehealth: Payer: Self-pay | Admitting: Neurology

## 2013-11-07 ENCOUNTER — Ambulatory Visit: Payer: BC Managed Care – PPO | Admitting: Neurology

## 2013-11-07 NOTE — Telephone Encounter (Signed)
This patient did not show for a new patient appointment today. 

## 2013-11-14 ENCOUNTER — Ambulatory Visit (INDEPENDENT_AMBULATORY_CARE_PROVIDER_SITE_OTHER): Payer: BC Managed Care – PPO | Admitting: Internal Medicine

## 2013-11-14 ENCOUNTER — Encounter: Payer: Self-pay | Admitting: Internal Medicine

## 2013-11-14 VITALS — BP 142/88 | HR 76 | Ht 63.0 in | Wt 179.2 lb

## 2013-11-14 DIAGNOSIS — Z1211 Encounter for screening for malignant neoplasm of colon: Secondary | ICD-10-CM

## 2013-11-14 DIAGNOSIS — T18108A Unspecified foreign body in esophagus causing other injury, initial encounter: Secondary | ICD-10-CM

## 2013-11-14 DIAGNOSIS — K222 Esophageal obstruction: Secondary | ICD-10-CM

## 2013-11-14 DIAGNOSIS — Q391 Atresia of esophagus with tracheo-esophageal fistula: Secondary | ICD-10-CM

## 2013-11-14 DIAGNOSIS — T18128D Food in esophagus causing other injury, subsequent encounter: Secondary | ICD-10-CM

## 2013-11-14 DIAGNOSIS — R1319 Other dysphagia: Secondary | ICD-10-CM

## 2013-11-14 DIAGNOSIS — Z5189 Encounter for other specified aftercare: Secondary | ICD-10-CM

## 2013-11-14 DIAGNOSIS — K223 Perforation of esophagus: Secondary | ICD-10-CM

## 2013-11-14 DIAGNOSIS — Q393 Congenital stenosis and stricture of esophagus: Secondary | ICD-10-CM

## 2013-11-14 MED ORDER — PANTOPRAZOLE SODIUM 40 MG PO TBEC: 40.0000 mg | DELAYED_RELEASE_TABLET | Freq: Every day | ORAL | Status: DC

## 2013-11-14 NOTE — Patient Instructions (Signed)
You have been scheduled for a Barium Esophogram at [facility] (1st floor of the hospital) on 11/20/13 at 11:00am. Please arrive 15 minutes prior to your appointment for registration. Make certain not to have anything to eat or drink 6 hours prior to your test. If you need to reschedule for any reason, please contact radiology at [phone] to do so. __________________________________________________________________ A barium swallow is an examination that concentrates on views of the esophagus. This tends to be a double contrast exam (barium and two liquids which, when combined, create a gas to distend the wall of the oesophagus) or single contrast (non-ionic iodine based). The study is usually tailored to your symptoms so a good history is essential. Attention is paid during the study to the form, structure and configuration of the esophagus, looking for functional disorders (such as aspiration, dysphagia, achalasia, motility and reflux) EXAMINATION You may be asked to change into a gown, depending on the type of swallow being performed. A radiologist and radiographer will perform the procedure. The radiologist will advise you of the type of contrast selected for your procedure and direct you during the exam. You will be asked to stand, sit or lie in several different positions and to hold a small amount of fluid in your mouth before being asked to swallow while the imaging is performed .In some instances you may be asked to swallow barium coated marshmallows to assess the motility of a solid food bolus. The exam can be recorded as a digital or video fluoroscopy procedure. POST PROCEDURE It will take 1-2 days for the barium to pass through your system. To facilitate this, it is important, unless otherwise directed, to increase your fluids for the next 24-48hrs and to resume your normal diet.  This test typically takes about 30 minutes to  perform. __________________________________________________________________________________  Pamela Walters have been scheduled for an endoscopy. Please follow written instructions given to you at your visit today. If you use inhalers (even only as needed), please bring them with you on the day of your procedure. Your physician has requested that you go to www.startemmi.com and enter the access code given to you at your visit today. This web site gives a general overview about your procedure. However, you should still follow specific instructions given to you by our office regarding your preparation for the procedure.  cc: [name], MD .

## 2013-11-14 NOTE — Progress Notes (Signed)
Patient ID: Pamela Walters, female   DOB: 26-Apr-1961, 53 y.o.   MRN: [id number] HPI: Pamela Walters is a 53 year old female with a recent history of esophageal food impaction complicated by perforation treated conservatively, Schatzki's ring, hypertension, hyperlipidemia, MS who is seen in hospital followup. She presented to the hospital on 09/18/2013 with esophageal food impaction and acute dysphagia. Symptoms have been present for greater than 24 hours. She had an upper endoscopy for foreign body/meat impaction removal. When the food was removed using rat-tooth forceps and banding A mucosal tear was seen in the esophagus at 30 cm. The exam was also notable for a 4 cm hiatal hernia and a Schatzki's ring at 34 cm. Several small fundic gland-appearing polyps are seen in the gastric cardia and fundus but the stomach was otherwise unremarkable. The examined duodenum was normal. Chest x-ray following procedure revealed mediastinal air. She was admitted and treated with IV antibiotics and TPN for 7 days. Thoracic surgery followed but no further intervention was necessary. Diet was slowly advanced and barium swallows continued to show no esophageal leak.  Today she reports she is feeling well she has been avoiding breads and meats and has been quite nervous to eat solid foods. She reports she is swallowing well but on occasion when she eats foods such as bread she does still have esophageal dysphagia. She has noticed some indigestion but this has been improved with pantoprazole. Occasionally she feels a soreness in her mid chest when she swallows. She feels that this is the exact spot of the food impaction. She denies nausea or vomiting. She has returned to work. She reports normal bowel movements without blood in her stool or melena. No abdominal pain. She has never had a colonoscopy and reports she knows she needs one.  She reports Dr. Moshe Cipro has also recommended this test.  Past Medical History  Diagnosis Date   . Hypertension   . Hyperlipidemia   . MS (multiple sclerosis)     2007  . Back pain   . Hiatal hernia   . Schatzki's ring     Past Surgical History  Procedure Laterality Date  . Abdominal hysterectomy  2005    fibroids  . Esophagogastroduodenoscopy N/A 09/18/2013    Procedure: ESOPHAGOGASTRODUODENOSCOPY (EGD);  Surgeon: Jerene Bears, MD;  Location: [facility];  Service: Gastroenterology;  Laterality: N/A;    Outpatient Prescriptions Prior to Visit  Medication Sig Dispense Refill  . atorvastatin (LIPITOR) 40 MG tablet Take 1 tablet (40 mg total) by mouth daily.  90 tablet  1  . FLUoxetine (PROZAC) 40 MG capsule Take 1 capsule (40 mg total) by mouth daily.  90 capsule  1  . HYDROcodone-acetaminophen (NORCO/VICODIN) 5-325 MG per tablet Take 1 tablet by mouth daily as needed (pain).       . Multiple Vitamin (MULTIVITAMIN WITH MINERALS) TABS tablet Take 1 tablet by mouth daily.      Marland Kitchen tiZANidine (ZANAFLEX) 4 MG capsule Take 4 mg by mouth 2 (two) times daily as needed (back spasms).       . triamterene-hydrochlorothiazide (MAXZIDE-25) 37.5-25 MG per tablet Take 1 tablet by mouth daily.  90 tablet  1  . pantoprazole (PROTONIX) 40 MG tablet Take 1 tablet (40 mg total) by mouth daily.  30 tablet  1   No facility-administered medications prior to visit.    No Known Allergies  Family History  Problem Relation Age of Onset  . Hypertension Mother   . Hyperlipidemia Mother   . Hypertension  Father     History  Substance Use Topics  . Smoking status: Never Smoker   . Smokeless tobacco: Never Used  . Alcohol Use: No    ROS: As per history of present illness, otherwise negative  BP 142/88  Pulse 76  Ht 5\' 3"  (1.6 m)  Wt 179 lb 3.2 oz (81.285 kg)  BMI 31.75 kg/m2 Constitutional: Well-developed and well-nourished. No distress. HEENT: Normocephalic and atraumatic. Oropharynx is clear and moist. No oropharyngeal exudate. Conjunctivae are normal.  No scleral icterus. Neck: Neck  supple. Trachea midline. Cardiovascular: Normal rate, regular rhythm and intact distal pulses. No M/R/G Pulmonary/chest: Effort normal and breath sounds normal. No wheezing, rales or rhonchi. Abdominal: Soft, nontender, nondistended. Bowel sounds active throughout.  Extremities: no clubbing, cyanosis, or edema Lymphadenopathy: No cervical adenopathy noted. Neurological: Alert and oriented to person place and time. Skin: Skin is warm and dry. No rashes noted. Psychiatric: Normal mood and affect. Behavior is normal.  RELEVANT LABS AND IMAGING: CBC    Component Value Date/Time   WBC 8.9 09/25/2013 0430   RBC 4.24 09/25/2013 0430   HGB 11.2* 09/25/2013 0430   HCT 34.6* 09/25/2013 0430   PLT 287 09/25/2013 0430   MCV 81.6 09/25/2013 0430   MCH 26.4 09/25/2013 0430   MCHC 32.4 09/25/2013 0430   RDW 16.4* 09/25/2013 0430   LYMPHSABS 1.4 09/20/2013 0500   MONOABS 0.7 09/20/2013 0500   EOSABS 0.1 09/20/2013 0500   BASOSABS 0.0 09/20/2013 0500    CMP     Component Value Date/Time   NA 139 10/09/2013 0923   K 4.2 10/09/2013 0923   CL 101 10/09/2013 0923   CO2 28 10/09/2013 0923   GLUCOSE 86 10/09/2013 0923   BUN 12 10/09/2013 0923   CREATININE 1.26* 10/09/2013 0923   CREATININE 0.83 09/25/2013 0430   CALCIUM 10.2 10/09/2013 0923   PROT 6.8 09/25/2013 0430   ALBUMIN 3.2* 09/25/2013 0430   AST 19 09/25/2013 0430   ALT 19 09/25/2013 0430   ALKPHOS 57 09/25/2013 0430   BILITOT 0.2* 09/25/2013 0430   GFRNONAA 78* 09/25/2013 0430   GFRAA >90 09/25/2013 0430    ASSESSMENT/PLAN: 53 year old female with a recent history of esophageal food impaction complicated by perforation treated conservatively, Schatzki's ring, hypertension, hyperlipidemia, MS who is seen in hospital followup.   1. Schatzki's ring/history of food impaction complicated by esophageal perforation -- her esophageal perforation healed with conservative management. I would like to document healing with a repeat barium swallow with tablet. She continues to have  solid food dysphagia and this likely is secondary to her known Schatzki's ring. This has never been dilated. She would like to have a dilation performed to improve her swallowing. We discussed this test including the risks and benefits and she is agreeable to proceed. This will be scheduled after the barium swallow with tablet so that that test can be reviewed first. I recommended she continue pantoprazole 40 mg once daily for now.  2. CRC screening -- average risk, never had colonoscopy. Colonoscopy is recommended. She would like he had her upper endoscopy with dilation performed first before colonoscopy. I encouraged her to schedule her colonoscopy soon. I will put a reminder in for September to contact her again regarding this screening test.

## 2013-11-17 ENCOUNTER — Encounter: Payer: Self-pay | Admitting: Internal Medicine

## 2013-11-20 ENCOUNTER — Ambulatory Visit (HOSPITAL_COMMUNITY)
Admission: RE | Admit: 2013-11-20 | Discharge: 2013-11-20 | Disposition: A | Discharge status: Home / Self Care | Payer: BC Managed Care – PPO | Source: Ambulatory Visit | Attending: Internal Medicine | Admitting: Internal Medicine

## 2013-11-20 DIAGNOSIS — K222 Esophageal obstruction: Secondary | ICD-10-CM | POA: Insufficient documentation

## 2013-11-20 DIAGNOSIS — R1319 Other dysphagia: Secondary | ICD-10-CM

## 2013-11-20 DIAGNOSIS — K219 Gastro-esophageal reflux disease without esophagitis: Secondary | ICD-10-CM | POA: Insufficient documentation

## 2013-11-20 DIAGNOSIS — K449 Diaphragmatic hernia without obstruction or gangrene: Secondary | ICD-10-CM | POA: Insufficient documentation

## 2013-11-20 DIAGNOSIS — R1310 Dysphagia, unspecified: Secondary | ICD-10-CM | POA: Insufficient documentation

## 2013-11-20 DIAGNOSIS — K223 Perforation of esophagus: Secondary | ICD-10-CM

## 2013-11-23 ENCOUNTER — Ambulatory Visit (HOSPITAL_COMMUNITY): Payer: BC Managed Care – PPO

## 2013-12-06 ENCOUNTER — Ambulatory Visit (HOSPITAL_COMMUNITY)
Admission: RE | Admit: 2013-12-06 | Discharge: 2013-12-06 | Disposition: A | Discharge status: Home / Self Care | Payer: BC Managed Care – PPO | Source: Ambulatory Visit | Attending: Family Medicine | Admitting: Family Medicine

## 2013-12-06 DIAGNOSIS — Z1231 Encounter for screening mammogram for malignant neoplasm of breast: Secondary | ICD-10-CM | POA: Diagnosis present

## 2013-12-06 DIAGNOSIS — Z1239 Encounter for other screening for malignant neoplasm of breast: Secondary | ICD-10-CM

## 2013-12-18 ENCOUNTER — Ambulatory Visit (AMBULATORY_SURGERY_CENTER): Payer: BC Managed Care – PPO | Admitting: Internal Medicine

## 2013-12-18 ENCOUNTER — Other Ambulatory Visit: Payer: Self-pay | Admitting: Internal Medicine

## 2013-12-18 ENCOUNTER — Encounter: Payer: Self-pay | Admitting: Internal Medicine

## 2013-12-18 VITALS — BP 125/74 | HR 67 | Temp 98.1°F | Resp 20 | Ht 63.0 in | Wt 179.0 lb

## 2013-12-18 DIAGNOSIS — K2960 Other gastritis without bleeding: Secondary | ICD-10-CM

## 2013-12-18 DIAGNOSIS — Q393 Congenital stenosis and stricture of esophagus: Secondary | ICD-10-CM

## 2013-12-18 DIAGNOSIS — K222 Esophageal obstruction: Secondary | ICD-10-CM

## 2013-12-18 DIAGNOSIS — Q391 Atresia of esophagus with tracheo-esophageal fistula: Secondary | ICD-10-CM

## 2013-12-18 DIAGNOSIS — K2990 Gastroduodenitis, unspecified, without bleeding: Secondary | ICD-10-CM

## 2013-12-18 DIAGNOSIS — R1319 Other dysphagia: Secondary | ICD-10-CM

## 2013-12-18 DIAGNOSIS — K2970 Gastritis, unspecified, without bleeding: Secondary | ICD-10-CM

## 2013-12-18 MED ORDER — SODIUM CHLORIDE 0.9 % IV SOLN: 500.0000 mL | INTRAVENOUS | Status: DC

## 2013-12-18 NOTE — Progress Notes (Signed)
Report to PACU, RN, vss, BBS= Clear.  

## 2013-12-18 NOTE — Progress Notes (Signed)
Called to room to assist during endoscopic procedure.  Patient ID and intended procedure confirmed with present staff. Received instructions for my participation in the procedure from the performing physician.  

## 2013-12-18 NOTE — Op Note (Signed)
[facility]  [address] [name], [id number]   ENDOSCOPY PROCEDURE REPORT  PATIENT: Pamela Walters, Pamela Walters  MR#: [id number] BIRTHDATE: Mar 10, 1961 , 53  yrs. old GENDER: Female ENDOSCOPIST: Jerene Bears, MD PROCEDURE DATE:  12/18/2013 PROCEDURE:  EGD w/ biopsy; EGD with balloon dilation ASA CLASS:     Class III INDICATIONS:  Dysphagia.   History of esophageal food impaction, history of Schatzki's ring, history of esophageal perforation medically managed May 2015. MEDICATIONS: MAC sedation, administered by CRNA and propofol (Diprivan) 280mg  IV TOPICAL ANESTHETIC: none  DESCRIPTION OF PROCEDURE: After the risks benefits and alternatives of the procedure were thoroughly explained, informed consent was obtained.  The LB [id number] endoscope was introduced through the mouth and advanced to the second portion of the duodenum. Without limitations.  The instrument was slowly withdrawn as the mucosa was fully examined.     ESOPHAGUS: There was no evidence of esophageal mucosal abnormality or residual mucosal tear seen.  A Schatzki ring was found 36 cm from the incisors.  Small hiatal hernia.  The esophagus was otherwise normal.   Balloon dilation was performed across the Schatzki's ring using TTS balloon to 12 mm. With the 12 mm balloon size there was no resistance and no evidence of ring disruption, thus the balloon was then inflated to 13.5 mm and held for 1 minute.  Post-dilation appearance after the 13.5 mm balloon also suggested no evidence of ring disruption. Next the balloon was inflated to 15 mm and held in place for 1 minute. After the 15 mm dilation there was evidence of superficial ring disruption.  No evidence for bleeding or complication.  STOMACH: There was mild, striped, antral gastropathy noted.  Cold forcep biopsies were taken at the antrum and angularis.   Small sessile polyps were again found in the gastric fundus and gastric body consistent  with fundic gland polyps.  Multiple biopsies were performed using cold forceps.  DUODENUM: The duodenal mucosa showed no abnormalities in the bulb and second portion of the duodenum.  Retroflexed views revealed a hiatal hernia.     The scope was then withdrawn from the patient and the procedure completed.  COMPLICATIONS: There were no complications.  ENDOSCOPIC IMPRESSION: 1.   Schatzki ring was found 36 cm from the incisors; balloon dilation to maximum of 15 mm 2.   The esophagus was otherwise normal. 3.   There was mild antral gastropathy noted; multiple biopsies 4.   Multiple small sessile polyps were found in the gastric fundus and gastric body; biopsy 5.   The duodenal mucosa showed no abnormalities in the bulb and second portion of the duodenum  RECOMMENDATIONS: 1.  Await biopsy results 2.  Continue taking your PPI (antiacid medicine) once daily.  It is best to be taken 20-30 minutes prior to breakfast meal. 3.  Office follow-up next available 4.  Liquid diet for next 2 hours, soft diet remainder of today, then advance tomorrow as tolerated  eSigned:  Jerene Bears, MD 12/18/2013 3:19 PM   CC:The Patient and [name], MD  PATIENT NAME:  Pamela Walters, Pamela Walters MR#: [id number]

## 2013-12-18 NOTE — Patient Instructions (Signed)
YOU HAD AN ENDOSCOPIC PROCEDURE TODAY AT [facility]: Refer to the procedure report that was given to you for any specific questions about what was found during the examination.  If the procedure report does not answer your questions, please call your gastroenterologist to clarify.  If you requested that your care partner not be given the details of your procedure findings, then the procedure report has been included in a sealed envelope for you to review at your convenience later.  YOU SHOULD EXPECT: Some feelings of bloating in the abdomen. Passage of more gas than usual.  Walking can help get rid of the air that was put into your GI tract during the procedure and reduce the bloating. If you had a lower endoscopy (such as a colonoscopy or flexible sigmoidoscopy) you may notice spotting of blood in your stool or on the toilet paper. If you underwent a bowel prep for your procedure, then you may not have a normal bowel movement for a few days.  DIET: Your first meal following the procedure should be a light meal and then it is ok to progress to your normal diet.  A half-sandwich or bowl of soup is an example of a good first meal.  Heavy or fried foods are harder to digest and may make you feel nauseous or bloated.  Likewise meals heavy in dairy and vegetables can cause extra gas to form and this can also increase the bloating.  Drink plenty of fluids but you should avoid alcoholic beverages for 24 hours.  ACTIVITY: Your care partner should take you home directly after the procedure.  You should plan to take it easy, moving slowly for the rest of the day.  You can resume normal activity the day after the procedure however you should NOT DRIVE or use heavy machinery for 24 hours (because of the sedation medicines used during the test).    SYMPTOMS TO REPORT IMMEDIATELY: A gastroenterologist can be reached at any hour.  During normal business hours, 8:30 AM to 5:00 PM Monday through Friday,  call [phone].  After hours and on weekends, please call the GI answering service at [phone] who will take a message and have the physician on call contact you.     Following upper endoscopy (EGD)  Vomiting of blood or coffee ground material  New chest pain or pain under the shoulder blades  Painful or persistently difficult swallowing  New shortness of breath  Fever of 100F or higher  Black, tarry-looking stools  FOLLOW UP: If any biopsies were taken you will be contacted by phone or by letter within the next 1-3 weeks.  Call your gastroenterologist if you have not heard about the biopsies in 3 weeks.  Our staff will call the home number listed on your records the next business day following your procedure to check on you and address any questions or concerns that you may have at that time regarding the information given to you following your procedure. This is a courtesy call and so if there is no answer at the home number and we have not heard from you through the emergency physician on call, we will assume that you have returned to your regular daily activities without incident.   Continue prilosec once daily, best 30 minutes before breakfast.  Await biopsy results.  Clear liquids until 5:30PM, then soft diet remainder of today-handout given.  Next available follow-up with Dr.Pyrtle.  SIGNATURES/CONFIDENTIALITY: You and/or your care partner have signed paperwork  which will be entered into your electronic medical record.  These signatures attest to the fact that that the information above on your After Visit Summary has been reviewed and is understood.  Full responsibility of the confidentiality of this discharge information lies with you and/or your care-partner.

## 2013-12-19 ENCOUNTER — Telehealth: Payer: Self-pay | Admitting: *Deleted

## 2013-12-19 NOTE — Telephone Encounter (Signed)
  Follow up Call-  Call back number 12/18/2013  Post procedure Call Back phone  # [phone]  Permission to leave phone message Yes     Patient questions:  Do you have a fever, pain , or abdominal swelling? No. Pain Score  0 *  Have you tolerated food without any problems? Yes.    Have you been able to return to your normal activities? Yes.    Do you have any questions about your discharge instructions: Diet   No. Medications  No. Follow up visit  No.  Do you have questions or concerns about your Care? No.  Actions: * If pain score is 4 or above: No action needed, pain <4.

## 2013-12-26 ENCOUNTER — Encounter: Payer: Self-pay | Admitting: Internal Medicine

## 2014-01-03 ENCOUNTER — Encounter: Payer: Self-pay | Admitting: Internal Medicine

## 2014-02-07 LAB — COMPLETE METABOLIC PANEL WITH GFR
ALBUMIN: 4.2 g/dL (ref 3.5–5.2)
ALT: 15 U/L (ref 0–35)
AST: 17 U/L (ref 0–37)
Alkaline Phosphatase: 73 U/L (ref 39–117)
BILIRUBIN TOTAL: 0.6 mg/dL (ref 0.2–1.2)
BUN: 13 mg/dL (ref 6–23)
CO2: 32 mEq/L (ref 19–32)
Calcium: 9.7 mg/dL (ref 8.4–10.5)
Chloride: 101 mEq/L (ref 96–112)
Creat: 0.98 mg/dL (ref 0.50–1.10)
GFR, Est African American: 75 mL/min
GFR, Est Non African American: 65 mL/min
GLUCOSE: 64 mg/dL — AB (ref 70–99)
POTASSIUM: 3.6 meq/L (ref 3.5–5.3)
SODIUM: 141 meq/L (ref 135–145)
TOTAL PROTEIN: 7.1 g/dL (ref 6.0–8.3)

## 2014-02-07 LAB — LIPID PANEL
CHOL/HDL RATIO: 3.7 ratio
Cholesterol: 146 mg/dL (ref 0–200)
HDL: 39 mg/dL — AB (ref >39)
LDL Cholesterol: 65 mg/dL (ref 0–99)
Triglycerides: 209 mg/dL — ABNORMAL HIGH (ref <150)
VLDL: 42 mg/dL — AB (ref 0–40)

## 2014-02-08 ENCOUNTER — Ambulatory Visit (INDEPENDENT_AMBULATORY_CARE_PROVIDER_SITE_OTHER): Payer: BC Managed Care – PPO | Admitting: Family Medicine

## 2014-02-08 ENCOUNTER — Encounter: Payer: Self-pay | Admitting: Family Medicine

## 2014-02-08 VITALS — BP 130/82 | HR 80 | Resp 18 | Ht 63.0 in | Wt 177.1 lb

## 2014-02-08 DIAGNOSIS — I10 Essential (primary) hypertension: Secondary | ICD-10-CM

## 2014-02-08 DIAGNOSIS — F329 Major depressive disorder, single episode, unspecified: Secondary | ICD-10-CM

## 2014-02-08 DIAGNOSIS — G35 Multiple sclerosis: Secondary | ICD-10-CM

## 2014-02-08 DIAGNOSIS — E785 Hyperlipidemia, unspecified: Secondary | ICD-10-CM

## 2014-02-08 DIAGNOSIS — F32A Depression, unspecified: Secondary | ICD-10-CM

## 2014-02-08 DIAGNOSIS — F419 Anxiety disorder, unspecified: Principal | ICD-10-CM

## 2014-02-08 DIAGNOSIS — F418 Other specified anxiety disorders: Secondary | ICD-10-CM

## 2014-02-08 DIAGNOSIS — M549 Dorsalgia, unspecified: Secondary | ICD-10-CM

## 2014-02-08 NOTE — Patient Instructions (Addendum)
F/u in 4 month, call if you need me before  Congrats on your labs  You are referred for therapy and to the psychiatrist also.  Things will improve  Pls reschedule with Dr Jannifer Franklin

## 2014-02-09 NOTE — Assessment & Plan Note (Signed)
Marked deterioration following recent family stress, pt not suicidal or homicidal , but severely depressed. Feels 'the lowest she ever has" Refer both for therapy and to psychiatrist, soonest available, she is on max dose of prozac with a PHQ 9 score of 20

## 2014-02-09 NOTE — Assessment & Plan Note (Signed)
Improved significantly Hyperlipidemia:Low fat diet discussed and encouraged.  No change in med

## 2014-02-09 NOTE — Assessment & Plan Note (Signed)
Controlled, no change in medication DASH diet and commitment to daily physical activity for a minimum of 30 minutes discussed and encouraged, as a part of hypertension management. The importance of attaining a healthy weight is also discussed.  

## 2014-02-09 NOTE — Assessment & Plan Note (Signed)
Marked improvement following epidural injection

## 2014-02-09 NOTE — Progress Notes (Signed)
   Subjective:    Patient ID: Pamela Walters, female    DOB: [birth year], 53 y.o.   MRN: [id number]  HPI The PT is here for follow up and re-evaluation of chronic medical conditions, medication management and review of any available recent lab and radiology data.  Preventive health is updated, specifically  Cancer screening and Immunization.  Still needs colonoscopy Questions or concerns regarding consultations or procedures which the PT has had in the interim are  Addressed.Happy with swallowing. Back pain is better following epidural The PT denies any adverse reactions to current medications since the last visit.  Increased stress, depression and anxiety in past 2.5 to 3 weeks due tpo worsening family dynamics, wants help, not suicidal or homicidal , but feels her lowest       Review of Systems See HPI Denies recent fever or chills. Denies sinus pressure, nasal congestion, ear pain or sore throat. Denies chest congestion, productive cough or wheezing. Denies chest pains, palpitations and leg swelling Denies abdominal pain, nausea, vomiting,diarrhea or constipation.   Denies dysuria, frequency, hesitancy or incontinence. Denies headaches, seizures, numbness, or tingling.  Denies skin break down or rash.        Objective:   Physical Exam  BP 130/82  Pulse 80  Resp 18  Ht 5\' 3"  (1.6 m)  Wt 177 lb 1.9 oz (80.341 kg)  BMI 31.38 kg/m2  SpO2 96% Patient alert and oriented and in no cardiopulmonary distress.  HEENT: No facial asymmetry, EOMI,   oropharynx pink and moist.  Neck supple no JVD, no mass.  Chest: Clear to auscultation bilaterally.  CVS: S1, S2 no murmurs, no S3.Regular rate.  ABD: Soft non tender.   Ext: No edema  MS: Adequate ROM spine, shoulders, hips and knees.  Skin: Intact, no ulcerations or rash noted.  Psych: Good eye contact, t. Memory intact not anxious , tearful and depressed when discussing family problems, not suicidal or homicidal, feels  ho[peless.  CNS: CN 2-12 intact, power,  normal throughout.no focal deficits noted.       Assessment & Plan:  Essential hypertension Controlled, no change in medication DASH diet and commitment to daily physical activity for a minimum of 30 minutes discussed and encouraged, as a part of hypertension management. The importance of attaining a healthy weight is also discussed.   Back pain with radiation Marked improvement following epidural injection  Depression with anxiety Marked deterioration following recent family stress, pt not suicidal or homicidal , but severely depressed. Feels 'the lowest she ever has" Refer both for therapy and to psychiatrist, soonest available, she is on max dose of prozac with a PHQ 9 score of 20  Hyperlipidemia Improved significantly Hyperlipidemia:Low fat diet discussed and encouraged.  No change in med  MULTIPLE SCLEROSIS, PROGRESSIVE/RELAPSING Still needs an appt with neurology for re evaluation , will reschedule

## 2014-02-09 NOTE — Assessment & Plan Note (Signed)
Still needs an appt with neurology for re evaluation , will reschedule

## 2014-02-13 ENCOUNTER — Ambulatory Visit (INDEPENDENT_AMBULATORY_CARE_PROVIDER_SITE_OTHER): Payer: BC Managed Care – PPO | Admitting: Internal Medicine

## 2014-02-13 ENCOUNTER — Encounter: Payer: Self-pay | Admitting: Internal Medicine

## 2014-02-13 VITALS — BP 132/66 | HR 88 | Ht 63.0 in | Wt 180.4 lb

## 2014-02-13 DIAGNOSIS — K222 Esophageal obstruction: Secondary | ICD-10-CM

## 2014-02-13 DIAGNOSIS — Z1211 Encounter for screening for malignant neoplasm of colon: Secondary | ICD-10-CM

## 2014-02-13 DIAGNOSIS — Q394 Esophageal web: Secondary | ICD-10-CM

## 2014-02-13 DIAGNOSIS — R1310 Dysphagia, unspecified: Secondary | ICD-10-CM

## 2014-02-13 NOTE — Patient Instructions (Signed)
You have been scheduled for an endoscopy. Please follow written instructions given to you at your visit today. If you use inhalers (even only as needed), please bring them with you on the day of your procedure. Your physician has requested that you go to www.startemmi.com and enter the access code given to you at your visit today. This web site gives a general overview about your procedure. However, you should still follow specific instructions given to you by our office regarding your preparation for the procedure.   You will be contacted by [employer] regarding the Cologuard

## 2014-02-13 NOTE — Progress Notes (Signed)
   Subjective:    Patient ID: Pamela Walters, female    DOB: 11/03/1960, 53 y.o.   MRN: [id number]  HPI [name] is a 53 year old female with a past medical history of esophageal food impaction secondary to Schatzki's ring with esophageal perforation at the time of food impaction, subsequent esophageal dilation to 15 mm with balloon and performed on 12/18/2013 who is seen for followup. She is here alone today. She reports she did very well after balloon dilation of her esophagus and has noted improvement in her dysphagia. She still is "nervous" to eat foods like chicken and steak but she has noticed improvement with breads and dry foods such as rice.  She reports she is still chewing her food extremely well and eating small bites often eats. She would prefer to try esophageal dilation again to further improve her solid food dysphagia, particularly for meats. She denies heartburn and is taking pantoprazole 40 mg daily. No abdominal pain. No nausea or vomiting. We again discussed screening colonoscopy and she remains nervous which she says she knows is "silly". No change in her bowel habit, no blood in her stool or melena   Review of Systems As per history of present illness, otherwise negative  Current Medications, Allergies, Past Medical History, Past Surgical History, Family History and Social History were reviewed in [employer] record.      Objective:   Physical Exam BP 132/66  Pulse 88  Ht 5\' 3"  (1.6 m)  Wt 180 lb 6 oz (81.818 kg)  BMI 31.96 kg/m2 Constitutional: Well-developed and well-nourished. No distress. HEENT: Normocephalic and atraumatic.   No scleral icterus. Neck: Neck supple. Trachea midline. Cardiovascular: Normal rate, regular rhythm and intact distal pulses.  Pulmonary/chest: Effort normal and breath sounds normal. No wheezing, rales or rhonchi. Abdominal: Soft, nontender, nondistended. Bowel sounds active throughout.  Extremities: no  clubbing, cyanosis, or edema Neurological: Alert and oriented to person place and time. Psychiatric: Normal mood and affect. Behavior is normal.  EGD reviewed including with the patient    Assessment & Plan:  53 year old female with a past medical history of esophageal food impaction secondary to Schatzki's ring with esophageal perforation at the time of food impaction, subsequent esophageal dilation to 15 mm with balloon and performed on 12/18/2013 who is seen for followup  1. Schatzki's ring with improved but persistent solid food dysphagia -- swallowing symptoms improved significantly after dilation but persist for meats such as steak and chicken. We discussed repeat upper endoscopy for repeat dilation. This would likely result in improved overall symptoms. I would like her to continue pantoprazole 40 mg daily. After discussion of repeat upper endoscopy, including the risks and benefits she understands and agrees to proceed  2. Colorectal cancer screening -- long discussion today regarding colonoscopy for screening. She remains very nervous and hesitant about this test for unclear reasons. I told her that the best screening test for her is the one that she would do. With that in mind we discussed and she is agreeable to Cologuard. The test will be ordered and mailed to her home. She understands that if positive she will need colonoscopy and she agrees to proceed with colonoscopy in the event the test is positive.

## 2014-02-19 ENCOUNTER — Encounter: Payer: Self-pay | Admitting: Internal Medicine

## 2014-03-21 ENCOUNTER — Encounter (HOSPITAL_COMMUNITY): Payer: Self-pay | Admitting: Psychology

## 2014-03-21 ENCOUNTER — Ambulatory Visit (INDEPENDENT_AMBULATORY_CARE_PROVIDER_SITE_OTHER): Payer: BC Managed Care – PPO | Admitting: Psychology

## 2014-03-21 DIAGNOSIS — F331 Major depressive disorder, recurrent, moderate: Secondary | ICD-10-CM

## 2014-03-21 NOTE — Progress Notes (Signed)
Patient:   Pamela Walters   DOB:   [birth year]  MR Number:  [id number]  Location:  [facility] [address] [place] [address] Dept: [phone]           Date of Service:   03/21/2014  Start Time:   10 AM End Time:   11 5 AM  Provider/Observer:  Edgardo Roys PSYD       Billing Code/Service: [phone]  Chief Complaint:     Chief Complaint  Patient presents with  . Depression  . Stress    Reason for Service:  The patient is a 53 year old Caucasian female who was referred by Dr. Moshe Cipro for psychological/psychotherapeutic and psychiatric interventions. The patient has been dealing with depressive symptoms for quite some time but family stress and other issues have made it more significant recently. Dr. Moshe Cipro recently started her on Prozac but felt that psychotherapeutic interventions and further psychiatric interventions was warranted. The patient reports that her major family stressors right now have to do with her 64 year old daughter who has 4 children. The patient has raised the 2 oldest grandchildren since they were very young and the youngest of these grandchildren has had difficulties recently and been in [employer] after a suicide attempt. The patient reports that she feels like she is fighting about every day and has been doing so for years. The patient reports that she always feels like she should fix things were everyone else and does not typically take care of herself. The patient reports that she feels like she has no energy and just can't do anything for herself.  Complicating the patient's symptoms is a diagnosis 7 years ago of multiple sclerosis. She reports that a recent study suggested that it was slowly progressing.  Current Status:  The patient describes moderate to significant symptoms of anxiety, depression, mood changes, racing thoughts, cognitive difficulties  including confusion and memory problems, loss of interest, irritability, excessive worrying, low energy, panic events, and poor concentration.  Reliability of Information: The patient provided the information specifically with the review of available medical records. The patient has been followed by Dr. Moshe Cipro and these records are available in her [facility] chart.  Behavioral Observation: Pamela Walters  presents as a 53 y.o.-year-old Right Caucasian Female who appeared her stated age. her dress was Appropriate and she was Well Groomed and her manners were Appropriate to the situation.  There were not any physical disabilities noted.  she displayed an appropriate level of cooperation and motivation.    Interactions:    Active   Attention:   within normal limits  Memory:   within normal limits  Visuo-spatial:   within normal limits  Speech (Volume):  low  Speech:   normal pitch  Thought Process:  Coherent  Though Content:  WNL  Orientation:   person, place, time/date and situation  Judgment:   Good  Planning:   Good  Affect:    Depressed  Mood:    Anxious and Depressed  Insight:   Present  Intelligence:   normal  Marital Status/Living: The patient was born in [place] and grew up [place]. Both of her parents are deceased. The patient is married and has a 58 year old daughter and a 55 year old son. Her 27 year old daughter has a long history of difficulties and became pregnant when her daughter was 40 years old and the patient has raised the 2 youngest  of 4 children that the daughter is had. The patient is getting ready to be a great grandmother soon.    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  Past Employment:    Substance Use:  No concerns of substance abuse are reported.    Education:   College  Medical History:   Past Medical History  Diagnosis  Date  . Hypertension   . Hyperlipidemia   . MS (multiple sclerosis)     2007  . Back pain   . Hiatal hernia   . Schatzki's ring         Outpatient Encounter Prescriptions as of 03/21/2014  Medication Sig  . atorvastatin (LIPITOR) 40 MG tablet Take 1 tablet (40 mg total) by mouth daily.  Marland Kitchen FLUoxetine (PROZAC) 40 MG capsule Take 1 capsule (40 mg total) by mouth daily.  . Multiple Vitamin (MULTIVITAMIN WITH MINERALS) TABS tablet Take 1 tablet by mouth daily.  . pantoprazole (PROTONIX) 40 MG tablet Take 1 tablet (40 mg total) by mouth daily.  Marland Kitchen tiZANidine (ZANAFLEX) 4 MG capsule Take 4 mg by mouth 2 (two) times daily as needed (back spasms).   . triamterene-hydrochlorothiazide (MAXZIDE-25) 37.5-25 MG per tablet Take 1 tablet by mouth daily.          Sexual History:   History  Sexual Activity  . Sexual Activity: Yes  . Birth Control/ Protection: Surgical    Abuse/Trauma History: The patient denies any history of abuse or trauma.  Psychiatric History:  While the patient acknowledges a history of depression to some degree she has never had this level of depression and stress that has been treated. She has been tried or started on Prozac recently and denies any side effects at this point. She has taken no psychiatric medicines prior to this.  Family Med/Psych History:  Family History  Problem Relation Age of Onset  . Hypertension Mother   . Hyperlipidemia Mother   . Hypertension Father     Risk of Suicide/Violence: low the patient denies any suicidal ideation. She also denies any homicidal ideation.  Impression/DX:  The patient has a history of stress and essentially her life were after the death of her mother when she was 53 years old of being responsible for both herself as well as others. She continues to this day. She had a daughter at a young age she then had children at a very young age as well. The patient essentially raised her to granddaughters. The patient has had a lot  of stress trying to take care of all the people in her life. She has been diagnosed with MS which also exacerbates her depression. The patient does appear to have a recurrent clinical depression.  Disposition/Plan:  We will set the patient up for individual psychotherapeutic interventions as well as coordinate care with Dr. Harrington Challenger who will be seeing the patient for psychiatric care.  Diagnosis:    Axis I:  Major depressive disorder, recurrent episode, moderate    Kumari Sculley R, PsyD 03/21/2014

## 2014-03-26 LAB — COLOGUARD

## 2014-04-04 ENCOUNTER — Telehealth: Payer: Self-pay | Admitting: *Deleted

## 2014-04-04 ENCOUNTER — Encounter: Payer: Self-pay | Admitting: Internal Medicine

## 2014-04-04 ENCOUNTER — Ambulatory Visit (AMBULATORY_SURGERY_CENTER): Payer: BC Managed Care – PPO | Admitting: Internal Medicine

## 2014-04-04 VITALS — BP 124/75 | HR 77 | Temp 99.1°F | Resp 12 | Ht 63.0 in | Wt 180.0 lb

## 2014-04-04 DIAGNOSIS — R1314 Dysphagia, pharyngoesophageal phase: Secondary | ICD-10-CM

## 2014-04-04 DIAGNOSIS — K222 Esophageal obstruction: Secondary | ICD-10-CM

## 2014-04-04 DIAGNOSIS — R1310 Dysphagia, unspecified: Secondary | ICD-10-CM

## 2014-04-04 DIAGNOSIS — Q394 Esophageal web: Secondary | ICD-10-CM

## 2014-04-04 DIAGNOSIS — R1319 Other dysphagia: Secondary | ICD-10-CM

## 2014-04-04 MED ORDER — SODIUM CHLORIDE 0.9 % IV SOLN: 500.0000 mL | INTRAVENOUS | Status: DC

## 2014-04-04 NOTE — Progress Notes (Signed)
A/ox3 pleased with MAC, report to Milea RN 

## 2014-04-04 NOTE — Op Note (Signed)
[place]  [address] [place], [address]   ENDOSCOPY PROCEDURE REPORT  PATIENT: Pamela Walters, Pamela Walters  MR#: [id number] BIRTHDATE: 1960-05-14 , 33  yrs. old GENDER: female ENDOSCOPIST: Jerene Bears, MD PROCEDURE DATE:  04/04/2014 PROCEDURE:  EGD w/ balloon dilation ASA CLASS:     Class II INDICATIONS:  persistent, intermittent dysphagia and history of esophageal food impaction, history of esophageal ring, esophageal dilation in Aug 2015. MEDICATIONS: Monitored anesthesia care and Propofol 250 mg IV TOPICAL ANESTHETIC: none  DESCRIPTION OF PROCEDURE: After the risks benefits and alternatives of the procedure were thoroughly explained, informed consent was obtained.  The LB [id number] endoscope was introduced through the mouth and advanced to the second portion of the duodenum , Without limitations.  The instrument was slowly withdrawn as the mucosa was fully examined.   ESOPHAGUS: A mild  Schatzki ring was again seen 36 cm from the incisors.  Using a TTS-balloon the stricture was dilated initially to 15 mm.  After the 15 mm dilation inspection revealed no mucosal change.  Dilation then performed to 16.5 mm.  The balloon was held inflated for 60 seconds.  Following this dilation, there was a small mucosal rent with no significant bleeding.  STOMACH: Multiple small, benign appearing sessile polyps (most likely fundic gland in nature) were found in the gastric fundus and gastric body.   A small hiatal hernia was noted.  Otherwise normal stomach.  DUODENUM: The duodenal mucosa showed no abnormalities in the bulb and 2nd part of the duodenum.  Retroflexed views revealed a small hiatal hernia.     The scope was then withdrawn from the patient and the procedure completed.  COMPLICATIONS: There were no immediate complications.  ENDOSCOPIC IMPRESSION: 1.   Schatzki ring was found 36 cm from the incisors; Using a TTS-balloon the stricture was dilated up to  16.10mm 2.   Multiple small sessile polyps were found in the gastric fundus and gastric body 3.   Small hiatal hernia 4.   The duodenal mucosa showed no abnormalities in the bulb and 2nd part of the duodenum  RECOMMENDATIONS: 1.  Continue taking your PPI (antiacid medicine) once daily.  It is best to be taken 20-30 minutes prior to breakfast meal. 2.  Office follow-up and repeat dilation as needed based on symptoms   eSigned:  Jerene Bears, MD 04/04/2014 2:38 PM CC:The Patient and Drema Pry, DO

## 2014-04-04 NOTE — Telephone Encounter (Signed)
Left message for patient to call back. Dr Hilarie Fredrickson has gotten Cologuard results and notes "negative test. Repeat in 3 years."

## 2014-04-04 NOTE — Patient Instructions (Addendum)
YOU HAD AN ENDOSCOPIC PROCEDURE TODAY AT [facility]: Refer to the procedure report that was given to you for any specific questions about what was found during the examination.  If the procedure report does not answer your questions, please call your gastroenterologist to clarify.  If you requested that your care partner not be given the details of your procedure findings, then the procedure report has been included in a sealed envelope for you to review at your convenience later.  YOU SHOULD EXPECT: Some feelings of bloating in the abdomen. Passage of more gas than usual.  Walking can help get rid of the air that was put into your GI tract during the procedure and reduce the bloating. If you had a lower endoscopy (such as a colonoscopy or flexible sigmoidoscopy) you may notice spotting of blood in your stool or on the toilet paper. If you underwent a bowel prep for your procedure, then you may not have a normal bowel movement for a few days.  DIET:   Drink plenty of fluids but you should avoid alcoholic beverages for 24 hours.  Please follow the dilatation diet the rest of the day.  ACTIVITY: Your care partner should take you home directly after the procedure.  You should plan to take it easy, moving slowly for the rest of the day.  You can resume normal activity the day after the procedure however you should NOT DRIVE or use heavy machinery for 24 hours (because of the sedation medicines used during the test).    SYMPTOMS TO REPORT IMMEDIATELY: A gastroenterologist can be reached at any hour.  During normal business hours, 8:30 AM to 5:00 PM Monday through Friday, call [phone].  After hours and on weekends, please call the GI answering service at [phone] who will take a message and have the physician on call contact you.    Following upper endoscopy (EGD)  Vomiting of blood or coffee ground material  New chest pain or pain under the shoulder blades  Painful or  persistently difficult swallowing  New shortness of breath  Fever of 100F or higher  Black, tarry-looking stools  FOLLOW UP: If any biopsies were taken you will be contacted by phone or by letter within the next 1-3 weeks.  Call your gastroenterologist if you have not heard about the biopsies in 3 weeks.  Our staff will call the home number listed on your records the next business day following your procedure to check on you and address any questions or concerns that you may have at that time regarding the information given to you following your procedure. This is a courtesy call and so if there is no answer at the home number and we have not heard from you through the emergency physician on call, we will assume that you have returned to your regular daily activities without incident.  SIGNATURES/CONFIDENTIALITY: You and/or your care partner have signed paperwork which will be entered into your electronic medical record.  These signatures attest to the fact that that the information above on your After Visit Summary has been reviewed and is understood.  Full responsibility of the confidentiality of this discharge information lies with you and/or your care-partner.    Handouts were given to your care partner on a dilatation diet, GERD and hiatal hernia. You may resume your current medications today. Please call if any questions or concerns.

## 2014-04-04 NOTE — Progress Notes (Signed)
Called to room to assist during endoscopic procedure.  Patient ID and intended procedure confirmed with present staff. Received instructions for my participation in the procedure from the performing physician.  

## 2014-04-04 NOTE — Progress Notes (Signed)
No problems noted in the recovery room. maw 

## 2014-04-05 ENCOUNTER — Ambulatory Visit (INDEPENDENT_AMBULATORY_CARE_PROVIDER_SITE_OTHER): Payer: BC Managed Care – PPO | Admitting: Psychiatry

## 2014-04-05 ENCOUNTER — Encounter (HOSPITAL_COMMUNITY): Payer: Self-pay | Admitting: Psychiatry

## 2014-04-05 ENCOUNTER — Telehealth: Payer: Self-pay | Admitting: *Deleted

## 2014-04-05 VITALS — BP 146/87 | HR 82 | Ht 63.0 in | Wt 179.4 lb

## 2014-04-05 DIAGNOSIS — F332 Major depressive disorder, recurrent severe without psychotic features: Secondary | ICD-10-CM

## 2014-04-05 DIAGNOSIS — F331 Major depressive disorder, recurrent, moderate: Secondary | ICD-10-CM

## 2014-04-05 MED ORDER — ARIPIPRAZOLE 5 MG PO TABS: 5.0000 mg | ORAL_TABLET | Freq: Every day | ORAL | Status: DC
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
## 2014-04-05 NOTE — Telephone Encounter (Signed)
  Follow up Call-  Call back number 04/04/2014 12/18/2013  Post procedure Call Back phone  # [phone]  Permission to leave phone message Yes Yes     Patient questions:  Do you have a fever, pain , or abdominal swelling? No. Pain Score  0 *  Have you tolerated food without any problems? Yes.    Have you been able to return to your normal activities? Yes.    Do you have any questions about your discharge instructions: Diet   No. Medications  No. Follow up visit  No.  Do you have questions or concerns about your Care? No.  Actions: * If pain score is 4 or above: No action needed, pain <4.

## 2014-04-06 NOTE — Telephone Encounter (Signed)
Patient advised of Dr Vena Rua recommendations and she verbalizes understanding.

## 2014-04-06 NOTE — Telephone Encounter (Signed)
Left message for patient to call back  

## 2014-04-10 DIAGNOSIS — Z0289 Encounter for other administrative examinations: Secondary | ICD-10-CM

## 2014-04-13 ENCOUNTER — Ambulatory Visit (HOSPITAL_COMMUNITY): Payer: Self-pay | Admitting: Psychology

## 2014-04-25 ENCOUNTER — Telehealth: Payer: Self-pay | Admitting: Family Medicine

## 2014-04-25 DIAGNOSIS — G35 Multiple sclerosis: Secondary | ICD-10-CM

## 2014-04-26 NOTE — Telephone Encounter (Signed)
Referral entered  

## 2014-04-30 ENCOUNTER — Encounter: Payer: Self-pay | Admitting: Internal Medicine

## 2014-05-03 ENCOUNTER — Encounter: Payer: Self-pay | Admitting: Neurology

## 2014-05-03 ENCOUNTER — Ambulatory Visit (INDEPENDENT_AMBULATORY_CARE_PROVIDER_SITE_OTHER): Payer: BLUE CROSS/BLUE SHIELD | Admitting: Neurology

## 2014-05-03 VITALS — BP 154/80 | HR 74 | Resp 14 | Ht 63.0 in | Wt 180.0 lb

## 2014-05-03 DIAGNOSIS — G4719 Other hypersomnia: Secondary | ICD-10-CM

## 2014-05-03 DIAGNOSIS — R930 Abnormal findings on diagnostic imaging of skull and head, not elsewhere classified: Secondary | ICD-10-CM

## 2014-05-03 DIAGNOSIS — R269 Unspecified abnormalities of gait and mobility: Secondary | ICD-10-CM

## 2014-05-03 DIAGNOSIS — G2581 Restless legs syndrome: Secondary | ICD-10-CM

## 2014-05-03 DIAGNOSIS — IMO0001 Reserved for inherently not codable concepts without codable children: Secondary | ICD-10-CM

## 2014-05-03 DIAGNOSIS — R5382 Chronic fatigue, unspecified: Secondary | ICD-10-CM

## 2014-05-03 DIAGNOSIS — R0683 Snoring: Secondary | ICD-10-CM

## 2014-05-03 MED ORDER — GABAPENTIN 300 MG PO CAPS: ORAL_CAPSULE | ORAL | Status: DC

## 2014-05-03 NOTE — Progress Notes (Signed)
[facility]  PATIENT: Pamela Walters DOB: [birth year]  REFERRING CLINICIAN: [name] HISTORY FROM: Patient REASON FOR VISIT: Possible MS, fatigue, myalgias   HISTORICAL  CHIEF COMPLAINT:  Chief Complaint  Patient presents with  . Multiple Sclerosis    HISTORY OF PRESENT ILLNESS:  She is a 54 year old woman who was experiencing a lot of migraine headaches in 2006 and 2007. Headaches were bilateral and were associated with visual aura. Pain was throbbing and she had associated photophobia and phonophobia. About that time, her mother was diagnosed with a cerebral aneurysm. Pamela Walters underwent an MRI of the brain and an MR angiogram. The angiogram was normal but the MRI of the brain showed multiple white matter foci in a nonspecific pattern. She reports that Dr. Doy Mince did a lumbar puncture and she was told that the results were abnormal, consistent with MS. She was placed on Betaseron but had difficulties tolerating it. She felt worse on Betaseron than off the medication and she stopped.   She has not been on any DMT for her MS over the last 4 - 5 years. We had a long discussion about her symptoms at that time and over the next few years. The diagnosis of MS was made based on the MRI and abnormal CSF. She did not have any definite exacerbations.  Specifically, there were no episodes of numbness, weakness, clumsiness or visual change that came on over a short period of time.   The diagnosis was questioned by another neurologist she saw about a year ago in [place]. A repeat MRI of the brain showed that the might of been some progression of the MRI changes. An MRI of the thoracic spine did not show any spinal lesions. I am asked to consult about the possibility of multiple sclerosis as well as to address her current symptoms.  Currently, her main symptoms are fatigue and myalgias.  These issue have worsened over the past 6 months   She also notes her vision is  blurry at times, left eye worse than right.     Fatigue has been a problem for the past year, but much worse over the past 6 months.  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    She snores at night but her granddaughter has only noted rare apneic episodes.    She has nocturia x 3.   She wakes up with a dry throat but not a headache.   She also notes that she gets cramps in her legs which have also worsened over the past year.   She moves her legs a lot at night and has difficulty getting comfortable.    If she sits a long time she also needs to move them and she needs to get out of her car to walk around if she travels far.    She takes tizanidine with some benefit at night.     She has myalgias in her legs more than elsewhere but sometimes the left arm has muscle ache as well.    Of note, she did start Lipitor around 6 months ago and is on 40 mg daily.    She also has LBP and MRI  Shows facet hypertrophy and spondylolisthesis at L4L5.     ESI injections have helped her pain a couple months at  Time.  She has had mood  swings and some irritability but no crying spells.    Symptoms improved on Prozac and Abilify.      She has had bladder urgency and rare incontinence (she wears pad because of risk of this).     She has mild constipation.  She has noted some blurry vision over the past year.   She notes the left eye visual acuity seems worse if she is tired and sometimes she has pain behind the left eye.      Gait is usually fine and can tandem walk.     DATA Review:   I personally reviewed her MRI images from 02/09/2013.  She has multiple white matter lesions and the majority are round and either subcortical or in the deep white matter.  A couple smaller ones are in the periventricular white matter.   There is symmetric hyperintensity in the mid midbrain.   I reviewed labwork from 2014 and  2015.   TSH and Vit D are normal.   MRI of the thoracic spine at that time showed no spinal cord lesions. MRI of the lumbar spine shows degenerative changes at a worse at L4-L5. She has 4 mm of anterolisthesis at that level   EPWORTH SLEEPINESS SCALE  On a scale of 0 - 3 what is the chance of dozing:  Sitting and Reading:   3 Watching TV:    3 Sitting inactive in a public place: 3 Passenger in car for one hour: 3 Lying down to rest in the afternoon: 3 Sitting and talking to someone: 0 Sitting quietly after lunch:  3 In a car, stopped in traffic:  0  Total (out of 24):   18   REVIEW OF SYSTEMS:  Constitutional: No fevers, chills, sweats, or change in appetite Eyes: No double vision,.   She notes eye pain Ear, nose and throat: No hearing loss, ear pain, nasal congestion, sore throat Cardiovascular: No chest pain, palpitations Respiratory:  No shortness of breath at rest or with exertion.   No wheezes GastrointestinaI: No nausea, vomiting, diarrhea, abdominal pain, fecal incontinence.   She has a hiatal hernia.  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   Genitourinary:  reports frequency and has 2 x nocturia.  No nocturia. Musculoskeletal:  No upper chest or  neck pain.  She reports lower back pain and has L4L5 DJD Integumentary: No rash, pruritus, skin lesions Neurological: as above Psychiatric: No depression at this time.  No anxiety Endocrine: Her weight is stable.   She has had hot flashes recently Hematologic/Lymphatic:  No anemia, purpura, petechiae. Allergic/Immunologic: No itchy/runny eyes, nasal congestion, recent allergic reactions, rashes  ALLERGIES: No Known Allergies  HOME MEDICATIONS: Outpatient Prescriptions Prior to Visit  Medication Sig Dispense Refill  . ARIPiprazole (ABILIFY) 5 MG tablet Take 1 tablet (5 mg total) by mouth daily. 30 tablet 2  . atorvastatin (LIPITOR) 40 MG tablet Take 1 tablet (40 mg total) by mouth daily. 90 tablet 1  .  FLUoxetine (PROZAC) 40 MG capsule Take 1 capsule (40 mg total) by mouth daily. 90 capsule 1  . Multiple Vitamin (MULTIVITAMIN WITH MINERALS) TABS tablet Take 1 tablet by mouth daily.    . pantoprazole (PROTONIX) 40 MG tablet Take 1 tablet (40 mg total) by mouth daily. 90 tablet 3  . tiZANidine (ZANAFLEX) 4 MG capsule Take 4 mg by mouth 2 (two) times daily as needed (back spasms).     . triamterene-hydrochlorothiazide (MAXZIDE-25) 37.5-25 MG per tablet Take 1 tablet  by mouth daily. 90 tablet 1   No facility-administered medications prior to visit.    PAST MEDICAL HISTORY: Past Medical History  Diagnosis Date  . Hypertension   . Hyperlipidemia   . MS (multiple sclerosis)     2007  . Back pain   . Hiatal hernia   . Schatzki's ring     PAST SURGICAL HISTORY: Past Surgical History  Procedure Laterality Date  . Abdominal hysterectomy  2005    fibroids  . Esophagogastroduodenoscopy N/A 09/18/2013    Procedure: ESOPHAGOGASTRODUODENOSCOPY (EGD);  Surgeon: Jerene Bears, MD;  Location: [facility];  Service: Gastroenterology;  Laterality: N/A;    FAMILY HISTORY: Family History  Problem Relation Age of Onset  . Hypertension Mother   . Hyperlipidemia Mother   . Hypertension Father   . Colon cancer Neg Hx   . Esophageal cancer Neg Hx   . Rectal cancer Neg Hx   . Stomach cancer Neg Hx     SOCIAL HISTORY:  History   Social History  . Marital Status: Married    Spouse Name: N/A    Number of Children: N/A  . Years of Education: N/A   Occupational History  . Not on file.   Social History Main Topics  . Smoking status: Never Smoker   . Smokeless tobacco: Never Used  . Alcohol Use: No  . Drug Use: No  . Sexual Activity: Yes    Birth Control/ Protection: Surgical   Other Topics Concern  . Not on file   Social History Narrative     PHYSICAL EXAM  Filed Vitals:   05/03/14 1556  BP: 154/80  Pulse: 74  Resp: 14  Height: 5\' 3"  (1.6 m)  Weight: 180 lb (81.647 kg)     Body mass index is 31.89 kg/(m^2).   General: The patient is well-developed and well-nourished and in no acute distress.  Affect is normal.  Eyes:  Funduscopic exam shows normal optic discs and retinal vessels. Sclerae are anicteric.  Neck: The neck is supple, no carotid bruits are noted.  The neck is nontender. Throat is nontender.  Respiratory: The respiratory examination is clear.  Cardiovascular: The cardiovascular examination reveals a regular rate and rhythm, no murmurs, gallops or rubs are noted.  Skin: Extremities are without significant edema. No sclerodactyly is noted.  Musculoskeletal:   She has mild tenderness in the lower lumbar spine there is no tenderness in the muscles of the upper back or chest. Range of motion appeared to be normal in the neck shoulders and hips.  Neurologic Exam  Mental status: The patient is alert and oriented x 3 at the time of the examination. The patient has apparent normal recent and remote memory, with an apparently normal attention span and concentration ability.   Speech is normal.  Cranial nerves: Extraocular movements are full. Pupils are equal, round, and reactive to light and accomodation.  Visual fields are full.  Facial symmetry is present. There is good facial sensation to soft touch bilaterally.Facial strength is normal.  Trapezius and sternocleidomastoid strength is normal. No dysarthria is noted.  The tongue is midline, and the patient has symmetric elevation of the soft palate. No obvious hearing deficits are noted.  Motor:  Muscle bulk is normal. Tone was normal in the arms but slightly increased in the legs, a little more on the right. Strength is  5 / 5 in all 4 extremities.   Sensory: Sensory testing is intact to pinprick, soft touch, vibration sensation, and position  sense on all 4 extremities.  Coordination: Cerebellar testing reveals good finger-nose-finger and heel-to-shin bilaterally.  Gait and station: The station are  stable with the eyes open but she was less stable with the eyes closed. Gait was normal. Tandem walk was minimally wide.   Reflexes: Deep tendon reflexes are very brisk in the legs and arms bilaterally. Plantar responses are normal.    DIAGNOSTIC DATA (LABS, IMAGING, TESTING) - I reviewed patient records, labs, notes, testing and imaging myself where available.  Lab Results  Component Value Date   WBC 8.9 09/25/2013   HGB 11.2* 09/25/2013   HCT 34.6* 09/25/2013   MCV 81.6 09/25/2013   PLT 287 09/25/2013      Component Value Date/Time   NA 141 02/06/2014 1658   K 3.6 02/06/2014 1658   CL 101 02/06/2014 1658   CO2 32 02/06/2014 1658   GLUCOSE 64* 02/06/2014 1658   BUN 13 02/06/2014 1658   CREATININE 0.98 02/06/2014 1658   CREATININE 0.83 09/25/2013 0430   CALCIUM 9.7 02/06/2014 1658   PROT 7.1 02/06/2014 1658   ALBUMIN 4.2 02/06/2014 1658   AST 17 02/06/2014 1658   ALT 15 02/06/2014 1658   ALKPHOS 73 02/06/2014 1658   BILITOT 0.6 02/06/2014 1658   GFRNONAA 65 02/06/2014 1658   GFRNONAA 78* 09/25/2013 0430   GFRAA 75 02/06/2014 1658   GFRAA >90 09/25/2013 0430   Lab Results  Component Value Date   CHOL 146 02/06/2014   HDL 39* 02/06/2014   LDLCALC 65 02/06/2014   TRIG 209* 02/06/2014   CHOLHDL 3.7 02/06/2014   No results found for: HGBA1C No results found for: VITAMINB12 Lab Results  Component Value Date   TSH 2.078 01/30/2013       ASSESSMENT AND PLAN  54 y.o. year old female here with an abnormal MRI of the brain who was diagnosed with multiple sclerosis based on these changes and an abnormal CSF. We had a long discussion about her possibility of MS. Because of spinal fluid was also abnormal, despite the MRI be nonspecific,  MS is the most likely diagnosis. However, the foci were nonspecific and she has no atrophy an alternative diagnosis is certainly possible. If she has MS, she might have a more progressive course rather than a relapsing course as she has  had no definite exacerbation over the past 7 years. We discussed that there are currently no medications are FDA approved for primary progressive MS. However, one medication (ocrelizumab) showed some efficacy for PPMS and could be available next year. In the interim, I would like to rule out other diseases that could lead to changes on MRI. I will check blood work for vasculitis and elevated homocysteine. These changes could also be metabolic to a genetic disorder.  Many of the foci are subcortical and did be related to ischemic events. We will consider getting a bubble contrasted echocardiogram to rule out an intracardiac shunt such as a PFO or other ASD that could be a source of emboli.  We also discussed that despite her underlying pathology, we could try to improve some of her symptoms. She has snoring, excessive daytime sleepiness, nonrestorative sleep and probable restless leg syndrome and possible periodic limb movements of sleep. I will obtain a split-night sleep study to determine if she has sleep apnea and PLMS. If she has sleep apnea, CPAP or BiPAP could be considered. Poor sleep could also lead to myalgias and her fatigue.  I will have her try gabapentin to see  if her nighttime pain improves.  She will return to see me in about 6 weeks. In the interim, we will hopefully get the results of the blood work and the sleep study.  Thank you very much for asking me to see Mrs. Njie for neurologic consultation. Please doesn't know if I can be of further assistance in the future.   Richard A. Felecia Shelling, MD, PhD 08/03/2008, 0:71 PM Certified in Neurology, Clinical Neurophysiology, Sleep Medicine, Pain Medicine and Neuroimaging  [facility] [address] [place], [place] [address] [phone]

## 2014-05-03 NOTE — Patient Instructions (Signed)
I advised her to try to go to sleep at the same time every night.

## 2014-05-07 ENCOUNTER — Other Ambulatory Visit (INDEPENDENT_AMBULATORY_CARE_PROVIDER_SITE_OTHER): Payer: Self-pay

## 2014-05-07 DIAGNOSIS — Z0289 Encounter for other administrative examinations: Secondary | ICD-10-CM

## 2014-05-09 LAB — PAN-ANCA
ANCA Proteinase 3: <3.5 U/mL (ref 0.0–3.5)
Atypical pANCA: <1:20 {titer}
C-ANCA: <1:20 {titer}
Myeloperoxidase Ab: <9 U/mL (ref 0.0–9.0)
P-ANCA: <1:20 {titer}

## 2014-05-09 LAB — SPECIMEN STATUS REPORT

## 2014-05-10 ENCOUNTER — Ambulatory Visit (HOSPITAL_COMMUNITY): Payer: Self-pay | Admitting: Psychiatry

## 2014-05-10 LAB — ANA: Anti Nuclear Antibody(ANA): NEGATIVE

## 2014-05-10 LAB — SEDIMENTATION RATE: SED RATE: 9 mm/h (ref 0–40)

## 2014-05-10 LAB — CK: Total CK: 102 U/L (ref 24–173)

## 2014-05-10 LAB — HOMOCYSTEINE: HOMOCYSTEINE: 17.6 umol/L — AB (ref 0.0–15.0)

## 2014-05-11 ENCOUNTER — Other Ambulatory Visit: Payer: Self-pay | Admitting: *Deleted

## 2014-05-11 DIAGNOSIS — R7983 Abnormal findings of blood amino-acid level: Secondary | ICD-10-CM

## 2014-05-11 DIAGNOSIS — E7219 Other disorders of sulfur-bearing amino-acid metabolism: Principal | ICD-10-CM

## 2014-05-11 MED ORDER — FA-PYRIDOXINE-CYANOCOBALAMIN 2.5-25-2 MG PO TABS: 1.0000 | ORAL_TABLET | Freq: Every day | ORAL | Status: DC

## 2014-05-14 ENCOUNTER — Telehealth: Payer: Self-pay | Admitting: *Deleted

## 2014-05-14 NOTE — Telephone Encounter (Signed)
LMOM on Friday 05-11-14 with lab results, advised Foltyx escribed to pharmacy; call if she has questions/concerns/fim

## 2014-05-16 ENCOUNTER — Telehealth: Payer: Self-pay | Admitting: *Deleted

## 2014-05-16 NOTE — Telephone Encounter (Signed)
LMOM to let Devanshi know that per RAS labs look ok--nothing further to be done at this time--continue meds as rx and call if she has questions/concerns/fim

## 2014-05-17 ENCOUNTER — Encounter (HOSPITAL_COMMUNITY): Payer: Self-pay | Admitting: Psychology

## 2014-05-17 ENCOUNTER — Ambulatory Visit (INDEPENDENT_AMBULATORY_CARE_PROVIDER_SITE_OTHER): Payer: BLUE CROSS/BLUE SHIELD | Admitting: Psychology

## 2014-05-17 DIAGNOSIS — F331 Major depressive disorder, recurrent, moderate: Secondary | ICD-10-CM

## 2014-05-17 LAB — LIPID PANEL
CHOLESTEROL: 159 mg/dL (ref 0–200)
HDL: 40 mg/dL (ref >39)
LDL Cholesterol: 93 mg/dL (ref 0–99)
Total CHOL/HDL Ratio: 4 Ratio
Triglycerides: 131 mg/dL (ref <150)
VLDL: 26 mg/dL (ref 0–40)

## 2014-05-17 LAB — COMPREHENSIVE METABOLIC PANEL
ALBUMIN: 4.2 g/dL (ref 3.5–5.2)
ALK PHOS: 84 U/L (ref 39–117)
ALT: 17 U/L (ref 0–35)
AST: 21 U/L (ref 0–37)
BILIRUBIN TOTAL: 0.4 mg/dL (ref 0.2–1.2)
BUN: 11 mg/dL (ref 6–23)
CHLORIDE: 101 meq/L (ref 96–112)
CO2: 32 meq/L (ref 19–32)
Calcium: 9.6 mg/dL (ref 8.4–10.5)
Creat: 0.94 mg/dL (ref 0.50–1.10)
Glucose, Bld: 91 mg/dL (ref 70–99)
Potassium: 3.8 mEq/L (ref 3.5–5.3)
Sodium: 140 mEq/L (ref 135–145)
Total Protein: 7 g/dL (ref 6.0–8.3)

## 2014-05-17 LAB — TSH: TSH: 2.732 u[IU]/mL (ref 0.350–4.500)

## 2014-05-17 NOTE — Progress Notes (Signed)
Patient:  Pamela Walters   DOB: 1961-01-17  MR Number: [id number]  Location: [facility] [place] [address] Dept: [phone]  Start: 10 AM End: 11 AM  Provider/Observer:     Edgardo Roys PSYD  Chief Complaint:      Chief Complaint  Patient presents with  . Depression    Reason For Service:    The patient is a 54 year old Caucasian female who was referred by Dr. Moshe Cipro for psychological/psychotherapeutic and psychiatric interventions. The patient has been dealing with depressive symptoms for quite some time but family stress and other issues have made it more significant recently. Dr. Moshe Cipro recently started her on Prozac but felt that psychotherapeutic interventions and further psychiatric interventions was warranted. The patient reports that her major family stressors right now have to do with her 85 year old daughter who has 4 children. The patient has raised the 2 oldest grandchildren since they were very young and the youngest of these grandchildren has had difficulties recently and been in [employer] after a suicide attempt. The patient reports that she feels like she is fighting about every day and has been doing so for years. The patient reports that she always feels like she should fix things were everyone else and does not typically take care of herself. The patient reports that she feels like she has no energy and just can't do anything for herself.  Complicating the patient's symptoms is a diagnosis 7 years ago of multiple sclerosis. She reports that a recent study suggested that it was slowly progressing.  Interventions Strategy:  Cognitive/behavioral psychotherapeutic interventions  Participation Level:   Active  Participation Quality:  Appropriate      Behavioral Observation:  Well Groomed, Alert, and Depressed.   Current Psychosocial Factors: The  patient reports that she has continued to have symptoms consistent with what has been going on. She reports that she has been starting to work on some of the therapeutic interventions and feels like overall she is doing better but continues to have significant depression spends everything finding issues related to stress within the family as well as her ongoing difficulties.  Content of Session:   Reviewed current symptoms and continued work on therapeutic interventions  Current Status:   The patient reports continued symptoms of depression and a lot of stress around psychosocial issues.  Patient Progress:   Stable  Target Goals:   Target goals include reducing the intensity, severity, and duration of depression and anxiety. Increasing social activity and increasing coping skills around his underlying psychological/psychiatric issues remain paramount.  Last Reviewed:   05/17/2014  Goals Addressed Today:    Today we worked on Therapist, occupational and strategies around issues of recurrent depression and anxiety.  Impression/Diagnosis:   The patient has a history of stress and essentially her life were after the death of her mother when she was 56 years old of being responsible for both herself as well as others. She continues to this day. She had a daughter at a young age she then had children at a very young age as well. The patient essentially raised her to granddaughters. The patient has had a lot of stress trying to take care of all the people in her life. She has been diagnosed with MS which also exacerbates her depression. The patient does appear to have a recurrent clinical depression.  Diagnosis:    Axis I: Major depressive disorder, recurrent episode, moderate

## 2014-05-18 ENCOUNTER — Ambulatory Visit: Payer: BLUE CROSS/BLUE SHIELD | Admitting: Family Medicine

## 2014-05-22 ENCOUNTER — Other Ambulatory Visit: Payer: Self-pay

## 2014-05-22 DIAGNOSIS — F329 Major depressive disorder, single episode, unspecified: Secondary | ICD-10-CM

## 2014-05-22 DIAGNOSIS — F32A Depression, unspecified: Secondary | ICD-10-CM

## 2014-05-22 DIAGNOSIS — I10 Essential (primary) hypertension: Secondary | ICD-10-CM

## 2014-05-22 MED ORDER — TRIAMTERENE-HCTZ 37.5-25 MG PO TABS: 1.0000 | ORAL_TABLET | Freq: Every day | ORAL | Status: DC

## 2014-05-22 MED ORDER — FLUOXETINE HCL 40 MG PO CAPS: 40.0000 mg | ORAL_CAPSULE | Freq: Every day | ORAL | Status: DC

## 2014-05-25 ENCOUNTER — Ambulatory Visit (HOSPITAL_COMMUNITY): Payer: Self-pay | Admitting: Psychiatry

## 2014-05-25 ENCOUNTER — Telehealth (HOSPITAL_COMMUNITY): Payer: Self-pay | Admitting: *Deleted

## 2014-05-28 ENCOUNTER — Ambulatory Visit (INDEPENDENT_AMBULATORY_CARE_PROVIDER_SITE_OTHER): Payer: BLUE CROSS/BLUE SHIELD | Admitting: Psychiatry

## 2014-05-28 ENCOUNTER — Encounter (HOSPITAL_COMMUNITY): Payer: Self-pay | Admitting: Psychiatry

## 2014-05-28 VITALS — BP 138/71 | HR 75 | Ht 63.0 in | Wt 184.0 lb

## 2014-05-28 DIAGNOSIS — F32A Depression, unspecified: Secondary | ICD-10-CM

## 2014-05-28 DIAGNOSIS — F329 Major depressive disorder, single episode, unspecified: Secondary | ICD-10-CM

## 2014-05-28 DIAGNOSIS — F331 Major depressive disorder, recurrent, moderate: Secondary | ICD-10-CM

## 2014-05-28 DIAGNOSIS — F332 Major depressive disorder, recurrent severe without psychotic features: Secondary | ICD-10-CM

## 2014-05-28 MED ORDER — ARIPIPRAZOLE 5 MG PO TABS: 5.0000 mg | ORAL_TABLET | Freq: Every day | ORAL | Status: DC
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
## 2014-05-30 ENCOUNTER — Ambulatory Visit (INDEPENDENT_AMBULATORY_CARE_PROVIDER_SITE_OTHER): Payer: BLUE CROSS/BLUE SHIELD | Admitting: Family Medicine

## 2014-05-30 ENCOUNTER — Encounter: Payer: Self-pay | Admitting: Family Medicine

## 2014-05-30 VITALS — BP 118/80 | HR 73 | Resp 16 | Ht 63.0 in | Wt 182.8 lb

## 2014-05-30 DIAGNOSIS — I10 Essential (primary) hypertension: Secondary | ICD-10-CM

## 2014-05-30 DIAGNOSIS — E785 Hyperlipidemia, unspecified: Secondary | ICD-10-CM

## 2014-05-30 DIAGNOSIS — M549 Dorsalgia, unspecified: Secondary | ICD-10-CM

## 2014-05-30 DIAGNOSIS — R32 Unspecified urinary incontinence: Secondary | ICD-10-CM | POA: Insufficient documentation

## 2014-05-30 DIAGNOSIS — G35 Multiple sclerosis: Secondary | ICD-10-CM

## 2014-05-30 DIAGNOSIS — F418 Other specified anxiety disorders: Secondary | ICD-10-CM

## 2014-05-30 DIAGNOSIS — N3946 Mixed incontinence: Secondary | ICD-10-CM

## 2014-05-30 MED ORDER — SOLIFENACIN SUCCINATE 5 MG PO TABS: 5.0000 mg | ORAL_TABLET | Freq: Every day | ORAL | Status: DC

## 2014-05-30 NOTE — Progress Notes (Signed)
   Subjective:    Patient ID: Pamela Walters, female    DOB: January 18, 1961, 54 y.o.   MRN: [id number]  HPI The PT is here for follow up and re-evaluation of chronic medical conditions, medication management and review of any available recent lab and radiology data.  Preventive health is updated, specifically  Cancer screening and Immunization.   Questions or concerns regarding consultations or procedures which the PT has had in the interim are  Addressed.Is being followed by psychiatry and psychology, also, GI, neurology and [pain management, overwhelmed by appts, but satisfied as she feels better The PT denies any adverse reactions to current medications since the last visit.  C/o markedly disabling urinary incontinence, needs help  Depression and anxiety have improved but are still significant as ongoing issues with her adult daughter's home situation     Review of Systems See HPI Denies recent fever or chills. Denies sinus pressure, nasal congestion, ear pain or sore throat. Denies chest congestion, productive cough or wheezing. Denies chest pains, palpitations and leg swelling Denies abdominal pain, nausea, vomiting,diarrhea or constipation.   Denies dysuria, frequency, hesitancy. C/o chronic back pain,  and limitation in mobility. Denies headaches, seizures, numbness, or tingling. Denies uncontrolled  depression, anxiety or insomnia. Denies skin break down or rash.        Objective:   Physical Exam BP 118/80 mmHg  Pulse 73  Resp 16  Ht 5\' 3"  (1.6 m)  Wt 182 lb 12.8 oz (82.918 kg)  BMI 32.39 kg/m2  SpO2 97%  Patient alert and oriented and in no cardiopulmonary distress.  HEENT: No facial asymmetry, EOMI,   oropharynx pink and moist.  Neck supple no JVD, no mass.  Chest: Clear to auscultation bilaterally.  CVS: S1, S2 no murmurs, no S3.Regular rate.  ABD: Soft non tender.   Ext: No edema  MS: Adequate though reduced  ROM lumbar spine, adequate in  shoulders,  hips and knees.  Skin: Intact, no ulcerations or rash noted.  Psych: Good eye contact, normal affect. Memory intact miuch less  anxious or depressed appearing.  CNS: CN 2-12 intact, power,  normal throughout.no focal deficits noted.        Assessment & Plan:  Essential hypertension Controlled, no change in medication DASH diet and commitment to daily physical activity for a minimum of 30 minutes discussed and encouraged, as a part of hypertension management. The importance of attaining a healthy weight is also discussed.    Depression with anxiety Marked improvement Being treated by psychiatry   MULTIPLE SCLEROSIS, PROGRESSIVE/RELAPSING Has estab;lished wit  Neurologist, has had tests which will be reviewed at next visit prior to starting any specific medication   Back pain with radiation Onngoing, as upcoming epidural ij the near future, did well with prior injection, though does not want to rept these too often   Urinary incontinence Increasingly disabling, trial of vesicare   Hyperlipidemia LDL goal <100 Controlled, no change in medication Hyperlipidemia:Low fat diet discussed and encouraged.

## 2014-05-30 NOTE — Patient Instructions (Addendum)
F/u in 6 month, call if you need me before  New for incontinence is  vesicare 5mg  one daily, if this is good, then stay on this dose. If still not where you need  Then after 14 days, increase to TWO 5 mg daily, and let me know   Thankful that you are so much better, continue to work on your health

## 2014-06-03 NOTE — Assessment & Plan Note (Signed)
Has estab;lished wit  Neurologist, has had tests which will be reviewed at next visit prior to starting any specific medication

## 2014-06-03 NOTE — Assessment & Plan Note (Signed)
Marked improvement Being treated by psychiatry

## 2014-06-03 NOTE — Assessment & Plan Note (Signed)
Increasingly disabling, trial of vesicare

## 2014-06-03 NOTE — Assessment & Plan Note (Signed)
Onngoing, as upcoming epidural ij the near future, did well with prior injection, though does not want to rept these too often

## 2014-06-03 NOTE — Assessment & Plan Note (Signed)
Controlled, no change in medication Hyperlipidemia:Low fat diet discussed and encouraged.  \ 

## 2014-06-03 NOTE — Assessment & Plan Note (Signed)
Controlled, no change in medication DASH diet and commitment to daily physical activity for a minimum of 30 minutes discussed and encouraged, as a part of hypertension management. The importance of attaining a healthy weight is also discussed.  

## 2014-06-07 ENCOUNTER — Ambulatory Visit: Payer: BLUE CROSS/BLUE SHIELD | Admitting: Neurology

## 2014-06-11 ENCOUNTER — Ambulatory Visit: Payer: BLUE CROSS/BLUE SHIELD | Admitting: Neurology

## 2014-06-12 ENCOUNTER — Ambulatory Visit (HOSPITAL_COMMUNITY): Payer: Self-pay | Admitting: Psychology

## 2014-06-27 ENCOUNTER — Encounter: Payer: Self-pay | Admitting: Internal Medicine

## 2014-07-10 ENCOUNTER — Telehealth: Payer: Self-pay | Admitting: *Deleted

## 2014-07-10 NOTE — Telephone Encounter (Signed)
Spoke with Fionnuala--appt. moved to [place] on 07-13-14.  She is agreeable with this/fim

## 2014-07-10 NOTE — Telephone Encounter (Signed)
LMOM for Pamela Walters to call.  She has an appt. with RAS on 07-13-14 at 1125--he has a lunch meeting and will not be here.  He can see her at 0840--I have already reserved this time for her until I hear back from her.  If she is not able to come in at [place], I will offer another day/time/fim

## 2014-07-13 ENCOUNTER — Ambulatory Visit (INDEPENDENT_AMBULATORY_CARE_PROVIDER_SITE_OTHER): Payer: BLUE CROSS/BLUE SHIELD | Admitting: Neurology

## 2014-07-13 ENCOUNTER — Encounter: Payer: Self-pay | Admitting: Neurology

## 2014-07-13 VITALS — BP 124/68 | HR 74 | Resp 14 | Ht 63.0 in | Wt 191.4 lb

## 2014-07-13 DIAGNOSIS — M609 Myositis, unspecified: Secondary | ICD-10-CM

## 2014-07-13 DIAGNOSIS — G4719 Other hypersomnia: Secondary | ICD-10-CM | POA: Diagnosis not present

## 2014-07-13 DIAGNOSIS — R0683 Snoring: Secondary | ICD-10-CM | POA: Diagnosis not present

## 2014-07-13 DIAGNOSIS — M791 Myalgia, unspecified site: Secondary | ICD-10-CM | POA: Insufficient documentation

## 2014-07-13 DIAGNOSIS — Z0289 Encounter for other administrative examinations: Secondary | ICD-10-CM

## 2014-07-13 NOTE — Progress Notes (Signed)
[facility]  PATIENT: Pamela Walters DOB: 1960-05-16     HISTORICAL  CHIEF COMPLAINT:  Chief Complaint  Patient presents with  . Abnormal mri    She is not currently on any MS therapy.    . Snoring    Sts. she was never contacted to schedule sleep study/fim  . Fatigue    HISTORY OF PRESENT ILLNESS:  Pamela Walters is a 54 year old woman who was diagnosed with multiple sclerosis in 2007 based on an abnormal MRI and spinal fluid. She was started on Betaseron but stopped due to side effects. She has not had definite clinical exacerbations.   MRI of the brain shows many white matter foci, but in a more nonspecific pattern. There has been mild progression between 2007 2014. She does not have any numbness, weakness or gait issues. She does have some bladder urgency and frequency with nocturia and rare incontinence. When she is tired her left eye seems to have reduced visual acuity.   Her main symptoms are fatigue and myalgia:  Fatigue/sleepiness:   She reports fatigue on a daily basis. Additionally she has excessive daytime sleepiness and her Epworth sleepiness scale score was 18/24 (moderate sleepiness) he also snores. A split-night PSG was ordered but she never had it done. Sleep quality was poor and she wakes up frequently. She notes nocturia. She often has to move her legs a lot to get comfortable.  Gabapentin was prescribed at her last visit.   On it, she sleeps better with fewer awakenings.    EPWORTH SLEEPINESS SCALE  On a scale of 0 - 3 what is the chance of dozing:  Sitting and Reading:   3 Watching TV:    3 Sitting inactive in a public place: 2 Passenger in car for one hour: 3 Lying down to rest in the afternoon: 3 Sitting and talking to someone: 0 Sitting quietly after lunch:  3 In a car, stopped in traffic:  0  Total (out of 24):    17/24     (was 18)    Myalgias:  She reports cramp-like pain in her legs and sometimes in the left arm.   She is  on Lipitor. She does have degenerative ages in the lower lumbar spine with anterolisthesis at L4-L5 and L4-L5 and L5-S1 facet hypertrophy. There is no nerve root impingement.   Myalgias are also better on  The gabapentin  MS?:   MRIs of the brain from 02/09/2013 and 07/14/2005 show multiple T2 hyperintense foci in the white matter of both hemispheres foci were predominantly in the deep and subcortical white matter. Very few were in the periventricular white matter. Subcortical foci were round. There were no acute enhancing lesions. When compared to the MRI from 07/14/2005, there has been slight progression, but most of the foci present in 2014 were also present in 2007. MRI of the lumbar spine dated 02/13/2013. It shows 4 mm of anterolisthesis of L4-L5 associated with moderate facet hypertrophy and some bulging of the uncovered disc. There is only mild foraminal narrowing and there does not appear to be any nerve root impingement. At L5-S1 there is minimal central disc bulging and facet hypertrophy. There is no nerve root impingement. MRI of the thoracic spine showed minimal to gentle changes in the lower thoracic spine but no nerve root impingement. The spinal cord was normal.    ROS:  Out of a complete 14 system review of symptoms, the patient complains only of the following symptoms, and all  other reviewed systems are negative.  Fatigue, myalgias   ALLERGIES: No Known Allergies  HOME MEDICATIONS:  Current outpatient prescriptions:  .  ARIPiprazole (ABILIFY) 5 MG tablet, Take 1 tablet (5 mg total) by mouth daily., Disp: 90 tablet, Rfl: 2 .  atorvastatin (LIPITOR) 40 MG tablet, Take 1 tablet (40 mg total) by mouth daily., Disp: 90 tablet, Rfl: 1 .  FLUoxetine (PROZAC) 40 MG capsule, Take 1 capsule (40 mg total) by mouth daily., Disp: 90 capsule, Rfl: 1 .  folic acid-pyridoxine-cyancobalamin (FOLTX) 2.5-25-2 MG TABS, Take 1 tablet by mouth daily., Disp: 30 each, Rfl: 12 .  gabapentin  (NEURONTIN) 300 MG capsule, One pill in am, one pill in evening and two at bedtime, Disp: 120 capsule, Rfl: 11 .  Multiple Vitamin (MULTIVITAMIN WITH MINERALS) TABS tablet, Take 1 tablet by mouth daily., Disp: , Rfl:  .  pantoprazole (PROTONIX) 40 MG tablet, Take 1 tablet (40 mg total) by mouth daily., Disp: 90 tablet, Rfl: 3 .  solifenacin (VESICARE) 5 MG tablet, Take 1 tablet (5 mg total) by mouth daily., Disp: 30 tablet, Rfl: 5 .  tiZANidine (ZANAFLEX) 4 MG capsule, Take 4 mg by mouth 2 (two) times daily as needed (back spasms). , Disp: , Rfl:  .  triamterene-hydrochlorothiazide (MAXZIDE-25) 37.5-25 MG per tablet, Take 1 tablet by mouth daily., Disp: 90 tablet, Rfl: 1 .  HYDROcodone-acetaminophen (NORCO/VICODIN) 5-325 MG per tablet, , Disp: , Rfl:  .  metaxalone (SKELAXIN) 800 MG tablet, , Disp: , Rfl:   PAST MEDICAL HISTORY: Past Medical History  Diagnosis Date  . Hypertension   . Hyperlipidemia   . MS (multiple sclerosis)     2007  . Back pain   . Hiatal hernia   . Schatzki's ring     PAST SURGICAL HISTORY: Past Surgical History  Procedure Laterality Date  . Abdominal hysterectomy  2005    fibroids  . Esophagogastroduodenoscopy N/A 09/18/2013    Procedure: ESOPHAGOGASTRODUODENOSCOPY (EGD);  Surgeon: Jerene Bears, MD;  Location: [facility];  Service: Gastroenterology;  Laterality: N/A;    FAMILY HISTORY: Family History  Problem Relation Age of Onset  . Hypertension Mother   . Hyperlipidemia Mother   . Hypertension Father   . Colon cancer Neg Hx   . Esophageal cancer Neg Hx   . Rectal cancer Neg Hx   . Stomach cancer Neg Hx     SOCIAL HISTORY:  History   Social History  . Marital Status: Married    Spouse Name: N/A  . Number of Children: N/A  . Years of Education: N/A   Occupational History  . Not on file.   Social History Main Topics  . Smoking status: Never Smoker   . Smokeless tobacco: Never Used  . Alcohol Use: No  . Drug Use: No  . Sexual Activity:  Yes    Birth Control/ Protection: Surgical   Other Topics Concern  . Not on file   Social History Narrative     PHYSICAL EXAM  Filed Vitals:   07/13/14 0857  BP: 124/68  Pulse: 74  Resp: 14  Height: 5\' 3"  (1.6 m)  Weight: 191 lb 6.4 oz (86.818 kg)    Body mass index is 33.91 kg/(m^2).   General: The patient is well-developed and well-nourished and in no acute distress  Neck: The neck is supple..  The neck is nontender.  Neurologic Exam  Mental status: The patient is alert and oriented x 3 at the time of the examination.  Speech is normal.  Cranial nerves: Extraocular movements are full.    Facial symmetry is present.    No dysarthria is noted.   No obvious hearing deficits are noted.  Motor:  Muscle bulk is normal.   Tone is normal. Strength is  5 / 5 in all 4 extremities.   Gait and station: Station is normal.   Gait is normal. Tandem gait is normal. Romberg is negative.       DIAGNOSTIC DATA (LABS, IMAGING, TESTING) - I reviewed patient records, labs, notes, testing and imaging myself where available.  Lab Results  Component Value Date   WBC 8.9 09/25/2013   HGB 11.2* 09/25/2013   HCT 34.6* 09/25/2013   MCV 81.6 09/25/2013   PLT 287 09/25/2013      Component Value Date/Time   NA 140 05/17/2014 0933   K 3.8 05/17/2014 0933   CL 101 05/17/2014 0933   CO2 32 05/17/2014 0933   GLUCOSE 91 05/17/2014 0933   BUN 11 05/17/2014 0933   CREATININE 0.94 05/17/2014 0933   CREATININE 0.83 09/25/2013 0430   CALCIUM 9.6 05/17/2014 0933   PROT 7.0 05/17/2014 0933   ALBUMIN 4.2 05/17/2014 0933   AST 21 05/17/2014 0933   ALT 17 05/17/2014 0933   ALKPHOS 84 05/17/2014 0933   BILITOT 0.4 05/17/2014 0933   GFRNONAA 65 02/06/2014 1658   GFRNONAA 78* 09/25/2013 0430   GFRAA 75 02/06/2014 1658   GFRAA >90 09/25/2013 0430   Lab Results  Component Value Date   CHOL 159 05/17/2014   HDL 40 05/17/2014   LDLCALC 93 05/17/2014   TRIG 131 05/17/2014   CHOLHDL 4.0  05/17/2014   No results found for: HGBA1C No results found for: [id number] Lab Results  Component Value Date   TSH 2.732 05/17/2014       ASSESSMENT AND PLAN Snoring  Excessive daytime sleepiness  Myalgia and myositis   1.    Order split night PSG and order CPAP if indicated.    If no evidence of OSA, consider MSLT for hypersomnia and consider stimulants 2.   Continue gabapentin rtc 3 months, sooner if any new or worsening symptoms.   Richard A. Felecia Shelling, MD, PhD 0/98/1191, 4:78 AM Certified in Neurology, Clinical Neurophysiology, Sleep Medicine, Pain Medicine and Neuroimaging  [facility] [address], [place], [place] [address] [phone]

## 2014-07-23 ENCOUNTER — Encounter: Payer: Self-pay | Admitting: Family Medicine

## 2014-07-23 ENCOUNTER — Telehealth: Payer: Self-pay | Admitting: Family Medicine

## 2014-07-23 NOTE — Telephone Encounter (Signed)
Pls call pt rebladder incontinence med I advised her to send msg re preferred drug, but call and offer oxybutynin/ ditropan  which I have entered, pls fax if she agrees ,I only wrote for 30 day supply so she could cgheck on cost, I think this should work I will ask her to call you in am

## 2014-07-24 ENCOUNTER — Other Ambulatory Visit: Payer: Self-pay

## 2014-07-24 MED ORDER — OXYBUTYNIN CHLORIDE ER 5 MG PO TB24: 5.0000 mg | ORAL_TABLET | Freq: Every day | ORAL | Status: DC

## 2014-07-24 NOTE — Telephone Encounter (Signed)
Pt aware.

## 2014-07-27 ENCOUNTER — Encounter (HOSPITAL_COMMUNITY): Payer: Self-pay | Admitting: Psychiatry

## 2014-07-27 ENCOUNTER — Ambulatory Visit (INDEPENDENT_AMBULATORY_CARE_PROVIDER_SITE_OTHER): Payer: BLUE CROSS/BLUE SHIELD | Admitting: Psychiatry

## 2014-07-27 VITALS — BP 141/81 | HR 78 | Ht 63.0 in | Wt 191.0 lb

## 2014-07-27 DIAGNOSIS — F32A Depression, unspecified: Secondary | ICD-10-CM

## 2014-07-27 DIAGNOSIS — F332 Major depressive disorder, recurrent severe without psychotic features: Secondary | ICD-10-CM

## 2014-07-27 DIAGNOSIS — F329 Major depressive disorder, single episode, unspecified: Secondary | ICD-10-CM

## 2014-07-27 DIAGNOSIS — F331 Major depressive disorder, recurrent, moderate: Secondary | ICD-10-CM

## 2014-07-27 MED ORDER — ARIPIPRAZOLE 5 MG PO TABS: 5.0000 mg | ORAL_TABLET | Freq: Every day | ORAL | Status: DC

## 2014-07-27 MED ORDER — FLUOXETINE HCL 40 MG PO CAPS: 40.0000 mg | ORAL_CAPSULE | Freq: Every day | ORAL | Status: DC
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
## 2014-08-16 ENCOUNTER — Ambulatory Visit (INDEPENDENT_AMBULATORY_CARE_PROVIDER_SITE_OTHER): Payer: BLUE CROSS/BLUE SHIELD | Admitting: Neurology

## 2014-08-16 DIAGNOSIS — G4733 Obstructive sleep apnea (adult) (pediatric): Secondary | ICD-10-CM

## 2014-08-16 DIAGNOSIS — G4719 Other hypersomnia: Secondary | ICD-10-CM

## 2014-08-16 DIAGNOSIS — R0683 Snoring: Secondary | ICD-10-CM

## 2014-08-16 NOTE — Sleep Study (Signed)
Please see the scanned sleep study interpretation located in the Procedure tab within the Chart Review section. 

## 2014-08-20 ENCOUNTER — Encounter: Payer: Self-pay | Admitting: Neurology

## 2014-08-21 ENCOUNTER — Other Ambulatory Visit: Payer: Self-pay | Admitting: Neurology

## 2014-08-21 ENCOUNTER — Telehealth: Payer: Self-pay | Admitting: *Deleted

## 2014-08-21 ENCOUNTER — Encounter: Payer: Self-pay | Admitting: *Deleted

## 2014-08-21 DIAGNOSIS — G4733 Obstructive sleep apnea (adult) (pediatric): Secondary | ICD-10-CM

## 2014-08-21 NOTE — Telephone Encounter (Signed)
Patient was contacted in order to process her CPAP referral.  Patient was referred to [facility] for CPAP set up.  The patient gave verbal permission to mail a copy of her test results.   Patient instructed to contact our office 6-8 weeks post set up to schedule a follow up appointment.

## 2014-08-21 NOTE — Telephone Encounter (Signed)
I spoke with Pamela Walters and per RAS, advised that sleep study did show severe osa, but that once cpap therapy was started, she did well.  She reports feeling much better after wearing cpap, and sts. she would like to proceed with setting up cpap.  She is aware to expect a call from the sleep lab/fim

## 2014-08-21 NOTE — Telephone Encounter (Signed)
Spoke with Pamela Walters and per RAS, advised sleep study did show severe osa, that responded well to cpap pressure of 9.  She verbalized understanding of same, sts. she would like to proceed with cpap therapy./fim

## 2014-08-21 NOTE — Telephone Encounter (Signed)
-----   Message from Britt Bottom, MD sent at 08/20/2014  6:34 PM EDT ----- Please let her know the split night study showed severe OSA and she did well at CPAP +9 --- if she wishes to go ahead with CPAP, please send DME to [facility]

## 2014-08-22 ENCOUNTER — Encounter: Payer: Self-pay | Admitting: Neurology

## 2014-10-01 ENCOUNTER — Encounter: Payer: Self-pay | Admitting: Neurology

## 2014-10-01 ENCOUNTER — Ambulatory Visit (INDEPENDENT_AMBULATORY_CARE_PROVIDER_SITE_OTHER): Payer: BLUE CROSS/BLUE SHIELD | Admitting: Neurology

## 2014-10-01 VITALS — BP 162/92 | HR 72 | Resp 16 | Ht 63.0 in | Wt 205.8 lb

## 2014-10-01 DIAGNOSIS — N393 Stress incontinence (female) (male): Secondary | ICD-10-CM | POA: Diagnosis not present

## 2014-10-01 DIAGNOSIS — G4719 Other hypersomnia: Secondary | ICD-10-CM

## 2014-10-01 DIAGNOSIS — E7219 Other disorders of sulfur-bearing amino-acid metabolism: Secondary | ICD-10-CM | POA: Diagnosis not present

## 2014-10-01 DIAGNOSIS — IMO0001 Reserved for inherently not codable concepts without codable children: Secondary | ICD-10-CM

## 2014-10-01 DIAGNOSIS — G4733 Obstructive sleep apnea (adult) (pediatric): Secondary | ICD-10-CM | POA: Diagnosis not present

## 2014-10-01 DIAGNOSIS — F418 Other specified anxiety disorders: Secondary | ICD-10-CM

## 2014-10-01 DIAGNOSIS — M791 Myalgia: Secondary | ICD-10-CM | POA: Diagnosis not present

## 2014-10-01 DIAGNOSIS — R7989 Other specified abnormal findings of blood chemistry: Secondary | ICD-10-CM | POA: Insufficient documentation

## 2014-10-01 DIAGNOSIS — G35 Multiple sclerosis: Secondary | ICD-10-CM

## 2014-10-01 DIAGNOSIS — E7211 Homocystinuria: Secondary | ICD-10-CM

## 2014-10-01 DIAGNOSIS — Z9989 Dependence on other enabling machines and devices: Secondary | ICD-10-CM

## 2014-10-01 DIAGNOSIS — M609 Myositis, unspecified: Secondary | ICD-10-CM

## 2014-10-01 MED ORDER — OXYBUTYNIN CHLORIDE ER 10 MG PO TB24: 10.0000 mg | ORAL_TABLET | Freq: Every day | ORAL | Status: DC

## 2014-10-01 NOTE — Progress Notes (Signed)
[facility]  PATIENT: Pamela Walters DOB: 1961/02/26  REFERRING CLINICIAN: Tula Walters HISTORY FROM: Patient REASON FOR VISIT: Possible MS, fatigue, myalgias   HISTORICAL  CHIEF COMPLAINT:  Chief Complaint  Patient presents with  . Sleep Apnea    Sts. she has adjusted fairly well to cpap.  Sts. she goes to sleep pretty quickly, then sleeps at least 4-5 hrs. at a time.  She works 3rd shift, so her sleep habits vary depending on when she is working/fim  . Fatigue    Sts. she feels more rested during the day/fim  . Snoring    Sts. husband tells her she is no longer snoring/fim    HISTORY OF PRESENT ILLNESS:  She is a 54 year old woman who has probable MS, myalgias who was been diagnosed with obstructive sleep apnea and has been placed on CPAP therapy.  MS History: In  2006 and 2007 she presented with headaches. Headaches were bilateral and were associated with visual aura. Pain was throbbing and she had associated photophobia and phonophobia.  Pamela Walters underwent an MRI of the brain and an MR angiogram. The angiogram was normal but the MRI of the brain showed multiple white matter foci in a nonspecific pattern. She reports that Pamela Walters did a lumbar puncture and she was told that the results were abnormal, consistent with MS. She was placed on Betaseron but had difficulties tolerating it. She has not been on any DMT for her MS over the last 4 - 5 years.  She did not have any definite exacerbations.  Specifically, there were no episodes of numbness, weakness, clumsiness or visual change that came on over a short period of time.  A repeat MRI of the brain showed that the might of been some progression of the MRI changes. An MRI of the thoracic spine did not show any spinal lesions.   Gait/strength/sensation:   She denies any significant difficulty with her gait. She notes no weakness or numbness.  Bladder:  She has bladder frequency and urgency and received  some benefit from oxybutynin.  Vision:   She has noted some blurry vision, left  > right.   She will set up an appointment to see ophthalmology soon.  OSA:   I reviewed the PSG showing that she had severe OSA with AHI equals 37. She was titrated to CPAP 9 cm. I also reviewed a download that shows that her AHI is only 1.1 when she uses a CPAP. Compliance used at 47%. She has difficulty using the whole night due to her spirits shifts work schedule.    When she wears CPAP the whole night, she does feel less tired the next day.   She also has less nocturia when she uses CPAP.  Fatigue/sleep:   She notes that she tires out easily and will often get tired as her day goes on. The fatigue is more physical and mental.   Fatigue is always worse when she is hot. Even taking a warm shower will make her feel much more tired and wiped out. Fatigue has been a little better since starting CPAP.  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  Pain:   Myalgias are present in her legs and is moderate.   She takes Skelaxin and hydrocodone with some benefit.  She sees Pamela Walters and Pamela Walters for Pain.  She also has LBP and MRI  Shows facet hypertrophy and  spondylolisthesis at L4L5.     ESI injections have helped her pain a couple months at  Time.  Elevated homocysteine: She was started on Foltx and is tolerating it well.  Mood:  She has had mood swings and some irritability but no crying spells.  She sees Pamela Walters.   Symptoms improved on Prozac and Pamela Walters but she again weight with Pamela Walters.    DATA Review:   I reviewed her MRI images from 02/09/2013.  She has multiple white matter lesions and the majority are round and either subcortical or in the deep white matter.  A couple smaller ones are in the periventricular white matter.   There is symmetric hyperintensity in the mid midbrain.   I reviewed recent labwork showing normal vasculitis labs but elevated  homocysteine.     MRI of the thoracic spine at that time showed no spinal cord lesions. MRI of the lumbar spine shows degenerative changes at a worse at L4-L5. She has 4 mm of anterolisthesis at that level   EPWORTH SLEEPINESS SCALE  On a scale of 0 - 3 what is the chance of dozing:  Sitting and Reading:   2 Watching TV:    3 Sitting inactive in a public place: 1 Passenger in car for one hour: 3 Lying down to rest in the afternoon: 3 Sitting and talking to someone: 0 Sitting quietly after lunch:  3 In a car, stopped in traffic:  0  Total (out of 24):   15 (ws 18)   REVIEW OF SYSTEMS:  Constitutional: No fevers, chills, sweats, or change in appetite Eyes: No double vision,.   She notes eye pain Ear, nose and throat: No hearing loss, ear pain, nasal congestion, sore throat Cardiovascular: No chest pain, palpitations Respiratory:  No shortness of breath at rest or with exertion.   No wheezes GastrointestinaI: No nausea, vomiting, diarrhea, abdominal pain, fecal incontinence.   She has a hiatal hernia.  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   Genitourinary:  reports frequency and has 2 x nocturia.  No nocturia. Musculoskeletal:  No upper chest or  neck pain.  She reports lower back pain and has L4L5 DJD Integumentary: No rash, pruritus, skin lesions Neurological: as above Psychiatric: No depression at this time.  No anxiety Endocrine: Her weight is stable.   She has had hot flashes recently Hematologic/Lymphatic:  No anemia, purpura, petechiae. Allergic/Immunologic: No itchy/runny eyes, nasal congestion, recent allergic reactions, rashes  ALLERGIES: No Known Allergies  HOME MEDICATIONS: Outpatient Prescriptions Prior to Visit  Medication Sig Dispense Refill  . ARIPiprazole (Pamela Walters) 5 MG tablet Take 1 tablet (5 mg total) by mouth daily. 90 tablet 2  . atorvastatin (LIPITOR) 40 MG tablet Take 1 tablet (40 mg total) by mouth daily. 90 tablet 1  . FLUoxetine  (PROZAC) 40 MG capsule Take 1 capsule (40 mg total) by mouth daily. 90 capsule 1  . folic acid-pyridoxine-cyancobalamin (FOLTX) 2.5-25-2 MG TABS Take 1 tablet by mouth daily. 30 each 12  . gabapentin (NEURONTIN) 300 MG capsule One pill in am, one pill in evening and two at bedtime 120 capsule 11  . HYDROcodone-acetaminophen (NORCO/VICODIN) 5-325 MG per tablet Take 1 tablet by mouth as needed.     . metaxalone (SKELAXIN) 800 MG tablet Take 800 mg by mouth as needed.     . Multiple Vitamin (MULTIVITAMIN WITH MINERALS) TABS tablet Take 1 tablet by mouth daily.    Marland Kitchen oxybutynin (DITROPAN-XL) 5 MG 24 hr tablet  Take 1 tablet (5 mg total) by mouth at bedtime. 30 tablet 5  . pantoprazole (PROTONIX) 40 MG tablet Take 1 tablet (40 mg total) by mouth daily. 90 tablet 3  . tiZANidine (ZANAFLEX) 4 MG capsule Take 4 mg by mouth 2 (two) times daily as needed (back spasms).     . triamterene-hydrochlorothiazide (MAXZIDE-25) 37.5-25 MG per tablet Take 1 tablet by mouth daily. 90 tablet 1   No facility-administered medications prior to visit.    PAST MEDICAL HISTORY: Past Medical History  Diagnosis Date  . Hypertension   . Hyperlipidemia   . MS (multiple sclerosis)     2007  . Back pain   . Hiatal hernia   . Schatzki's ring     PAST SURGICAL HISTORY: Past Surgical History  Procedure Laterality Date  . Abdominal hysterectomy  2005    fibroids  . Esophagogastroduodenoscopy N/A 09/18/2013    Procedure: ESOPHAGOGASTRODUODENOSCOPY (EGD);  Surgeon: Jerene Bears, MD;  Location: [facility];  Service: Gastroenterology;  Laterality: N/A;    FAMILY HISTORY: Family History  Problem Relation Age of Onset  . Hypertension Mother   . Hyperlipidemia Mother   . Hypertension Father   . Colon cancer Neg Hx   . Esophageal cancer Neg Hx   . Rectal cancer Neg Hx   . Stomach cancer Neg Hx     SOCIAL HISTORY:  History   Social History  . Marital Status: Married    Spouse Name: N/A  . Number of Children:  N/A  . Years of Education: N/A   Occupational History  . Not on file.   Social History Main Topics  . Smoking status: Never Smoker   . Smokeless tobacco: Never Used  . Alcohol Use: No  . Drug Use: No  . Sexual Activity: Yes    Birth Control/ Protection: Surgical   Other Topics Concern  . Not on file   Social History Narrative     PHYSICAL EXAM  Filed Vitals:   10/01/14 1004  BP: 162/92  Pulse: 72  Resp: 16  Height: 5\' 3"  (1.6 m)  Weight: 205 lb 12.8 oz (93.35 kg)    Body mass index is 36.46 kg/(m^2).   General: The patient is well-developed and well-nourished and in no acute distress.  Affect is normal.  Skin: Extremities are without significant edema. No sclerodactyly is noted.  Musculoskeletal:   She has mild tenderness in the lower lumbar spine there is no tenderness in the muscles of the upper back or chest.   Neurologic Exam  Mental status: The patient is alert and oriented x 3 at the time of the examination. The patient has apparent normal recent and remote memory, with an apparently normal attention span and concentration ability.   Speech is normal.  Cranial nerves: Extraocular movements are full. Facial symmetry is present. There is good facial sensation to soft touch bilaterally.Facial strength is normal.  Trapezius and sternocleidomastoid strength is normal. No dysarthria is noted.  The tongue is midline, and the patient has symmetric elevation of the soft palate.   Motor:  Muscle bulk is normal. Tone was normal in the arms but slightly increased in the legs, a little more on the right. Strength is  5 / 5 in all 4 extremities.   Sensory: Sensory testing is intact to touch in all 4 extremities.  Coordination: Cerebellar testing reveals good finger-nose-finger   Gait and station: The station are stable with the eyes open but she was less stable with the  eyes closed. Gait was normal. Tandem walk was minimally wide.   Reflexes: Deep tendon reflexes are  very brisk in the legs and arms bilaterally.     DIAGNOSTIC DATA (LABS, IMAGING, TESTING) - I reviewed patient records, labs, notes, testing and imaging myself where available.  Lab Results  Component Value Date   WBC 8.9 09/25/2013   HGB 11.2* 09/25/2013   HCT 34.6* 09/25/2013   MCV 81.6 09/25/2013   PLT 287 09/25/2013      Component Value Date/Time   NA 140 05/17/2014 0933   K 3.8 05/17/2014 0933   CL 101 05/17/2014 0933   CO2 32 05/17/2014 0933   GLUCOSE 91 05/17/2014 0933   BUN 11 05/17/2014 0933   CREATININE 0.94 05/17/2014 0933   CREATININE 0.83 09/25/2013 0430   CALCIUM 9.6 05/17/2014 0933   PROT 7.0 05/17/2014 0933   ALBUMIN 4.2 05/17/2014 0933   AST 21 05/17/2014 0933   ALT 17 05/17/2014 0933   ALKPHOS 84 05/17/2014 0933   BILITOT 0.4 05/17/2014 0933   GFRNONAA 65 02/06/2014 1658   GFRNONAA 78* 09/25/2013 0430   GFRAA 75 02/06/2014 1658   GFRAA >90 09/25/2013 0430   Lab Results  Component Value Date   CHOL 159 05/17/2014   HDL 40 05/17/2014   LDLCALC 93 05/17/2014   TRIG 131 05/17/2014   CHOLHDL 4.0 05/17/2014   No results found for: HGBA1C No results found for: [id number] Lab Results  Component Value Date   TSH 2.732 05/17/2014       ASSESSMENT AND PLAN  MULTIPLE SCLEROSIS, PROGRESSIVE/RELAPSING  Obstructive sleep apnea  Myalgia and myositis  Stress incontinence  Excessive daytime sleepiness  Depression with anxiety  Increased homocysteine   1.   Encouraged continued use of CPAP. We discussed trying to increase her compliance to use more than 4 hours almost every night.  2.  Continue current medications including Foltx. She will continue to see for her other issues.  3.  Increase oxybutynin to twice a day.  4.  Return in 5-6 months or sooner if there are new or worsening neurologic symptoms.   Richard A. Felecia Shelling, MD, PhD 09/26/2242, 97:53 AM Certified in Neurology, Clinical Neurophysiology, Sleep Medicine, Pain Medicine and  Neuroimaging  [facility] [address], [place], [place] [address] [phone]

## 2014-11-28 ENCOUNTER — Ambulatory Visit (INDEPENDENT_AMBULATORY_CARE_PROVIDER_SITE_OTHER): Payer: BLUE CROSS/BLUE SHIELD | Admitting: Family Medicine

## 2014-11-28 ENCOUNTER — Encounter: Payer: Self-pay | Admitting: Family Medicine

## 2014-11-28 VITALS — BP 138/82 | HR 80 | Resp 16 | Ht 63.0 in | Wt 200.0 lb

## 2014-11-28 DIAGNOSIS — E785 Hyperlipidemia, unspecified: Secondary | ICD-10-CM | POA: Diagnosis not present

## 2014-11-28 DIAGNOSIS — R7309 Other abnormal glucose: Secondary | ICD-10-CM

## 2014-11-28 DIAGNOSIS — R7303 Prediabetes: Secondary | ICD-10-CM

## 2014-11-28 DIAGNOSIS — E669 Obesity, unspecified: Secondary | ICD-10-CM

## 2014-11-28 DIAGNOSIS — F418 Other specified anxiety disorders: Secondary | ICD-10-CM | POA: Diagnosis not present

## 2014-11-28 DIAGNOSIS — N39498 Other specified urinary incontinence: Secondary | ICD-10-CM

## 2014-11-28 DIAGNOSIS — I10 Essential (primary) hypertension: Secondary | ICD-10-CM

## 2014-11-28 DIAGNOSIS — G35 Multiple sclerosis: Secondary | ICD-10-CM

## 2014-11-28 LAB — CBC WITH DIFFERENTIAL/PLATELET
BASOS ABS: 0.1 10*3/uL (ref 0.0–0.1)
BASOS PCT: 1 % (ref 0–1)
Eosinophils Absolute: 0.1 10*3/uL (ref 0.0–0.7)
Eosinophils Relative: 2 % (ref 0–5)
HCT: 36.8 % (ref 36.0–46.0)
HEMOGLOBIN: 12 g/dL (ref 12.0–15.0)
Lymphocytes Relative: 26 % (ref 12–46)
Lymphs Abs: 1.9 10*3/uL (ref 0.7–4.0)
MCH: 25.2 pg — AB (ref 26.0–34.0)
MCHC: 32.6 g/dL (ref 30.0–36.0)
MCV: 77.1 fL — AB (ref 78.0–100.0)
MONO ABS: 0.6 10*3/uL (ref 0.1–1.0)
MPV: 9.2 fL (ref 8.6–12.4)
Monocytes Relative: 8 % (ref 3–12)
Neutro Abs: 4.5 10*3/uL (ref 1.7–7.7)
Neutrophils Relative %: 63 % (ref 43–77)
Platelets: 308 10*3/uL (ref 150–400)
RBC: 4.77 MIL/uL (ref 3.87–5.11)
RDW: 16.6 % — ABNORMAL HIGH (ref 11.5–15.5)
WBC: 7.2 10*3/uL (ref 4.0–10.5)

## 2014-11-28 LAB — COMPREHENSIVE METABOLIC PANEL
ALK PHOS: 68 U/L (ref 33–130)
ALT: 24 U/L (ref 6–29)
AST: 23 U/L (ref 10–35)
Albumin: 4 g/dL (ref 3.6–5.1)
BUN: 15 mg/dL (ref 7–25)
CALCIUM: 9.6 mg/dL (ref 8.6–10.4)
CHLORIDE: 102 mmol/L (ref 98–110)
CO2: 26 mmol/L (ref 20–31)
Creat: 0.8 mg/dL (ref 0.50–1.05)
GLUCOSE: 83 mg/dL (ref 65–99)
POTASSIUM: 4.3 mmol/L (ref 3.5–5.3)
Sodium: 140 mmol/L (ref 135–146)
TOTAL PROTEIN: 7 g/dL (ref 6.1–8.1)
Total Bilirubin: 0.5 mg/dL (ref 0.2–1.2)

## 2014-11-28 LAB — LIPID PANEL
Cholesterol: 205 mg/dL — ABNORMAL HIGH (ref 125–200)
HDL: 36 mg/dL — ABNORMAL LOW (ref >=46)
LDL Cholesterol: 136 mg/dL — ABNORMAL HIGH (ref <130)
Total CHOL/HDL Ratio: 5.7 Ratio — ABNORMAL HIGH (ref <=5.0)
Triglycerides: 163 mg/dL — ABNORMAL HIGH (ref <150)
VLDL: 33 mg/dL — ABNORMAL HIGH (ref <30)

## 2014-11-28 LAB — HEMOGLOBIN A1C
Hgb A1c MFr Bld: 5.9 % — ABNORMAL HIGH (ref <5.7)
Mean Plasma Glucose: 123 mg/dL — ABNORMAL HIGH (ref <117)

## 2014-11-28 MED ORDER — ATORVASTATIN CALCIUM 40 MG PO TABS: 40.0000 mg | ORAL_TABLET | Freq: Every day | ORAL | Status: DC

## 2014-11-28 NOTE — Patient Instructions (Addendum)
F/u in 4 month, call if you need me before   Fasting lipid, cmp and hBA1C, CBC today  You will be referred to a therapist,we will contact you

## 2014-12-10 ENCOUNTER — Telehealth (HOSPITAL_COMMUNITY): Payer: Self-pay | Admitting: *Deleted

## 2014-12-11 ENCOUNTER — Other Ambulatory Visit: Payer: Self-pay

## 2014-12-11 DIAGNOSIS — F418 Other specified anxiety disorders: Secondary | ICD-10-CM

## 2014-12-26 MED ORDER — ATORVASTATIN CALCIUM 80 MG PO TABS: 80.0000 mg | ORAL_TABLET | Freq: Every day | ORAL | Status: DC

## 2014-12-27 ENCOUNTER — Other Ambulatory Visit: Payer: Self-pay | Admitting: Family Medicine

## 2014-12-27 DIAGNOSIS — Z1231 Encounter for screening mammogram for malignant neoplasm of breast: Secondary | ICD-10-CM

## 2015-01-01 NOTE — Assessment & Plan Note (Signed)
Medication trial effective

## 2015-01-01 NOTE — Assessment & Plan Note (Signed)
Controlled, no change in medication DASH diet and commitment to daily physical activity for a minimum of 30 minutes discussed and encouraged, as a part of hypertension management. The importance of attaining a healthy weight is also discussed.  BP/Weight 11/28/2014 10/01/2014 08/16/2014 07/13/2014 05/30/2014 05/03/2014 50/08/3974  Systolic BP 734 193 93 790 240 973 532  Diastolic BP 82 92 54 68 80 80 75  Wt. (Lbs) 200 205.8 - 191.4 182.8 180 180  BMI 35.44 36.46 - 33.91 32.39 31.89 31.89  Some encounter information is confidential and restricted. Go to Review Flowsheets activity to see all data.

## 2015-01-01 NOTE — Progress Notes (Signed)
Pamela Walters     MRN: [id number]      DOB: 1961/02/15   HPI Pamela Walters is here for follow up and re-evaluation of chronic medical conditions, medication management and review of any available recent lab and radiology data.  Preventive health is updated, specifically  Cancer screening and Immunization.   Questions or concerns regarding consultations or procedures which the PT has had in the interim are  addressed. The PT denies any adverse reactions to current medications since the last visit.  There are no new concerns.  There are no specific complaints   ROS Denies recent fever or chills. Denies sinus pressure, nasal congestion, ear pain or sore throat. Denies chest congestion, productive cough or wheezing. Denies chest pains, palpitations and leg swelling Denies abdominal pain, nausea, vomiting,diarrhea or constipation.   Denies dysuria, frequency, hesitancy or incontinence. Denies joint pain, swelling and limitation in mobility. Denies headaches, seizures, numbness, or tingling. Denies depression, anxiety or insomnia. Denies skin break down or rash.   PE  BP 138/82 mmHg  Pulse 80  Resp 16  Ht 5\' 3"  (1.6 m)  Wt 200 lb (90.719 kg)  BMI 35.44 kg/m2  SpO2 96%  Patient alert and oriented and in no cardiopulmonary distress.  HEENT: No facial asymmetry, EOMI,   oropharynx pink and moist.  Neck supple no JVD, no mass.  Chest: Clear to auscultation bilaterally.  CVS: S1, S2 no murmurs, no S3.Regular rate.  ABD: Soft non tender.   Ext: No edema  MS: Adequate ROM spine, shoulders, hips and knees.  Skin: Intact, no ulcerations or rash noted.  Psych: Good eye contact, normal affect. Memory intact not anxious or depressed appearing.  CNS: CN 2-12 intact, power,  normal throughout.no focal deficits noted.   Assessment & Plan   Essential hypertension Controlled, no change in medication DASH diet and commitment to daily physical activity for a minimum of 30 minutes  discussed and encouraged, as a part of hypertension management. The importance of attaining a healthy weight is also discussed.  BP/Weight 11/28/2014 10/01/2014 08/16/2014 07/13/2014 05/30/2014 05/03/2014 19/10/5881  Systolic BP 254 982 93 641 583 094 076  Diastolic BP 82 92 54 68 80 80 75  Wt. (Lbs) 200 205.8 - 191.4 182.8 180 180  BMI 35.44 36.46 - 33.91 32.39 31.89 31.89  Some encounter information is confidential and restricted. Go to Review Flowsheets activity to see all data.        Hyperlipidemia LDL goal <100 Hyperlipidemia:Low fat diet discussed and encouraged.   Lipid Panel  Lab Results  Component Value Date   CHOL 205* 11/28/2014   HDL 36* 11/28/2014   LDLCALC 136* 11/28/2014   TRIG 163* 11/28/2014   CHOLHDL 5.7* 11/28/2014   Uncontrolled, needs to modify diet and lifestyle, will rept in 4 month   Depression with anxiety Good response to medication however needs therapy wants to change Provider as stated was unable to ventilate, so not very beneficial, will attempt to find alternate Provider  Obesity Deteriorated. Patient re-educated about  the importance of commitment to a  minimum of 150 minutes of exercise per week.  The importance of healthy food choices with portion control discussed. Encouraged to start a food diary, count calories and to consider  joining a support group. Sample diet sheets offered. Goals set by the patient for the next several months.   Weight /BMI 11/28/2014 10/01/2014 07/13/2014  WEIGHT 200 lb 205 lb 12.8 oz 191 lb 6.4 oz  HEIGHT 5\' 3"   5\' 3"  5\' 3"   BMI 35.44 kg/m2 36.46 kg/m2 33.91 kg/m2  Some encounter information is confidential and restricted. Go to Review Flowsheets activity to see all data.    Current exercise per week 40 mins   MULTIPLE SCLEROSIS, PROGRESSIVE/RELAPSING Stable on no medication ., followed by neurologist  Urinary incontinence Medication trial effective

## 2015-01-01 NOTE — Assessment & Plan Note (Signed)
Good response to medication however needs therapy wants to change Provider as stated was unable to ventilate, so not very beneficial, will attempt to find alternate Provider

## 2015-01-01 NOTE — Assessment & Plan Note (Signed)
Hyperlipidemia:Low fat diet discussed and encouraged.   Lipid Panel  Lab Results  Component Value Date   CHOL 205* 11/28/2014   HDL 36* 11/28/2014   LDLCALC 136* 11/28/2014   TRIG 163* 11/28/2014   CHOLHDL 5.7* 11/28/2014   Uncontrolled, needs to modify diet and lifestyle, will rept in 4 month

## 2015-01-01 NOTE — Assessment & Plan Note (Signed)
Stable on no medication ., followed by neurologist

## 2015-01-01 NOTE — Assessment & Plan Note (Signed)
Deteriorated. Patient re-educated about  the importance of commitment to a  minimum of 150 minutes of exercise per week.  The importance of healthy food choices with portion control discussed. Encouraged to start a food diary, count calories and to consider  joining a support group. Sample diet sheets offered. Goals set by the patient for the next several months.   Weight /BMI 11/28/2014 10/01/2014 07/13/2014  WEIGHT 200 lb 205 lb 12.8 oz 191 lb 6.4 oz  HEIGHT 5\' 3"  5\' 3"  5\' 3"   BMI 35.44 kg/m2 36.46 kg/m2 33.91 kg/m2  Some encounter information is confidential and restricted. Go to Review Flowsheets activity to see all data.    Current exercise per week 40 mins

## 2015-01-09 ENCOUNTER — Ambulatory Visit (HOSPITAL_COMMUNITY): Payer: Self-pay

## 2015-01-10 ENCOUNTER — Ambulatory Visit (HOSPITAL_COMMUNITY)
Admission: RE | Admit: 2015-01-10 | Discharge: 2015-01-10 | Disposition: A | Discharge status: Home / Self Care | Payer: BLUE CROSS/BLUE SHIELD | Source: Ambulatory Visit | Attending: Family Medicine | Admitting: Family Medicine

## 2015-01-10 ENCOUNTER — Encounter (HOSPITAL_COMMUNITY): Payer: Self-pay | Admitting: Psychiatry

## 2015-01-10 ENCOUNTER — Ambulatory Visit (INDEPENDENT_AMBULATORY_CARE_PROVIDER_SITE_OTHER): Payer: BLUE CROSS/BLUE SHIELD | Admitting: Psychiatry

## 2015-01-10 DIAGNOSIS — Z1231 Encounter for screening mammogram for malignant neoplasm of breast: Secondary | ICD-10-CM | POA: Diagnosis present

## 2015-01-10 DIAGNOSIS — F331 Major depressive disorder, recurrent, moderate: Secondary | ICD-10-CM | POA: Diagnosis not present

## 2015-01-10 NOTE — Progress Notes (Signed)
Patient:   Pamela Walters   DOB:   12-07-1960  MR Number:  [id number]  Location:  [address] [place], [place] [address]  Date of Service:   Thursday 01/10/2015  Start Time:   10:15 AM End Time:   11:15 AM  Provider/Observer:  Maurice Small, MSW, LCSW   Billing Code/Service:  [phone]  Chief Complaint:     Chief Complaint  Patient presents with  . Depression    Reason for Service:  Patient is referred for services by PCP Dr. Moshe Cipro due to patient suffering symptoms of depression. Patient reports several family issues.  She reports becoming very stressed and depressed last year when her 31 year old granddaughter attempted suicide. She continues to worry about granddaughter who resides with patient's daughter who did not reveal to patient that granddaughter was having any problems until the suicide attempt occurred.   Patient reports strained relationship with daughter as she and her husband have raised her daughter's oldest two children. Her daughter's third child was raised by another relative. Daughter has made poor choices and continues to to have difficulty making responsible choices which leads to constant tension in the relationship between patient and daughter per patient's report.  Patient's daughter lost her job last year due to legal issues and has been unable to find a job. Patient reports stress related to daughter now constantly asking for money. 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   Current Status:  Patient reports excessive worry and poor concentration.  Reliability of Information: Information gathered from patient and medical record.  Behavioral Observation: Pamela Walters  presents as a 54 y.o.-year-old Right-handed  Caucasian Female who appeared her stated age. Her dress was appropriate and she was wearing scrubs as she just completed shift at her job.  Her manners were appropriate to the situation.  There were  not any physical disabilities noted.  She displayed an appropriate level of cooperation and motivation.    Interactions:    Active   Attention:   normal  Memory:   within normal limits  Visuo-spatial:   not examined  Speech (Volume):  normal  Speech:   normal pitch and normal volume  Thought Process:  Coherent and Relevant  Though Content:  Rumination  Orientation:   person, place, time/date, situation, day of week, month of year and year  Judgment:   Good  Planning:   Good  Affect:    Appropriate  Mood:    Anxious  Insight:   Good  Intelligence:   normal  Marital Status/Living: Patient was born and raised in [place]. She is the youngest of two siblings. Parents were married. Patient describes household during childhood as enjoying childhood and states she was spoiled. She doesn't remember a lot of fussing or friction. Her mother died when she was 59 years old. After then, father became an alcoholic. Patient married when she was 75-years -old. She and husband have been married for 40 years and reside in [place]. They have a 65 year old daughter and a 18 year old son. Patient has six grandchildren and 1 great grandchild. Patient is Mina Marble and attends church on a regular basis. Patient normally likes to travel and go to beach.  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  Substance Use:  No concerns of substance abuse are reported.  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  Medical History:   Past Medical History  Diagnosis Date  . Hypertension   . Hyperlipidemia   . MS (multiple sclerosis)     2007  . Back pain   . Hiatal hernia   . Schatzki's ring     Sexual History:   History  Sexual Activity  . Sexual Activity: Yes  . Birth Control/ Protection: Surgical    Abuse/Trauma History: Denies  Psychiatric History:  Patient  reports no psychiatric hospitalizations. Patient saw Dr. Sima Matas briefly for outpatient therapy in 2015. Patient reports beginning to take psychotropic medication 5 years ago as prescribed by PCP Dr. [name].. She began seeing psychiatrist Dr. Harrington Challenger in 2015 who currently is providing medication management.  Family Med/Psych History:  Family History  Problem Relation Age of Onset  . Hypertension Mother   . Hyperlipidemia Mother   . Depression Mother   . Hypertension Father   . Colon cancer Neg Hx   . Esophageal cancer Neg Hx   . Rectal cancer Neg Hx   . Stomach cancer Neg Hx     Risk of Suicide/Violence: Patient denies any suicide attempts. Patient denies past and current suicidal and homicidal ideations. Patient reports no self-injurious behaviors and no patterns of aggression or violence.   Legal Issues:   None  Impression/DX:   Patient presents with a history of  symptoms of depression and anxiety and reports beginning to take psychotropic medication 5 years ago. Symptoms worsened last year when her granddaughter attempted suicide. She reports being less depressed since there has been  a change in medication but continuing to experience significant stress and anxiety related to continued concerns about granddaughter and continued family conflict. Patient's current symptoms include  excessive worry and poor concentration. Diagnosis:Major depressive disorder, recurrent episode, moderate  Disposition/Plan:  Patient attends the assessment appointment today. Confidentiality and limits are discussed. The patient agrees to call this practice, call 911, or have someone take her to the emergency room should symptoms worsen.  Diagnosis:    Axis I:  Major depressive disorder, recurrent episode, moderate      Axis II: No diagnosis       Axis III:   Past Medical History  Diagnosis Date  . Hypertension   . Hyperlipidemia   . MS (multiple sclerosis)     2007  . Back pain   . Hiatal  hernia   . Schatzki's ring         Axis IV:  occupational problems and problems with primary support group          Axis V:  51-60 moderate symptoms         Ferris Fielden, LCSW

## 2015-01-10 NOTE — Patient Instructions (Signed)
Discussed orally 

## 2015-01-25 ENCOUNTER — Ambulatory Visit (HOSPITAL_COMMUNITY): Payer: Self-pay | Admitting: Psychiatry

## 2015-01-29 ENCOUNTER — Encounter (HOSPITAL_COMMUNITY): Payer: Self-pay | Admitting: Psychiatry

## 2015-01-29 ENCOUNTER — Ambulatory Visit (INDEPENDENT_AMBULATORY_CARE_PROVIDER_SITE_OTHER): Payer: BLUE CROSS/BLUE SHIELD | Admitting: Psychiatry

## 2015-01-29 DIAGNOSIS — F331 Major depressive disorder, recurrent, moderate: Secondary | ICD-10-CM

## 2015-01-29 NOTE — Progress Notes (Signed)
   THERAPIST PROGRESS NOTE  Session Time: Tuesday 01/29/2015 10:05 AM - 11:05 AM  Participation Level: Active  Behavioral Response: CasualAlertAnxious and Depressed  Type of Therapy: Individual Therapy  Treatment Goals:  1. Learn and implement coping and behavioral strategies to overcome depression.     2. Verbalize an understanding and resolution of current interpersonal problems.     3. Learn and implement conflict resolution skills to resolve interpersonal problems.   Treatment Goals addressed: 1  Interventions: Supportive  Summary: Pamela Walters is a 54 y.o. female who is referred for services by PCP Dr. Moshe Cipro due to patient suffering symptoms of depression. Patient reports several family issues.  She reports becoming very stressed and depressed last year when her 60 year old granddaughter attempted suicide. She continues to worry about granddaughter who resides with patient's daughter who did not reveal to patient that granddaughter was having any problems until the suicide attempt occurred.  Patient reports strained relationship with daughter as she and her husband have raised her daughter's oldest two children. Her daughter's third child was raised by another relative. Daughter has made poor choices and continues to to have difficulty making responsible choices which leads to constant tension in the relationship between patient and daughter per patient's report.  Patient's daughter lost her job last year due to legal issues and has been unable to find a job. Patient reports stress related to daughter now constantly asking for money. 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   Patient reports little to no change in symptoms since last session. She rates both depression and anxiety at 5/10. She is experiencing increased fatigue as she has worked more hours to help out a co-worker who is having family issues. Patient reports  already having an erratic schedule as she works third shift and continues to have a poor sleep pattern.  She also reports irritability and emotional outbursts. Patient reports little involvement in activity besides going to work. She continues to experience stress related to helping out her daughter and grandchildren financially. She reports daughter continues to ask for huge sums of money. She expresses frustration with husband as he and patient are not in agreement regarding how to respond to daughter. Patient wants to set more boundaries with daughter.  Patient is hesitant to talk to husband regarding situation as it causes conflict. She admits tendency to internalize her feelings.    Suicidal/Homicidal: No  Therapist Response: Therapist works with patient to review symptoms, establish therapeutic alliance, identify strengths, identify supports, develop treatment plan, identify ways to improve self-care, discuss ways to use daily planning to try to achieve more balance in life regarding work and self-care.   Plan: Return again in 2 weeks.  Diagnosis: Axis I: Major Depressive Disorder, Recurrent, Moderate    Axis II: No diagnosis    Daniqua Campoy, LCSW 01/29/2015

## 2015-01-29 NOTE — Patient Instructions (Signed)
Discussed orally 

## 2015-02-12 ENCOUNTER — Ambulatory Visit (HOSPITAL_COMMUNITY): Payer: Self-pay | Admitting: Psychiatry

## 2015-02-13 ENCOUNTER — Encounter (HOSPITAL_COMMUNITY): Payer: Self-pay | Admitting: Psychiatry

## 2015-02-13 ENCOUNTER — Ambulatory Visit (INDEPENDENT_AMBULATORY_CARE_PROVIDER_SITE_OTHER): Payer: BLUE CROSS/BLUE SHIELD | Admitting: Psychiatry

## 2015-02-13 DIAGNOSIS — F331 Major depressive disorder, recurrent, moderate: Secondary | ICD-10-CM

## 2015-02-13 NOTE — Patient Instructions (Signed)
Discussed orally 

## 2015-02-13 NOTE — Progress Notes (Signed)
    THERAPIST PROGRESS NOTE  Session Time:   Wednesday 02/13/2015 11:02 AM - 12:00 PM   Participation Level: Active  Behavioral Response: CasualAlertAnxious and Depressed  Type of Therapy: Individual Therapy  Treatment Goals:  1. Learn and implement coping and behavioral strategies to overcome depression.     2. Verbalize an understanding and resolution of current interpersonal problems.     3. Learn and implement conflict resolution skills to resolve interpersonal problems.   Treatment Goals addressed: 1  Interventions: Supportive  Summary: Pamela Walters is a 54 y.o. female who is referred for services by PCP Dr. Moshe Cipro due to patient suffering symptoms of depression. Patient reports several family issues.  She reports becoming very stressed and depressed last year when her 16 year old granddaughter attempted suicide. She continues to worry about granddaughter who resides with patient's daughter who did not reveal to patient that granddaughter was having any problems until the suicide attempt occurred.  Patient reports strained relationship with daughter as she and her husband have raised her daughter's oldest two children. Her daughter's third child was raised by another relative. Daughter has made poor choices and continues to to have difficulty making responsible choices which leads to constant tension in the relationship between patient and daughter per patient's report.  Patient's daughter lost her job last year due to legal issues and has been unable to find a job. Patient reports stress related to daughter now constantly asking for money. 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   Patient reports continued stress related to daughter still asking for money and borrowing the car. Patient says daughter doesn't ask her for money any more but asks patient's husband who continues to five her money. Patient continues to express  frustration regarding this but tends to internalize her feeling as she does not want to have conflict with husband. She is pleased she and husband went to state fair yesterday. She states she felt better, thought more clearly, and had more energy. Patient has been using daily planning forms to record what she has done and now realizes she has little to no time for self. She continues to have poor eating patterns skipping meals and states sometimes just not being hungry. She also continues to have erratic sleep pattern and reports back pain and disruptions from family as reasons. Suicidal/Homicidal: No  Therapist Response: Therapist works with patient to review symptoms, process feelings, praise effort to go on outing and identify effects, provide psychoeducation regarding depression,  identify ways to improve eating patterns and sleep hygiene, identify ways to schedule time for self and activities to pursue for self time, identify potential blockers for self time and ways to address  Plan: Return again in 2 weeks.  Diagnosis: Axis I: Major Depressive Disorder, Recurrent, Moderate    Axis II: No diagnosis    Reynald Woods, LCSW 02/13/2015

## 2015-02-19 ENCOUNTER — Encounter (HOSPITAL_COMMUNITY): Payer: Self-pay | Admitting: Psychiatry

## 2015-02-19 ENCOUNTER — Ambulatory Visit (INDEPENDENT_AMBULATORY_CARE_PROVIDER_SITE_OTHER): Payer: BLUE CROSS/BLUE SHIELD | Admitting: Psychiatry

## 2015-02-19 VITALS — BP 156/81 | HR 76 | Ht 63.0 in | Wt 203.6 lb

## 2015-02-19 DIAGNOSIS — F331 Major depressive disorder, recurrent, moderate: Secondary | ICD-10-CM

## 2015-02-19 DIAGNOSIS — F329 Major depressive disorder, single episode, unspecified: Secondary | ICD-10-CM

## 2015-02-19 DIAGNOSIS — F332 Major depressive disorder, recurrent severe without psychotic features: Secondary | ICD-10-CM | POA: Diagnosis not present

## 2015-02-19 DIAGNOSIS — F32A Depression, unspecified: Secondary | ICD-10-CM

## 2015-02-19 MED ORDER — FLUOXETINE HCL 40 MG PO CAPS: 40.0000 mg | ORAL_CAPSULE | Freq: Every day | ORAL | Status: DC

## 2015-02-19 MED ORDER — FLUOXETINE HCL 20 MG PO CAPS: 20.0000 mg | ORAL_CAPSULE | Freq: Every day | ORAL | Status: DC

## 2015-02-19 NOTE — Progress Notes (Signed)
Patient ID: Pamela Walters, female   DOB: 1960-07-29, 54 y.o.   MRN: [id number] Patient ID: Pamela Walters, female   DOB: [birth year], 54 y.o.   MRN: [id number] Patient ID: Pamela Walters, female   DOB: 1961-04-02, 54 y.o.   MRN: [id number]  Psychiatric Assessment Adult  Patient Identification:  Pamela Walters Date of Evaluation:  02/19/2015 Chief Complaint: "I'm feeling better" History of Chief Complaint:   Chief Complaint  Patient presents with  . Depression  . Anxiety  . Follow-up    Depression        Past medical history includes anxiety.   Anxiety Symptoms include nervous/anxious behavior.     this patient is a 54 year old married white female who lives with her husband and 56 year old granddaughter in [place]. 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  The patient was referred by her primary physician, Dr. [name], for further treatment of anxiety and depression.  The patient states that she's always been a person who works very hard and tries to make the best of things. She's been the one in her family who takes care of everyone else. She often works double shifts at the nursing home when no one also come in and seems to be ultra responsible. A few years ago she was diagnosed with MS. She doesn't have very serious symptoms yet but it sometimes has weakness on one side of her body a problems holding her urine or visual changes. She was supposed to see a neurologist this year but has neglected to do so.  For the past 3-4 years she's been on Prozac and the dosage was increased to 40 mg last January. She is undergoing a lot of stress with her oldest daughter who is 71. This daughter is married to a man was verbally abusive and they fight all the time. They have 4 daughters and the patient and her husband have raised the oldest 38. The third daughter who is 54 is being raised by her sister and the youngest daughter who is 48 still lives with her  parents but is in great deal of distress. She had been cutting herself last fall and was admitted to the behavioral health hospital in [place].  The patient worries tremendously about her grandchildren and her daughter. She doesn't know what she can do to fix the situation. Last fall she swallowed a chicken bone and couldn't get it out and ended up with a ruptured esophagus and spent 2 weeks in the hospital. She claims she hasn't been the same since then. Her mood is somewhat low and she is given off a lot of her interests. She feels tired all the time and has little energy she does go to church but doesn't interact much with friends. She works all the time and as noted will sometimes take on extra shifts and feels responsible for her patients at the nursing home to the point of caring for them sometimes when she feels badly herself. She has never been suicidal and does not have psychotic symptoms. At times she feels anxious but does not have overt panic attacks. She does not use drugs or alcohol  The patient returns after 6 months. She went to [facility] for most of the summer to help her granddaughter with her new baby. She had a great time there and felt very relaxed and happy. Since she's been here and return to work she's been struggling with low  mood and low energy. She is upset because she's gained 23 pounds on the Abilify would like to get off of it. She also feels tired all the time which may be secondary to her MS. Other than urinary retention she's not had any recent symptoms. I suggested we go up on the Prozac and stopped the Abilify and she is in agreement. Review of Systems  Constitutional: Positive for activity change.  HENT: Negative.   Eyes: Positive for visual disturbance.  Respiratory: Negative.   Cardiovascular: Negative.   Gastrointestinal: Negative.   Endocrine: Negative.   Genitourinary: Positive for enuresis.  Musculoskeletal: Negative.   Neurological: Positive for  weakness.  Hematological: Negative.   Psychiatric/Behavioral: Positive for depression and dysphoric mood. The patient is nervous/anxious.    Physical Exam not done  Depressive Symptoms: depressed mood, anhedonia, hypersomnia, psychomotor retardation, fatigue, difficulty concentrating, anxiety, loss of energy/fatigue,  (Hypo) Manic Symptoms:   Elevated Mood:  No Irritable Mood:  No Grandiosity:  No Distractibility:  No Labiality of Mood:  No Delusions:  No Hallucinations:  No Impulsivity:  No Sexually Inappropriate Behavior:  No Financial Extravagance:  No Flight of Ideas:  No  Anxiety Symptoms: Excessive Worry:  Yes Panic Symptoms:  No Agoraphobia:  No Obsessive Compulsive: No  Symptoms: None, Specific Phobias:  No Social Anxiety:  No  Psychotic Symptoms:  Hallucinations: No None Delusions:  No Paranoia:  No   Ideas of Reference:  No  PTSD Symptoms: Ever had a traumatic exposure:  No Had a traumatic exposure in the last month:  No Re-experiencing: No None Hypervigilance:  No Hyperarousal: No None Avoidance: No None  Traumatic Brain Injury: No   Past Psychiatric History: Diagnosis: Depression   Hospitalizations: None   Outpatient Care: Only through primary care   Substance Abuse Care: none  Self-Mutilation: none  Suicidal Attempts: none  Violent Behaviors: none   Past Medical History:   Past Medical History  Diagnosis Date  . Hypertension   . Hyperlipidemia   . MS (multiple sclerosis) ([place])     2007  . Back pain   . Hiatal hernia   . Schatzki's ring    History of Loss of Consciousness:  No Seizure History:  No Cardiac History:  No Allergies:  No Known Allergies Current Medications:  Current Outpatient Prescriptions  Medication Sig Dispense Refill  . ARIPiprazole (ABILIFY) 5 MG tablet Take 1 tablet (5 mg total) by mouth daily. 90 tablet 2  . atorvastatin (LIPITOR) 80 MG tablet Take 1 tablet (80 mg total) by mouth daily. 90 tablet 1  .  FLUoxetine (PROZAC) 40 MG capsule Take 1 capsule (40 mg total) by mouth daily. 90 capsule 1  . folic acid-pyridoxine-cyancobalamin (FOLTX) 2.5-25-2 MG TABS Take 1 tablet by mouth daily. 30 each 12  . gabapentin (NEURONTIN) 300 MG capsule One pill in am, one pill in evening and two at bedtime 120 capsule 11  . HYDROcodone-acetaminophen (NORCO/VICODIN) 5-325 MG per tablet Take 1 tablet by mouth every 6 (six) hours as needed for moderate pain.    . metaxalone (SKELAXIN) 800 MG tablet Take 800 mg by mouth as needed.     . Multiple Vitamin (MULTIVITAMIN WITH MINERALS) TABS tablet Take 1 tablet by mouth daily.    Marland Kitchen oxybutynin (DITROPAN-XL) 10 MG 24 hr tablet Take 1 tablet (10 mg total) by mouth at bedtime. 90 tablet 3  . pantoprazole (PROTONIX) 40 MG tablet Take 1 tablet (40 mg total) by mouth daily. 90 tablet 3  . triamterene-hydrochlorothiazide (  MAXZIDE-25) 37.5-25 MG per tablet Take 1 tablet by mouth daily. 90 tablet 1  . FLUoxetine (PROZAC) 20 MG capsule Take 1 capsule (20 mg total) by mouth daily. 90 capsule 2   No current facility-administered medications for this visit.    Previous Psychotropic Medications:  Medication Dose                          Substance Abuse History in the last 12 months: Substance Age of 1st Use Last Use Amount Specific Type  Nicotine      Alcohol      Cannabis      Opiates      Cocaine      Methamphetamines      LSD      Ecstasy      Benzodiazepines      Caffeine      Inhalants      Others:                          Medical Consequences of Substance Abuse: none  Legal Consequences of Substance Abuse: none  Family Consequences of Substance Abuse: none  Blackouts:  No DT's:  No Withdrawal Symptoms:  No None  Social History: Current Place of Residence: [place] of Birth: [place] Family Members: Husband, daughter, son Marital Status:  Married Children:   Sons: 1  Daughters: 1 Relationships:   Education:  Dentist Problems/Performance:  Religious Beliefs/Practices: Christian History of Abuse: none Pensions consultant; Nurse, mental health History:  None. Legal History: None Hobbies/Interests: Reading  Family History:   Family History  Problem Relation Age of Onset  . Hypertension Mother   . Hyperlipidemia Mother   . Depression Mother   . Hypertension Father   . Colon cancer Neg Hx   . Esophageal cancer Neg Hx   . Rectal cancer Neg Hx   . Stomach cancer Neg Hx     Mental Status Examination/Evaluation: Objective:  Appearance: Casual and Fairly Groomed  Eye Contact::  Good  Speech:  Pressured  Volume:  Normal  Mood: Fairly good   Affect: A little anxious   Thought Process:  Coherent  Orientation:  Full (Time, Place, and Person)  Thought Content:  Rumination  Suicidal Thoughts:  No  Homicidal Thoughts:  No  Judgement:  Good  Insight:  Fair  Psychomotor Activity:  Normal  Akathisia:  No  Handed:  Right  AIMS (if indicated):    Assets:  Communication Skills Desire for Improvement Resilience Social Support Vocational/Educational    Laboratory/X-Ray Psychological Evaluation(s)   These were reviewed and did not indicate a physical reason for her fatigue other than the brain lesions consistent with MS      Assessment:  Axis I: Major Depression, Recurrent severe  AXIS I Major Depression, Recurrent severe  AXIS II Deferred  AXIS III Past Medical History  Diagnosis Date  . Hypertension   . Hyperlipidemia   . MS (multiple sclerosis) ([place])     2007  . Back pain   . Hiatal hernia   . Schatzki's ring      AXIS IV problems with primary support group  AXIS V 51-60 moderate symptoms   Treatment Plan/Recommendations:  Plan of Care: Medication management   Laboratory:   Psychotherapy: She is seeing Tera Mater here   Medications: She will increase Prozac to 60 mg daily. After she is on this new dosage for 2  weeks she'll stop the Abilify    Routine PRN Medications:  No  Consultations:   Safety Concerns:  She denies thoughts of harm to self or others   Other: She'll return in 4 weeks     [name], MD 10/25/201611:33 AM

## 2015-02-27 ENCOUNTER — Encounter (HOSPITAL_COMMUNITY): Payer: Self-pay | Admitting: Psychiatry

## 2015-02-27 ENCOUNTER — Ambulatory Visit (INDEPENDENT_AMBULATORY_CARE_PROVIDER_SITE_OTHER): Payer: BLUE CROSS/BLUE SHIELD | Admitting: Psychiatry

## 2015-02-27 DIAGNOSIS — F331 Major depressive disorder, recurrent, moderate: Secondary | ICD-10-CM

## 2015-02-27 NOTE — Patient Instructions (Signed)
Discussed orally 

## 2015-02-27 NOTE — Progress Notes (Signed)
    THERAPIST PROGRESS NOTE  Session Time:   Wednesday 02/27/2015  10:05 AM -  11:00 AM      Participation Level: Active  Behavioral Response: CasualAlertAnxious and Depressed  Type of Therapy: Individual Therapy  Treatment Goals:  1. Learn and implement coping and behavioral strategies to overcome depression.     2. Verbalize an understanding and resolution of current interpersonal problems.     3. Learn and implement conflict resolution skills to resolve interpersonal problems.   Treatment Goals addressed: 1  Interventions: Supportive  Summary: Pamela Walters is a 54 y.o. female who is referred for services by PCP Dr. Moshe Cipro due to patient suffering symptoms of depression. Patient reports several family issues.  She reports becoming very stressed and depressed last year when her 31 year old granddaughter attempted suicide. She continues to worry about granddaughter who resides with patient's daughter who did not reveal to patient that granddaughter was having any problems until the suicide attempt occurred.  Patient reports strained relationship with daughter as she and her husband have raised her daughter's oldest two children. Her daughter's third child was raised by another relative. Daughter has made poor choices and continues to to have difficulty making responsible choices which leads to constant tension in the relationship between patient and daughter per patient's report.  Patient's daughter lost her job last year due to legal issues and has been unable to find a job. Patient reports stress related to daughter now constantly asking for money. 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   Patient reports continued stress related to daughter. She says daughter has not spoken to her since last week when patient set boundaries with her regarding daughter calling or stopping by during patient's sleep time. She expresses frustration  daughter still has patient's car and has not contacted patient or husband about returning it. Patient continues to express frustration with husband and still avoid communicating with him about their daughter as she does not want to have an argument. She continues to experience fatigue, down mood, and reports increased knee pain. She has increased effort to improve self-care and reports trying to get more rest. She still has to push self to do household tasks. She also reports always thinking about what she needs to do and has difficulty relaxing. Suicidal/Homicidal: No  Therapist Response: Therapist works with patient to review symptoms, identify and verbalize feelings regarding interaction with husband regarding daughter and the effects on their marriage, praise and reinforce patient's use of assertiveness skills with daughter, discuss rationale and practice mindfulness exercise using breath awareness, explore relaxation techniques , encourage patient to journal   Plan: Return again in 2 weeks.  Diagnosis: Axis I: Major Depressive Disorder, Recurrent, Moderate    Axis II: No diagnosis    BYNUM,PEGGY, LCSW 02/27/2015

## 2015-03-04 ENCOUNTER — Ambulatory Visit (INDEPENDENT_AMBULATORY_CARE_PROVIDER_SITE_OTHER): Payer: BLUE CROSS/BLUE SHIELD | Admitting: Neurology

## 2015-03-04 ENCOUNTER — Encounter: Payer: Self-pay | Admitting: Neurology

## 2015-03-04 VITALS — BP 146/88 | HR 68 | Resp 14 | Ht 63.0 in | Wt 201.4 lb

## 2015-03-04 DIAGNOSIS — R350 Frequency of micturition: Secondary | ICD-10-CM | POA: Diagnosis not present

## 2015-03-04 DIAGNOSIS — R208 Other disturbances of skin sensation: Secondary | ICD-10-CM

## 2015-03-04 DIAGNOSIS — F418 Other specified anxiety disorders: Secondary | ICD-10-CM | POA: Diagnosis not present

## 2015-03-04 DIAGNOSIS — G35 Multiple sclerosis: Secondary | ICD-10-CM

## 2015-03-04 DIAGNOSIS — E7219 Other disorders of sulfur-bearing amino-acid metabolism: Secondary | ICD-10-CM

## 2015-03-04 DIAGNOSIS — E7211 Homocystinuria: Secondary | ICD-10-CM

## 2015-03-04 DIAGNOSIS — G35D Multiple sclerosis, unspecified: Secondary | ICD-10-CM

## 2015-03-04 DIAGNOSIS — R5383 Other fatigue: Secondary | ICD-10-CM

## 2015-03-04 DIAGNOSIS — G4733 Obstructive sleep apnea (adult) (pediatric): Secondary | ICD-10-CM

## 2015-03-04 DIAGNOSIS — R7983 Abnormal findings of blood amino-acid level: Secondary | ICD-10-CM

## 2015-03-04 MED ORDER — GABAPENTIN 600 MG PO TABS: 600.0000 mg | ORAL_TABLET | Freq: Three times a day (TID) | ORAL | Status: DC

## 2015-03-04 MED ORDER — FA-PYRIDOXINE-CYANOCOBALAMIN 2.5-25-2 MG PO TABS: 1.0000 | ORAL_TABLET | Freq: Every day | ORAL | Status: DC

## 2015-03-04 NOTE — Progress Notes (Signed)
[facility]  PATIENT: Pamela Walters DOB: 1961-04-04  REFERRING CLINICIAN: [name] HISTORY FROM: Patient REASON FOR VISIT: Possible MS, fatigue, myalgias   HISTORICAL  CHIEF COMPLAINT:  Chief Complaint  Patient presents with  . Multiple Sclerosis    Not on any MS therapy.  Sts. she is compliant with CPAP.  She works 3rd shift, so sometimes it is difficult/fim  . Sleep Apnea    HISTORY OF PRESENT ILLNESS:  She is a 54 year old woman who has probable MS, myalgias who was been diagnosed with obstructive sleep apnea and has been placed on CPAP therapy.  She continues to be off any disease modifying therapy for her multiple sclerosis. I had reviewed her MRIs at the last visit. Most of the foci are nonspecific though a few are periventricular. Last MRI was about 2 years ago.  She does not believe she has had any exacerbations.  Gait/strength/sensation:   She fell once in the yard and is not sure what happened.   She denies any significant difficulty with her gait. She notes no weakness or numbness.    She takes gabapentin for nerve/musclee pain and gets a good benefit.   She tolerates it well.     Bladder:  She has bladder frequency and urgency helped by oxybutynin.  Vision:   She has worse depth perception and left vision.   She will set up an appointment to see ophthalmology soon.  OSA:  She has severe OSA with AHI = 37. She was titrated to CPAP 9 cm.  Most recent download shows that her AHI = 1.1 when she uses a CPAP.  She tries to wear every night and is doing bette than in the past.     When she wears CPAP the whole night, she does feel less tired the next day.   She has also noted less nocturia when she uses CPAP.  Fatigue/sleep/mood:   She notes that she fatigues as her day goes on.   Working 8 hour vs. 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 The fatigue is more physical and mental.   Fatigue is always worse when she is  hot.    Fatigue has been a little better since starting CPAP.  She has irregular sleep due to shift work.  She has had mood swings and some irritability but no crying spells.  She sees Dr. Sima Matas.   She is gaining weight on Abilify but current regimen is helping mood a lot.  Pain:   Myalgias are present in her legs and is moderate.   She takes Skelaxin and hydrocodone with some benefit.  She sees Dr. Rolena Infante and Dr. Nelva Bush for Pain.  She also has LBP from facet hypertrophy and spondylolisthesis at L4L5.     ESI injections have helped in past.  MS History: In  2006 and 2007 she presented with headaches. Headaches were bilateral and were associated with visual aura. Pain was throbbing and she had associated photophobia and phonophobia.  Mrs. Kinderman underwent an MRI of the brain and an MR angiogram. The angiogram was normal but the MRI of the brain showed multiple white matter foci in a nonspecific pattern. She reports that Dr. Doy Mince did a lumbar puncture and she was told that the results were abnormal, consistent with MS. She was placed on Betaseron but had difficulties tolerating it. She has not been on any DMT for her MS over the last 4 - 5 years.  She did not have any definite exacerbations.  Specifically, there were no episodes of numbness, weakness, clumsiness or visual change that came on over a short period of time.  A repeat MRI of the brain showed that the might of been some progression of the MRI changes. An MRI of the thoracic spine did not show any spinal lesions.    DATA :   I reviewed her MRI images from 02/09/2013.  She has multiple white matter lesions and the majority are round and either subcortical or in the deep white matter.  A couple smaller ones are in the periventricular white matter.   There is symmetric hyperintensity in the mid midbrain.   I reviewed recent labwork showing normal vasculitis labs but elevated homocysteine.     MRI of the thoracic spine at that time showed no  spinal cord lesions. MRI of the lumbar spine shows degenerative changes at a worse at L4-L5. She has 4 mm of anterolisthesis at that level    REVIEW OF SYSTEMS:  Constitutional: No fevers, chills, sweats, or change in appetite Eyes: No double vision,.   She notes eye pain Ear, nose and throat: No hearing loss, ear pain, nasal congestion, sore throat Cardiovascular: No chest pain, palpitations Respiratory:  No shortness of breath at rest or with exertion.   No wheezes GastrointestinaI: No nausea, vomiting, diarrhea, abdominal pain, fecal incontinence.   She has a hiatal hernia.  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   Genitourinary:  reports frequency and has 2 x nocturia.  No nocturia. Musculoskeletal:  No upper chest or  neck pain.  She reports lower back pain and has L4L5 DJD Integumentary: No rash, pruritus, skin lesions Neurological: as above Psychiatric: No depression at this time.  No anxiety Endocrine: Her weight is stable.   She has had hot flashes recently Hematologic/Lymphatic:  No anemia, purpura, petechiae. Allergic/Immunologic: No itchy/runny eyes, nasal congestion, recent allergic reactions, rashes  ALLERGIES: No Known Allergies  HOME MEDICATIONS: Outpatient Prescriptions Prior to Visit  Medication Sig Dispense Refill  . ARIPiprazole (ABILIFY) 5 MG tablet Take 1 tablet (5 mg total) by mouth daily. 90 tablet 2  . atorvastatin (LIPITOR) 80 MG tablet Take 1 tablet (80 mg total) by mouth daily. 90 tablet 1  . FLUoxetine (PROZAC) 20 MG capsule Take 1 capsule (20 mg total) by mouth daily. 90 capsule 2  . FLUoxetine (PROZAC) 40 MG capsule Take 1 capsule (40 mg total) by mouth daily. 90 capsule 1  . folic acid-pyridoxine-cyancobalamin (FOLTX) 2.5-25-2 MG TABS Take 1 tablet by mouth daily. 30 each 12  . gabapentin (NEURONTIN) 300 MG capsule One pill in am, one pill in evening and two at bedtime 120 capsule 11  . HYDROcodone-acetaminophen (NORCO/VICODIN) 5-325 MG  per tablet Take 1 tablet by mouth every 6 (six) hours as needed for moderate pain.    . metaxalone (SKELAXIN) 800 MG tablet Take 800 mg by mouth as needed.     . Multiple Vitamin (MULTIVITAMIN WITH MINERALS) TABS tablet Take 1 tablet by mouth daily.    Marland Kitchen oxybutynin (DITROPAN-XL) 10 MG 24 hr tablet Take 1 tablet (10 mg total) by mouth at bedtime. 90 tablet 3  . pantoprazole (PROTONIX) 40 MG tablet Take 1 tablet (40 mg total) by mouth daily. 90 tablet 3  . triamterene-hydrochlorothiazide (MAXZIDE-25) 37.5-25 MG per tablet Take 1 tablet by mouth daily. 90 tablet 1   No facility-administered medications prior to visit.    PAST MEDICAL HISTORY: Past  Medical History  Diagnosis Date  . Hypertension   . Hyperlipidemia   . MS (multiple sclerosis) ([place])     2007  . Back pain   . Hiatal hernia   . Schatzki's ring     PAST SURGICAL HISTORY: Past Surgical History  Procedure Laterality Date  . Abdominal hysterectomy  2005    fibroids  . Esophagogastroduodenoscopy N/A 09/18/2013    Procedure: ESOPHAGOGASTRODUODENOSCOPY (EGD);  Surgeon: Jerene Bears, MD;  Location: [facility];  Service: Gastroenterology;  Laterality: N/A;    FAMILY HISTORY: Family History  Problem Relation Age of Onset  . Hypertension Mother   . Hyperlipidemia Mother   . Depression Mother   . Hypertension Father   . Colon cancer Neg Hx   . Esophageal cancer Neg Hx   . Rectal cancer Neg Hx   . Stomach cancer Neg Hx     SOCIAL HISTORY:  Social History   Social History  . Marital Status: Married    Spouse Name: N/A  . Number of Children: N/A  . Years of Education: N/A   Occupational History  . Not on file.   Social History Main Topics  . Smoking status: Never Smoker   . Smokeless tobacco: Never Used  . Alcohol Use: No  . Drug Use: No  . Sexual Activity: Yes    Birth Control/ Protection: Surgical   Other Topics Concern  . Not on file   Social History Narrative     PHYSICAL EXAM  Filed Vitals:    03/04/15 1321  BP: 146/88  Pulse: 68  Resp: 14  Height: 5\' 3"  (1.6 m)  Weight: 201 lb 6.4 oz (91.354 kg)    Body mass index is 35.69 kg/(m^2).   General: The patient is well-developed and well-nourished and in no acute distress.  Affect is normal.   Musculoskeletal:   She has mild tenderness in the lower lumbar spine there is no tenderness in the muscles of the upper back or chest.   Neurologic Exam  Mental status: The patient is alert and oriented x 3 at the time of the examination. The patient has apparent normal recent and remote memory, with an apparently normal attention span and concentration ability.   Speech is normal.  Cranial nerves: Extraocular movements are full. Facial symmetry is present. There is good facial sensation to soft touch bilaterally.Facial strength is normal.  Trapezius and sternocleidomastoid strength is normal. No dysarthria is noted.     Motor:  Muscle bulk is normal. Tone was normal in the arms but slightly increased in the legs, a little more on the right. Strength is  5 / 5 in all 4 extremities.   Sensory: Sensory testing is intact to touch in all 4 extremities.  Coordination: Cerebellar testing reveals good finger-nose-finger   Gait and station: The station are stable with the eyes open but she was less stable with the eyes closed. Gait was normal. Tandem walk was minimally wide.   Reflexes: Deep tendon reflexes are very brisk in the legs and arms bilaterally.     DIAGNOSTIC DATA (LABS, IMAGING, TESTING) - I reviewed patient records, labs, notes, testing and imaging myself where available.  Lab Results  Component Value Date   WBC 7.2 11/28/2014   HGB 12.0 11/28/2014   HCT 36.8 11/28/2014   MCV 77.1* 11/28/2014   PLT 308 11/28/2014      Component Value Date/Time   NA 140 11/28/2014 1000   K 4.3 11/28/2014 1000   CL 102  11/28/2014 1000   CO2 26 11/28/2014 1000   GLUCOSE 83 11/28/2014 1000   BUN 15 11/28/2014 1000   CREATININE 0.80  11/28/2014 1000   CREATININE 0.83 09/25/2013 0430   CALCIUM 9.6 11/28/2014 1000   PROT 7.0 11/28/2014 1000   ALBUMIN 4.0 11/28/2014 1000   AST 23 11/28/2014 1000   ALT 24 11/28/2014 1000   ALKPHOS 68 11/28/2014 1000   BILITOT 0.5 11/28/2014 1000   GFRNONAA 65 02/06/2014 1658   GFRNONAA 78* 09/25/2013 0430   GFRAA 75 02/06/2014 1658   GFRAA >90 09/25/2013 0430   Lab Results  Component Value Date   CHOL 205* 11/28/2014   HDL 36* 11/28/2014   LDLCALC 136* 11/28/2014   TRIG 163* 11/28/2014   CHOLHDL 5.7* 11/28/2014   Lab Results  Component Value Date   HGBA1C 5.9* 11/28/2014   No results found for: VITAMINB12 Lab Results  Component Value Date   TSH 2.732 05/17/2014       ASSESSMENT AND PLAN  Obstructive sleep apnea  Depression with anxiety  Other fatigue  Urinary frequency  Dysesthesia - Plan: MR Brain W Wo Contrast  Multiple sclerosis ([place]) - Plan: MR Brain W Wo Contrast  Homocysteinemia ([place]) - Plan: folic acid-pyridoxine-cyancobalamin (FOLTX) 2.5-25-2 MG TABS tablet   1.   MRI of the brain with and without contrast to rule out subclinical progression. If present, I will encourage her to restart a disease modifying therapy.   Ativan, 3 pills provided to help with her claustrophobia for the MRI.  She understands that she cannot drive to or from the MRI appointment. 2.   Encouraged continued use of CPAP. We discussed trying to increase her compliance to use more than 4 hours almost every night.  3.  Continue current medications . She will continue to see Psych and Pain management for her other issues.   4.  Return in 5-6 months or sooner if there are new or worsening neurologic symptoms.   Cowan Pilar A. Felecia Shelling, MD, PhD 32/04/2246, 2:50 PM Certified in Neurology, Clinical Neurophysiology, Sleep Medicine, Pain Medicine and Neuroimaging  [facility] [address] [place], [place] [address] [phone]

## 2015-03-11 ENCOUNTER — Telehealth (HOSPITAL_COMMUNITY): Payer: Self-pay | Admitting: *Deleted

## 2015-03-11 NOTE — Telephone Encounter (Signed)
lmtcb due to provider being out of office on Nov 16. Number provided.

## 2015-03-13 ENCOUNTER — Ambulatory Visit (HOSPITAL_COMMUNITY): Payer: Self-pay | Admitting: Psychiatry

## 2015-03-13 ENCOUNTER — Telehealth (HOSPITAL_COMMUNITY): Payer: Self-pay | Admitting: *Deleted

## 2015-03-19 ENCOUNTER — Ambulatory Visit (HOSPITAL_COMMUNITY): Payer: Self-pay | Admitting: Psychiatry

## 2015-03-27 ENCOUNTER — Encounter (HOSPITAL_COMMUNITY): Payer: Self-pay | Admitting: Psychiatry

## 2015-03-27 ENCOUNTER — Ambulatory Visit (INDEPENDENT_AMBULATORY_CARE_PROVIDER_SITE_OTHER): Payer: BLUE CROSS/BLUE SHIELD

## 2015-03-27 ENCOUNTER — Ambulatory Visit (INDEPENDENT_AMBULATORY_CARE_PROVIDER_SITE_OTHER): Payer: BLUE CROSS/BLUE SHIELD | Admitting: Psychiatry

## 2015-03-27 DIAGNOSIS — F331 Major depressive disorder, recurrent, moderate: Secondary | ICD-10-CM | POA: Diagnosis not present

## 2015-03-27 DIAGNOSIS — R208 Other disturbances of skin sensation: Secondary | ICD-10-CM | POA: Diagnosis not present

## 2015-03-27 DIAGNOSIS — G35 Multiple sclerosis: Secondary | ICD-10-CM | POA: Diagnosis not present

## 2015-03-27 MED ORDER — GADOPENTETATE DIMEGLUMINE 469.01 MG/ML IV SOLN: 20.0000 mL | Freq: Once | INTRAVENOUS | Status: AC | PRN

## 2015-03-27 NOTE — Patient Instructions (Signed)
Discussed orally 

## 2015-03-27 NOTE — Progress Notes (Signed)
     THERAPIST PROGRESS NOTE  Session Time:   Wednesday 03/27/2015  10:12 AM - 11:11 AM    Participation Level: Active  Behavioral Response: CasualAlertAnxious and Depressed  Type of Therapy: Individual Therapy  Treatment Goals:  1. Learn and implement coping and behavioral strategies to overcome depression.     2. Verbalize an understanding and resolution of current interpersonal problems.     3. Learn and implement conflict resolution skills to resolve interpersonal problems.   Treatment Goals addressed: 1,2  Interventions: Supportive  Summary: Pamela Walters is a 54 y.o. female who is referred for services by PCP Dr. Moshe Cipro due to patient suffering symptoms of depression. Patient reports several family issues.  She reports becoming very stressed and depressed last year when her 53 year old granddaughter attempted suicide. She continues to worry about granddaughter who resides with patient's daughter who did not reveal to patient that granddaughter was having any problems until the suicide attempt occurred.  Patient reports strained relationship with daughter as she and her husband have raised her daughter's oldest two children. Her daughter's third child was raised by another relative. Daughter has made poor choices and continues to to have difficulty making responsible choices which leads to constant tension in the relationship between patient and daughter per patient's report.  Patient's daughter lost her job last year due to legal issues and has been unable to find a job. Patient reports stress related to daughter now constantly asking for money. 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   Patient reports continued stress related to daughter. She reports recent incident regarding daughter making an unhealthy decision and shares many instances of daughter's pattern of making unwise choices. Patient continues to worry about her  daughter and grandchildren's welfare. She also worries about other's opinions and the effects on her grandchildren. She and her husband have had improved communication and patient feels positive about this. She says daughter has not been asking for financial assistance lately. Patient expresses frustration regarding 25 pound weight gain but has discussed with psychiatrist with Dr.Ross. Patient says plan is to eventually discontinue Abilify.   Therapist Response: Therapist works with patient to review symptoms, facilitate expression of grief regarding lost dreams related to expectations patient had of daughter, identify ways to improve self-care regarding eating patterns and exercise, identify coping statements  Plan: Return again in 2 weeks.  Diagnosis: Axis I: Major Depressive Disorder, Recurrent, Moderate    Axis II: No diagnosis    [name], LCSW 03/27/2015

## 2015-04-01 NOTE — Telephone Encounter (Signed)
-----   Message from Britt Bottom, MD sent at 03/31/2015  6:19 PM EST ----- Please let her know MRI shows no changes compared to 2014

## 2015-04-01 NOTE — Telephone Encounter (Signed)
I have spoken with Pamela Walters this afternoon and per RAS, advised that mri shows no changes when compared to the mri she had in 2014.  She verbalized understanding of same/fim

## 2015-04-10 ENCOUNTER — Ambulatory Visit (HOSPITAL_COMMUNITY): Payer: Self-pay | Admitting: Psychiatry

## 2015-04-16 ENCOUNTER — Ambulatory Visit (INDEPENDENT_AMBULATORY_CARE_PROVIDER_SITE_OTHER): Payer: BLUE CROSS/BLUE SHIELD | Admitting: Psychiatry

## 2015-04-16 ENCOUNTER — Encounter (HOSPITAL_COMMUNITY): Payer: Self-pay | Admitting: Psychiatry

## 2015-04-16 DIAGNOSIS — F331 Major depressive disorder, recurrent, moderate: Secondary | ICD-10-CM | POA: Diagnosis not present

## 2015-04-16 NOTE — Patient Instructions (Signed)
Discussed oraally

## 2015-04-16 NOTE — Progress Notes (Signed)
      THERAPIST PROGRESS NOTE  Session Time:    Tuesday 04/16/2015  10:00 AM -  11:00 AM                         Participation Level: Active  Behavioral Response: CasualAlertAnxious and Depressed  Type of Therapy: Individual Therapy  Treatment Goals:  1. Learn and implement coping and behavioral strategies to overcome depression.     2. Verbalize an understanding and resolution of current interpersonal problems.     3. Learn and implement conflict resolution skills to resolve interpersonal problems.   Treatment Goals addressed: 1,2  Interventions: Supportive  Summary: Pamela Walters is a 54 y.o. female who is referred for services by PCP Dr. Moshe Cipro due to patient suffering symptoms of depression. Patient reports several family issues.  She reports becoming very stressed and depressed last year when her 58 year old granddaughter attempted suicide. She continues to worry about granddaughter who resides with patient's daughter who did not reveal to patient that granddaughter was having any problems until the suicide attempt occurred.  Patient reports strained relationship with daughter as she and her husband have raised her daughter's oldest two children. Her daughter's third child was raised by another relative. Daughter has made poor choices and continues to to have difficulty making responsible choices which leads to constant tension in the relationship between patient and daughter per patient's report.  Patient's daughter lost her job last year due to legal issues and has been unable to find a job. Patient reports stress related to daughter now constantly asking for money. 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   Patient reports increased stress and anxiety. 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 She is experiencing increased fatigue and admits having little time for resting or taking care of self. She  also is stressed about upcoming holidays as she fears son-in-law's possible negative actions at the family get together as he has a pattern of this type behavior at family events.  She also continues to worry about daughter as daughter and son-in-law continue to have marital issues and daughter is ambivalent about leaving the marriage. Dauglter also recently experienced negative interaction with in-laws.  Therapist Response: Therapist works with patient to review symptoms, continue to  facilitate expression of grief regarding lost dreams related to expectations patient had of daughter, explore thoughts and reasons inhibiting patient's efforts to improve self-care, identify alternative thought patterns to promote self care, identify 3 ways patient will improve self-care for the next week ( reduce work hours, journal, listen to music), discuss mindfulness techniques to reduce/manage anxiety regarding eating patterns and exercise, identify coping statements  Plan: Return again in 1 week.   Diagnosis: Axis I: Major Depressive Disorder, Recurrent, Moderate    Axis II: No diagnosis    Kynzee Devinney, LCSW 04/16/2015

## 2015-04-24 ENCOUNTER — Ambulatory Visit (INDEPENDENT_AMBULATORY_CARE_PROVIDER_SITE_OTHER): Payer: BLUE CROSS/BLUE SHIELD | Admitting: Psychiatry

## 2015-04-24 DIAGNOSIS — F331 Major depressive disorder, recurrent, moderate: Secondary | ICD-10-CM

## 2015-04-24 NOTE — Patient Instructions (Signed)
Discussed orally 

## 2015-04-24 NOTE — Progress Notes (Signed)
      THERAPIST PROGRESS NOTE  Session Time:    Wednesday 04/24/2015  10:15 AM -  11:10 AM                                Participation Level: Active  Behavioral Response: CasualAlertAnxious and Depressed  Type of Therapy: Individual Therapy  Treatment Goals:  1. Learn and implement coping and behavioral strategies to overcome depression.     2. Verbalize an understanding and resolution of current interpersonal problems.     3. Learn and implement conflict resolution skills to resolve interpersonal problems.   Treatment Goals addressed: 1,2  Interventions: Supportive, CBT  Summary: Pamela Walters is a 54 y.o. female who is referred for services by PCP Dr. Moshe Cipro due to patient suffering symptoms of depression. Patient reports several family issues.  She reports becoming very stressed and depressed last year when her 56 year old granddaughter attempted suicide. She continues to worry about granddaughter who resides with patient's daughter who did not reveal to patient that granddaughter was having any problems until the suicide attempt occurred.  Patient reports strained relationship with daughter as she and her husband have raised her daughter's oldest two children. Her daughter's third child was raised by another relative. Daughter has made poor choices and continues to to have difficulty making responsible choices which leads to constant tension in the relationship between patient and daughter per patient's report.  Patient's daughter lost her job last year due to legal issues and has been unable to find a job. Patient reports stress related to daughter now constantly asking for money. 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   Patient reports decreased stress and anxiety since last session. She reports holiday family gathering was well as son-in-law did not attend. However, she expresses sadness and frustration regarding  daughter's reaction to this. However, patient now has more realistic expectations regarding daughter's responses and interactions. She expresses increased acceptance of being unable to change her daughter's situation. She reports improved ability to set and maintain boundaries. She has improved self -care regarding rest and relaxation. She also has been journaling and using mindfulness activities. She reports this has been helpful. Patient continues to worry about weight gain and feels negative about self.  Therapist Response: Therapist works with patient to review symptoms, continue to  facilitate expression of grief regarding lost dreams related to expectations patient had of daughter, identify ways to reframe thought patterns regarding weight management, identify realistic goals and coping statements regarding weight management  Plan: Return again in 2 week.   Diagnosis: Axis I: Major Depressive Disorder, Recurrent, Moderate    Axis II: No diagnosis    Edwardine Deschepper, LCSW 04/24/2015

## 2015-05-02 ENCOUNTER — Ambulatory Visit: Payer: Self-pay | Admitting: Family Medicine

## 2015-05-09 ENCOUNTER — Encounter (HOSPITAL_COMMUNITY): Payer: Self-pay | Admitting: Psychiatry

## 2015-05-09 ENCOUNTER — Telehealth: Payer: Self-pay | Admitting: Family Medicine

## 2015-05-09 ENCOUNTER — Ambulatory Visit: Payer: Self-pay | Admitting: Family Medicine

## 2015-05-09 ENCOUNTER — Ambulatory Visit (INDEPENDENT_AMBULATORY_CARE_PROVIDER_SITE_OTHER): Payer: BLUE CROSS/BLUE SHIELD | Admitting: Psychiatry

## 2015-05-09 DIAGNOSIS — F331 Major depressive disorder, recurrent, moderate: Secondary | ICD-10-CM | POA: Diagnosis not present

## 2015-05-09 NOTE — Progress Notes (Signed)
       THERAPIST PROGRESS NOTE  Session Time:    Thursday 05/09/2015 9:10 AM - 10:10 AM                                Participation Level: Active  Behavioral Response: CasualAlertAnxious and Depressed  Type of Therapy: Individual Therapy  Treatment Goals:  1. Learn and implement coping and behavioral strategies to overcome depression.     2. Verbalize an understanding and resolution of current interpersonal problems.     3. Learn and implement conflict resolution skills to resolve interpersonal problems.   Treatment Goals addressed: 1,2  Interventions: Supportive, CBT  Summary: Pamela Walters is a 55 y.o. female who is referred for services by PCP Dr. Moshe Cipro due to patient suffering symptoms of depression. Patient reports several family issues.  She reports becoming very stressed and depressed last year when her 29 year old granddaughter attempted suicide. She continues to worry about granddaughter who resides with patient's daughter who did not reveal to patient that granddaughter was having any problems until the suicide attempt occurred.  Patient reports strained relationship with daughter as she and her husband have raised her daughter's oldest two children. Her daughter's third child was raised by another relative. Daughter has made poor choices and continues to to have difficulty making responsible choices which leads to constant tension in the relationship between patient and daughter per patient's report.  Patient's daughter lost her job last year due to legal issues and has been unable to find a job. Patient reports stress related to daughter now constantly asking for money. 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   Patient reports things have been going well since last session. She has improved self-care efforts regarding weight management and reports increased physical activity and better eating patterns. She is  pleased with a 4 pound weight loss. However, she continues to expeience  low energy and poor motivation. She attributes some of this to her diagnosis of MS. Patient continues to have unrealistic expectations of self and admits being judgmental and critical of self. She admits poor self-acceptance   Therapist Response: Therapist works with patient to review symptoms, praise and reinforce improved self-care efforts, discuss connection between thoughts/mood/behavior, began to identify common thinking errors, intermediate beliefs, and core beliefs, discuss the process of change using "Allegory of Change"  Plan: Return again in 2 weeks. Patient agrees to start completing thoughts/mood/behavior log and bring to next sessiom  Diagnosis: Axis I: Major Depressive Disorder, Recurrent, Moderate    Axis II: No diagnosis    Amaranta Mehl, LCSW 05/09/2015

## 2015-05-09 NOTE — Telephone Encounter (Signed)
Patient is asking to speak to the nurse she needs a written Rx for her triamterene-hydrochlorothiazide (MAXZIDE-25) 37.5-25 MG per tablet because she has changed pharmacy's please advise?

## 2015-05-09 NOTE — Patient Instructions (Signed)
Discussed orally 

## 2015-05-10 LAB — LIPID PANEL
CHOL/HDL RATIO: 3.8 ratio (ref <=5.0)
CHOLESTEROL: 146 mg/dL (ref 125–200)
HDL: 38 mg/dL — AB (ref >=46)
LDL Cholesterol: 79 mg/dL (ref <130)
TRIGLYCERIDES: 147 mg/dL (ref <150)
VLDL: 29 mg/dL (ref <30)

## 2015-05-10 LAB — COMPLETE METABOLIC PANEL WITH GFR
ALBUMIN: 4.3 g/dL (ref 3.6–5.1)
ALK PHOS: 79 U/L (ref 33–130)
ALT: 16 U/L (ref 6–29)
AST: 17 U/L (ref 10–35)
BUN: 23 mg/dL (ref 7–25)
CALCIUM: 9.5 mg/dL (ref 8.6–10.4)
CO2: 25 mmol/L (ref 20–31)
Chloride: 103 mmol/L (ref 98–110)
Creat: 0.85 mg/dL (ref 0.50–1.05)
GFR, EST AFRICAN AMERICAN: 89 mL/min (ref >=60)
GFR, EST NON AFRICAN AMERICAN: 77 mL/min (ref >=60)
Glucose, Bld: 84 mg/dL (ref 65–99)
Potassium: 4.1 mmol/L (ref 3.5–5.3)
Sodium: 137 mmol/L (ref 135–146)
TOTAL PROTEIN: 7.2 g/dL (ref 6.1–8.1)
Total Bilirubin: 0.5 mg/dL (ref 0.2–1.2)

## 2015-05-10 LAB — HEMOGLOBIN A1C
Hgb A1c MFr Bld: 5.9 % — ABNORMAL HIGH (ref <5.7)
Mean Plasma Glucose: 123 mg/dL — ABNORMAL HIGH (ref <117)

## 2015-05-10 NOTE — Telephone Encounter (Signed)
Called patient and left message for them to return call at the office   

## 2015-05-13 ENCOUNTER — Other Ambulatory Visit: Payer: Self-pay

## 2015-05-13 DIAGNOSIS — I10 Essential (primary) hypertension: Secondary | ICD-10-CM

## 2015-05-13 MED ORDER — TRIAMTERENE-HCTZ 37.5-25 MG PO TABS: 1.0000 | ORAL_TABLET | Freq: Every day | ORAL | Status: DC

## 2015-05-13 NOTE — Telephone Encounter (Signed)
Called patient and left message for them to return call at the office   

## 2015-05-13 NOTE — Telephone Encounter (Signed)
Medication sent to [facility].  [facility] cancelled from chart.

## 2015-05-23 ENCOUNTER — Ambulatory Visit (INDEPENDENT_AMBULATORY_CARE_PROVIDER_SITE_OTHER): Payer: BLUE CROSS/BLUE SHIELD | Admitting: Psychiatry

## 2015-05-23 DIAGNOSIS — F331 Major depressive disorder, recurrent, moderate: Secondary | ICD-10-CM

## 2015-05-23 NOTE — Progress Notes (Signed)
        THERAPIST PROGRESS NOTE  Session Time:    Thursday 05/23/2015 10:15 AM - 11:10 AM                                  Participation Level: Active  Behavioral Response: CasualAlertAnxious and Depressed  Type of Therapy: Individual Therapy  Treatment Goals:  1. Learn and implement coping and behavioral strategies to overcome depression.     2. Verbalize an understanding and resolution of current interpersonal problems.     3. Learn and implement conflict resolution skills to resolve interpersonal problems.   Treatment Goals addressed: 1,2  Interventions: Supportive, CBT  Summary: Pamela Walters is a 55 y.o. female who is referred for services by PCP Dr. Moshe Cipro due to patient suffering symptoms of depression. Patient reports several family issues.  She reports becoming very stressed and depressed last year when her 67 year old granddaughter attempted suicide. She continues to worry about granddaughter who resides with patient's daughter who did not reveal to patient that granddaughter was having any problems until the suicide attempt occurred.  Patient reports strained relationship with daughter as she and her husband have raised her daughter's oldest two children. Her daughter's third child was raised by another relative. Daughter has made poor choices and continues to to have difficulty making responsible choices which leads to constant tension in the relationship between patient and daughter per patient's report.  Patient's daughter lost her job last year due to legal issues and has been unable to find a job. Patient reports stress related to daughter now constantly asking for money. 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   Patient reports things have been going well since last session. She has maintained self-care efforts regarding weight management and reports increased physical activity (exercising 2-3 x per week) and  better eating patterns. She also is experiencing increased energy and motivation. She is considering going back to school to obtain BSN and has an appointment with a college advisor tomorrow to learn more information about program. She reports managing stress at work better as she has been using relaxation techniques and using coping statements resulting in patient being less anxious at work. However, she continues to anticipate the worst at times and keeps waiting for something negative to happen. She did began to use mood/thought log but stopped after a few days.   Therapist Response: Therapist works with patient to review symptoms, praise and reinforce improved self-care efforts and increased initiative, discuss connection between thoughts/mood/behavior, review rationale for keeping mood/thought log   Plan: Return again in 2 weeks. Patient agrees to use thoughts/mood/behavior log and bring to next sessiom  Diagnosis: Axis I: Major Depressive Disorder, Recurrent, Moderate    Axis II: No diagnosis    Xzayvier Fagin, LCSW 05/23/2015

## 2015-05-23 NOTE — Patient Instructions (Signed)
Discussed orally 

## 2015-06-06 ENCOUNTER — Ambulatory Visit (HOSPITAL_COMMUNITY): Payer: Self-pay | Admitting: Psychiatry

## 2015-06-10 ENCOUNTER — Encounter: Payer: Self-pay | Admitting: Family Medicine

## 2015-06-20 ENCOUNTER — Ambulatory Visit (INDEPENDENT_AMBULATORY_CARE_PROVIDER_SITE_OTHER): Payer: BLUE CROSS/BLUE SHIELD | Admitting: Family Medicine

## 2015-06-20 ENCOUNTER — Encounter: Payer: Self-pay | Admitting: Family Medicine

## 2015-06-20 ENCOUNTER — Ambulatory Visit (INDEPENDENT_AMBULATORY_CARE_PROVIDER_SITE_OTHER): Payer: BLUE CROSS/BLUE SHIELD | Admitting: Psychiatry

## 2015-06-20 ENCOUNTER — Encounter (HOSPITAL_COMMUNITY): Payer: Self-pay | Admitting: Psychiatry

## 2015-06-20 VITALS — BP 130/80 | HR 76 | Temp 99.0°F | Resp 16 | Ht 63.0 in | Wt 201.8 lb

## 2015-06-20 DIAGNOSIS — B002 Herpesviral gingivostomatitis and pharyngotonsillitis: Secondary | ICD-10-CM

## 2015-06-20 DIAGNOSIS — Z1159 Encounter for screening for other viral diseases: Secondary | ICD-10-CM

## 2015-06-20 DIAGNOSIS — J309 Allergic rhinitis, unspecified: Secondary | ICD-10-CM | POA: Insufficient documentation

## 2015-06-20 DIAGNOSIS — N3000 Acute cystitis without hematuria: Secondary | ICD-10-CM | POA: Diagnosis not present

## 2015-06-20 DIAGNOSIS — J3089 Other allergic rhinitis: Secondary | ICD-10-CM | POA: Diagnosis not present

## 2015-06-20 DIAGNOSIS — G35 Multiple sclerosis: Secondary | ICD-10-CM

## 2015-06-20 DIAGNOSIS — E669 Obesity, unspecified: Secondary | ICD-10-CM

## 2015-06-20 DIAGNOSIS — F331 Major depressive disorder, recurrent, moderate: Secondary | ICD-10-CM | POA: Diagnosis not present

## 2015-06-20 DIAGNOSIS — I10 Essential (primary) hypertension: Secondary | ICD-10-CM | POA: Diagnosis not present

## 2015-06-20 DIAGNOSIS — F418 Other specified anxiety disorders: Secondary | ICD-10-CM

## 2015-06-20 DIAGNOSIS — E785 Hyperlipidemia, unspecified: Secondary | ICD-10-CM

## 2015-06-20 LAB — POCT URINALYSIS DIPSTICK
BILIRUBIN UA: NEGATIVE
GLUCOSE UA: NEGATIVE
KETONES UA: NEGATIVE
Leukocytes, UA: NEGATIVE
Nitrite, UA: NEGATIVE
Protein, UA: NEGATIVE
RBC UA: NEGATIVE
Spec Grav, UA: 1.02
Urobilinogen, UA: 0.2
pH, UA: 7

## 2015-06-20 MED ORDER — MOMETASONE FUROATE 50 MCG/ACT NA SUSP: 2.0000 | Freq: Every day | NASAL | Status: DC

## 2015-06-20 MED ORDER — ACYCLOVIR 400 MG PO TABS: 400.0000 mg | ORAL_TABLET | Freq: Three times a day (TID) | ORAL | Status: DC

## 2015-06-20 MED ORDER — PREDNISONE 5 MG PO TABS: 5.0000 mg | ORAL_TABLET | Freq: Two times a day (BID) | ORAL | Status: AC

## 2015-06-20 NOTE — Progress Notes (Signed)
Subjective:    Patient ID: Pamela Walters, female    DOB: 08-Jan-1961, 55 y.o.   MRN: [id number]  HPI    Pamela Walters     MRN: [id number]      DOB: August 17, 1960   HPI Pamela Walters is here for follow up and re-evaluation of chronic medical conditions, medication management and review of any available recent lab and radiology data.  Preventive health is updated, specifically  Cancer screening and Immunization.    concerns regarding consultations or procedures which the PT has had in the interim are  addressed. The PT denies any adverse reactions to current medications since the last visit.  2 weeks ago had flare of  HSV1 was treated with acyclovir success, will send in 1 week supply in the event of a recurrent flare 1 week h/o allergy symptoms, no fever, excess clear drainage , water eyes   ROS Denies recent fever or chills. Denies  ear pain or sore throat. Denies chest congestion, productive cough or wheezing. Denies chest pains, palpitations and leg swelling Denies abdominal pain, nausea, vomiting,diarrhea or constipation.   Denies joint pain, swelling and limitation in mobility. Denies headaches, seizures, numbness, or tingling. Denies  Uncontrolled depression, anxiety or insomnia. Denies skin break down or rash.   PE  BP 130/80 mmHg  Pulse 76  Temp(Src) 99 F (37.2 C) (Oral)  Resp 16  Ht 5\' 3"  (1.6 m)  Wt 201 lb 12.8 oz (91.536 kg)  BMI 35.76 kg/m2  SpO2 97%  Patient alert and oriented and in no cardiopulmonary distress.  HEENT: No facial asymmetry, EOMI,   oropharynx pink and moist.  Neck supple no JVD, no mass.  Chest: Clear to auscultation bilaterally.  CVS: S1, S2 no murmurs, no S3.Regular rate.  ABD: Soft non tender.   Ext: No edema  MS: Adequate ROM spine, shoulders, hips and knees.  Skin: Intact, no ulcerations or rash noted.  Psych: Good eye contact, normal affect. Memory intact not anxious or depressed appearing.  CNS: CN 2-12 intact, power,   normal throughout.no focal deficits noted.   Assessment & Plan   Essential hypertension Controlled, no change in medication DASH diet and commitment to daily physical activity for a minimum of 30 minutes discussed and encouraged, as a part of hypertension management. The importance of attaining a healthy weight is also discussed.  BP/Weight 06/20/2015 03/25/2015 03/04/2015 11/28/2014 10/01/2014 08/16/2014 [id number]  Systolic BP [id number] - [id number] 93 [id number]  Diastolic BP 80 - 88 82 92 54 68  Wt. (Lbs) 201.8 185 201.4 200 205.8 - 191.4  BMI 35.76 32.78 35.69 35.44 36.46 - 33.91  Some encounter information is confidential and restricted. Go to Review Flowsheets activity to see all data.        Allergic rhinitis Increased symptoms, currently uncontrolled , needs to commit to daily medication 5 day course of prednisone also prescribed  Depression with anxiety Marked improvement with therapy as well as management by psychiatry. Concerned about med associated weight gain , will worl on dietary change and increased exercise. Counseled against stopping meds, has no intent to do so, will discuss furhter with psych  Obesity Deteriorated. Patient re-educated about  the importance of commitment to a  minimum of 150 minutes of exercise per week.  The importance of healthy food choices with portion control discussed. Encouraged to start a food diary, count calories and to consider  joining a support group. Sample diet sheets offered. Goals set by the patient for  the next several months.   Weight /BMI 06/20/2015 03/25/2015 03/04/2015  WEIGHT 201 lb 12.8 oz 185 lb 201 lb 6.4 oz  HEIGHT 5\' 3"  - 5\' 3"   BMI 35.76 kg/m2 32.78 kg/m2 35.69 kg/m2  Some encounter information is confidential and restricted. Go to Review Flowsheets activity to see all data.    Current exercise per week 60 minutes.   Hyperlipidemia LDL goal <100 Improved Hyperlipidemia:Low fat diet discussed and encouraged.   Lipid  Panel  Lab Results  Component Value Date   CHOL 146 05/09/2015   HDL 38* 05/09/2015   LDLCALC 79 05/09/2015   TRIG 147 05/09/2015   CHOLHDL 3.8 05/09/2015   Increased exercise encouraged, low HDL     Acute cystitis without hematuria Symptomatic, however normal UA in office  Multiple sclerosis ([place]) Stable and followed by neurology  Oral herpes Recent flare, now recovered, 1 week supply of medication sent in the event of recurrence       Review of Systems     Objective:   Physical Exam        Assessment & Plan:

## 2015-06-20 NOTE — Progress Notes (Signed)
        THERAPIST PROGRESS NOTE  Session Time:    Thursday  06/20/2015 10:06 AM  - 10:54 AM                                   Participation Level: Active  Behavioral Response: CasualAlertAnxious and Depressed  Type of Therapy: Individual Therapy  Treatment Goals:  1. Learn and implement coping and behavioral strategies to overcome depression.     2. Verbalize an understanding and resolution of current interpersonal problems.     3. Learn and implement conflict resolution skills to resolve interpersonal problems.   Treatment Goals addressed: ,2,3  Interventions: Supportive, CBT  Summary: Pamela Walters is a 55 y.o. female who is referred for services by PCP Dr. Moshe Cipro due to patient suffering symptoms of depression. Patient reports several family issues.  She reports becoming very stressed and depressed last year when her 38 year old granddaughter attempted suicide. She continues to worry about granddaughter who resides with patient's daughter who did not reveal to patient that granddaughter was having any problems until the suicide attempt occurred.  Patient reports strained relationship with daughter as she and her husband have raised her daughter's oldest two children. Her daughter's third child was raised by another relative. Daughter has made poor choices and continues to to have difficulty making responsible choices which leads to constant tension in the relationship between patient and daughter per patient's report.  Patient's daughter lost her job last year due to legal issues and has been unable to find a job. Patient reports stress related to daughter now constantly asking for money. 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   Patient reports increased stress since last session. There have been staff shortages at her job and she has had a couple of confrontations. She is very critical of self for not using coping skills in  the manner she thought she should have used. She is pleased that she attended meeting regarding nursing program and now is enrolled in chemistry class at [employer] to meet the prerequisite. She admits being nervous and self-conscious initially about being an older student but expresses determination to complete the course.   Therapist Response: Therapist works with patient to review symptoms, praise and reinforce initiative in pursuing educational goals, discuss being assertive versus being aggressive, identify blockers and helpers of effective assertion, identify and challenge cognitive distortions regarding should statements and magnification of the negative  Plan: Return again in 2 weeks. Patient agrees to use thoughts/mood/behavior log and bring to next sessiom  Diagnosis: Axis I: Major Depressive Disorder, Recurrent, Moderate    Axis II: No diagnosis    BYNUM,PEGGY, LCSW 06/20/2015

## 2015-06-20 NOTE — Patient Instructions (Signed)
Discussed orally 

## 2015-06-20 NOTE — Patient Instructions (Signed)
F/u 2nd week August , cqll if you need me sooner   Urine checked for infection due to 3 week history  For allergies , continue claritin daily, prednisone sent for 5 days, new is nasonex daily, sent  to your mail order   Prednisone and acyclovir sent to local pharmacy  Cholesterol good, Blood sugar still higher than it should be  Fasting labs August 3 or after  Thanks for choosing [facility], we consider it a privelige to serve you.

## 2015-06-21 ENCOUNTER — Telehealth: Payer: Self-pay | Admitting: Family Medicine

## 2015-06-21 ENCOUNTER — Other Ambulatory Visit: Payer: Self-pay

## 2015-06-21 DIAGNOSIS — Z1211 Encounter for screening for malignant neoplasm of colon: Secondary | ICD-10-CM

## 2015-06-21 DIAGNOSIS — N3000 Acute cystitis without hematuria: Secondary | ICD-10-CM | POA: Insufficient documentation

## 2015-06-21 DIAGNOSIS — B002 Herpesviral gingivostomatitis and pharyngotonsillitis: Secondary | ICD-10-CM | POA: Insufficient documentation

## 2015-06-21 NOTE — Assessment & Plan Note (Signed)
Marked improvement with therapy as well as management by psychiatry. Concerned about med associated weight gain , will worl on dietary change and increased exercise. Counseled against stopping meds, has no intent to do so, will discuss furhter with psych

## 2015-06-21 NOTE — Assessment & Plan Note (Signed)
Stable and followed by neurology

## 2015-06-21 NOTE — Assessment & Plan Note (Signed)
Symptomatic, however normal UA in office

## 2015-06-21 NOTE — Assessment & Plan Note (Signed)
Improved Hyperlipidemia:Low fat diet discussed and encouraged.   Lipid Panel  Lab Results  Component Value Date   CHOL 146 05/09/2015   HDL 38* 05/09/2015   LDLCALC 79 05/09/2015   TRIG 147 05/09/2015   CHOLHDL 3.8 05/09/2015   Increased exercise encouraged, low HDL

## 2015-06-21 NOTE — Assessment & Plan Note (Signed)
Controlled, no change in medication DASH diet and commitment to daily physical activity for a minimum of 30 minutes discussed and encouraged, as a part of hypertension management. The importance of attaining a healthy weight is also discussed.  BP/Weight 06/20/2015 03/25/2015 03/04/2015 11/28/2014 10/01/2014 08/16/2014 [id number]  Systolic BP [id number] - [id number] 0000000 0000000 93 [id number]  Diastolic BP 80 - 88 82 92 54 68  Wt. (Lbs) 201.8 185 201.4 200 205.8 - 191.4  BMI 35.76 32.78 35.69 35.44 36.46 - 33.91  Some encounter information is confidential and restricted. Go to Review Flowsheets activity to see all data.

## 2015-06-21 NOTE — Assessment & Plan Note (Signed)
Recent flare, now recovered, 1 week supply of medication sent in the event of recurrence

## 2015-06-21 NOTE — Assessment & Plan Note (Signed)
Deteriorated. Patient re-educated about  the importance of commitment to a  minimum of 150 minutes of exercise per week.  The importance of healthy food choices with portion control discussed. Encouraged to start a food diary, count calories and to consider  joining a support group. Sample diet sheets offered. Goals set by the patient for the next several months.   Weight /BMI 06/20/2015 03/25/2015 03/04/2015  WEIGHT 201 lb 12.8 oz 185 lb 201 lb 6.4 oz  HEIGHT 5\' 3"  - 5\' 3"   BMI 35.76 kg/m2 32.78 kg/m2 35.69 kg/m2  Some encounter information is confidential and restricted. Go to Review Flowsheets activity to see all data.    Current exercise per week 60 minutes.

## 2015-06-21 NOTE — Assessment & Plan Note (Signed)
Increased symptoms, currently uncontrolled , needs to commit to daily medication 5 day course of prednisone also prescribed

## 2015-06-21 NOTE — Telephone Encounter (Signed)
Pls contact pt, telephone if possible, I tried , no success, or letter, DOES NEED colonoscopy,  refer to her GI Doc (Pyrtle) if she agrees, he has discussed this with her also last year, hse has never had one!

## 2015-06-21 NOTE — Telephone Encounter (Signed)
Message left for patient and referred to [facility]

## 2015-07-01 DIAGNOSIS — G4733 Obstructive sleep apnea (adult) (pediatric): Secondary | ICD-10-CM | POA: Diagnosis not present

## 2015-07-09 ENCOUNTER — Ambulatory Visit (HOSPITAL_COMMUNITY): Payer: Self-pay | Admitting: Psychiatry

## 2015-07-11 ENCOUNTER — Ambulatory Visit (HOSPITAL_COMMUNITY): Payer: Self-pay | Admitting: Psychiatry

## 2015-07-25 ENCOUNTER — Encounter (HOSPITAL_COMMUNITY): Payer: Self-pay | Admitting: Psychiatry

## 2015-07-25 ENCOUNTER — Ambulatory Visit (INDEPENDENT_AMBULATORY_CARE_PROVIDER_SITE_OTHER): Payer: BLUE CROSS/BLUE SHIELD | Admitting: Psychiatry

## 2015-07-25 DIAGNOSIS — F331 Major depressive disorder, recurrent, moderate: Secondary | ICD-10-CM | POA: Diagnosis not present

## 2015-07-25 NOTE — Progress Notes (Signed)
         THERAPIST PROGRESS NOTE  Session Time:    Thursday 07/25/2015 10:08 AM -  10:59 AM                                   Participation Level: Active  Behavioral Response: CasualAlertAnxious and Depressed  Type of Therapy: Individual Therapy  Treatment Goals:  1. Learn and implement coping and behavioral strategies to overcome depression.     2. Verbalize an understanding and resolution of current interpersonal problems.     3. Learn and implement conflict resolution skills to resolve interpersonal problems.   Treatment Goals addressed: 1  Interventions: Supportive, ACT, CBT  Summary: Pamela Walters is a 55 y.o. female who is referred for services by PCP Dr. Moshe Cipro due to patient suffering symptoms of depression. Patient reports several family issues.  She reports becoming very stressed and depressed last year when her 38 year old granddaughter attempted suicide. She continues to worry about granddaughter who resides with patient's daughter who did not reveal to patient that granddaughter was having any problems until the suicide attempt occurred.  Patient reports strained relationship with daughter as she and her husband have raised her daughter's oldest two children. Her daughter's third child was raised by another relative. Daughter has made poor choices and continues to to have difficulty making responsible choices which leads to constant tension in the relationship between patient and daughter per patient's report.  Patient's daughter lost her job last year due to legal issues and has been unable to find a job. Patient reports stress related to daughter now constantly asking for money. 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   Patient reports continued stress since last session. 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 But, she now is working more hours overall between the 2 jobs. She also continues to attend class. She says class is very difficult but is pleased she is passing. She continues to experience anxiety and states just being wound up at times. She still tends to have negative thought patterns and anticipates the worst. She is using relaxation techniques but not consistently. She reports being down and critical of self regarding medical condition related to her bladder.   Therapist Response: Therapist worked with patient to review symptoms, discuss rationale for consistent use of relaxation technique using controlled breathing, introduce and discuss instructions for completing anxiety log, discuss patient's thoughts and feelings about medical condition, discuss clean suffering versus dirty suffering regarding thought process about medical condition and effects on patient's mood and behavior,   Plan: Return again in 2 weeks. Patient agrees to practice relaxation technique daily, complete breathing log, and bring to next sessiom  Diagnosis: Axis I: Major Depressive Disorder, Recurrent, Moderate    Axis II: No diagnosis    Charelle Petrakis, LCSW 07/25/2015

## 2015-07-25 NOTE — Patient Instructions (Signed)
Discussed orally 

## 2015-08-01 DIAGNOSIS — G4733 Obstructive sleep apnea (adult) (pediatric): Secondary | ICD-10-CM | POA: Diagnosis not present

## 2015-08-08 ENCOUNTER — Ambulatory Visit (INDEPENDENT_AMBULATORY_CARE_PROVIDER_SITE_OTHER): Payer: BLUE CROSS/BLUE SHIELD | Admitting: Psychiatry

## 2015-08-08 ENCOUNTER — Encounter (HOSPITAL_COMMUNITY): Payer: Self-pay | Admitting: Psychiatry

## 2015-08-08 DIAGNOSIS — F331 Major depressive disorder, recurrent, moderate: Secondary | ICD-10-CM

## 2015-08-08 NOTE — Patient Instructions (Signed)
Discussed orally 

## 2015-08-08 NOTE — Progress Notes (Signed)
         THERAPIST PROGRESS NOTE  Session Time:    Thursday 08/08/2015 10:10 AM -11:02 AM        Participation Level: Active  Behavioral Response: CasualAlertAnxious and Depressed  Type of Therapy: Individual Therapy  Treatment Goals:  1. Learn and implement coping and behavioral strategies to overcome depression.     2. Verbalize an understanding and resolution of current interpersonal problems.     3. Learn and implement conflict resolution skills to resolve interpersonal problems.   Treatment Goals addressed: 2,3  Interventions: Supportive, ACT, CBT  Summary: Pamela Walters is a 55 y.o. female who is referred for services by PCP Dr. Moshe Cipro due to patient suffering symptoms of depression. Patient reports several family issues.  She reports becoming very stressed and depressed last year when her 69 year old granddaughter attempted suicide. She continues to worry about granddaughter who resides with patient's daughter who did not reveal to patient that granddaughter was having any problems until the suicide attempt occurred.  Patient reports strained relationship with daughter as she and her husband have raised her daughter's oldest two children. Her daughter's third child was raised by another relative. Daughter has made poor choices and continues to to have difficulty making responsible choices which leads to constant tension in the relationship between patient and daughter per patient's report.  Patient's daughter lost her job last year due to legal issues and has been unable to find a job. Patient reports stress related to daughter now constantly asking for money. 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   Patient reports decreased  stress and anxiety since last session. She reports recent incident with her daughter making a request for patient and her husband to take care of patient's 78 year old granddaughter. Patient is  pleased with the way she used assertiveness skills and has set and maintain boundaries with her daughter regarding this. She also reports having a recent family event where patient could have had a negative response to her daughter's behavior. However, patient reports being more mindful of her own thoughts and actions and choosing to respond in a healthy manner. She reports a recent incident at work in which she used assertiveness skills regarding her response to being asked to working extra shift.   Therapist Response:   Reviewed symptoms, facilitated expression of feelings, praised and reinforced patient's increased awareness of thoughts and feelings, praised and reinforced patient's use of assertiveness skills, discussed the effects of use of assertiveness skills on patient and her interaction with others, discussed how to use assertiveness skills in various situations, discussed and provided patient a handout regarding basic personal rights to identify thoughts that promote effective assertion  Plan: Return again in 2 weeks. Patient agrees to practice assertiveness skills and read basic personal rights daily  Diagnosis: Axis I: Major Depressive Disorder, Recurrent, Moderate    Axis II: No diagnosis    Zhamir Pirro, LCSW 08/08/2015

## 2015-08-19 ENCOUNTER — Other Ambulatory Visit: Payer: Self-pay | Admitting: Neurology

## 2015-08-22 ENCOUNTER — Ambulatory Visit (HOSPITAL_COMMUNITY): Payer: Self-pay | Admitting: Psychiatry

## 2015-08-29 ENCOUNTER — Ambulatory Visit (INDEPENDENT_AMBULATORY_CARE_PROVIDER_SITE_OTHER): Payer: BLUE CROSS/BLUE SHIELD | Admitting: Psychiatry

## 2015-08-29 ENCOUNTER — Encounter (HOSPITAL_COMMUNITY): Payer: Self-pay | Admitting: Psychiatry

## 2015-08-29 DIAGNOSIS — F331 Major depressive disorder, recurrent, moderate: Secondary | ICD-10-CM

## 2015-08-29 NOTE — Progress Notes (Addendum)
          THERAPIST PROGRESS NOTE  Session Time:    Thursday 08/29/2015 11:01 AM 11:52 AM         Participation Level: Active  Behavioral Response: CasualAlertAnxious and Depressed  Type of Therapy: Individual Therapy        Treatment Goals:  1. Learn and implement coping and behavioral strategies to overcome depression.     2. Verbalize an understanding and resolution of current interpersonal problems.     3. Learn and implement conflict resolution skills to resolve interpersonal problems.   Treatment Goals addressed: 2,3  Interventions: Supportive, ACT, CBT  Summary: Pamela Walters is a 55 y.o. female who is referred for services by PCP Dr. Moshe Cipro due to patient suffering symptoms of depression. Patient reports several family issues.  She reports becoming very stressed and depressed last year when her 66 year old granddaughter attempted suicide. She continues to worry about granddaughter who resides with patient's daughter who did not reveal to patient that granddaughter was having any problems until the suicide attempt occurred.  Patient reports strained relationship with daughter as she and her husband have raised her daughter's oldest two children. Her daughter's third child was raised by another relative. Daughter has made poor choices and continues to to have difficulty making responsible choices which leads to constant tension in the relationship between patient and daughter per patient's report.  Patient's daughter lost her job last year due to legal issues and has been unable to find a job. Patient reports stress related to daughter now constantly asking for money. 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   Patient last was seen about 3 weeks ago. She reports enjoying a recent trip to the beach with friends and coworkers. However, when she returned home this past Sunday, she reports becoming very upset as her daughter  was in patient's home without her permission. She asked patient for money and has continued to ask patient and her husband for money throughout this week. Patient has been able to set and maintain boundaries with daughter regarding money. She also has been able to refrain from becoming involved with husband's decision on how he sets or does not set boundaries with their daughter. This has been less stressful forpatient's per her report. She continues to express frustration and anxiety regarding daughter's choices and behaviors. Patient reports increased negative thinking and ruminating thoughts.   Therapist Response:   Reviewed symptoms, facilitated expression of feelings, praised and reinforced patient's increased awareness of thoughts and feelings, praised and reinforced patient's use of assertiveness skills, discussed the connection between thoughts/mood/and behavior, assisted patient to begin to identify her thought patterns regarding recent incidents with daughter and effects on patient's mood and behavior, discussed rationale for journaling thoughts and feelings,    Plan: Return again in 2 weeks. Patient agrees to begin journaling thoughts and feelings and also develop a gratitude journal  Diagnosis: Axis I: Major Depressive Disorder, Recurrent, Moderate    Axis II: No diagnosis    Laurrie Toppin, LCSW 08/29/2015

## 2015-08-29 NOTE — Patient Instructions (Signed)
Discussed orally 

## 2015-08-31 DIAGNOSIS — G4733 Obstructive sleep apnea (adult) (pediatric): Secondary | ICD-10-CM | POA: Diagnosis not present

## 2015-09-02 ENCOUNTER — Ambulatory Visit: Payer: Self-pay | Admitting: Neurology

## 2015-09-03 ENCOUNTER — Encounter: Payer: Self-pay | Admitting: Neurology

## 2015-09-03 ENCOUNTER — Ambulatory Visit (INDEPENDENT_AMBULATORY_CARE_PROVIDER_SITE_OTHER): Payer: BLUE CROSS/BLUE SHIELD | Admitting: Neurology

## 2015-09-03 VITALS — BP 140/88 | HR 74 | Resp 18 | Ht 63.0 in | Wt 206.0 lb

## 2015-09-03 DIAGNOSIS — R5383 Other fatigue: Secondary | ICD-10-CM

## 2015-09-03 DIAGNOSIS — E7219 Other disorders of sulfur-bearing amino-acid metabolism: Secondary | ICD-10-CM

## 2015-09-03 DIAGNOSIS — M791 Myalgia: Secondary | ICD-10-CM | POA: Diagnosis not present

## 2015-09-03 DIAGNOSIS — F418 Other specified anxiety disorders: Secondary | ICD-10-CM

## 2015-09-03 DIAGNOSIS — M609 Myositis, unspecified: Secondary | ICD-10-CM

## 2015-09-03 DIAGNOSIS — R350 Frequency of micturition: Secondary | ICD-10-CM | POA: Diagnosis not present

## 2015-09-03 DIAGNOSIS — G35 Multiple sclerosis: Secondary | ICD-10-CM | POA: Diagnosis not present

## 2015-09-03 DIAGNOSIS — R7983 Abnormal findings of blood amino-acid level: Secondary | ICD-10-CM

## 2015-09-03 DIAGNOSIS — E7211 Homocystinuria: Secondary | ICD-10-CM | POA: Diagnosis not present

## 2015-09-03 DIAGNOSIS — R208 Other disturbances of skin sensation: Secondary | ICD-10-CM | POA: Diagnosis not present

## 2015-09-03 DIAGNOSIS — IMO0001 Reserved for inherently not codable concepts without codable children: Secondary | ICD-10-CM

## 2015-09-03 DIAGNOSIS — G4733 Obstructive sleep apnea (adult) (pediatric): Secondary | ICD-10-CM

## 2015-09-03 MED ORDER — FA-PYRIDOXINE-CYANOCOBALAMIN 2.5-25-2 MG PO TABS: 1.0000 | ORAL_TABLET | Freq: Every day | ORAL | Status: DC

## 2015-09-03 MED ORDER — GABAPENTIN 600 MG PO TABS: 600.0000 mg | ORAL_TABLET | Freq: Three times a day (TID) | ORAL | Status: DC

## 2015-09-03 MED ORDER — OXYBUTYNIN CHLORIDE ER 10 MG PO TB24: 10.0000 mg | ORAL_TABLET | Freq: Every day | ORAL | Status: DC

## 2015-09-03 NOTE — Progress Notes (Signed)
[facility]  PATIENT: Pamela Walters DOB: 06/24/1960  REFERRING CLINICIAN: [name] HISTORY FROM: Patient REASON FOR VISIT: Possible MS, fatigue, myalgias   HISTORICAL  CHIEF COMPLAINT:  Chief Complaint  Patient presents with  . Multiple Sclerosis    She continues off of MS med.  Last MRI brain in Dec. 2016 showed no new changes.  Sts. she is compliant with CPAP.  Denies new or worsening sx/fim  . Sleep Apnea    HISTORY OF PRESENT ILLNESS:  She is a 55 year old woman who has probable MS, myalgias who was been diagnosed with obstructive sleep apnea and has been placed on CPAP therapy.    MS:   She has been off any disease modifying therapy for her multiple sclerosis x 5 years.    Her last MRI 03/29/2015 was unchanged compared to the MRI 02/09/2013.   She does not believe she has had any exacerbations.  Gait/strength/sensation:   Gait is fairly good though she notes balance is slightly off.   Strength is good and she denies any significant spasticity.  She notes no numbness but has some painful tingling in the legs at times    Bladder:  She has bladder frequency and urgency helped by oxybutynin.   She tolerates it well.     Vision:   She feels vision is stable.    She deneis a h/o optic neuritis or diplopia.    Her last eye exanm was fine.   She wears glasses.     OSA/sleep:  She has severe OSA with AHI = 37. She was titrated to CPAP 9 cm.    She tries to wear CPAP every night    When she wears CPAP the whole night, she does feel less tired the next day.   She has also noted less nocturia when she uses CPAP.   She has irregular sleep due to shift work and sometimes has insomnia.        Fatigue/mood:   She notes that she fatigues as her day goes on.  The fatigue is more physical and mental.   Fatigue is always worse when she is hot.    Fatigue has been a little better since starting CPAP.    She has had mood swings and some irritability but no crying  spells.  She sees Dr. Sima Matas.   She is gaining weight on Abilify but current regimen is helping mood a lot.  Pain:   Myalgias are present in her legs and is moderate.   She takes Skelaxin and hydrocodone with some benefit.    She takes gabapentin for nerve/musclee pain and gets a good benefit.   She tolerates it well.    She sees Dr. Rolena Infante and Dr. Nelva Bush for Pain.  She also has LBP from facet hypertrophy and spondylolisthesis at L4L5.     ESI injections have helped in past.  MS History: In  2006 and 2007 she presented with headaches. Headaches were bilateral and were associated with visual aura. Pain was throbbing and she had associated photophobia and phonophobia.  Mrs. Dahan underwent an MRI of the brain and an MR angiogram. The angiogram was normal but the MRI of the brain showed multiple white matter foci in a nonspecific pattern. She reports that Dr. Doy Mince did a lumbar puncture and she was told that the results were abnormal, consistent with MS. She was placed on Betaseron but had difficulties tolerating it. She has not been on any DMT for her MS  over the last 4 - 5 years.  She did not have any definite exacerbations.  Specifically, there were no episodes of numbness, weakness, clumsiness or visual change that came on over a short period of time.  A repeat MRI of the brain showed that the might of been some progression of the MRI changes. An MRI of the thoracic spine did not show any spinal lesions.    DATA :   I reviewed her MRI images from 12//2016.  She has multiple white matter lesions and the majority are round and either subcortical or in the deep white matter.  A couple smaller ones are in the periventricular white matter.   There is symmetric hyperintensity in the mid midbrain.   I reviewed recent labwork showing normal vasculitis labs but elevated homocysteine.     MRI of the thoracic spine at that time showed no spinal cord lesions. MRI of the lumbar spine shows degenerative changes at  a worse at L4-L5. She has 4 mm of anterolisthesis at that level    REVIEW OF SYSTEMS:  Constitutional: No fevers, chills, sweats, or change in appetite Eyes: No double vision,.   She notes eye pain Ear, nose and throat: No hearing loss, ear pain, nasal congestion, sore throat Cardiovascular: No chest pain, palpitations Respiratory:  No shortness of breath at rest or with exertion.   No wheezes GastrointestinaI: No nausea, vomiting, diarrhea, abdominal pain, fecal incontinence.   She has a hiatal hernia.  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   Genitourinary:  reports frequency and has 2 x nocturia.  No nocturia. Musculoskeletal:  No upper chest or  neck pain.  She reports lower back pain and has L4L5 DJD Integumentary: No rash, pruritus, skin lesions Neurological: as above Psychiatric: No depression at this time.  No anxiety Endocrine: Her weight is stable.   She has had hot flashes recently Hematologic/Lymphatic:  No anemia, purpura, petechiae. Allergic/Immunologic: No itchy/runny eyes, nasal congestion, recent allergic reactions, rashes  ALLERGIES: No Known Allergies  HOME MEDICATIONS: Outpatient Prescriptions Prior to Visit  Medication Sig Dispense Refill  . aspirin 81 MG tablet Take 81 mg by mouth daily.    Marland Kitchen atorvastatin (LIPITOR) 80 MG tablet Take 1 tablet (80 mg total) by mouth daily. 90 tablet 1  . FLUoxetine (PROZAC) 20 MG capsule Take 1 capsule (20 mg total) by mouth daily. 90 capsule 2  . FLUoxetine (PROZAC) 40 MG capsule Take 1 capsule (40 mg total) by mouth daily. 90 capsule 1  . folic acid-pyridoxine-cyancobalamin (FOLTX) 2.5-25-2 MG TABS tablet Take 1 tablet by mouth daily. 90 each 3  . gabapentin (NEURONTIN) 600 MG tablet Take 1 tablet (600 mg total) by mouth 3 (three) times daily. 270 tablet 3  . HYDROcodone-acetaminophen (NORCO/VICODIN) 5-325 MG per tablet Take 1 tablet by mouth every 6 (six) hours as needed for moderate pain.    . metaxalone  (SKELAXIN) 800 MG tablet Take 800 mg by mouth as needed.     . mometasone (NASONEX) 50 MCG/ACT nasal spray Place 2 sprays into the nose daily. 51 g 1  . Multiple Vitamin (MULTIVITAMIN WITH MINERALS) TABS tablet Take 1 tablet by mouth daily.    Marland Kitchen oxybutynin (DITROPAN-XL) 10 MG 24 hr tablet Take 1 tablet at bedtime  (discontinue vesicare due  to cost) 90 tablet 3  . pantoprazole (PROTONIX) 40 MG tablet Take 1 tablet (40 mg total) by mouth daily. 90 tablet 3  . triamterene-hydrochlorothiazide (MAXZIDE-25) 37.5-25 MG tablet Take  1 tablet by mouth daily. 90 tablet 1  . acyclovir (ZOVIRAX) 400 MG tablet Take 1 tablet (400 mg total) by mouth 3 (three) times daily. (Patient not taking: Reported on 09/03/2015) 21 tablet 0  . ARIPiprazole (ABILIFY) 5 MG tablet Take 1 tablet (5 mg total) by mouth daily. 90 tablet 2   Facility-Administered Medications Prior to Visit  Medication Dose Route Frequency Provider Last Rate Last Dose  . gadopentetate dimeglumine (MAGNEVIST) injection 20 mL  20 mL Intravenous Once PRN Britt Bottom, MD        PAST MEDICAL HISTORY: Past Medical History  Diagnosis Date  . Hypertension   . Hyperlipidemia   . MS (multiple sclerosis) ([place])     2007  . Back pain   . Hiatal hernia   . Schatzki's ring     PAST SURGICAL HISTORY: Past Surgical History  Procedure Laterality Date  . Abdominal hysterectomy  2005    fibroids  . Esophagogastroduodenoscopy N/A 09/18/2013    Procedure: ESOPHAGOGASTRODUODENOSCOPY (EGD);  Surgeon: Jerene Bears, MD;  Location: [facility];  Service: Gastroenterology;  Laterality: N/A;    FAMILY HISTORY: Family History  Problem Relation Age of Onset  . Hypertension Mother   . Hyperlipidemia Mother   . Depression Mother   . Hypertension Father   . Colon cancer Neg Hx   . Esophageal cancer Neg Hx   . Rectal cancer Neg Hx   . Stomach cancer Neg Hx     SOCIAL HISTORY:  Social History   Social History  . Marital Status: Married    Spouse  Name: N/A  . Number of Children: N/A  . Years of Education: N/A   Occupational History  . Not on file.   Social History Main Topics  . Smoking status: Never Smoker   . Smokeless tobacco: Never Used  . Alcohol Use: No  . Drug Use: No  . Sexual Activity: Yes    Birth Control/ Protection: Surgical   Other Topics Concern  . Not on file   Social History Narrative     PHYSICAL EXAM  Filed Vitals:   09/03/15 1522  BP: 140/88  Pulse: 74  Resp: 18  Height: 5\' 3"  (1.6 m)  Weight: 206 lb (93.441 kg)    Body mass index is 36.5 kg/(m^2).   General: The patient is well-developed and well-nourished and in no acute distress.  Affect is normal.   Musculoskeletal:   She has mild tenderness in the lower lumbar spine there is no tenderness in the muscles of the upper back or chest.   Neurologic Exam  Mental status: The patient is alert and oriented x 3 at the time of the examination. The patient has apparent normal recent and remote memory, with an apparently normal attention span and concentration ability.   Speech is normal.  Cranial nerves: Extraocular movements are full. Facial symmetry is present. There is good facial sensation to soft touch bilaterally.Facial strength is normal.  Trapezius and sternocleidomastoid strength is normal. No dysarthria is noted.     Motor:  Muscle bulk is normal. Tone was normal in the arms but slightly increased in the legs, a little more on the right. Strength is  5 / 5 in all 4 extremities.   Sensory: Sensory testing is intact to touch in all 4 extremities.  Coordination: Cerebellar testing reveals good finger-nose-finger   Gait and station: The station is stable.. Gait was normal. Tandem walk was mildly wide.   Borderline Romberg.  Reflexes: Deep tendon reflexes are very brisk in the legs and arms bilaterally.     DIAGNOSTIC DATA (LABS, IMAGING, TESTING) - I reviewed patient records, labs, notes, testing and imaging myself where  available.  Lab Results  Component Value Date   WBC 7.2 11/28/2014   HGB 12.0 11/28/2014   HCT 36.8 11/28/2014   MCV 77.1* 11/28/2014   PLT 308 11/28/2014      Component Value Date/Time   NA 137 05/09/2015 1023   K 4.1 05/09/2015 1023   CL 103 05/09/2015 1023   CO2 25 05/09/2015 1023   GLUCOSE 84 05/09/2015 1023   BUN 23 05/09/2015 1023   CREATININE 0.85 05/09/2015 1023   CREATININE 0.83 09/25/2013 0430   CALCIUM 9.5 05/09/2015 1023   PROT 7.2 05/09/2015 1023   ALBUMIN 4.3 05/09/2015 1023   AST 17 05/09/2015 1023   ALT 16 05/09/2015 1023   ALKPHOS 79 05/09/2015 1023   BILITOT 0.5 05/09/2015 1023   GFRNONAA 77 05/09/2015 1023   GFRNONAA 78* 09/25/2013 0430   GFRAA 89 05/09/2015 1023   GFRAA >90 09/25/2013 0430   Lab Results  Component Value Date   CHOL 146 05/09/2015   HDL 38* 05/09/2015   LDLCALC 79 05/09/2015   TRIG 147 05/09/2015   CHOLHDL 3.8 05/09/2015   Lab Results  Component Value Date   HGBA1C 5.9* 05/09/2015   No results found for: VITAMINB12 Lab Results  Component Value Date   TSH 2.732 05/17/2014       ASSESSMENT AND PLAN  Multiple sclerosis ([place])  Homocysteinemia ([place]) - Plan: folic acid-pyridoxine-cyancobalamin (FOLTX) 2.5-25-2 MG TABS tablet  Obstructive sleep apnea  Myalgia and myositis  Dysesthesia  Depression with anxiety  Other fatigue  Urinary frequency    1.   She will continue off DMT's for the MS and we will check MRI every 2 years or so to r/o subclinical progression.   If MRI changes occur or any exacerbation, we will reconsider therapy for the MS.    2.   Encouraged continued use of CPAP. We discussed trying to increase her compliance to use more than 4 hours almost every night.  3.  Continue current medications . She will continue to see Psych and Pain management for her other issues.   4.  Return in 12 months or sooner if there are new or worsening neurologic symptoms.   Magenta Schmiesing A. Felecia Shelling, MD, PhD [id number], [id number]  PM Certified in Neurology, Clinical Neurophysiology, Sleep Medicine, Pain Medicine and Neuroimaging  [facility] [address], [place], [place] [address] [phone]

## 2015-09-05 ENCOUNTER — Ambulatory Visit (INDEPENDENT_AMBULATORY_CARE_PROVIDER_SITE_OTHER): Payer: BLUE CROSS/BLUE SHIELD | Admitting: Psychiatry

## 2015-09-05 ENCOUNTER — Encounter (HOSPITAL_COMMUNITY): Payer: Self-pay | Admitting: Psychiatry

## 2015-09-05 DIAGNOSIS — F331 Major depressive disorder, recurrent, moderate: Secondary | ICD-10-CM | POA: Diagnosis not present

## 2015-09-05 NOTE — Progress Notes (Signed)
           THERAPIST PROGRESS NOTE  Session Time:    Thursday   09/05/2015 10:07 AM - 11:03 AM       Participation Level: Active  Behavioral Response: CasualAlertAnxious and Depressed  Type of Therapy: Individual Therapy        Treatment Goals:  1. Learn and implement coping and behavioral strategies to overcome depression.     2. Verbalize an understanding and resolution of current interpersonal problems.     3. Learn and implement conflict resolution skills to resolve interpersonal problems.   Treatment Goals addressed: 2,3  Interventions: Supportive, ACT, CBT  Summary: Pamela Walters is a 55 y.o. female who is referred for services by PCP Dr. Moshe Cipro due to patient suffering symptoms of depression. Patient reports several family issues.  She reports becoming very stressed and depressed last year when her 73 year old granddaughter attempted suicide. She continues to worry about granddaughter who resides with patient's daughter who did not reveal to patient that granddaughter was having any problems until the suicide attempt occurred.  Patient reports strained relationship with daughter as she and her husband have raised her daughter's oldest two children. Her daughter's third child was raised by another relative. Daughter has made poor choices and continues to to have difficulty making responsible choices which leads to constant tension in the relationship between patient and daughter per patient's report.  Patient's daughter lost her job last year due to legal issues and has been unable to find a job. Patient reports stress related to daughter now constantly asking for money. 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   Patient reports increased stress and frustration since last session as her daughter continues to constantly ask patient and her husband for money. Patient reports increased difficulty setting boundaries with  daughter recently as patient has been very tired due to working excessive hours. She expresses disappointment, resentment, and frustration. She has experienced increased negative thinking. Patient reports she has.been journaling as she has been busy with work. She is pleased she passed her chemistry class with a B. 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 Patient is looking forward to going to the beach this weekend to help her granddaughter and is hopeful she will be able to rest.   Therapist Response:   Reviewed symptoms, facilitated expression of feelings, discussed importance of self-care, discussed lapse versus relapse, discussed thought patterns about interaction with daughter and effects on patient's mood and behavior, assisted patient identify areas she can change, discussed ways to improve assertiveness skills in relationship with daughter, reviewed relaxation techniques, encouraged patient to begin journaling,   Plan: Return again in 2 weeks.   Diagnosis: Axis I: Major Depressive Disorder, Recurrent, Moderate    Axis II: No diagnosis    Midge Momon, LCSW 09/05/2015

## 2015-09-05 NOTE — Patient Instructions (Signed)
Discussed orally 

## 2015-09-24 ENCOUNTER — Ambulatory Visit (HOSPITAL_COMMUNITY): Payer: Self-pay | Admitting: Psychiatry

## 2015-09-25 ENCOUNTER — Encounter (HOSPITAL_COMMUNITY): Payer: Self-pay | Admitting: Psychiatry

## 2015-10-10 ENCOUNTER — Ambulatory Visit (HOSPITAL_COMMUNITY): Payer: BLUE CROSS/BLUE SHIELD | Admitting: Psychiatry

## 2015-10-10 ENCOUNTER — Encounter (HOSPITAL_COMMUNITY): Payer: Self-pay | Admitting: Psychiatry

## 2015-12-05 ENCOUNTER — Ambulatory Visit: Payer: Self-pay | Admitting: Family Medicine

## 2016-01-04 ENCOUNTER — Other Ambulatory Visit: Payer: Self-pay | Admitting: Family Medicine

## 2016-01-04 DIAGNOSIS — I10 Essential (primary) hypertension: Secondary | ICD-10-CM

## 2016-01-06 ENCOUNTER — Other Ambulatory Visit: Payer: Self-pay

## 2016-01-06 DIAGNOSIS — I10 Essential (primary) hypertension: Secondary | ICD-10-CM

## 2016-01-06 MED ORDER — TRIAMTERENE-HCTZ 37.5-25 MG PO TABS: 1.0000 | ORAL_TABLET | Freq: Every day | ORAL | 0 refills | Status: DC

## 2016-02-16 DIAGNOSIS — S93402A Sprain of unspecified ligament of left ankle, initial encounter: Secondary | ICD-10-CM | POA: Diagnosis not present

## 2016-02-16 DIAGNOSIS — S93602A Unspecified sprain of left foot, initial encounter: Secondary | ICD-10-CM | POA: Diagnosis not present

## 2016-02-20 ENCOUNTER — Encounter: Payer: Self-pay | Admitting: Family Medicine

## 2016-02-20 ENCOUNTER — Telehealth: Payer: Self-pay

## 2016-02-20 DIAGNOSIS — I10 Essential (primary) hypertension: Secondary | ICD-10-CM

## 2016-02-20 DIAGNOSIS — E785 Hyperlipidemia, unspecified: Secondary | ICD-10-CM | POA: Diagnosis not present

## 2016-02-20 DIAGNOSIS — R7301 Impaired fasting glucose: Secondary | ICD-10-CM | POA: Diagnosis not present

## 2016-02-20 DIAGNOSIS — Z1159 Encounter for screening for other viral diseases: Secondary | ICD-10-CM

## 2016-02-20 DIAGNOSIS — E559 Vitamin D deficiency, unspecified: Secondary | ICD-10-CM

## 2016-02-20 NOTE — Telephone Encounter (Signed)
LABS ORDERED.

## 2016-02-21 ENCOUNTER — Other Ambulatory Visit: Payer: Self-pay | Admitting: Family Medicine

## 2016-02-21 ENCOUNTER — Ambulatory Visit (HOSPITAL_COMMUNITY)
Admission: RE | Admit: 2016-02-21 | Discharge: 2016-02-21 | Disposition: A | Discharge status: Home / Self Care | Payer: BLUE CROSS/BLUE SHIELD | Source: Ambulatory Visit | Attending: Family Medicine | Admitting: Family Medicine

## 2016-02-21 ENCOUNTER — Encounter: Payer: Self-pay | Admitting: Family Medicine

## 2016-02-21 ENCOUNTER — Ambulatory Visit (INDEPENDENT_AMBULATORY_CARE_PROVIDER_SITE_OTHER): Payer: BLUE CROSS/BLUE SHIELD | Admitting: Family Medicine

## 2016-02-21 ENCOUNTER — Telehealth: Payer: Self-pay | Admitting: Family Medicine

## 2016-02-21 VITALS — BP 138/88 | HR 64 | Temp 98.7°F | Resp 16 | Ht 63.5 in | Wt 188.0 lb

## 2016-02-21 DIAGNOSIS — M7989 Other specified soft tissue disorders: Secondary | ICD-10-CM | POA: Insufficient documentation

## 2016-02-21 DIAGNOSIS — X58XXXS Exposure to other specified factors, sequela: Secondary | ICD-10-CM | POA: Insufficient documentation

## 2016-02-21 DIAGNOSIS — S93402S Sprain of unspecified ligament of left ankle, sequela: Secondary | ICD-10-CM | POA: Diagnosis not present

## 2016-02-21 DIAGNOSIS — S93401S Sprain of unspecified ligament of right ankle, sequela: Secondary | ICD-10-CM

## 2016-02-21 DIAGNOSIS — R937 Abnormal findings on diagnostic imaging of other parts of musculoskeletal system: Secondary | ICD-10-CM | POA: Insufficient documentation

## 2016-02-21 LAB — LIPID PANEL
CHOLESTEROL: 217 mg/dL — AB (ref 125–200)
HDL: 35 mg/dL — ABNORMAL LOW (ref >=46)
LDL Cholesterol: 145 mg/dL — ABNORMAL HIGH (ref <130)
TRIGLYCERIDES: 186 mg/dL — AB (ref <150)
Total CHOL/HDL Ratio: 6.2 Ratio — ABNORMAL HIGH (ref <=5.0)
VLDL: 37 mg/dL — AB (ref <30)

## 2016-02-21 LAB — HEMOGLOBIN A1C
HEMOGLOBIN A1C: 5.4 % (ref <5.7)
MEAN PLASMA GLUCOSE: 108 mg/dL

## 2016-02-21 LAB — CBC
HCT: 37.6 % (ref 35.0–45.0)
HEMOGLOBIN: 12.6 g/dL (ref 11.7–15.5)
MCH: 26.3 pg — ABNORMAL LOW (ref 27.0–33.0)
MCHC: 33.5 g/dL (ref 32.0–36.0)
MCV: 78.5 fL — AB (ref 80.0–100.0)
MPV: 9.2 fL (ref 7.5–12.5)
PLATELETS: 333 10*3/uL (ref 140–400)
RBC: 4.79 MIL/uL (ref 3.80–5.10)
RDW: 16.1 % — ABNORMAL HIGH (ref 11.0–15.0)
WBC: 5.9 10*3/uL (ref 3.8–10.8)

## 2016-02-21 LAB — COMPLETE METABOLIC PANEL WITH GFR
ALT: 20 U/L (ref 6–29)
AST: 20 U/L (ref 10–35)
Albumin: 4.3 g/dL (ref 3.6–5.1)
Alkaline Phosphatase: 69 U/L (ref 33–130)
BILIRUBIN TOTAL: 0.6 mg/dL (ref 0.2–1.2)
BUN: 14 mg/dL (ref 7–25)
CALCIUM: 9.7 mg/dL (ref 8.6–10.4)
CHLORIDE: 103 mmol/L (ref 98–110)
CO2: 26 mmol/L (ref 20–31)
CREATININE: 0.87 mg/dL (ref 0.50–1.05)
GFR, EST AFRICAN AMERICAN: 86 mL/min (ref >=60)
GFR, Est Non African American: 74 mL/min (ref >=60)
Glucose, Bld: 95 mg/dL (ref 65–99)
Potassium: 3.6 mmol/L (ref 3.5–5.3)
Sodium: 141 mmol/L (ref 135–146)
TOTAL PROTEIN: 7.1 g/dL (ref 6.1–8.1)

## 2016-02-21 LAB — TSH: TSH: 4.02 m[IU]/L

## 2016-02-21 NOTE — Telephone Encounter (Signed)
-----   Message from Raylene Everts, MD sent at 02/21/2016 10:58 AM EDT ----- Call Rosan and let her know the ankle is not broken

## 2016-02-21 NOTE — Telephone Encounter (Signed)
Called pt, aware of x ray results.

## 2016-02-21 NOTE — Telephone Encounter (Signed)
-----   Message from Raylene Everts, MD sent at 02/21/2016 10:58 AM EDT ----- Call Jaqulyn and let her know the ankle is not broken

## 2016-02-21 NOTE — Progress Notes (Signed)
Chief Complaint  Patient presents with  . Follow-up    left ankle   Ankle sprain one week ago. Was seen at urgent care center. X-ray was negative. Was given an Ace wrap. Has used ice Ace elevation and ibuprofen. Still cannot bear weight comfortably. Needs a note for work. Is concerned about the amount of swelling and discoloration persisting. No prior ankle problems. Urgent care records not available  Patient Active Problem List   Diagnosis Date Noted  . Acute cystitis without hematuria 06/21/2015  . Oral herpes 06/21/2015  . Allergic rhinitis 06/20/2015  . Urinary frequency 03/04/2015  . Dysesthesia 03/04/2015  . Obstructive sleep apnea 10/01/2014  . Increased homocysteine ([place]) 10/01/2014  . Snoring 07/13/2014  . Myalgia and myositis 07/13/2014  . Urinary incontinence 05/30/2014  . Chronic back pain greater than 3 months duration 10/30/2013  . Esophageal rupture 09/19/2013  . Esophageal obstruction due to food impaction 09/18/2013  . Back pain with radiation 02/05/2013  . DISORDER OF BONE AND CARTILAGE UNSPECIFIED 12/16/2009  . Obesity 04/08/2008  . Multiple sclerosis ([place]) 04/04/2008  . Other fatigue 04/04/2008  . Depression with anxiety 12/18/2007  . HEADACHE 12/18/2007  . Hyperlipidemia LDL goal <100 12/16/2007  . Essential hypertension 12/16/2007    Outpatient Encounter Prescriptions as of 02/21/2016  Medication Sig  . acyclovir (ZOVIRAX) 400 MG tablet Take 1 tablet (400 mg total) by mouth 3 (three) times daily.  Marland Kitchen aspirin 81 MG tablet Take 81 mg by mouth daily.  Marland Kitchen atorvastatin (LIPITOR) 80 MG tablet Take 1 tablet (80 mg total) by mouth daily.  Marland Kitchen FLUoxetine (PROZAC) 20 MG capsule Take 1 capsule (20 mg total) by mouth daily.  Marland Kitchen FLUoxetine (PROZAC) 40 MG capsule Take 1 capsule (40 mg total) by mouth daily.  . folic acid-pyridoxine-cyancobalamin (FOLTX) 2.5-25-2 MG TABS tablet Take 1 tablet by mouth daily.  Marland Kitchen gabapentin (NEURONTIN) 600 MG tablet Take 1 tablet (600 mg  total) by mouth 3 (three) times daily.  Marland Kitchen HYDROcodone-acetaminophen (NORCO/VICODIN) 5-325 MG per tablet Take 1 tablet by mouth every 6 (six) hours as needed for moderate pain.  . metaxalone (SKELAXIN) 800 MG tablet Take 800 mg by mouth as needed.   . mometasone (NASONEX) 50 MCG/ACT nasal spray Place 2 sprays into the nose daily.  . Multiple Vitamin (MULTIVITAMIN WITH MINERALS) TABS tablet Take 1 tablet by mouth daily.  Marland Kitchen oxybutynin (DITROPAN-XL) 10 MG 24 hr tablet Take 1 tablet (10 mg total) by mouth at bedtime.  . pantoprazole (PROTONIX) 40 MG tablet Take 1 tablet (40 mg total) by mouth daily.  Marland Kitchen triamterene-hydrochlorothiazide (MAXZIDE-25) 37.5-25 MG tablet Take 1 tablet by mouth daily.  . ARIPiprazole (ABILIFY) 5 MG tablet Take 1 tablet (5 mg total) by mouth daily.   Facility-Administered Encounter Medications as of 02/21/2016  Medication  . gadopentetate dimeglumine (MAGNEVIST) injection 20 mL    No Known Allergies  Review of Systems  Musculoskeletal: Positive for arthralgias and gait problem.  All other systems reviewed and are negative.  patient states she is otherwise well, ankle pain is only complaint    BP 138/88 (BP Location: Right Arm, Patient Position: Sitting, Cuff Size: Normal)   Pulse 64   Temp 98.7 F (37.1 C) (Oral)   Resp 16   Ht 5' 3.5" (1.613 m)   Wt 188 lb (85.3 kg)   SpO2 98%   BMI 32.78 kg/m   Physical Exam  Constitutional: She is oriented to person, place, and time. She appears well-developed and well-nourished.  No distress.  HENT:  Head: Normocephalic.  Mouth/Throat: Oropharynx is clear and moist.  Musculoskeletal: Normal range of motion.  Left ankle has normal range of motion. There is swelling and tenderness in the lateral ankle. Tenderness point over the lateral malleolus. Ligament laxity to stress testing of lateral ankle.  Neurological: She is alert and oriented to person, place, and time.  Antalgic gait  Skin: Skin is warm and dry.  Bruising  lateral left ankle  Psychiatric: She has a normal mood and affect. Her behavior is normal. Thought content normal.    ASSESSMENT/PLAN:  1. Severe ankle sprain, right, sequela 1. Repeat ankle x-ray to rule out occult fracture 2. Continue ice elevation and ibuprofen 3. Aircast brace worn inside lace up should 4. Note is given for work 5. Recovery from significant ankle sprain is discussed. Expect need for brace for 6-8 weeks. May need physical therapy. May need orthopedic referral if fails to improve.   Patient Instructions  Ice, elevate limit weight bearing Ibuprofen for pain  go get x ray Make sure I have your phone number to call  Ankle brace at all times when up Wear inside a lace up shoe  Off work another week Call for problems   Raylene Everts, MD

## 2016-02-21 NOTE — Patient Instructions (Signed)
Ice, elevate limit weight bearing Ibuprofen for pain  go get x ray Make sure I have your phone number to call  Ankle brace at all times when up Wear inside a lace up shoe  Off work another week Call for problems

## 2016-02-21 NOTE — Telephone Encounter (Signed)
-----   Message from Raylene Everts, MD sent at 02/21/2016 10:58 AM EDT ----- Call Elene and let her know the ankle is not broken

## 2016-02-22 DIAGNOSIS — S93491A Sprain of other ligament of right ankle, initial encounter: Secondary | ICD-10-CM | POA: Diagnosis not present

## 2016-02-22 LAB — VITAMIN D 25 HYDROXY (VIT D DEFICIENCY, FRACTURES): Vit D, 25-Hydroxy: 30 ng/mL (ref 30–100)

## 2016-02-22 LAB — HEPATITIS C ANTIBODY: HCV Ab: NEGATIVE

## 2016-03-03 ENCOUNTER — Other Ambulatory Visit: Payer: Self-pay | Admitting: Family Medicine

## 2016-03-03 DIAGNOSIS — Z1231 Encounter for screening mammogram for malignant neoplasm of breast: Secondary | ICD-10-CM

## 2016-03-04 ENCOUNTER — Other Ambulatory Visit (HOSPITAL_COMMUNITY)
Admission: RE | Admit: 2016-03-04 | Discharge: 2016-03-04 | Disposition: A | Discharge status: Home / Self Care | Payer: BLUE CROSS/BLUE SHIELD | Source: Ambulatory Visit | Attending: Family Medicine | Admitting: Family Medicine

## 2016-03-04 ENCOUNTER — Other Ambulatory Visit: Payer: Self-pay

## 2016-03-04 ENCOUNTER — Ambulatory Visit (INDEPENDENT_AMBULATORY_CARE_PROVIDER_SITE_OTHER): Payer: BLUE CROSS/BLUE SHIELD | Admitting: Family Medicine

## 2016-03-04 ENCOUNTER — Encounter: Payer: Self-pay | Admitting: Internal Medicine

## 2016-03-04 ENCOUNTER — Encounter: Payer: Self-pay | Admitting: Family Medicine

## 2016-03-04 VITALS — BP 116/78 | HR 77 | Resp 16 | Ht 64.0 in | Wt 196.1 lb

## 2016-03-04 DIAGNOSIS — R0789 Other chest pain: Secondary | ICD-10-CM | POA: Diagnosis not present

## 2016-03-04 DIAGNOSIS — E785 Hyperlipidemia, unspecified: Secondary | ICD-10-CM

## 2016-03-04 DIAGNOSIS — G35 Multiple sclerosis: Secondary | ICD-10-CM

## 2016-03-04 DIAGNOSIS — IMO0001 Reserved for inherently not codable concepts without codable children: Secondary | ICD-10-CM

## 2016-03-04 DIAGNOSIS — R9431 Abnormal electrocardiogram [ECG] [EKG]: Secondary | ICD-10-CM

## 2016-03-04 DIAGNOSIS — Z1211 Encounter for screening for malignant neoplasm of colon: Secondary | ICD-10-CM

## 2016-03-04 DIAGNOSIS — N3000 Acute cystitis without hematuria: Secondary | ICD-10-CM

## 2016-03-04 DIAGNOSIS — M25572 Pain in left ankle and joints of left foot: Secondary | ICD-10-CM

## 2016-03-04 DIAGNOSIS — R8290 Unspecified abnormal findings in urine: Secondary | ICD-10-CM

## 2016-03-04 DIAGNOSIS — F418 Other specified anxiety disorders: Secondary | ICD-10-CM

## 2016-03-04 DIAGNOSIS — R002 Palpitations: Secondary | ICD-10-CM

## 2016-03-04 DIAGNOSIS — E6609 Other obesity due to excess calories: Secondary | ICD-10-CM

## 2016-03-04 DIAGNOSIS — I10 Essential (primary) hypertension: Secondary | ICD-10-CM

## 2016-03-04 LAB — POCT URINALYSIS DIPSTICK
BILIRUBIN UA: NEGATIVE
Blood, UA: NEGATIVE
Glucose, UA: NEGATIVE
KETONES UA: NEGATIVE
NITRITE UA: NEGATIVE
PH UA: 5.5
PROTEIN UA: NEGATIVE
UROBILINOGEN UA: 0.2

## 2016-03-04 NOTE — Assessment & Plan Note (Signed)
Will refer to urology if not infected, assures me that water intake daily is adequate, approx 1 year history  [facility] in office

## 2016-03-04 NOTE — Patient Instructions (Addendum)
F/u in 4 month, call if you need me before  You are referred to orthopedics re left ankle  You are referred to cardiology re new chest pain and palpitations, EKG in office today  Change diet to lower cholesterol and heart disease risk please, continue lipitor as before  You are referred for colonoscopy  Urine is being tested and you will also be referred to urology based on concern expressed  Congrats on improved blood sugar   Fasting lipid, cmp  In 4 month

## 2016-03-04 NOTE — Assessment & Plan Note (Addendum)
4 month h/o intermittent chest pain and palpitations, high CV risk due to lipid profile Refer cardiology for further eval eKG in office today abnormal , cardiology to further eval

## 2016-03-07 ENCOUNTER — Encounter: Payer: Self-pay | Admitting: Family Medicine

## 2016-03-07 ENCOUNTER — Other Ambulatory Visit: Payer: Self-pay | Admitting: Family Medicine

## 2016-03-07 LAB — URINE CULTURE: Culture: 50000 — AB

## 2016-03-08 NOTE — Assessment & Plan Note (Signed)
Hyperlipidemia:Low fat diet discussed and encouraged.   Lipid Panel  Lab Results  Component Value Date   CHOL 217 (H) 02/20/2016   HDL 35 (L) 02/20/2016   LDLCALC 145 (H) 02/20/2016   TRIG 186 (H) 02/20/2016   CHOLHDL 6.2 (H) 02/20/2016   High risk ration forCVD, new chest pain , cardiology eval warranted

## 2016-03-08 NOTE — Assessment & Plan Note (Signed)
Stable, followed by neurology, on no medication

## 2016-03-08 NOTE — Assessment & Plan Note (Signed)
Controlled, no change in medication DASH diet and commitment to daily physical activity for a minimum of 30 minutes discussed and encouraged, as a part of hypertension management. The importance of attaining a healthy weight is also discussed.  BP/Weight 03/04/2016 02/21/2016 09/03/2015 06/20/2015 03/25/2015 [id number]  Systolic BP [id number]  Diastolic BP 78 88 88 80 - 88 82  Wt. (Lbs) 196.12 188 206 201.8 185 201.4 200  BMI 33.66 32.78 36.5 35.76 32.78 35.69 35.44  Some encounter information is confidential and restricted. Go to Review Flowsheets activity to see all data.

## 2016-03-08 NOTE — Assessment & Plan Note (Signed)
Improved. Pt applauded on succesful weight loss through lifestyle change, and encouraged to continue same. Weight loss goal set for the next several months.  

## 2016-03-08 NOTE — Assessment & Plan Note (Signed)
Followed by psych, stable on meds

## 2016-03-08 NOTE — Assessment & Plan Note (Signed)
Ongoing pain, following injury resulting in a sprain, now in a boot, returned to work 1 day ago, but significant limitation in mobility and also significant pain. Refer to ortho for ongoing management and work restrictions

## 2016-03-08 NOTE — Assessment & Plan Note (Signed)
Will follow c/s and treat if appropriate

## 2016-03-08 NOTE — Progress Notes (Signed)
Pamela Walters     MRN: [id number]      DOB: 1961-03-05   HPI Pamela Walters is here for follow up and re-evaluation of chronic medical conditions, medication management and review of any available recent lab and radiology data.  Preventive health is updated, specifically  Cancer screening and Immunization.  Needs colonoscopy and gyne exam Questions or concerns regarding consultations or procedures which the PT has had in the interim are  Addressed.Still has significant left ankle pain limiting ability to weigh bear and function on her job, started last night and currently in pain and limping The PT denies any adverse reactions to current medications since the last visit.  4 month h/o intermittent chest pain and palpitations, denies PND, orthopnea or leg swelling C/o chronic malodorous urine, wants to be checked , concerned re possible bladder cancer, as a friend was recently diagnosed  ROS Denies recent fever or chills. Denies sinus pressure, nasal congestion, ear pain or sore throat. Denies chest congestion, productive cough or wheezing.  Denies abdominal pain, nausea, vomiting,diarrhea or constipation.    Denies headaches, seizures, numbness, or tingling. Denies uncontrolled depression, anxiety or insomnia. Denies skin break down or rash.   PE  BP 116/78   Pulse 77   Resp 16   Ht 5\' 4"  (1.626 m)   Wt 196 lb 1.9 oz (89 kg)   SpO2 96%   BMI 33.66 kg/m   Patient alert and oriented and in no cardiopulmonary distress.  HEENT: No facial asymmetry, EOMI,   oropharynx pink and moist.  Neck supple no JVD, no mass.  Chest: Clear to auscultation bilaterally.No reproducible chest wall tenderness  CVS: S1, S2 no murmurs, no S3.Regular rate. EKG:No acute ischemia, prolonged QT   ABD: Soft non tender.   Ext: No edema  MS: Adequate ROM spine, shoulders, hips and knees.  Skin: Intact, no ulcerations or rash noted.  Psych: Good eye contact, normal affect. Memory intact not anxious  or depressed appearing.  CNS: CN 2-12 intact, power,  normal throughout.no focal deficits noted.   Assessment & Plan  Malodorous urine Will refer to urology if not infected, assures me that water intake daily is adequate, approx 1 year history  CCUA in office  Other chest pain 4 month h/o intermittent chest pain and palpitations, high CV risk due to lipid profile Refer cardiology for further eval eKG in office today abnormal , cardiology to further eval  Acute left ankle pain Ongoing pain, following injury resulting in a sprain, now in a boot, returned to work 1 day ago, but significant limitation in mobility and also significant pain. Refer to ortho for ongoing management and work restrictions  Acute cystitis without hematuria Will follow c/s and treat if appropriate  Essential hypertension Controlled, no change in medication DASH diet and commitment to daily physical activity for a minimum of 30 minutes discussed and encouraged, as a part of hypertension management. The importance of attaining a healthy weight is also discussed.  BP/Weight 03/04/2016 02/21/2016 09/03/2015 06/20/2015 03/25/2015 [id number]  Systolic BP [id number]  Diastolic BP 78 88 88 80 - 88 82  Wt. (Lbs) 196.12 188 206 201.8 185 201.4 200  BMI 33.66 32.78 36.5 35.76 32.78 35.69 35.44  Some encounter information is confidential and restricted. Go to Review Flowsheets activity to see all data.       Obesity Improved. Pt applauded on succesful weight loss through lifestyle change, and encouraged to continue same. Weight  loss goal set for the next several months.   Hyperlipidemia LDL goal <100 Hyperlipidemia:Low fat diet discussed and encouraged.   Lipid Panel  Lab Results  Component Value Date   CHOL 217 (H) 02/20/2016   HDL 35 (L) 02/20/2016   LDLCALC 145 (H) 02/20/2016   TRIG 186 (H) 02/20/2016   CHOLHDL 6.2 (H) 02/20/2016   High risk ration forCVD, new chest pain ,  cardiology eval warranted    Multiple sclerosis ([place]) Stable, followed by neurology, on no medication  Depression with anxiety Followed by psych, stable on meds

## 2016-03-09 ENCOUNTER — Other Ambulatory Visit: Payer: Self-pay

## 2016-03-09 MED ORDER — AMPICILLIN 250 MG PO CAPS: 250.0000 mg | ORAL_CAPSULE | Freq: Four times a day (QID) | ORAL | 0 refills | Status: DC

## 2016-03-10 ENCOUNTER — Ambulatory Visit (INDEPENDENT_AMBULATORY_CARE_PROVIDER_SITE_OTHER): Payer: BLUE CROSS/BLUE SHIELD | Admitting: Orthopedic Surgery

## 2016-03-10 ENCOUNTER — Encounter: Payer: Self-pay | Admitting: Orthopedic Surgery

## 2016-03-10 VITALS — BP 158/88 | HR 78 | Ht 64.0 in | Wt 192.0 lb

## 2016-03-10 DIAGNOSIS — S93492A Sprain of other ligament of left ankle, initial encounter: Secondary | ICD-10-CM

## 2016-03-10 NOTE — Progress Notes (Signed)
Patient ID: Pamela Walters, female   DOB: 1960/09/18, 55 y.o.   MRN: [id number]  Chief Complaint  Patient presents with  . Ankle Injury    [place]  02/14/2016, Left ankle pain     HPI Pamela Walters is a 55 y.o. female.  [place] off a golf cart on vacation 3 weeks ago  Complains of left ankle pain Dull ache lateral ankle on the left initially significant swelling and ecchymosis could not weight-bear. She was eventually treated with Aircast and Ace wrap can now weight-bear still has lateral pain. Context is getting better Modifying factors brace  Review of Systems Review of Systems 1. Denies numbness or tingling 2. Denies shortness of breath   Past Medical History:  Diagnosis Date  . Back pain   . Hiatal hernia   . Hyperlipidemia   . Hypertension   . MS (multiple sclerosis) ([place])    2007  . Multiple sclerosis ([place])   . Schatzki's ring     Past Surgical History:  Procedure Laterality Date  . ABDOMINAL HYSTERECTOMY  2005   fibroids  . depression    . ESOPHAGOGASTRODUODENOSCOPY N/A 09/18/2013   Procedure: ESOPHAGOGASTRODUODENOSCOPY (EGD);  Surgeon: Jerene Bears, MD;  Location: [facility];  Service: Gastroenterology;  Laterality: N/A;  . hypertension      Social History Social History  Substance Use Topics  . Smoking status: Never Smoker  . Smokeless tobacco: Never Used  . Alcohol use No    No Known Allergies  Current Meds  Medication Sig  . atorvastatin (LIPITOR) 80 MG tablet Take 1 tablet (80 mg total) by mouth daily.  . folic acid-pyridoxine-cyancobalamin (FOLTX) 2.5-25-2 MG TABS tablet Take 1 tablet by mouth daily.  . metaxalone (SKELAXIN) 800 MG tablet Take 800 mg by mouth as needed.   Marland Kitchen oxybutynin (DITROPAN-XL) 10 MG 24 hr tablet Take 1 tablet (10 mg total) by mouth at bedtime.  . pantoprazole (PROTONIX) 40 MG tablet Take 1 tablet (40 mg total) by mouth daily.  Marland Kitchen triamterene-hydrochlorothiazide (MAXZIDE-25) 37.5-25 MG tablet Take 1 tablet by mouth daily.       Physical Exam Physical Exam BP (!) 158/88   Pulse 78   Ht 5\' 4"  (1.626 m)   Wt 192 lb (87.1 kg)   BMI 32.96 kg/m   Gen. appearance. The patient is well-developed and well-nourished, grooming and hygiene are normal. There are no gross congenital abnormalities  The patient is alert and oriented to person place and time  Mood and affect are normal  Ambulation No limp at this time  Examination reveals the following: On inspection we find tenderness without swelling lateral ankle ATFL  With the range of motion of  left ankle is normal with painful inversion  Stability tests were normal    Strength tests revealed grade 5 motor strength  Skin we find no rash ulceration or erythema  Sensation remains intact  Impression vascular system shows no peripheral edema  Data Reviewed X-ray was negative, and independent interpretation 3 views left ankle no fracture mild plantar spur mild Achilles spur  Assessment    Encounter Diagnosis  Name Primary?  . Sprain of anterior talofibular ligament of left ankle, initial encounter Yes       Plan    Recommend weightbearing as tolerated ankle exercises protective bracing follow-up as needed       Arther Abbott 03/10/2016, 10:18 AM

## 2016-03-10 NOTE — Patient Instructions (Addendum)
Ankle Sprain Introduction An ankle sprain is a stretch or tear in one of the tough tissues (ligaments) in your ankle. Follow these instructions at home:  Rest your ankle.  Take over-the-counter and prescription medicines only as told by your doctor.  For 2-3 days, keep your ankle higher than the level of your heart (elevated) as much as possible.  If directed, put ice on the area:  Put ice in a plastic bag.  Place a towel between your skin and the bag.  Leave the ice on for 20 minutes, 2-3 times a day.  If you were given a brace:  Wear it as told.  Take it off to shower or bathe.  Try not to move your ankle much, but wiggle your toes from time to time. This helps to prevent swelling.  If you were given an elastic bandage (dressing):  Take it off when you shower or bathe.  Try not to move your ankle much, but wiggle your toes from time to time. This helps to prevent swelling.  Adjust the bandage to make it more comfortable if it feels too tight.  Loosen the bandage if you lose feeling in your foot, your foot tingles, or your foot gets cold and blue.  If you have crutches, use them as told by your doctor. Continue to use them until you can walk without feeling pain in your ankle. Contact a doctor if:  Your bruises or swelling are quickly getting worse.  Your pain does not get better after you take medicine. Get help right away if:  You cannot feel your toes or foot.  Your toes or your foot looks blue.  You have very bad pain that gets worse. This information is not intended to replace advice given to you by your health care provider. Make sure you discuss any questions you have with your health care provider. Document Released: 09/30/2007 Document Revised: 09/19/2015 Document Reviewed: 11/13/2014  2017 Elsevier  

## 2016-03-12 ENCOUNTER — Ambulatory Visit (HOSPITAL_COMMUNITY): Payer: Self-pay

## 2016-03-17 DIAGNOSIS — L853 Xerosis cutis: Secondary | ICD-10-CM | POA: Diagnosis not present

## 2016-03-17 DIAGNOSIS — L718 Other rosacea: Secondary | ICD-10-CM | POA: Diagnosis not present

## 2016-03-17 DIAGNOSIS — L821 Other seborrheic keratosis: Secondary | ICD-10-CM | POA: Diagnosis not present

## 2016-03-18 ENCOUNTER — Ambulatory Visit (HOSPITAL_COMMUNITY)
Admission: RE | Admit: 2016-03-18 | Discharge: 2016-03-18 | Disposition: A | Discharge status: Home / Self Care | Payer: BLUE CROSS/BLUE SHIELD | Source: Ambulatory Visit | Attending: Family Medicine | Admitting: Family Medicine

## 2016-03-18 DIAGNOSIS — Z1231 Encounter for screening mammogram for malignant neoplasm of breast: Secondary | ICD-10-CM | POA: Insufficient documentation

## 2016-03-23 ENCOUNTER — Telehealth: Payer: Self-pay | Admitting: Family Medicine

## 2016-03-23 NOTE — Telephone Encounter (Signed)
Unsure if pt was treated

## 2016-03-23 NOTE — Telephone Encounter (Signed)
I see she was treated will sign off

## 2016-04-13 ENCOUNTER — Other Ambulatory Visit: Payer: Self-pay | Admitting: Family Medicine

## 2016-04-13 ENCOUNTER — Encounter: Payer: Self-pay | Admitting: *Deleted

## 2016-04-13 ENCOUNTER — Ambulatory Visit (INDEPENDENT_AMBULATORY_CARE_PROVIDER_SITE_OTHER): Payer: BLUE CROSS/BLUE SHIELD | Admitting: Cardiology

## 2016-04-13 ENCOUNTER — Encounter: Payer: Self-pay | Admitting: Cardiology

## 2016-04-13 VITALS — BP 123/75 | HR 73 | Ht 63.0 in | Wt 195.0 lb

## 2016-04-13 DIAGNOSIS — R079 Chest pain, unspecified: Secondary | ICD-10-CM

## 2016-04-13 NOTE — Patient Instructions (Signed)
Medication Instructions:  Continue all current medications.  Labwork: none  Testing/Procedures: Your physician has requested that you have a lexiscan myoview. For further information please visit [email or web address]. Please follow instruction sheet, as given. Office will contact with results via phone or letter.    Follow-Up: To be determined, based on test results.   Any Other Special Instructions Will Be Listed Below (If Applicable).  If you need a refill on your cardiac medications before your next appointment, please call your pharmacy.

## 2016-04-13 NOTE — Progress Notes (Signed)
Clinical Summary Pamela Walters is a 55 y.o.female seen as a new consult for chest pain, she is referred by Moshe Cipro.   1. Chest pain - on and off for 6 months. Pressure like pain midchest. Dull pain that is constant, that can flare at times. Can be worst with exertion. 5/10 in severity. Can get dizzy with episodes. Varies in duration.  - DOE which is new. Example at [facility] parks recently, noticed significant DOE while walking.  - no LE edema.  CAD risk factors: HL, HTN, father heart troubles in 70s.     Past Medical History:  Diagnosis Date  . Back pain   . Hiatal hernia   . Hyperlipidemia   . Hypertension   . MS (multiple sclerosis) ([place])    2007  . Multiple sclerosis ([place])   . Schatzki's ring      No Known Allergies   Current Outpatient Prescriptions  Medication Sig Dispense Refill  . acyclovir (ZOVIRAX) 400 MG tablet Take 1 tablet (400 mg total) by mouth 3 (three) times daily. (Patient not taking: Reported on 03/10/2016) 21 tablet 0  . ampicillin (PRINCIPEN) 250 MG capsule Take 1 capsule (250 mg total) by mouth 4 (four) times daily. (Patient not taking: Reported on 03/10/2016) 20 capsule 0  . ARIPiprazole (ABILIFY) 5 MG tablet Take 1 tablet (5 mg total) by mouth daily. 90 tablet 2  . aspirin 81 MG tablet Take 81 mg by mouth daily.    Marland Kitchen atorvastatin (LIPITOR) 80 MG tablet Take 1 tablet (80 mg total) by mouth daily. 90 tablet 1  . FLUoxetine (PROZAC) 20 MG capsule Take 1 capsule (20 mg total) by mouth daily. 90 capsule 2  . FLUoxetine (PROZAC) 40 MG capsule Take 1 capsule (40 mg total) by mouth daily. 90 capsule 1  . folic acid-pyridoxine-cyancobalamin (FOLTX) 2.5-25-2 MG TABS tablet Take 1 tablet by mouth daily. 90 each 3  . gabapentin (NEURONTIN) 600 MG tablet Take 1 tablet (600 mg total) by mouth 3 (three) times daily. 270 tablet 3  . HYDROcodone-acetaminophen (NORCO/VICODIN) 5-325 MG per tablet Take 1 tablet by mouth every 6 (six) hours as needed for moderate pain.     . metaxalone (SKELAXIN) 800 MG tablet Take 800 mg by mouth as needed.     . mometasone (NASONEX) 50 MCG/ACT nasal spray Place 2 sprays into the nose daily. (Patient not taking: Reported on 03/10/2016) 51 g 1  . Multiple Vitamin (MULTIVITAMIN WITH MINERALS) TABS tablet Take 1 tablet by mouth daily.    Marland Kitchen oxybutynin (DITROPAN-XL) 10 MG 24 hr tablet Take 1 tablet (10 mg total) by mouth at bedtime. 90 tablet 3  . pantoprazole (PROTONIX) 40 MG tablet Take 1 tablet (40 mg total) by mouth daily. 90 tablet 3  . triamterene-hydrochlorothiazide (MAXZIDE-25) 37.5-25 MG tablet Take 1 tablet by mouth daily. 90 tablet 0   No current facility-administered medications for this visit.    Facility-Administered Medications Ordered in Other Visits  Medication Dose Route Frequency Provider Last Rate Last Dose  . gadopentetate dimeglumine (MAGNEVIST) injection 20 mL  20 mL Intravenous Once PRN Britt Bottom, MD         Past Surgical History:  Procedure Laterality Date  . ABDOMINAL HYSTERECTOMY  2005   fibroids  . depression    . ESOPHAGOGASTRODUODENOSCOPY N/A 09/18/2013   Procedure: ESOPHAGOGASTRODUODENOSCOPY (EGD);  Surgeon: Jerene Bears, MD;  Location: [facility];  Service: Gastroenterology;  Laterality: N/A;  . hypertension  No Known Allergies    Family History  Problem Relation Age of Onset  . Hypertension Mother   . Hyperlipidemia Mother   . Depression Mother   . Hypertension Father   . Colon cancer Neg Hx   . Esophageal cancer Neg Hx   . Rectal cancer Neg Hx   . Stomach cancer Neg Hx      Social History Pamela Walters reports that she has never smoked. She has never used smokeless tobacco. Pamela Walters reports that she does not drink alcohol.   Review of Systems CONSTITUTIONAL: No weight loss, fever, chills, weakness or fatigue.  HEENT: Eyes: No visual loss, blurred vision, double vision or yellow sclerae.No hearing loss, sneezing, congestion, runny nose or sore throat.  SKIN:  No rash or itching.  CARDIOVASCULAR: per HPI RESPIRATORY: No shortness of breath, cough or sputum.  GASTROINTESTINAL: No anorexia, nausea, vomiting or diarrhea. No abdominal pain or blood.  GENITOURINARY: No burning on urination, no polyuria NEUROLOGICAL: No headache, dizziness, syncope, paralysis, ataxia, numbness or tingling in the extremities. No change in bowel or bladder control.  MUSCULOSKELETAL: No muscle, back pain, joint pain or stiffness.  LYMPHATICS: No enlarged nodes. No history of splenectomy.  PSYCHIATRIC: No history of depression or anxiety.  ENDOCRINOLOGIC: No reports of sweating, cold or heat intolerance. No polyuria or polydipsia.  Marland Kitchen   Physical Examination Vitals:   04/13/16 1337  BP: 123/75  Pulse: 73   Vitals:   04/13/16 1337  Weight: 195 lb (88.5 kg)  Height: 5\' 3"  (1.6 m)    Gen: resting comfortably, no acute distress HEENT: no scleral icterus, pupils equal round and reactive, no palptable cervical adenopathy,  CV: RRR, no m/r/g, no jvd Resp: Clear to auscultation bilaterally GI: abdomen is soft, non-tender, non-distended, normal bowel sounds, no hepatosplenomegaly MSK: extremities are warm, no edema.  Skin: warm, no rash Neuro:  no focal deficits Psych: appropriate affect      Assessment and Plan  1. Chest pain - we will obta a lexiscan MPI to further evaluate. Unable to run on treadmill due to chronic knee pain.  - prior EKG reviwed and shows SR, LVH, no acute ischemic changes.   F/u pending stress results      Arnoldo Lenis, M.D.

## 2016-04-16 ENCOUNTER — Inpatient Hospital Stay (HOSPITAL_COMMUNITY): Admission: RE | Admit: 2016-04-16 | Payer: Self-pay | Source: Ambulatory Visit

## 2016-04-16 ENCOUNTER — Encounter (HOSPITAL_COMMUNITY)
Admission: RE | Admit: 2016-04-16 | Discharge: 2016-04-16 | Disposition: A | Discharge status: Home / Self Care | Payer: BLUE CROSS/BLUE SHIELD | Source: Ambulatory Visit | Attending: Cardiology | Admitting: Cardiology

## 2016-04-16 ENCOUNTER — Encounter (HOSPITAL_COMMUNITY): Payer: Self-pay

## 2016-04-16 DIAGNOSIS — R079 Chest pain, unspecified: Secondary | ICD-10-CM | POA: Diagnosis not present

## 2016-04-16 LAB — NM MYOCAR MULTI W/SPECT W/WALL MOTION / EF
CHL CUP NUCLEAR SSS: 3
CHL CUP RESTING HR STRESS: 71 {beats}/min
LHR: 0.41
LVDIAVOL: 55 mL (ref 46–106)
LVSYSVOL: 18 mL
NUC STRESS TID: 1.01
Peak HR: 113 {beats}/min
SDS: 3
SRS: 0

## 2016-04-16 MED ORDER — TECHNETIUM TC 99M TETROFOSMIN IV KIT
30.0000 | PACK | Freq: Once | INTRAVENOUS | Status: AC | PRN
  Administered 2016-04-16: 30 via INTRAVENOUS

## 2016-04-16 MED ORDER — TECHNETIUM TC 99M TETROFOSMIN IV KIT
10.0000 | PACK | Freq: Once | INTRAVENOUS | Status: AC | PRN
  Administered 2016-04-16: 10.8 via INTRAVENOUS

## 2016-04-16 MED ORDER — SODIUM CHLORIDE 0.9% FLUSH
10.0000 mL | INTRAVENOUS | Status: DC | PRN
  Administered 2016-04-16: 10 mL via INTRAVENOUS
  Filled 2016-04-16: qty 10

## 2016-04-16 MED ORDER — REGADENOSON 0.4 MG/5ML IV SOLN
0.4000 mg | Freq: Once | INTRAVENOUS | Status: AC | PRN
  Administered 2016-04-16: 0.4 mg via INTRAVENOUS
  Filled 2016-04-16: qty 5

## 2016-04-17 ENCOUNTER — Telehealth: Payer: Self-pay | Admitting: *Deleted

## 2016-04-17 MED ORDER — METOPROLOL TARTRATE 25 MG PO TABS: 12.5000 mg | ORAL_TABLET | Freq: Two times a day (BID) | ORAL | 3 refills | Status: DC

## 2016-04-17 NOTE — Telephone Encounter (Signed)
Pt aware and voiced understanding - appt made with ML 04/29/16 - lopressor sent to pharmacy

## 2016-04-17 NOTE — Telephone Encounter (Signed)
-----   Message from Arnoldo Lenis, MD sent at 04/17/2016 10:12 AM EST ----- Stress test shows a very small questionable area that could be a small blockage. Overall its consider a low risk finding and something we can try to treat with medications initially. . I would start lopressor 12.5mg  bid. If symptoms continue would need to consider a cath. F/u PA in [place] in 2 weeks.   Zandra Abts MD

## 2016-04-20 ENCOUNTER — Other Ambulatory Visit: Payer: Self-pay | Admitting: Family Medicine

## 2016-04-20 DIAGNOSIS — I10 Essential (primary) hypertension: Secondary | ICD-10-CM

## 2016-04-21 ENCOUNTER — Telehealth: Payer: Self-pay | Admitting: *Deleted

## 2016-04-21 NOTE — Telephone Encounter (Signed)
Pamela Walters,  This pt is cleared for anesthetic care at [facility].  Thanks,  Osvaldo Angst

## 2016-04-21 NOTE — Telephone Encounter (Signed)
Per cardiology they are treating her CAD medically and thus should be okay for [place] colonoscopy from cardiac standpoint, however, I am including her cardiologist, Dr. Harl Bowie to ensure he agrees Dr. Harl Bowie, Pamela Walters is due screening colonoscopy, in your opinion is this test okay for her at this time? Thanks for your input [name]

## 2016-04-21 NOTE — Telephone Encounter (Signed)
Dr. Hilarie Fredrickson and Jenny Reichmann,  This patient was seen on 04/13/16 for complaints of chest pain. Stress test results to patient on 04/17/16 indicate a "small blockage" that is initially going to be treated with medications. This is a screening colonoscopy. Ok to proceed or do we need to wait for further cardiac evaluation?  Thank you. Olivia Mackie

## 2016-04-22 ENCOUNTER — Other Ambulatory Visit (HOSPITAL_COMMUNITY)
Admission: AD | Admit: 2016-04-22 | Discharge: 2016-04-22 | Disposition: A | Discharge status: Home / Self Care | Payer: BLUE CROSS/BLUE SHIELD | Source: Other Acute Inpatient Hospital | Attending: Urology | Admitting: Urology

## 2016-04-22 ENCOUNTER — Ambulatory Visit (INDEPENDENT_AMBULATORY_CARE_PROVIDER_SITE_OTHER): Payer: BLUE CROSS/BLUE SHIELD | Admitting: Urology

## 2016-04-22 DIAGNOSIS — R3121 Asymptomatic microscopic hematuria: Secondary | ICD-10-CM | POA: Insufficient documentation

## 2016-04-22 DIAGNOSIS — R351 Nocturia: Secondary | ICD-10-CM

## 2016-04-22 LAB — URINALYSIS, COMPLETE (UACMP) WITH MICROSCOPIC
Bilirubin Urine: NEGATIVE
GLUCOSE, UA: NEGATIVE mg/dL
Hgb urine dipstick: NEGATIVE
Ketones, ur: NEGATIVE mg/dL
Leukocytes, UA: NEGATIVE
Nitrite: NEGATIVE
PROTEIN: NEGATIVE mg/dL
RBC / HPF: NONE SEEN RBC/hpf (ref 0–5)
Specific Gravity, Urine: 1.015 (ref 1.005–1.030)
pH: 6.5 (ref 5.0–8.0)

## 2016-04-22 NOTE — Telephone Encounter (Signed)
Noted. Thanks.

## 2016-04-22 NOTE — Telephone Encounter (Signed)
[place] for colonscopy. Her stress test is overall low risk, possible artifact vs small area of ischemia that is overall low risk. Colonoscopy is a low risk procedure. Based on total findings there is no contraindication to completing colonscopy   Carlyle Dolly MD

## 2016-04-29 ENCOUNTER — Other Ambulatory Visit (HOSPITAL_COMMUNITY)
Admission: RE | Admit: 2016-04-29 | Discharge: 2016-04-29 | Disposition: A | Discharge status: Home / Self Care | Payer: BLUE CROSS/BLUE SHIELD | Source: Ambulatory Visit | Attending: Physician Assistant | Admitting: Physician Assistant

## 2016-04-29 ENCOUNTER — Ambulatory Visit (INDEPENDENT_AMBULATORY_CARE_PROVIDER_SITE_OTHER): Payer: BLUE CROSS/BLUE SHIELD | Admitting: Physician Assistant

## 2016-04-29 ENCOUNTER — Encounter: Payer: Self-pay | Admitting: *Deleted

## 2016-04-29 ENCOUNTER — Encounter: Payer: Self-pay | Admitting: Physician Assistant

## 2016-04-29 ENCOUNTER — Ambulatory Visit (HOSPITAL_COMMUNITY)
Admission: RE | Admit: 2016-04-29 | Discharge: 2016-04-29 | Disposition: A | Discharge status: Home / Self Care | Payer: BLUE CROSS/BLUE SHIELD | Source: Ambulatory Visit | Attending: Physician Assistant | Admitting: Physician Assistant

## 2016-04-29 VITALS — BP 134/78 | HR 71 | Ht 63.0 in | Wt 198.0 lb

## 2016-04-29 DIAGNOSIS — I10 Essential (primary) hypertension: Secondary | ICD-10-CM | POA: Diagnosis not present

## 2016-04-29 DIAGNOSIS — R079 Chest pain, unspecified: Secondary | ICD-10-CM

## 2016-04-29 DIAGNOSIS — E785 Hyperlipidemia, unspecified: Secondary | ICD-10-CM | POA: Diagnosis not present

## 2016-04-29 LAB — CBC WITH DIFFERENTIAL/PLATELET
BASOS ABS: 0.1 10*3/uL (ref 0.0–0.1)
Basophils Relative: 1 %
EOS ABS: 0.2 10*3/uL (ref 0.0–0.7)
EOS PCT: 3 %
HCT: 36.9 % (ref 36.0–46.0)
Hemoglobin: 11.9 g/dL — ABNORMAL LOW (ref 12.0–15.0)
Lymphocytes Relative: 35 %
Lymphs Abs: 3 10*3/uL (ref 0.7–4.0)
MCH: 26 pg (ref 26.0–34.0)
MCHC: 32.2 g/dL (ref 30.0–36.0)
MCV: 80.6 fL (ref 78.0–100.0)
Monocytes Absolute: 1.1 10*3/uL — ABNORMAL HIGH (ref 0.1–1.0)
Monocytes Relative: 13 %
Neutro Abs: 4.3 10*3/uL (ref 1.7–7.7)
Neutrophils Relative %: 50 %
PLATELETS: 332 10*3/uL (ref 150–400)
RBC: 4.58 MIL/uL (ref 3.87–5.11)
RDW: 16.2 % — ABNORMAL HIGH (ref 11.5–15.5)
WBC: 8.7 10*3/uL (ref 4.0–10.5)

## 2016-04-29 LAB — BASIC METABOLIC PANEL
Anion gap: 7 (ref 5–15)
BUN: 19 mg/dL (ref 6–20)
CALCIUM: 9.5 mg/dL (ref 8.9–10.3)
CO2: 29 mmol/L (ref 22–32)
CREATININE: 0.86 mg/dL (ref 0.44–1.00)
Chloride: 100 mmol/L — ABNORMAL LOW (ref 101–111)
GFR calc non Af Amer: >60 mL/min (ref >60)
Glucose, Bld: 99 mg/dL (ref 65–99)
Potassium: 3.6 mmol/L (ref 3.5–5.1)
SODIUM: 136 mmol/L (ref 135–145)

## 2016-04-29 LAB — PROTIME-INR
INR: 0.94
PROTHROMBIN TIME: 12.6 s (ref 11.4–15.2)

## 2016-04-29 NOTE — Patient Instructions (Signed)
Your physician recommends that you schedule a follow-up appointment After Catheterization.  Your physician recommends that you have lab work, and chest x-ray done today.  Your physician has requested that you have a cardiac catheterization. Cardiac catheterization is used to diagnose and/or treat various heart conditions. Doctors may recommend this procedure for a number of different reasons. The most common reason is to evaluate chest pain. Chest pain can be a symptom of coronary artery disease (CAD), and cardiac catheterization can show whether plaque is narrowing or blocking your heart's arteries. This procedure is also used to evaluate the valves, as well as measure the blood flow and oxygen levels in different parts of your heart. For further information please visit [email or web address]. Please follow instruction sheet, as given.  If you need a refill on your cardiac medications before your next appointment, please call your pharmacy.  Thank you for choosing [facility]!

## 2016-04-29 NOTE — Progress Notes (Signed)
Cardiology Office Note    Date:  04/29/2016   ID:  Pamela Walters, DOB 10-10-1960, MRN [id number]  PCP:  [name], MD  Cardiologist: Dr. Harl Bowie   Chief Complaint  Patient presents with  . Follow-up    History of Present Illness:  Pamela Walters is a 56 y.o. female who was seen initially by Dr. Harl Bowie 04/13/16 for 6 months of chest pain and dyspnea on exertion. CAD risk factors include HLD, hypertension and father with heart trouble in his 37s. Lexi scan 04/16/16 showed a small mild intensity partially reversible apical inferior lateral defect suggesting either breast attenuation are small region of ischemia. LVEF 67%. It was a low risk study. Dr. Harl Bowie added Lopressor 12.5 mg twice a day with follow-up today. Dr. Harl Bowie who recommended If her symptoms continue consider cardiac catheterization.  Patient comes in today for follow-up. She continues to have chest pain and dyspnea on exertion. She says it may be a little less since she's been on the Lopressor but it's definitely still there. 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 She also has some skipping of her heart but takes her breath away. She would like a definitive diagnosis and to proceed with cardiac catheterization.      Past Medical History:  Diagnosis Date  . Back pain   . Hiatal hernia   . Hyperlipidemia   . Hypertension   . MS (multiple sclerosis) ([place])    2007  . Multiple sclerosis ([place])   . Schatzki's ring     Past Surgical History:  Procedure Laterality Date  . ABDOMINAL HYSTERECTOMY  2005   fibroids  . depression    . ESOPHAGOGASTRODUODENOSCOPY N/A 09/18/2013   Procedure: ESOPHAGOGASTRODUODENOSCOPY (EGD);  Surgeon: Jerene Bears, MD;  Location: [facility];  Service: Gastroenterology;  Laterality: N/A;  . hypertension      Current Medications: Outpatient Medications Prior to Visit  Medication Sig Dispense Refill  . acyclovir  (ZOVIRAX) 400 MG tablet TAKE ONE TABLET BY MOUTH THREE TIMES DAILY 21 tablet 0  . aspirin 81 MG tablet Take 81 mg by mouth daily.    Marland Kitchen atorvastatin (LIPITOR) 80 MG tablet Take 1 tablet (80 mg total) by mouth daily. 90 tablet 1  . FLUoxetine (PROZAC) 20 MG capsule Take 20 mg by mouth daily.    Marland Kitchen FLUoxetine (PROZAC) 40 MG capsule Take 40 mg by mouth daily.    . folic acid-pyridoxine-cyancobalamin (FOLTX) 2.5-25-2 MG TABS tablet Take 1 tablet by mouth daily. 90 each 3  . gabapentin (NEURONTIN) 600 MG tablet Take 1 tablet (600 mg total) by mouth 3 (three) times daily. 270 tablet 3  . HYDROcodone-acetaminophen (NORCO/VICODIN) 5-325 MG per tablet Take 1 tablet by mouth every 6 (six) hours as needed for moderate pain.    . metaxalone (SKELAXIN) 800 MG tablet Take 800 mg by mouth as needed.     . metoprolol tartrate (LOPRESSOR) 25 MG tablet Take 0.5 tablets (12.5 mg total) by mouth 2 (two) times daily. 90 tablet 3  . mometasone (NASONEX) 50 MCG/ACT nasal spray Place 2 sprays into the nose daily. 51 g 1  . Multiple Vitamin (MULTIVITAMIN WITH MINERALS) TABS tablet Take 1 tablet by mouth daily.    Marland Kitchen oxybutynin (DITROPAN-XL) 10 MG 24 hr tablet Take 1 tablet (10 mg total) by mouth at bedtime. 90 tablet 3  . pantoprazole (PROTONIX) 40 MG tablet Take 1 tablet (40 mg  total) by mouth daily. 90 tablet 3  . triamterene-hydrochlorothiazide (MAXZIDE-25) 37.5-25 MG tablet TAKE 1 TABLET BY MOUTH  DAILY 90 tablet 1  . ARIPiprazole (ABILIFY) 5 MG tablet Take 1 tablet (5 mg total) by mouth daily. 90 tablet 2   Facility-Administered Medications Prior to Visit  Medication Dose Route Frequency Provider Last Rate Last Dose  . gadopentetate dimeglumine (MAGNEVIST) injection 20 mL  20 mL Intravenous Once PRN Britt Bottom, MD         Allergies:   Patient has no known allergies.   Social History   Social History  . Marital status: Married    Spouse name: N/A  . Number of children: N/A  . Years of education: N/A    Social History Main Topics  . Smoking status: Never Smoker  . Smokeless tobacco: Never Used  . Alcohol use No  . Drug use: No  . Sexual activity: Yes    Birth control/ protection: Surgical   Other Topics Concern  . None   Social History Narrative  . None     Family History:  The patient's  family history includes Depression in her mother; Hyperlipidemia in her mother; Hypertension in her father and mother.   ROS:   Please see the history of present illness.    Review of Systems  Constitution: Negative.  HENT: Negative.   Eyes: Negative.   Cardiovascular: Positive for chest pain, dyspnea on exertion and irregular heartbeat.  Respiratory: Negative.   Hematologic/Lymphatic: Negative.   Musculoskeletal: Negative.  Negative for joint pain.  Gastrointestinal: Negative.   Genitourinary: Negative.   Neurological: Negative.    All other systems reviewed and are negative.   PHYSICAL EXAM:   VS:  BP 134/78   Pulse 71   Ht 5\' 3"  (1.6 m)   Wt 198 lb (89.8 kg)   SpO2 96%   BMI 35.07 kg/m   Physical Exam  GEN: Well nourished, well developed, in no acute distress  HEENT: normal  Neck: no JVD, carotid bruits, or masses Cardiac:RRR; no murmurs, rubs, or gallops  Respiratory:  clear to auscultation bilaterally, normal work of breathing GI: soft, nontender, nondistended, + BS Ext: without cyanosis, clubbing, or edema, Good distal pulses bilaterally MS: no deformity or atrophy  Skin: warm and dry, no rash Neuro:  Alert and Oriented x 3, Strength and sensation are intact Psych: euthymic mood, full affect  Wt Readings from Last 3 Encounters:  04/29/16 198 lb (89.8 kg)  04/13/16 195 lb (88.5 kg)  03/10/16 192 lb (87.1 kg)      Studies/Labs Reviewed:   EKG:  EKG is not ordered today.    Recent Labs: 02/20/2016: ALT 20; BUN 14; Creat 0.87; Hemoglobin 12.6; Platelets 333; Potassium 3.6; Sodium 141; TSH 4.02   Lipid Panel    Component Value Date/Time   CHOL 217 (H)  02/20/2016 0841   TRIG 186 (H) 02/20/2016 0841   HDL 35 (L) 02/20/2016 0841   CHOLHDL 6.2 (H) 02/20/2016 0841   VLDL 37 (H) 02/20/2016 0841   LDLCALC 145 (H) 02/20/2016 0841    Additional studies/ records that were reviewed today include:  Lexi scan 04/16/16   No diagnostic ST segment changes to indicate ischemia.  Small, mild intensity, partially reversible apical inferolateral defect suggesting either variable breast attenuation or small region of ischemia.  Nuclear stress EF: 67%.  This is a low risk study.     ASSESSMENT:    1. Chest pain, unspecified type   2. Essential  hypertension   3. Hyperlipidemia LDL goal <100      PLAN:  In order of problems listed above:  Chest pain with small mild intensity partially reversible apical inferior lateral defect on Lexi scan that could either be breast attenuation or small region of ischemia. Still having symptoms on low-dose metoprolol but improved some. Dr. branch recommended cardiac catheterization if symptoms continued. Patient would like to proceed for definitive diagnosis. I have reviewed the risks, indications, and alternatives to angioplasty and stenting with the patient. Risks include but are not limited to bleeding, infection, vascular injury, stroke, myocardial infection, arrhythmia, kidney injury, radiation-related injury in the case of prolonged fluoroscopy use, emergency cardiac surgery, and death. The patient understands the risks of serious complication is low (123456) and he agrees to proceed.   Essential hypertension controlled  Hyperlipidemia on Lipitor 80 mg daily.       Medication Adjustments/Labs and Tests Ordered: Current medicines are reviewed at length with the patient today.  Concerns regarding medicines are outlined above.  Medication changes, Labs and Tests ordered today are listed in the Patient Instructions below. There are no Patient Instructions on file for this visit.   Signed, Ermalinda Barrios,  PA-C  04/29/2016 2:35 PM    [facility] [address], [place], [place]  [address] Phone: [phone]; Fax: [phone]

## 2016-04-30 ENCOUNTER — Ambulatory Visit (AMBULATORY_SURGERY_CENTER): Payer: Self-pay | Admitting: *Deleted

## 2016-04-30 ENCOUNTER — Telehealth: Payer: Self-pay | Admitting: *Deleted

## 2016-04-30 VITALS — Ht 63.0 in | Wt 200.2 lb

## 2016-04-30 DIAGNOSIS — Z1211 Encounter for screening for malignant neoplasm of colon: Secondary | ICD-10-CM

## 2016-04-30 MED ORDER — NA SULFATE-K SULFATE-MG SULF 17.5-3.13-1.6 GM/177ML PO SOLN: ORAL | 0 refills | Status: DC

## 2016-04-30 NOTE — Progress Notes (Signed)
See T.E. From today  No egg or soy allergy  No anesthesia or intubation problems per pt  No diet medications taken  Registered in [place]  No home oxygen used  $15 off Suprep coupon given

## 2016-04-30 NOTE — Telephone Encounter (Signed)
noted 

## 2016-04-30 NOTE — Telephone Encounter (Signed)
Dr. Norman Herrlich,  This pt is here for her PV.  I know that there has been communication between yourself and Dr. Harl Bowie about her cardiac hx.  Since then, yesterday, she did see Dr. Harl Bowie for some chest pain.  She is having a cardiac catherization on 05-04-16.  Her colonoscopy in the 16th.  Are you to keep this scheduled (and check on her cardiac results) or would you like to reschedule it?  Thanks, [employer]

## 2016-04-30 NOTE — Telephone Encounter (Signed)
Thanks for the notification I will ask Dottie to send herself a staff message to check the results of the cardiac cath to ensure it is still okay to proceed with colonoscopy as scheduled If no coronary interventions made at cath, then we can proceed as scheduled Thanks JMP  Dottie, see above

## 2016-05-01 ENCOUNTER — Other Ambulatory Visit: Payer: Self-pay | Admitting: Urology

## 2016-05-01 DIAGNOSIS — R3121 Asymptomatic microscopic hematuria: Secondary | ICD-10-CM

## 2016-05-04 ENCOUNTER — Encounter (HOSPITAL_COMMUNITY)
Admission: RE | Disposition: A | Discharge status: Home / Self Care | Payer: Self-pay | Source: Ambulatory Visit | Attending: Interventional Cardiology

## 2016-05-04 ENCOUNTER — Ambulatory Visit (HOSPITAL_COMMUNITY)
Admission: RE | Admit: 2016-05-04 | Discharge: 2016-05-04 | Disposition: A | Discharge status: Home / Self Care | Payer: BLUE CROSS/BLUE SHIELD | Source: Ambulatory Visit | Attending: Interventional Cardiology | Admitting: Interventional Cardiology

## 2016-05-04 ENCOUNTER — Encounter (HOSPITAL_COMMUNITY): Payer: Self-pay | Admitting: Interventional Cardiology

## 2016-05-04 DIAGNOSIS — I10 Essential (primary) hypertension: Secondary | ICD-10-CM | POA: Diagnosis not present

## 2016-05-04 DIAGNOSIS — R0609 Other forms of dyspnea: Secondary | ICD-10-CM | POA: Diagnosis not present

## 2016-05-04 DIAGNOSIS — Z7982 Long term (current) use of aspirin: Secondary | ICD-10-CM | POA: Diagnosis not present

## 2016-05-04 DIAGNOSIS — R9439 Abnormal result of other cardiovascular function study: Secondary | ICD-10-CM | POA: Diagnosis not present

## 2016-05-04 DIAGNOSIS — Z8249 Family history of ischemic heart disease and other diseases of the circulatory system: Secondary | ICD-10-CM | POA: Diagnosis not present

## 2016-05-04 DIAGNOSIS — R079 Chest pain, unspecified: Secondary | ICD-10-CM | POA: Diagnosis not present

## 2016-05-04 DIAGNOSIS — G35 Multiple sclerosis: Secondary | ICD-10-CM | POA: Diagnosis not present

## 2016-05-04 DIAGNOSIS — E785 Hyperlipidemia, unspecified: Secondary | ICD-10-CM | POA: Insufficient documentation

## 2016-05-04 HISTORY — PX: CARDIAC CATHETERIZATION: SHX172

## 2016-05-04 SURGERY — LEFT HEART CATH AND CORONARY ANGIOGRAPHY
Anesthesia: LOCAL

## 2016-05-04 MED ORDER — SODIUM CHLORIDE 0.9% FLUSH: 3.0000 mL | Freq: Two times a day (BID) | INTRAVENOUS | Status: DC

## 2016-05-04 MED ORDER — FENTANYL CITRATE (PF) 100 MCG/2ML IJ SOLN
INTRAMUSCULAR | Status: AC
  Filled 2016-05-04: qty 2

## 2016-05-04 MED ORDER — LIDOCAINE HCL (PF) 1 % IJ SOLN
INTRAMUSCULAR | Status: DC | PRN
  Administered 2016-05-04: 2 mL

## 2016-05-04 MED ORDER — FENTANYL CITRATE (PF) 100 MCG/2ML IJ SOLN
INTRAMUSCULAR | Status: DC | PRN
  Administered 2016-05-04: 25 ug via INTRAVENOUS
  Administered 2016-05-04: 50 ug via INTRAVENOUS

## 2016-05-04 MED ORDER — HEPARIN (PORCINE) IN NACL 2-0.9 UNIT/ML-% IJ SOLN
INTRAMUSCULAR | Status: DC | PRN
  Administered 2016-05-04: 1000 mL

## 2016-05-04 MED ORDER — SODIUM CHLORIDE 0.9 % IV SOLN: 250.0000 mL | INTRAVENOUS | Status: DC | PRN

## 2016-05-04 MED ORDER — LIDOCAINE HCL (PF) 1 % IJ SOLN
INTRAMUSCULAR | Status: AC
  Filled 2016-05-04: qty 30

## 2016-05-04 MED ORDER — SODIUM CHLORIDE 0.9% FLUSH: 3.0000 mL | INTRAVENOUS | Status: DC | PRN

## 2016-05-04 MED ORDER — VERAPAMIL HCL 2.5 MG/ML IV SOLN
INTRAVENOUS | Status: AC
  Filled 2016-05-04: qty 2

## 2016-05-04 MED ORDER — MIDAZOLAM HCL 2 MG/2ML IJ SOLN
INTRAMUSCULAR | Status: AC
  Filled 2016-05-04: qty 2

## 2016-05-04 MED ORDER — IOPAMIDOL (ISOVUE-370) INJECTION 76%
INTRAVENOUS | Status: DC | PRN
Start: 2016-05-04 — End: 2016-05-04
  Administered 2016-05-04: 40 mL via INTRA_ARTERIAL

## 2016-05-04 MED ORDER — MIDAZOLAM HCL 2 MG/2ML IJ SOLN
INTRAMUSCULAR | Status: DC | PRN
  Administered 2016-05-04: 1 mg via INTRAVENOUS
  Administered 2016-05-04: 2 mg via INTRAVENOUS

## 2016-05-04 MED ORDER — HEPARIN (PORCINE) IN NACL 2-0.9 UNIT/ML-% IJ SOLN
INTRAMUSCULAR | Status: AC
  Filled 2016-05-04: qty 1000

## 2016-05-04 MED ORDER — SODIUM CHLORIDE 0.9 % IV SOLN: INTRAVENOUS | Status: DC

## 2016-05-04 MED ORDER — ASPIRIN 81 MG PO CHEW: 81.0000 mg | CHEWABLE_TABLET | ORAL | Status: DC

## 2016-05-04 MED ORDER — HEPARIN SODIUM (PORCINE) 1000 UNIT/ML IJ SOLN
INTRAMUSCULAR | Status: DC | PRN
  Administered 2016-05-04: 4500 [IU] via INTRAVENOUS

## 2016-05-04 MED ORDER — HEPARIN (PORCINE) IN NACL 2-0.9 UNIT/ML-% IJ SOLN
INTRAMUSCULAR | Status: DC | PRN
  Administered 2016-05-04: 10 mL via INTRA_ARTERIAL

## 2016-05-04 MED ORDER — IOPAMIDOL (ISOVUE-370) INJECTION 76%
INTRAVENOUS | Status: AC
  Filled 2016-05-04: qty 100

## 2016-05-04 MED ORDER — SODIUM CHLORIDE 0.9 % WEIGHT BASED INFUSION: 1.0000 mL/kg/h | INTRAVENOUS | Status: DC

## 2016-05-04 MED ORDER — SODIUM CHLORIDE 0.9 % WEIGHT BASED INFUSION
3.0000 mL/kg/h | INTRAVENOUS | Status: AC
  Administered 2016-05-04: 3 mL/kg/h via INTRAVENOUS

## 2016-05-04 SURGICAL SUPPLY — 12 items
CATH EXPO 5FR ANG PIGTAIL 145 (CATHETERS) ×1 IMPLANT
CATH EXPO 5FR FR4 (CATHETERS) ×1 IMPLANT
CATH INFINITI 5 FR JL3.5 (CATHETERS) ×1 IMPLANT
DEVICE RAD COMP TR BAND LRG (VASCULAR PRODUCTS) ×1 IMPLANT
GLIDESHEATH SLEND SS 6F .021 (SHEATH) ×1 IMPLANT
GUIDEWIRE INQWIRE 1.5J.035X260 (WIRE) IMPLANT
INQWIRE 1.5J .035X260CM (WIRE) ×2
KIT HEART LEFT (KITS) ×2 IMPLANT
PACK CARDIAC CATHETERIZATION (CUSTOM PROCEDURE TRAY) ×2 IMPLANT
TRANSDUCER W/STOPCOCK (MISCELLANEOUS) ×2 IMPLANT
TUBING CIL FLEX 10 FLL-RA (TUBING) ×2 IMPLANT
WIRE HI TORQ VERSACORE-J 145CM (WIRE) ×1 IMPLANT

## 2016-05-04 NOTE — Interval H&P Note (Signed)
Cath Lab Visit (complete for each Cath Lab visit)  Clinical Evaluation Leading to the Procedure:   ACS: No.  Non-ACS:    Anginal Classification: CCS III  Anti-ischemic medical therapy: Minimal Therapy (1 class of medications)  Non-Invasive Test Results: Intermediate-risk stress test findings: cardiac mortality 1-3%/year  Prior CABG: No previous CABG      History and Physical Interval Note:  05/04/2016 7:36 AM  Pamela Walters  has presented today for surgery, with the diagnosis of cp, abnormal myoview  The various methods of treatment have been discussed with the patient and family. After consideration of risks, benefits and other options for treatment, the patient has consented to  Procedure(s): Left Heart Cath and Coronary Angiography (N/A) as a surgical intervention .  The patient's history has been reviewed, patient examined, no change in status, stable for surgery.  I have reviewed the patient's chart and labs.  Questions were answered to the patient's satisfaction.     [name]

## 2016-05-04 NOTE — H&P (View-Only) (Signed)
Cardiology Office Note    Date:  04/29/2016   ID:  Pamela Walters, DOB [birth year], MRN [id number]  PCP:  Pamela Nakayama, MD  Cardiologist: Dr. Harl Bowie   Chief Complaint  Patient presents with  . Follow-up    History of Present Illness:  Pamela Walters is a 56 y.o. female who was seen initially by Dr. Harl Bowie 04/13/16 for 6 months of chest pain and dyspnea on exertion. CAD risk factors include HLD, hypertension and father with heart trouble in his 47s. Pamela Walters 04/16/16 showed a small mild intensity partially reversible apical inferior lateral defect suggesting either breast attenuation are small region of ischemia. LVEF 67%. It was a low risk study. Dr. Harl Bowie added Lopressor 12.5 mg twice a day with follow-up today. Dr. Harl Bowie who recommended If her symptoms continue consider cardiac catheterization.  Patient comes in today for follow-up. She continues to have chest pain and dyspnea on exertion. She says it may be a little less since she's been on the Lopressor but it's definitely still there. 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 She also has some skipping of her heart but takes her breath away. She would like a definitive diagnosis and to proceed with cardiac catheterization.      Past Medical History:  Diagnosis Date  . Back pain   . Hiatal hernia   . Hyperlipidemia   . Hypertension   . MS (multiple sclerosis) ([place])    2007  . Multiple sclerosis ([place])   . Schatzki's ring     Past Surgical History:  Procedure Laterality Date  . ABDOMINAL HYSTERECTOMY  2005   fibroids  . depression    . ESOPHAGOGASTRODUODENOSCOPY N/A 09/18/2013   Procedure: ESOPHAGOGASTRODUODENOSCOPY (EGD);  Surgeon: Jerene Bears, MD;  Location: [facility];  Service: Gastroenterology;  Laterality: N/A;  . hypertension      Current Medications: Outpatient Medications Prior to Visit  Medication Sig Dispense Refill  . acyclovir  (ZOVIRAX) 400 MG tablet TAKE ONE TABLET BY MOUTH THREE TIMES DAILY 21 tablet 0  . aspirin 81 MG tablet Take 81 mg by mouth daily.    Marland Kitchen atorvastatin (LIPITOR) 80 MG tablet Take 1 tablet (80 mg total) by mouth daily. 90 tablet 1  . FLUoxetine (PROZAC) 20 MG capsule Take 20 mg by mouth daily.    Marland Kitchen FLUoxetine (PROZAC) 40 MG capsule Take 40 mg by mouth daily.    . folic acid-pyridoxine-cyancobalamin (FOLTX) 2.5-25-2 MG TABS tablet Take 1 tablet by mouth daily. 90 each 3  . gabapentin (NEURONTIN) 600 MG tablet Take 1 tablet (600 mg total) by mouth 3 (three) times daily. 270 tablet 3  . HYDROcodone-acetaminophen (NORCO/VICODIN) 5-325 MG per tablet Take 1 tablet by mouth every 6 (six) hours as needed for moderate pain.    . metaxalone (SKELAXIN) 800 MG tablet Take 800 mg by mouth as needed.     . metoprolol tartrate (LOPRESSOR) 25 MG tablet Take 0.5 tablets (12.5 mg total) by mouth 2 (two) times daily. 90 tablet 3  . mometasone (NASONEX) 50 MCG/ACT nasal spray Place 2 sprays into the nose daily. 51 g 1  . Multiple Vitamin (MULTIVITAMIN WITH MINERALS) TABS tablet Take 1 tablet by mouth daily.    Marland Kitchen oxybutynin (DITROPAN-XL) 10 MG 24 hr tablet Take 1 tablet (10 mg total) by mouth at bedtime. 90 tablet 3  . pantoprazole (PROTONIX) 40 MG tablet Take 1 tablet (40 mg  total) by mouth daily. 90 tablet 3  . triamterene-hydrochlorothiazide (MAXZIDE-25) 37.5-25 MG tablet TAKE 1 TABLET BY MOUTH  DAILY 90 tablet 1  . ARIPiprazole (ABILIFY) 5 MG tablet Take 1 tablet (5 mg total) by mouth daily. 90 tablet 2   Facility-Administered Medications Prior to Visit  Medication Dose Route Frequency Provider Last Rate Last Dose  . gadopentetate dimeglumine (MAGNEVIST) injection 20 mL  20 mL Intravenous Once PRN Britt Bottom, MD         Allergies:   Patient has no known allergies.   Social History   Social History  . Marital status: Married    Spouse name: N/A  . Number of children: N/A  . Years of education: N/A    Social History Main Topics  . Smoking status: Never Smoker  . Smokeless tobacco: Never Used  . Alcohol use No  . Drug use: No  . Sexual activity: Yes    Birth control/ protection: Surgical   Other Topics Concern  . None   Social History Narrative  . None     Family History:  The patient's  family history includes Depression in her mother; Hyperlipidemia in her mother; Hypertension in her father and mother.   ROS:   Please see the history of present illness.    Review of Systems  Constitution: Negative.  HENT: Negative.   Eyes: Negative.   Cardiovascular: Positive for chest pain, dyspnea on exertion and irregular heartbeat.  Respiratory: Negative.   Hematologic/Lymphatic: Negative.   Musculoskeletal: Negative.  Negative for joint pain.  Gastrointestinal: Negative.   Genitourinary: Negative.   Neurological: Negative.    All other systems reviewed and are negative.   PHYSICAL EXAM:   VS:  BP 134/78   Pulse 71   Ht 5\' 3"  (1.6 m)   Wt 198 lb (89.8 kg)   SpO2 96%   BMI 35.07 kg/m   Physical Exam  GEN: Well nourished, well developed, in no acute distress  HEENT: normal  Neck: no JVD, carotid bruits, or masses Cardiac:RRR; no murmurs, rubs, or gallops  Respiratory:  clear to auscultation bilaterally, normal work of breathing GI: soft, nontender, nondistended, + BS Ext: without cyanosis, clubbing, or edema, Good distal pulses bilaterally MS: no deformity or atrophy  Skin: warm and dry, no rash Neuro:  Alert and Oriented x 3, Strength and sensation are intact Psych: euthymic mood, full affect  Wt Readings from Last 3 Encounters:  04/29/16 198 lb (89.8 kg)  04/13/16 195 lb (88.5 kg)  03/10/16 192 lb (87.1 kg)      Studies/Labs Reviewed:   EKG:  EKG is not ordered today.    Recent Labs: 02/20/2016: ALT 20; BUN 14; Creat 0.87; Hemoglobin 12.6; Platelets 333; Potassium 3.6; Sodium 141; TSH 4.02   Lipid Panel    Component Value Date/Time   CHOL 217 (H)  02/20/2016 0841   TRIG 186 (H) 02/20/2016 0841   HDL 35 (L) 02/20/2016 0841   CHOLHDL 6.2 (H) 02/20/2016 0841   VLDL 37 (H) 02/20/2016 0841   LDLCALC 145 (H) 02/20/2016 0841    Additional studies/ records that were reviewed today include:  Pamela Walters 04/16/16   No diagnostic ST segment changes to indicate ischemia.  Small, mild intensity, partially reversible apical inferolateral defect suggesting either variable breast attenuation or small region of ischemia.  Nuclear stress EF: 67%.  This is a low risk study.     ASSESSMENT:    1. Chest pain, unspecified type   2. Essential  hypertension   3. Hyperlipidemia LDL goal <100      PLAN:  In order of problems listed above:  Chest pain with small mild intensity partially reversible apical inferior lateral defect on Pamela Walters that could either be breast attenuation or small region of ischemia. Still having symptoms on low-dose metoprolol but improved some. Dr. branch recommended cardiac catheterization if symptoms continued. Patient would like to proceed for definitive diagnosis. I have reviewed the risks, indications, and alternatives to angioplasty and stenting with the patient. Risks include but are not limited to bleeding, infection, vascular injury, stroke, myocardial infection, arrhythmia, kidney injury, radiation-related injury in the case of prolonged fluoroscopy use, emergency cardiac surgery, and death. The patient understands the risks of serious complication is low (123456) and he agrees to proceed.   Essential hypertension controlled  Hyperlipidemia on Lipitor 80 mg daily.       Medication Adjustments/Labs and Tests Ordered: Current medicines are reviewed at length with the patient today.  Concerns regarding medicines are outlined above.  Medication changes, Labs and Tests ordered today are listed in the Patient Instructions below. There are no Patient Instructions on file for this visit.   Signed, Ermalinda Barrios,  PA-C  04/29/2016 2:35 PM    [facility] [place], [place], [place]  [address] Phone: [phone]; Fax: [phone]

## 2016-05-04 NOTE — Discharge Instructions (Signed)

## 2016-05-05 NOTE — Telephone Encounter (Signed)
Patient's cardiac cath is normal. Will send message to Dr. Hilarie Fredrickson to let him know and will patient.

## 2016-05-06 ENCOUNTER — Encounter: Payer: Self-pay | Admitting: Internal Medicine

## 2016-05-06 NOTE — Telephone Encounter (Signed)
I have spoken to patient to advise that at this time we plan to proceed with colonoscopy since her cardiac cath came back normal. Advised that unless we hear otherwise from anesthesia, we are able to keep appt as planned. She verbalizes understanding.

## 2016-05-08 ENCOUNTER — Telehealth: Payer: Self-pay | Admitting: Internal Medicine

## 2016-05-08 NOTE — Telephone Encounter (Signed)
No charge. 

## 2016-05-12 ENCOUNTER — Encounter: Payer: Self-pay | Admitting: Internal Medicine

## 2016-05-15 ENCOUNTER — Ambulatory Visit (HOSPITAL_COMMUNITY): Payer: BLUE CROSS/BLUE SHIELD

## 2016-05-24 ENCOUNTER — Encounter (HOSPITAL_COMMUNITY): Payer: Self-pay

## 2016-05-25 ENCOUNTER — Ambulatory Visit (HOSPITAL_COMMUNITY)
Admission: RE | Admit: 2016-05-25 | Discharge: 2016-05-25 | Disposition: A | Discharge status: Home / Self Care | Payer: BLUE CROSS/BLUE SHIELD | Source: Ambulatory Visit | Attending: Urology | Admitting: Urology

## 2016-05-25 ENCOUNTER — Ambulatory Visit (INDEPENDENT_AMBULATORY_CARE_PROVIDER_SITE_OTHER): Payer: BLUE CROSS/BLUE SHIELD | Admitting: Cardiology

## 2016-05-25 ENCOUNTER — Encounter: Payer: Self-pay | Admitting: Cardiology

## 2016-05-25 VITALS — BP 137/79 | HR 63 | Ht 63.0 in | Wt 195.0 lb

## 2016-05-25 DIAGNOSIS — K760 Fatty (change of) liver, not elsewhere classified: Secondary | ICD-10-CM | POA: Insufficient documentation

## 2016-05-25 DIAGNOSIS — I700 Atherosclerosis of aorta: Secondary | ICD-10-CM | POA: Insufficient documentation

## 2016-05-25 DIAGNOSIS — R3121 Asymptomatic microscopic hematuria: Secondary | ICD-10-CM | POA: Diagnosis not present

## 2016-05-25 DIAGNOSIS — N200 Calculus of kidney: Secondary | ICD-10-CM | POA: Diagnosis not present

## 2016-05-25 DIAGNOSIS — R0789 Other chest pain: Secondary | ICD-10-CM

## 2016-05-25 DIAGNOSIS — R3129 Other microscopic hematuria: Secondary | ICD-10-CM | POA: Diagnosis not present

## 2016-05-25 MED ORDER — IOPAMIDOL (ISOVUE-300) INJECTION 61%
125.0000 mL | Freq: Once | INTRAVENOUS | Status: AC | PRN
  Administered 2016-05-25: 125 mL via INTRAVENOUS

## 2016-05-25 MED ORDER — ISOSORBIDE MONONITRATE ER 30 MG PO TB24: 15.0000 mg | ORAL_TABLET | Freq: Every day | ORAL | 3 refills | Status: DC

## 2016-05-25 NOTE — Progress Notes (Signed)
Clinical Summary Pamela Walters is a 56 y.o.female seen today for follow up of the following medical problems .  1. Chest pain - on and off for 6 months. Pressure like pain midchest. Dull pain that is constant, that can flare at times. Can be worst with exertion. 5/10 in severity. Can get dizzy with episodes. Varies in duration.  - DOE which is new. Example at [facility] parks recently, noticed significant DOE while walking.  - no LE edema.  CAD risk factors: HL, HTN, father heart troubles in 95s.   03/2016 nuclear stress small inapical inferolateral defect, variable breat attenuation vs mild ischemia - Jan 2018 cath no significant CAD - still with chest pain at times. - somewhat better with metoprolol   Past Medical History:  Diagnosis Date  . Allergy   . Back pain   . Depression   . GERD (gastroesophageal reflux disease)   . Hiatal hernia   . Hyperlipidemia   . Hypertension   . MS (multiple sclerosis) ([place])    2007  . Multiple sclerosis ([place])   . Schatzki's ring   . Sleep apnea    wears CPAP     No Known Allergies   Current Outpatient Prescriptions  Medication Sig Dispense Refill  . aspirin 81 MG tablet Take 81 mg by mouth daily.    Marland Kitchen atorvastatin (LIPITOR) 80 MG tablet Take 1 tablet (80 mg total) by mouth daily. (Patient taking differently: Take 80 mg by mouth every evening. ) 90 tablet 1  . FLUoxetine (PROZAC) 20 MG capsule Take 20 mg by mouth daily.    Marland Kitchen FLUoxetine (PROZAC) 40 MG capsule Take 40 mg by mouth daily.    . folic acid-pyridoxine-cyancobalamin (FOLTX) 2.5-25-2 MG TABS tablet Take 1 tablet by mouth daily. 90 each 3  . gabapentin (NEURONTIN) 600 MG tablet Take 1 tablet (600 mg total) by mouth 3 (three) times daily. 270 tablet 3  . HYDROcodone-acetaminophen (NORCO/VICODIN) 5-325 MG per tablet Take 1 tablet by mouth every 6 (six) hours as needed for moderate pain.    Marland Kitchen ibuprofen (ADVIL,MOTRIN) 200 MG tablet Take 800 mg by mouth every 6 (six) hours as  needed for headache or moderate pain.    . metaxalone (SKELAXIN) 800 MG tablet Take 800 mg by mouth 2 (two) times daily as needed for muscle spasms.     . metoprolol tartrate (LOPRESSOR) 25 MG tablet Take 0.5 tablets (12.5 mg total) by mouth 2 (two) times daily. 90 tablet 3  . metroNIDAZOLE (METROGEL) 0.75 % gel Apply 1 application topically 2 (two) times daily.    . mometasone (NASONEX) 50 MCG/ACT nasal spray Place 2 sprays into the nose daily. 51 g 1  . Multiple Vitamin (MULTIVITAMIN WITH MINERALS) TABS tablet Take 1 tablet by mouth daily.    . Na Sulfate-K Sulfate-Mg Sulf (SUPREP BOWEL PREP KIT) 17.5-3.13-1.6 GM/180ML SOLN Suprep as directed, no substitutions 354 mL 0  . oxybutynin (DITROPAN-XL) 10 MG 24 hr tablet Take 1 tablet (10 mg total) by mouth at bedtime. 90 tablet 3  . pantoprazole (PROTONIX) 40 MG tablet Take 1 tablet (40 mg total) by mouth daily. 90 tablet 3  . triamterene-hydrochlorothiazide (MAXZIDE-25) 37.5-25 MG tablet TAKE 1 TABLET BY MOUTH  DAILY 90 tablet 1   No current facility-administered medications for this visit.    Facility-Administered Medications Ordered in Other Visits  Medication Dose Route Frequency Provider Last Rate Last Dose  . gadopentetate dimeglumine (MAGNEVIST) injection 20 mL  20 mL Intravenous Once  PRN Britt Bottom, MD         Past Surgical History:  Procedure Laterality Date  . ABDOMINAL HYSTERECTOMY  2005   fibroids  . CARDIAC CATHETERIZATION N/A 05/04/2016   Procedure: Left Heart Cath and Coronary Angiography;  Surgeon: Jettie Booze, MD;  Location: [place] [facility] [facility];  Service: Cardiovascular;  Laterality: N/A;  . ESOPHAGOGASTRODUODENOSCOPY N/A 09/18/2013   Procedure: ESOPHAGOGASTRODUODENOSCOPY (EGD);  Surgeon: Jerene Bears, MD;  Location: [place];  Service: Gastroenterology;  Laterality: N/A;  . UPPER GASTROINTESTINAL ENDOSCOPY       No Known Allergies    Family History  Problem Relation Age of Onset  . Hypertension Mother    . Hyperlipidemia Mother   . Depression Mother   . Hypertension Father   . Colon cancer Neg Hx   . Esophageal cancer Neg Hx   . Rectal cancer Neg Hx   . Stomach cancer Neg Hx      Social History Ms. Diop reports that she has never smoked. She has never used smokeless tobacco. Ms. Viti reports that she does not drink alcohol.   Review of Systems CONSTITUTIONAL: No weight loss, fever, chills, weakness or fatigue.  HEENT: Eyes: No visual loss, blurred vision, double vision or yellow sclerae.No hearing loss, sneezing, congestion, runny nose or sore throat.  SKIN: No rash or itching.  CARDIOVASCULAR: per HPI RESPIRATORY: No shortness of breath, cough or sputum.  GASTROINTESTINAL: No anorexia, nausea, vomiting or diarrhea. No abdominal pain or blood.  GENITOURINARY: No burning on urination, no polyuria NEUROLOGICAL: No headache, dizziness, syncope, paralysis, ataxia, numbness or tingling in the extremities. No change in bowel or bladder control.  MUSCULOSKELETAL: No muscle, back pain, joint pain or stiffness.  LYMPHATICS: No enlarged nodes. No history of splenectomy.  PSYCHIATRIC: No history of depression or anxiety.  ENDOCRINOLOGIC: No reports of sweating, cold or heat intolerance. No polyuria or polydipsia.  Marland Kitchen   Physical Examination Vitals:   05/25/16 1055  BP: 137/79  Pulse: 63   Vitals:   05/25/16 1055  Weight: 195 lb (88.5 kg)  Height: '5\' 3"'  (1.6 m)    Gen: resting comfortably, no acute distress HEENT: no scleral icterus, pupils equal round and reactive, no palptable cervical adenopathy,  CV: RRR, no m/r/g, no jvd Resp: Clear to auscultation bilaterally GI: abdomen is soft, non-tender, non-distended, normal bowel sounds, no hepatosplenomegaly MSK: extremities are warm, no edema.  Skin: warm, no rash Neuro:  no focal deficits Psych: appropriate affect   Diagnostic Studies 03/2016 nuclear stress  No diagnostic ST segment changes to indicate  ischemia.  Small, mild intensity, partially reversible apical inferolateral defect suggesting either variable breast attenuation or small region of ischemia.  Nuclear stress EF: 67%.  This is a low risk study.   05/04/16 cath  The left ventricular systolic function is normal.  LV end diastolic pressure is normal.  The left ventricular ejection fraction is 55-65% by visual estimate.  There is no aortic valve stenosis.  No significant CAD.   Continue preventive therapy.  Assessment and Plan  1. Chest pain -negative workup for CAD including nuclear stress test and recent cath - trial of nitrates for possible coronary vasospasm, otherwise no additional cardiac interventions planned at this time. F/u with pcp to evaluate noncardiac causes of chest pain.     F/u 4 months   Arnoldo Lenis, M.D

## 2016-05-25 NOTE — Patient Instructions (Signed)
Your physician wants you to follow-up in: [place] DR. BRANCH  You will receive a reminder letter in the mail two months in advance. If you don't receive a letter, please call our office to schedule the follow-up appointment.  Your physician has recommended you make the following change in your medication:   START ISOSORBIDE 15 MG DAILY  Thank you for choosing [facility]!!

## 2016-06-03 ENCOUNTER — Ambulatory Visit (AMBULATORY_SURGERY_CENTER): Payer: BLUE CROSS/BLUE SHIELD | Admitting: Internal Medicine

## 2016-06-03 ENCOUNTER — Encounter: Payer: Self-pay | Admitting: Internal Medicine

## 2016-06-03 VITALS — BP 122/68 | HR 73 | Temp 96.9°F | Resp 16 | Ht 63.0 in | Wt 195.0 lb

## 2016-06-03 DIAGNOSIS — Z1212 Encounter for screening for malignant neoplasm of rectum: Secondary | ICD-10-CM

## 2016-06-03 DIAGNOSIS — Z1211 Encounter for screening for malignant neoplasm of colon: Secondary | ICD-10-CM | POA: Diagnosis present

## 2016-06-03 DIAGNOSIS — D123 Benign neoplasm of transverse colon: Secondary | ICD-10-CM | POA: Diagnosis not present

## 2016-06-03 DIAGNOSIS — D126 Benign neoplasm of colon, unspecified: Secondary | ICD-10-CM | POA: Diagnosis not present

## 2016-06-03 DIAGNOSIS — D125 Benign neoplasm of sigmoid colon: Secondary | ICD-10-CM | POA: Diagnosis not present

## 2016-06-03 DIAGNOSIS — D124 Benign neoplasm of descending colon: Secondary | ICD-10-CM

## 2016-06-03 DIAGNOSIS — K635 Polyp of colon: Secondary | ICD-10-CM

## 2016-06-03 DIAGNOSIS — K219 Gastro-esophageal reflux disease without esophagitis: Secondary | ICD-10-CM

## 2016-06-03 MED ORDER — SODIUM CHLORIDE 0.9 % IV SOLN: 500.0000 mL | INTRAVENOUS | Status: DC

## 2016-06-03 MED ORDER — PANTOPRAZOLE SODIUM 40 MG PO TBEC: 40.0000 mg | DELAYED_RELEASE_TABLET | Freq: Two times a day (BID) | ORAL | 5 refills | Status: DC

## 2016-06-03 NOTE — Op Note (Signed)
[facility] Patient Name: Pamela Walters Procedure Date: 06/03/2016 3:09 PM MRN: [id number] Endoscopist: Jerene Bears , MD Age: 56 Referring MD:  Date of Birth: 09-26-1960 Gender: Female Account #: [id number] Procedure:                Colonoscopy Indications:              Screening for colorectal malignant neoplasm, This                            is the patient's first colonoscopy Medicines:                Monitored Anesthesia Care Procedure:                Pre-Anesthesia Assessment:                           - Prior to the procedure, a History and Physical                            was performed, and patient medications and                            allergies were reviewed. The patient's tolerance of                            previous anesthesia was also reviewed. The risks                            and benefits of the procedure and the sedation                            options and risks were discussed with the patient.                            All questions were answered, and informed consent                            was obtained. Prior Anticoagulants: The patient has                            taken no previous anticoagulant or antiplatelet                            agents. ASA Grade Assessment: II - A patient with                            mild systemic disease. After reviewing the risks                            and benefits, the patient was deemed in                            satisfactory condition to undergo the procedure.  After obtaining informed consent, the colonoscope                            was passed under direct vision. Throughout the                            procedure, the patient's blood pressure, pulse, and                            oxygen saturations were monitored continuously. The                            Model PCF-H190L [id number]) scope was introduced                            through the anus and  advanced to the the cecum,                            identified by appendiceal orifice and ileocecal                            valve. The colonoscopy was performed without                            difficulty. The patient tolerated the procedure                            well. The quality of the bowel preparation was                            good. The ileocecal valve, appendiceal orifice, and                            rectum were photographed. Scope In: 3:15:43 PM Scope Out: 3:34:21 PM Scope Withdrawal Time: 0 hours 15 minutes 26 seconds  Total Procedure Duration: 0 hours 18 minutes 38 seconds  Findings:                 The digital rectal exam was normal.                           Two sessile polyps were found in the transverse                            colon. The polyps were 4 to 6 mm in size. These                            polyps were removed with a cold snare. Resection                            and retrieval were complete.                           A 5 mm polyp was found in the descending colon.  The                            polyp was sessile. The polyp was removed with a                            cold snare. Resection and retrieval were complete.                           Two sessile polyps were found in the sigmoid colon.                            The polyps were 3 to 4 mm in size. These polyps                            were removed with a cold snare. Resection and                            retrieval were complete.                           Internal hemorrhoids were found during                            retroflexion. The hemorrhoids were medium-sized. Complications:            No immediate complications. Estimated Blood Loss:     Estimated blood loss was minimal. Impression:               - Two 4 to 6 mm polyps in the transverse colon,                            removed with a cold snare. Resected and retrieved.                           - One 5 mm polyp in the  descending colon, removed                            with a cold snare. Resected and retrieved.                           - Two 3 to 4 mm polyps in the sigmoid colon,                            removed with a cold snare. Resected and retrieved.                           - Internal hemorrhoids. Recommendation:           - Patient has a contact number available for                            emergencies. The signs and symptoms of potential  delayed complications were discussed with the                            patient. Return to normal activities tomorrow.                            Written discharge instructions were provided to the                            patient.                           - Resume previous diet.                           - Continue present medications.                           - Await pathology results.                           - Repeat colonoscopy is recommended. The                            colonoscopy date will be determined after pathology                            results from today's exam become available for                            review. Jerene Bears, MD 06/03/2016 3:37:45 PM This report has been signed electronically.

## 2016-06-03 NOTE — Progress Notes (Signed)
Per Dr. Hilarie Fredrickson schedule pt for EGD for dysphagia early March.  Randall Hiss, RN set up this appointment.  Per Dr. Hilarie Fredrickson going to change protonix to BID.  Rx was sent to [facility] Protonix 40 mg bid ac #60 refill x5.  Corky Sing

## 2016-06-03 NOTE — Progress Notes (Signed)
No problems noted in the recovery room. maw 

## 2016-06-03 NOTE — Progress Notes (Signed)
Called to room to assist during endoscopic procedure.  Patient ID and intended procedure confirmed with present staff. Received instructions for my participation in the procedure from the performing physician.  

## 2016-06-03 NOTE — Patient Instructions (Signed)
YOU HAD AN ENDOSCOPIC PROCEDURE TODAY AT [facility]:   Refer to the procedure report that was given to you for any specific questions about what was found during the examination.  If the procedure report does not answer your questions, please call your gastroenterologist to clarify.  If you requested that your care partner not be given the details of your procedure findings, then the procedure report has been included in a sealed envelope for you to review at your convenience later.  YOU SHOULD EXPECT: Some feelings of bloating in the abdomen. Passage of more gas than usual.  Walking can help get rid of the air that was put into your GI tract during the procedure and reduce the bloating. If you had a lower endoscopy (such as a colonoscopy or flexible sigmoidoscopy) you may notice spotting of blood in your stool or on the toilet paper. If you underwent a bowel prep for your procedure, you may not have a normal bowel movement for a few days.  Please Note:  You might notice some irritation and congestion in your nose or some drainage.  This is from the oxygen used during your procedure.  There is no need for concern and it should clear up in a day or so.  SYMPTOMS TO REPORT IMMEDIATELY:   Following lower endoscopy (colonoscopy or flexible sigmoidoscopy):  Excessive amounts of blood in the stool  Significant tenderness or worsening of abdominal pains  Swelling of the abdomen that is new, acute  Fever of 100F or higher   For urgent or emergent issues, a gastroenterologist can be reached at any hour by calling [phone].   DIET:  We do recommend a small meal at first, but then you may proceed to your regular diet.  Drink plenty of fluids but you should avoid alcoholic beverages for 24 hours.  ACTIVITY:  You should plan to take it easy for the rest of today and you should NOT DRIVE or use heavy machinery until tomorrow (because of the sedation medicines used during the test).     FOLLOW UP: Our staff will call the number listed on your records the next business day following your procedure to check on you and address any questions or concerns that you may have regarding the information given to you following your procedure. If we do not reach you, we will leave a message.  However, if you are feeling well and you are not experiencing any problems, there is no need to return our call.  We will assume that you have returned to your regular daily activities without incident.  If any biopsies were taken you will be contacted by phone or by letter within the next 1-3 weeks.  Please call us at [phone] if you have not heard about the biopsies in 3 weeks.    SIGNATURES/CONFIDENTIALITY: You and/or your care partner have signed paperwork which will be entered into your electronic medical record.  These signatures attest to the fact that that the information above on your After Visit Summary has been reviewed and is understood.  Full responsibility of the confidentiality of this discharge information lies with you and/or your care-partner.   EGD upper endoscopy scheduled. Rx for protonix 40 mg sent to [facility]. Handouts were given to your care partner on polyps and hemorrhoids. You may resume your current medications today.  Increasing Protonix to twice daily. Await biopsy results. Please call if any questions or concerns.

## 2016-06-04 ENCOUNTER — Telehealth: Payer: Self-pay

## 2016-06-04 ENCOUNTER — Telehealth: Payer: Self-pay | Admitting: *Deleted

## 2016-06-04 NOTE — Telephone Encounter (Signed)
-----   Message from Larina Bras, [place] sent at 06/03/2016  4:55 PM EST ----- Regarding: FW: bandings   ----- Message ----- From: Jerene Bears, MD Sent: 06/03/2016   3:52 PM To: Larina Bras, CMA Subject: bandings                                       Please arrange banding x 3 JMP

## 2016-06-04 NOTE — Telephone Encounter (Signed)
Number identifier. Left voice mail will call back later today.

## 2016-06-04 NOTE — Telephone Encounter (Signed)
Left voicemail for patient to call back. She has been scheduled for her hemorrhoidal bandings.

## 2016-06-04 NOTE — Telephone Encounter (Signed)
  Follow up Call-  Call back number 06/03/2016 04/04/2014 12/18/2013  Post procedure Call Back phone  # [phone]  Permission to leave phone message Yes Yes Yes  Some recent data might be hidden     Patient questions:  Do you have a fever, pain , or abdominal swelling? No. Pain Score  0 *  Have you tolerated food without any problems? Yes.    Have you been able to return to your normal activities? Yes.    Do you have any questions about your discharge instructions: Diet   No. Medications  No. Follow up visit  No.  Do you have questions or concerns about your Care? No.  Actions: * If pain score is 4 or above: No action needed, pain <4.

## 2016-06-05 MED ORDER — PANTOPRAZOLE SODIUM 40 MG PO TBEC
40.0000 mg | DELAYED_RELEASE_TABLET | ORAL | 0 refills | Status: DC
Start: 2016-06-05 — End: 2016-09-04

## 2016-06-05 MED ORDER — RANITIDINE HCL 150 MG PO TABS: 150.0000 mg | ORAL_TABLET | Freq: Every day | ORAL | 0 refills | Status: DC

## 2016-06-05 NOTE — Telephone Encounter (Signed)
I have left another voicemail for patient to call back. In addition, patient's insurance has denied a prior authorization request for pantoprazole twice daily dosing as originally sent. Therefore, Dr Hilarie Fredrickson has recommended pantoprazole 40 mg every morning and Zantac 150 mg every evening. I have sent scripts to the pharmacy to reflect this change and will also speak to patient about this when she returns my call.

## 2016-06-08 ENCOUNTER — Encounter: Payer: Self-pay | Admitting: *Deleted

## 2016-06-08 NOTE — Telephone Encounter (Signed)
I have spoken to patient to patient to to advise of hemorrhoidal banding appointments. She can come for 2 of the appointments but will not be in town for the March appointment. We have cancelled that appointment and instead will make the April appointment her 2nd banding and will schedule banding #3 when she comes for the 2nd banding.  Patient has also been advised that since insurance denied twice daily pantoprazole, Dr Hilarie Fredrickson recommended pantoprazole every morning and zantac every evening. She verbalizes understanding.

## 2016-06-09 ENCOUNTER — Encounter: Payer: Self-pay | Admitting: Internal Medicine

## 2016-06-19 ENCOUNTER — Encounter: Payer: Self-pay | Admitting: Internal Medicine

## 2016-06-19 ENCOUNTER — Ambulatory Visit (INDEPENDENT_AMBULATORY_CARE_PROVIDER_SITE_OTHER): Payer: BLUE CROSS/BLUE SHIELD | Admitting: Internal Medicine

## 2016-06-19 VITALS — BP 150/90 | HR 80 | Ht 63.0 in | Wt 189.2 lb

## 2016-06-19 DIAGNOSIS — K648 Other hemorrhoids: Secondary | ICD-10-CM | POA: Diagnosis not present

## 2016-06-19 NOTE — Progress Notes (Signed)
Pamela Walters is a 56 year old female who underwent recent colonoscopy who presents for consideration of hemorrhoidal banding for chronic internal hemorrhoid symptoms  Symptoms consist of intermittent rectal bleeding associated with defecation. Bleeding is painless. Some anal burning and irritation along with prolapse after defecation. She denies constipation though she does not drink enough water stools can be harder. Symptoms have been present for years since her last pregnancy No fecal smearing or perianal itching   PROCEDURE NOTE:  The patient presents with symptomatic grade 2 internal hemorrhoids, requesting rubber band ligation of her hemorrhoidal disease.  All risks, benefits and alternative forms of therapy were described and informed consent was obtained.   The anorectum was pre-medicated with 0.125% nitroglycerin ointment The decision was made to band the LL internal hemorrhoid, and the Magnolia was used to perform band ligation without complication.   Digital anorectal examination was then performed to assure proper positioning of the band, and to adjust the banded tissue as required.  The patient was discharged home without pain or other issues.  Dietary and behavioral recommendations were given and along with follow-up instructions.     The patient will return as scheduled for follow-up and possible additional banding as required. No complications were encountered and the patient tolerated the procedure well.

## 2016-06-19 NOTE — Patient Instructions (Signed)
You have been scheduled for your 2nd banding on 08/11/16 @ 3:45 pm.  HEMORRHOID BANDING PROCEDURE    FOLLOW-UP CARE   1. The procedure you have had should have been relatively painless since the banding of the area involved does not have nerve endings and there is no pain sensation.  The rubber band cuts off the blood supply to the hemorrhoid and the band may fall off as soon as 48 hours after the banding (the band may occasionally be seen in the toilet bowl following a bowel movement). You may notice a temporary feeling of fullness in the rectum which should respond adequately to plain Tylenol or Motrin.  2. Following the banding, avoid strenuous exercise that evening and resume full activity the next day.  A sitz bath (soaking in a warm tub) or bidet is soothing, and can be useful for cleansing the area after bowel movements.     3. To avoid constipation, take two tablespoons of natural wheat bran, natural oat bran, flax, Benefiber or any over the counter fiber supplement and increase your water intake to 7-8 glasses daily.    4. Unless you have been prescribed anorectal medication, do not put anything inside your rectum for two weeks: No suppositories, enemas, fingers, etc.  5. Occasionally, you may have more bleeding than usual after the banding procedure.  This is often from the untreated hemorrhoids rather than the treated one.  Don't be concerned if there is a tablespoon or so of blood.  If there is more blood than this, lie flat with your bottom higher than your head and apply an ice pack to the area. If the bleeding does not stop within a half an hour or if you feel faint, call our office at [phone] or go to the emergency room.  6. Problems are not common; however, if there is a substantial amount of bleeding, severe pain, chills, fever or difficulty passing urine (very rare) or other problems, you should call us at [phone] or report to the nearest emergency room.  7. Do  not stay seated continuously for more than 2-3 hours for a day or two after the procedure.  Tighten your buttock muscles 10-15 times every two hours and take 10-15 deep breaths every 1-2 hours.  Do not spend more than a few minutes on the toilet if you cannot empty your bowel; instead re-visit the toilet at a later time.

## 2016-06-24 ENCOUNTER — Ambulatory Visit: Payer: Self-pay | Admitting: Urology

## 2016-06-30 DIAGNOSIS — E785 Hyperlipidemia, unspecified: Secondary | ICD-10-CM | POA: Diagnosis not present

## 2016-06-30 DIAGNOSIS — I10 Essential (primary) hypertension: Secondary | ICD-10-CM | POA: Diagnosis not present

## 2016-07-01 LAB — COMPREHENSIVE METABOLIC PANEL
ALBUMIN: 4.2 g/dL (ref 3.6–5.1)
ALK PHOS: 86 U/L (ref 33–130)
ALT: 15 U/L (ref 6–29)
AST: 17 U/L (ref 10–35)
BILIRUBIN TOTAL: 0.5 mg/dL (ref 0.2–1.2)
BUN: 14 mg/dL (ref 7–25)
CO2: 27 mmol/L (ref 20–31)
CREATININE: 1.27 mg/dL — AB (ref 0.50–1.05)
Calcium: 9.3 mg/dL (ref 8.6–10.4)
Chloride: 103 mmol/L (ref 98–110)
Glucose, Bld: 86 mg/dL (ref 65–99)
Potassium: 3.9 mmol/L (ref 3.5–5.3)
SODIUM: 141 mmol/L (ref 135–146)
TOTAL PROTEIN: 7 g/dL (ref 6.1–8.1)

## 2016-07-01 LAB — LIPID PANEL
CHOLESTEROL: 210 mg/dL — AB (ref <200)
HDL: 34 mg/dL — ABNORMAL LOW (ref >50)
LDL Cholesterol: 142 mg/dL — ABNORMAL HIGH (ref <100)
Total CHOL/HDL Ratio: 6.2 Ratio — ABNORMAL HIGH (ref <5.0)
Triglycerides: 172 mg/dL — ABNORMAL HIGH (ref <150)
VLDL: 34 mg/dL — ABNORMAL HIGH (ref <30)

## 2016-07-02 ENCOUNTER — Ambulatory Visit (AMBULATORY_SURGERY_CENTER): Payer: BLUE CROSS/BLUE SHIELD

## 2016-07-02 ENCOUNTER — Encounter: Payer: Self-pay | Admitting: Family Medicine

## 2016-07-02 ENCOUNTER — Ambulatory Visit (INDEPENDENT_AMBULATORY_CARE_PROVIDER_SITE_OTHER): Payer: BLUE CROSS/BLUE SHIELD | Admitting: Family Medicine

## 2016-07-02 VITALS — BP 140/78 | HR 69 | Resp 15 | Ht 63.0 in | Wt 190.4 lb

## 2016-07-02 VITALS — Ht 63.0 in | Wt 191.2 lb

## 2016-07-02 DIAGNOSIS — I10 Essential (primary) hypertension: Secondary | ICD-10-CM

## 2016-07-02 DIAGNOSIS — R1319 Other dysphagia: Secondary | ICD-10-CM

## 2016-07-02 DIAGNOSIS — R9439 Abnormal result of other cardiovascular function study: Secondary | ICD-10-CM

## 2016-07-02 DIAGNOSIS — Z01419 Encounter for gynecological examination (general) (routine) without abnormal findings: Secondary | ICD-10-CM

## 2016-07-02 DIAGNOSIS — E785 Hyperlipidemia, unspecified: Secondary | ICD-10-CM

## 2016-07-02 DIAGNOSIS — G35 Multiple sclerosis: Secondary | ICD-10-CM

## 2016-07-02 DIAGNOSIS — N39498 Other specified urinary incontinence: Secondary | ICD-10-CM | POA: Diagnosis not present

## 2016-07-02 DIAGNOSIS — F418 Other specified anxiety disorders: Secondary | ICD-10-CM

## 2016-07-02 MED ORDER — ROSUVASTATIN CALCIUM 40 MG PO TABS: 40.0000 mg | ORAL_TABLET | Freq: Every day | ORAL | 3 refills | Status: DC

## 2016-07-02 NOTE — Patient Instructions (Addendum)
F/u in 6 month, call if you need me before  CONGRATS on "working down the list"    Please change eating , you are being referred to dietician  Change in cholesterol med to crestor  You are being referred to therapist  You are being referred to Dr Glo Herring   It is important that you exercise regularly at least 30 minutes 5 times a week. If you develop chest pain, have severe difficulty breathing, or feel very tired, stop exercising immediately and seek medical attention    Thanks for choosing [facility], we consider it a privelige to serve you. Please work on good  health habits so that your health will improve. 1. Commitment to daily physical activity for 30 to 60  minutes, if you are able to do this.  2. Commitment to wise food choices. Aim for half of your  food intake to be vegetable and fruit, one quarter starchy foods, and one quarter protein. Try to eat on a regular schedule  3 meals per day, snacking between meals should be limited to vegetables or fruits or small portions of nuts. 64 ounces of water per day is generally recommended, unless you have specific health conditions, like heart failure or kidney failure where you will need to limit fluid intake.  3. Commitment to sufficient and a  good quality of physical and mental rest daily, generally between 6 to 8 hours per day.  WITH PERSISTANCE AND PERSEVERANCE, THE IMPOSSIBLE , BECOMES THE NORM!  Thanks for choosing [facility], we consider it a privelige to serve you.

## 2016-07-02 NOTE — Progress Notes (Signed)
No allergies to eggs or soy No past problems with anesthesia No diet meds No home oxygen  Declined emmi 

## 2016-07-03 ENCOUNTER — Encounter: Payer: Self-pay | Admitting: Internal Medicine

## 2016-07-03 ENCOUNTER — Encounter: Payer: Self-pay | Admitting: Family Medicine

## 2016-07-03 NOTE — Assessment & Plan Note (Signed)
Controlled, no change in medication  

## 2016-07-03 NOTE — Assessment & Plan Note (Signed)
Adequate , though sub optimal control,  No med change, rely on lifestyle modification DASH diet and commitment to daily physical activity for a minimum of 30 minutes discussed and encouraged, as a part of hypertension management. The importance of attaining a healthy weight is also discussed.  BP/Weight 07/02/2016 07/02/2016 06/19/2016 06/03/2016 05/25/2016 11/01/1163 11/02/381  Systolic BP - 338 329 191 660 600 -  Diastolic BP - 78 90 68 79 67 -  Wt. (Lbs) 191.2 190.4 189.25 195 195 190 200.2  BMI 33.87 33.73 33.52 34.54 34.54 33.66 35.46  Some encounter information is confidential and restricted. Go to Review Flowsheets activity to see all data.

## 2016-07-03 NOTE — Progress Notes (Signed)
Pamela Walters     MRN: [id number]      DOB: 1960/12/21   HPI Pamela Walters is here for follow up and re-evaluation of chronic medical conditions, medication management and review of any available recent lab and radiology data.  Preventive health is updated, specifically  Cancer screening and Immunization.   Questions or concerns regarding consultations or procedures which the PT has had in the interim are  addressed. The PT denies any adverse reactions to current medications since the last visit.  Still needs gyne exam Wants dietician referral  ROS Denies recent fever or chills. Denies sinus pressure, nasal congestion, ear pain or sore throat. Denies chest congestion, productive cough or wheezing. Denies chest pains, palpitations and leg swelling Denies abdominal pain, nausea, vomiting,diarrhea or constipation.   Denies dysuria, frequency, hesitancy or incontinence. Denies joint pain, swelling and limitation in mobility. Denies headaches, seizures, numbness, or tingling. Denies  Uncontrolled depression, anxiety or insomnia.Requests return to therapist Denies skin break down or rash.   PE  BP 140/78   Pulse 69   Resp 15   Ht 5\' 3"  (1.6 m)   Wt 190 lb 6.4 oz (86.4 kg)   SpO2 96%   BMI 33.73 kg/m   Patient alert and oriented and in no cardiopulmonary distress.  HEENT: No facial asymmetry, EOMI,   oropharynx pink and moist.  Neck supple no JVD, no mass.  Chest: Clear to auscultation bilaterally.  CVS: S1, S2 no murmurs, no S3.Regular rate.  ABD: Soft non tender.   Ext: No edema  MS: Adequate ROM spine, shoulders, hips and knees.  Skin: Intact, no ulcerations or rash noted.  Psych: Good eye contact, normal affect. Memory intact not anxious or depressed appearing.  CNS: CN 2-12 intact, power,  normal throughout.no focal deficits noted.   Assessment & Plan  Essential hypertension Adequate , though sub optimal control,  No med change, rely on lifestyle  modification DASH diet and commitment to daily physical activity for a minimum of 30 minutes discussed and encouraged, as a part of hypertension management. The importance of attaining a healthy weight is also discussed.  BP/Weight 07/02/2016 07/02/2016 06/19/2016 06/03/2016 05/25/2016 06/29/4560 08/31/3891  Systolic BP - 734 287 681 157 262 -  Diastolic BP - 78 90 68 79 67 -  Wt. (Lbs) 191.2 190.4 189.25 195 195 190 200.2  BMI 33.87 33.73 33.52 34.54 34.54 33.66 35.46  Some encounter information is confidential and restricted. Go to Review Flowsheets activity to see all data.       Urinary incontinence Controlled, no change in medication   Hyperlipidemia LDL goal <100 Uncontrolled with no improvement despite max dose of pravachol, change to crstor and moduify diet and cooking style, requests nutrition consult also and she will be referred  Hyperlipidemia:Low fat diet discussed and encouraged.   Lipid Panel  Lab Results  Component Value Date   CHOL 210 (H) 06/30/2016   HDL 34 (L) 06/30/2016   LDLCALC 142 (H) 06/30/2016   TRIG 172 (H) 06/30/2016   CHOLHDL 6.2 (H) 06/30/2016   Updated lab in 4 month    Obesity Deteriorated. Patient re-educated about  the importance of commitment to a  minimum of 150 minutes of exercise per week.  The importance of healthy food choices with portion control discussed. Encouraged to start a food diary, count calories and to consider  joining a support group. Sample diet sheets offered. Goals set by the patient for the next several months.   Weight /  BMI 07/02/2016 07/02/2016 06/19/2016  WEIGHT 191 lb 3.2 oz 190 lb 6.4 oz 189 lb 4 oz  HEIGHT 5\' 3"  5\' 3"  5\' 3"   BMI 33.87 kg/m2 33.73 kg/m2 33.52 kg/m2  Some encounter information is confidential and restricted. Go to Review Flowsheets activity to see all data.      Multiple sclerosis ([place]) Followed annualy by neurology and maintained off of medication.Denies any new neurologic symptoms  Depression with  anxiety Doing well on medication however , requests return appt with therapist who she hjas not seen in 10 months , will refer

## 2016-07-03 NOTE — Assessment & Plan Note (Signed)
Followed annualy by neurology and maintained off of medication.Denies any new neurologic symptoms

## 2016-07-03 NOTE — Assessment & Plan Note (Signed)
Deteriorated. Patient re-educated about  the importance of commitment to a  minimum of 150 minutes of exercise per week.  The importance of healthy food choices with portion control discussed. Encouraged to start a food diary, count calories and to consider  joining a support group. Sample diet sheets offered. Goals set by the patient for the next several months.   Weight /BMI 07/02/2016 07/02/2016 06/19/2016  WEIGHT 191 lb 3.2 oz 190 lb 6.4 oz 189 lb 4 oz  HEIGHT 5\' 3"  5\' 3"  5\' 3"   BMI 33.87 kg/m2 33.73 kg/m2 33.52 kg/m2  Some encounter information is confidential and restricted. Go to Review Flowsheets activity to see all data.

## 2016-07-03 NOTE — Assessment & Plan Note (Signed)
Uncontrolled with no improvement despite max dose of pravachol, change to crstor and moduify diet and cooking style, requests nutrition consult also and she will be referred  Hyperlipidemia:Low fat diet discussed and encouraged.   Lipid Panel  Lab Results  Component Value Date   CHOL 210 (H) 06/30/2016   HDL 34 (L) 06/30/2016   LDLCALC 142 (H) 06/30/2016   TRIG 172 (H) 06/30/2016   CHOLHDL 6.2 (H) 06/30/2016   Updated lab in 4 month

## 2016-07-03 NOTE — Assessment & Plan Note (Signed)
Doing well on medication however , requests return appt with therapist who she hjas not seen in 10 months , will refer

## 2016-07-07 ENCOUNTER — Encounter: Payer: Self-pay | Admitting: *Deleted

## 2016-07-09 ENCOUNTER — Encounter: Payer: Self-pay | Admitting: Internal Medicine

## 2016-07-09 ENCOUNTER — Ambulatory Visit (AMBULATORY_SURGERY_CENTER): Payer: BLUE CROSS/BLUE SHIELD | Admitting: Internal Medicine

## 2016-07-09 VITALS — BP 137/81 | HR 68 | Temp 97.1°F | Resp 23 | Ht 63.0 in | Wt 191.0 lb

## 2016-07-09 DIAGNOSIS — R1310 Dysphagia, unspecified: Secondary | ICD-10-CM

## 2016-07-09 DIAGNOSIS — K253 Acute gastric ulcer without hemorrhage or perforation: Secondary | ICD-10-CM

## 2016-07-09 DIAGNOSIS — K31819 Angiodysplasia of stomach and duodenum without bleeding: Secondary | ICD-10-CM

## 2016-07-09 DIAGNOSIS — K2950 Unspecified chronic gastritis without bleeding: Secondary | ICD-10-CM | POA: Diagnosis not present

## 2016-07-09 DIAGNOSIS — K222 Esophageal obstruction: Secondary | ICD-10-CM | POA: Diagnosis not present

## 2016-07-09 DIAGNOSIS — K219 Gastro-esophageal reflux disease without esophagitis: Secondary | ICD-10-CM | POA: Diagnosis not present

## 2016-07-09 MED ORDER — SODIUM CHLORIDE 0.9 % IV SOLN: 500.0000 mL | INTRAVENOUS | Status: DC

## 2016-07-09 NOTE — Patient Instructions (Signed)
YOU HAD AN ENDOSCOPIC PROCEDURE TODAY AT [facility]:   Refer to the procedure report that was given to you for any specific questions about what was found during the examination.  If the procedure report does not answer your questions, please call your gastroenterologist to clarify.  If you requested that your care partner not be given the details of your procedure findings, then the procedure report has been included in a sealed envelope for you to review at your convenience later.  YOU SHOULD EXPECT: Some feelings of bloating in the abdomen. Passage of more gas than usual.  Walking can help get rid of the air that was put into your GI tract during the procedure and reduce the bloating. If you had a lower endoscopy (such as a colonoscopy or flexible sigmoidoscopy) you may notice spotting of blood in your stool or on the toilet paper. If you underwent a bowel prep for your procedure, you may not have a normal bowel movement for a few days.  Please Note:  You might notice some irritation and congestion in your nose or some drainage.  This is from the oxygen used during your procedure.  There is no need for concern and it should clear up in a day or so.  SYMPTOMS TO REPORT IMMEDIATELY:   Following lower endoscopy (colonoscopy or flexible sigmoidoscopy):  Excessive amounts of blood in the stool  Significant tenderness or worsening of abdominal pains  Swelling of the abdomen that is new, acute  Fever of 100F or higher   Following upper endoscopy (EGD)  Vomiting of blood or coffee ground material  New chest pain or pain under the shoulder blades  Painful or persistently difficult swallowing  New shortness of breath  Fever of 100F or higher  Black, tarry-looking stools  For urgent or emergent issues, a gastroenterologist can be reached at any hour by calling [phone].   DIET:  Clear liquids until 11:30, then soft foods the rest of today   ACTIVITY:  You should plan  to take it easy for the rest of today and you should NOT DRIVE or use heavy machinery until tomorrow (because of the sedation medicines used during the test).    FOLLOW UP: Our staff will call the number listed on your records the next business day following your procedure to check on you and address any questions or concerns that you may have regarding the information given to you following your procedure. If we do not reach you, we will leave a message.  However, if you are feeling well and you are not experiencing any problems, there is no need to return our call.  We will assume that you have returned to your regular daily activities without incident.  If any biopsies were taken you will be contacted by phone or by letter within the next 1-3 weeks.  Please call us at [phone] if you have not heard about the biopsies in 3 weeks.    SIGNATURES/CONFIDENTIALITY: You and/or your care partner have signed paperwork which will be entered into your electronic medical record.  These signatures attest to the fact that that the information above on your After Visit Summary has been reviewed and is understood.  Full responsibility of the confidentiality of this discharge information lies with you and/or your care-partner.    Follow dilatation diet given to you today  Information on gastritis given to you today   Await pathology results

## 2016-07-09 NOTE — Progress Notes (Signed)
Pt's states no medical or surgical changes since previsit or office visit. 

## 2016-07-09 NOTE — Progress Notes (Signed)
Report to PACU, RN, vss, BBS= Clear.  

## 2016-07-09 NOTE — Progress Notes (Signed)
Called to room to assist during endoscopic procedure.  Patient ID and intended procedure confirmed with present staff. Received instructions for my participation in the procedure from the performing physician.  

## 2016-07-09 NOTE — Op Note (Signed)
[facility] Patient Name: Pamela Walters Procedure Date: 07/09/2016 9:45 AM MRN: [id number] Endoscopist: Jerene Bears , MD Age: 56 Referring MD:  Date of Birth: October 01, 1960 Gender: Female Account #: [id number] Procedure:                Upper GI endoscopy Indications:              Dysphagia, Stenosis of the esophagus, history of                            esophageal food impaction, last EGD Dec 2015 with                            balloon dilation to 16.5 mm Medicines:                Monitored Anesthesia Care Procedure:                Pre-Anesthesia Assessment:                           - Prior to the procedure, a History and Physical                            was performed, and patient medications and                            allergies were reviewed. The patient's tolerance of                            previous anesthesia was also reviewed. The risks                            and benefits of the procedure and the sedation                            options and risks were discussed with the patient.                            All questions were answered, and informed consent                            was obtained. Prior Anticoagulants: The patient has                            taken no previous anticoagulant or antiplatelet                            agents. ASA Grade Assessment: II - A patient with                            mild systemic disease. After reviewing the risks                            and benefits, the patient was deemed in  satisfactory condition to undergo the procedure.                           After obtaining informed consent, the endoscope was                            passed under direct vision. Throughout the                            procedure, the patient's blood pressure, pulse, and                            oxygen saturations were monitored continuously. The                            Model GIF-[id number]  scope was introduced                            through the mouth, and advanced to the second part                            of duodenum. The upper GI endoscopy was                            accomplished without difficulty. The patient                            tolerated the procedure well. Scope In: Scope Out: Findings:                 A low-grade of narrowing Schatzki ring (acquired)                            was found at the gastroesophageal junction. A TTS                            dilator was passed through the scope. Dilation with                            a 16-17-18 mm balloon dilator was performed to 17                            mm. The dilation site was examined and showed                            moderate improvement in luminal narrowing.                           Multiple small sessile polyps were found in the                            gastric fundus and in the gastric body.  Moderate gastric antral vascular ectasia without                            bleeding was present in the gastric antrum.                           One non-bleeding superficial gastric ulcer with no                            stigmata of bleeding was found in the gastric                            antrum. The lesion was 4 mm in largest dimension.                            Biopsies were taken with a cold forceps for                            histology and Helicobacter pylori testing (antrum,                            incisura, body).                           The examined duodenum was normal. Complications:            No immediate complications. Estimated Blood Loss:     Estimated blood loss was minimal. Impression:               - Low-grade of narrowing Schatzki ring. Dilated to                            17 mm with balloon                           - Multiple benign (fundic gland in appearance)                            gastric polyps.                           -  Gastric antral vascular ectasia without bleeding.                           - Non-bleeding, small, gastric ulcer. Biopsied.                           - Normal examined duodenum. Recommendation:           - Patient has a contact number available for                            emergencies. The signs and symptoms of potential                            delayed complications were discussed with the  patient. Return to normal activities tomorrow.                            Written discharge instructions were provided to the                            patient.                           - Soft diet today.                           - Continue present medications including daily                            pantoprazole.                           - Await pathology results.                           - Repeat upper endoscopy PRN for retreatment. Jerene Bears, MD 07/09/2016 10:12:46 AM This report has been signed electronically.

## 2016-07-10 ENCOUNTER — Telehealth: Payer: Self-pay | Admitting: *Deleted

## 2016-07-10 NOTE — Telephone Encounter (Signed)
  Follow up Call-  Call back number 07/09/2016 06/03/2016 04/04/2014 12/18/2013  Post procedure Call Back phone  # [phone]  Permission to leave phone message Yes Yes Yes Yes  Some recent data might be hidden     Patient questions:  Do you have a fever, pain , or abdominal swelling? No. Pain Score  0 *  Have you tolerated food without any problems? Yes.    Have you been able to return to your normal activities? Yes.    Do you have any questions about your discharge instructions: Diet   No. Medications  No. Follow up visit  No.  Do you have questions or concerns about your Care? No.  Actions: * If pain score is 4 or above: No action needed, pain <4.

## 2016-07-14 ENCOUNTER — Encounter: Payer: Self-pay | Admitting: Internal Medicine

## 2016-07-20 ENCOUNTER — Encounter: Payer: Self-pay | Admitting: Internal Medicine

## 2016-07-28 ENCOUNTER — Telehealth: Payer: Self-pay

## 2016-07-28 DIAGNOSIS — IMO0001 Reserved for inherently not codable concepts without codable children: Secondary | ICD-10-CM

## 2016-07-28 NOTE — Telephone Encounter (Signed)
-----   Message from Fayrene Helper, MD sent at 07/03/2016 11:26 AM EST ----- Regarding: pls refer to nutritionist, per her request Dx is obesity and hyperlipidemia and hTN.  not sure if her ins will cover and if she still wants the referral after you gave her the printed info, so if she dos not want do not refer. ALSO needs rept fasting lipid and cmpin 3 to 3.5 months, I hope she dooes get the crestor!, pls order lab and let her know, tx

## 2016-08-11 ENCOUNTER — Ambulatory Visit (INDEPENDENT_AMBULATORY_CARE_PROVIDER_SITE_OTHER): Payer: Self-pay | Admitting: Internal Medicine

## 2016-08-11 ENCOUNTER — Encounter: Payer: Self-pay | Admitting: Internal Medicine

## 2016-08-11 VITALS — BP 138/70 | HR 70 | Ht 63.0 in | Wt 185.0 lb

## 2016-08-11 DIAGNOSIS — K648 Other hemorrhoids: Secondary | ICD-10-CM

## 2016-08-11 NOTE — Progress Notes (Signed)
Pamela Walters is a 56 year old female with symptomatic internal hemorrhoids She was seen initially for hemorrhoidal banding on 06/19/2016 She spontaneously passed the band after a small bowel movement on the day of her band placement For this reason I brought her back, no charge today, for repeat initial hemorrhoidal banding  Her symptoms consist of painless rectal bleeding and some prolapse. She's had bleeding as recently as yesterday  PROCEDURE NOTE:  The patient presents with symptomatic grade 2-3 internal hemorrhoids, requesting rubber band ligation of her hemorrhoidal disease.  All risks, benefits and alternative forms of therapy were described and informed consent was obtained.   The anorectum was pre-medicated with 0.125% nitroglycerin ointment The decision was made to band the RA internal hemorrhoid, and the [place] was used to perform band ligation without complication.   Digital anorectal examination was then performed to assure proper positioning of the band, and to adjust the banded tissue as required.  The patient was discharged home without pain or other issues.  Dietary and behavioral recommendations were given and along with follow-up instructions.     The patient will return as scheduled for follow-up and possible additional banding as required. No complications were encountered and the patient tolerated the procedure well.

## 2016-08-11 NOTE — Patient Instructions (Addendum)
You have been scheduled for your 2nd hemorrhoidal banding Wednesday 09/16/16 @ 3:45 pm.  HEMORRHOID BANDING PROCEDURE    FOLLOW-UP CARE   1. The procedure you have had should have been relatively painless since the banding of the area involved does not have nerve endings and there is no pain sensation.  The rubber band cuts off the blood supply to the hemorrhoid and the band may fall off as soon as 48 hours after the banding (the band may occasionally be seen in the toilet bowl following a bowel movement). You may notice a temporary feeling of fullness in the rectum which should respond adequately to plain Tylenol or Motrin.  2. Following the banding, avoid strenuous exercise that evening and resume full activity the next day.  A sitz bath (soaking in a warm tub) or bidet is soothing, and can be useful for cleansing the area after bowel movements.     3. To avoid constipation, take two tablespoons of natural wheat bran, natural oat bran, flax, Benefiber or any over the counter fiber supplement and increase your water intake to 7-8 glasses daily.    4. Unless you have been prescribed anorectal medication, do not put anything inside your rectum for two weeks: No suppositories, enemas, fingers, etc.  5. Occasionally, you may have more bleeding than usual after the banding procedure.  This is often from the untreated hemorrhoids rather than the treated one.  Don't be concerned if there is a tablespoon or so of blood.  If there is more blood than this, lie flat with your bottom higher than your head and apply an ice pack to the area. If the bleeding does not stop within a half an hour or if you feel faint, call our office at [phone] or go to the emergency room.  6. Problems are not common; however, if there is a substantial amount of bleeding, severe pain, chills, fever or difficulty passing urine (very rare) or other problems, you should call us at [phone] or report to the nearest  emergency room.  7. Do not stay seated continuously for more than 2-3 hours for a day or two after the procedure.  Tighten your buttock muscles 10-15 times every two hours and take 10-15 deep breaths every 1-2 hours.  Do not spend more than a few minutes on the toilet if you cannot empty your bowel; instead re-visit the toilet at a later time.

## 2016-08-19 ENCOUNTER — Telehealth (HOSPITAL_COMMUNITY): Payer: Self-pay | Admitting: *Deleted

## 2016-08-19 NOTE — Telephone Encounter (Signed)
left voice message regarding an appointment. 

## 2016-09-02 ENCOUNTER — Ambulatory Visit (INDEPENDENT_AMBULATORY_CARE_PROVIDER_SITE_OTHER): Payer: BLUE CROSS/BLUE SHIELD | Admitting: Neurology

## 2016-09-02 ENCOUNTER — Encounter: Payer: Self-pay | Admitting: Neurology

## 2016-09-02 VITALS — BP 134/78 | HR 70 | Resp 18 | Ht 63.0 in | Wt 192.0 lb

## 2016-09-02 DIAGNOSIS — G4733 Obstructive sleep apnea (adult) (pediatric): Secondary | ICD-10-CM

## 2016-09-02 DIAGNOSIS — R7983 Abnormal findings of blood amino-acid level: Secondary | ICD-10-CM

## 2016-09-02 DIAGNOSIS — G35 Multiple sclerosis: Secondary | ICD-10-CM | POA: Diagnosis not present

## 2016-09-02 DIAGNOSIS — E7219 Other disorders of sulfur-bearing amino-acid metabolism: Secondary | ICD-10-CM | POA: Diagnosis not present

## 2016-09-02 DIAGNOSIS — N39498 Other specified urinary incontinence: Secondary | ICD-10-CM

## 2016-09-02 DIAGNOSIS — M791 Myalgia, unspecified site: Secondary | ICD-10-CM

## 2016-09-02 DIAGNOSIS — R208 Other disturbances of skin sensation: Secondary | ICD-10-CM

## 2016-09-02 MED ORDER — FA-PYRIDOXINE-CYANOCOBALAMIN 2.5-25-2 MG PO TABS: 1.0000 | ORAL_TABLET | Freq: Every day | ORAL | 3 refills | Status: DC

## 2016-09-02 MED ORDER — OXYBUTYNIN CHLORIDE ER 10 MG PO TB24: 10.0000 mg | ORAL_TABLET | Freq: Every day | ORAL | 3 refills | Status: DC

## 2016-09-02 MED ORDER — GABAPENTIN 600 MG PO TABS
600.0000 mg | ORAL_TABLET | Freq: Three times a day (TID) | ORAL | 3 refills | Status: DC
Start: 2016-09-02 — End: 2016-09-02

## 2016-09-02 MED ORDER — ARMODAFINIL 250 MG PO TABS: 250.0000 mg | ORAL_TABLET | Freq: Every day | ORAL | 5 refills | Status: DC

## 2016-09-02 MED ORDER — TRAMADOL HCL 50 MG PO TABS: ORAL_TABLET | ORAL | 3 refills | Status: DC

## 2016-09-02 MED ORDER — GABAPENTIN 600 MG PO TABS: 600.0000 mg | ORAL_TABLET | Freq: Three times a day (TID) | ORAL | 3 refills | Status: DC

## 2016-09-02 NOTE — Progress Notes (Signed)
[facility]  PATIENT: Pamela Walters DOB: Oct 25, 1960  REFERRING CLINICIAN: [name] HISTORY FROM: Patient REASON FOR VISIT: Possible MS, fatigue, myalgias   HISTORICAL  CHIEF COMPLAINT:  Chief Complaint  Patient presents with  . Multiple Sclerosis    She remains off of MS dmt.  Sts. she only uses CPAP 3-4 hours per night.  Today she c/o worsening generalized aching pain/fim  . Sleep Apnea    HISTORY OF PRESENT ILLNESS:  She is a 56 year old woman who has probable MS and obstructive sleep apnea and has been placed on CPAP therapy.    MS:   She has been off of any disease modifying therapy since 2011 or so.    Her last MRI 03/27/2015 was unchanged compared to the MRI 02/09/2013.   She does not believe she has had any exacerbations.  Gait/strength/sensation:   She feels gait is the same -- she is mildly of balanced and veers some with walking.   Strength is good and she denies any significant spasticity.  She notes no numbness but has some painful tingling in the legs at times    Bladder:  She has bladder frequency and urgency helped by oxybutynin.   She tolerates it well.     Vision:   She feels vision is stable.      She wears glasses.     No h/o optic neuritis  OSA/sleep:  She has severe OSA with an AHI equal 37. She is on CPAP +9 cm. She has been using CPAP every night but not always for the entire night.      When she wears CPAP the whole night, she does feel less tired the next day.  She sometimes falls asleep outside of the bed and then doesn't use CPAP the whole night.   She has also noted less nocturia when she uses CPAP.   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   She is sometimes sleepy during the day. time        Fatigue:   She notes fatigue many days even if not sleepy.   The fatigue is both physical and mental.   Fatigue is always worse when she is hot.    Fatigue has been a little better since starting CPAP.      Mood/cognitive:    She has had mood swings and some irritability but no crying spells.  She sees Dr. Sima Matas.   She is gaining weight on Abilify but current regimen is helping mood a lot.   She notes decreased focus and attention.    Pain:   She reports myalgias in the legs. She gets some benefit from Skelaxin and hydrocodone and gabapentin.     She tolerates these well.   She has seen Dr. Rolena Infante and Dr. Nelva Bush for Pain.  She also has LBP from facet hypertrophy and spondylolisthesis at L4L5.     ESI injections have helped in past.    LBP is better  MS History: In  2006 and 2007 she presented with headaches. Headaches were bilateral and were associated with visual aura. Pain was throbbing and she had associated photophobia and phonophobia.  Mrs. Streat underwent an MRI of the brain and an MR angiogram. The angiogram was normal but the MRI of the brain showed multiple white matter foci in a nonspecific pattern. She reports that Dr. Doy Mince did a lumbar puncture and she was told that the results were abnormal, consistent with MS. She was placed on Betaseron  but had difficulties tolerating it. She has not been on any DMT for her MS over the last 4 - 5 years.  She did not have any definite exacerbations.  Specifically, there were no episodes of numbness, weakness, clumsiness or visual change that came on over a short period of time.  A repeat MRI of the brain showed that the might of been some progression of the MRI changes. An MRI of the thoracic spine did not show any spinal lesions.    DATA :   I reviewed her MRI images from 12//2016.  She has multiple white matter lesions and the majority are round and either subcortical or in the deep white matter.  A couple smaller ones are in the periventricular white matter.   There is symmetric hyperintensity in the mid midbrain.   I reviewed recent labwork showing normal vasculitis labs but elevated homocysteine.     MRI of the thoracic spine at that time showed no spinal cord lesions.  MRI of the lumbar spine shows degenerative changes at a worse at L4-L5. She has 4 mm of anterolisthesis at that level    REVIEW OF SYSTEMS:  Constitutional: No fevers, chills, sweats, or change in appetite.   She has fatigue Eyes: No double vision,.   She notes eye pain Ear, nose and throat: No hearing loss, ear pain, nasal congestion, sore throat Cardiovascular: No chest pain, palpitations Respiratory:  No shortness of breath at rest or with exertion.   No wheezes GastrointestinaI: No nausea, vomiting, diarrhea, abdominal pain, fecal incontinence.   She has a hiatal hernia.  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   Genitourinary:  reports frequency and has 2 x nocturia.  No nocturia. Musculoskeletal:  No neck pain.  She reports lower back pain and has L4L5 DJD Integumentary: No rash, pruritus, skin lesions Neurological: as above Psychiatric: No depression at this time.  No anxiety Endocrine: Her weight is stable.   She has had hot flashes recently Hematologic/Lymphatic:  No anemia, purpura, petechiae. Allergic/Immunologic: No itchy/runny eyes, nasal congestion, recent allergic reactions, rashes  ALLERGIES: No Known Allergies  HOME MEDICATIONS: Outpatient Medications Prior to Visit  Medication Sig Dispense Refill  . aspirin 81 MG tablet Take 81 mg by mouth daily.    Marland Kitchen FLUoxetine (PROZAC) 20 MG capsule Take 20 mg by mouth daily.    Marland Kitchen FLUoxetine (PROZAC) 40 MG capsule Take 40 mg by mouth daily.    Marland Kitchen ibuprofen (ADVIL,MOTRIN) 200 MG tablet Take 800 mg by mouth every 6 (six) hours as needed for headache or moderate pain.    . isosorbide mononitrate (IMDUR) 30 MG 24 hr tablet Take 0.5 tablets (15 mg total) by mouth daily. 15 tablet 3  . metaxalone (SKELAXIN) 800 MG tablet Take 800 mg by mouth 2 (two) times daily as needed for muscle spasms.     . mometasone (NASONEX) 50 MCG/ACT nasal spray Place 2 sprays into the nose daily. 51 g 1  . Multiple Vitamin (MULTIVITAMIN WITH  MINERALS) TABS tablet Take 1 tablet by mouth daily.    . pantoprazole (PROTONIX) 40 MG tablet Take 1 tablet (40 mg total) by mouth every morning. 90 tablet 0  . ranitidine (ZANTAC) 150 MG tablet Take 1 tablet (150 mg total) by mouth at bedtime. 90 tablet 0  . rosuvastatin (CRESTOR) 40 MG tablet Take 1 tablet (40 mg total) by mouth daily. 90 tablet 3  . triamterene-hydrochlorothiazide (MAXZIDE-25) 37.5-25 MG tablet TAKE 1 TABLET BY MOUTH  DAILY 90 tablet 1  . folic acid-pyridoxine-cyancobalamin (FOLTX) 2.5-25-2 MG TABS tablet Take 1 tablet by mouth daily. 90 each 3  . gabapentin (NEURONTIN) 600 MG tablet Take 1 tablet (600 mg total) by mouth 3 (three) times daily. 270 tablet 3  . oxybutynin (DITROPAN-XL) 10 MG 24 hr tablet Take 1 tablet (10 mg total) by mouth at bedtime. 90 tablet 3  . metoprolol tartrate (LOPRESSOR) 25 MG tablet Take 0.5 tablets (12.5 mg total) by mouth 2 (two) times daily. 90 tablet 3   Facility-Administered Medications Prior to Visit  Medication Dose Route Frequency Provider Last Rate Last Dose  . 0.9 %  sodium chloride infusion  500 mL Intravenous Continuous Pyrtle, Lajuan Lines, MD      . 0.9 %  sodium chloride infusion  500 mL Intravenous Continuous Pyrtle, Lajuan Lines, MD      . gadopentetate dimeglumine (MAGNEVIST) injection 20 mL  20 mL Intravenous Once PRN Ajahni Nay, Nanine Means, MD        PAST MEDICAL HISTORY: Past Medical History:  Diagnosis Date  . Allergy   . Back pain   . Depression   . GERD (gastroesophageal reflux disease)   . Hepatic steatosis   . Hiatal hernia   . Hyperlipidemia   . Hypertension   . Internal hemorrhoids   . MS (multiple sclerosis) ([place])    2007  . Multiple sclerosis ([place])   . Schatzki's ring   . Sleep apnea    wears CPAP    PAST SURGICAL HISTORY: Past Surgical History:  Procedure Laterality Date  . ABDOMINAL HYSTERECTOMY  2005   fibroids  . CARDIAC CATHETERIZATION N/A 05/04/2016   Procedure: Left Heart Cath and Coronary Angiography;  Surgeon:  Jettie Booze, MD;  Location: [facility];  Service: Cardiovascular;  Laterality: N/A;  . ESOPHAGOGASTRODUODENOSCOPY N/A 09/18/2013   Procedure: ESOPHAGOGASTRODUODENOSCOPY (EGD);  Surgeon: Jerene Bears, MD;  Location: [facility];  Service: Gastroenterology;  Laterality: N/A;  . UPPER GASTROINTESTINAL ENDOSCOPY    . WISDOM TOOTH EXTRACTION      FAMILY HISTORY: Family History  Problem Relation Age of Onset  . Hypertension Mother   . Hyperlipidemia Mother   . Depression Mother   . Hypertension Father   . Colon cancer Neg Hx   . Esophageal cancer Neg Hx   . Rectal cancer Neg Hx   . Stomach cancer Neg Hx     SOCIAL HISTORY:  Social History   Social History  . Marital status: Married    Spouse name: N/A  . Number of children: N/A  . Years of education: N/A   Occupational History  . Not on file.   Social History Main Topics  . Smoking status: Never Smoker  . Smokeless tobacco: Never Used  . Alcohol use No  . Drug use: No  . Sexual activity: Yes    Birth control/ protection: Surgical   Other Topics Concern  . Not on file   Social History Narrative  . No narrative on file     PHYSICAL EXAM  Vitals:   09/02/16 1306  BP: 134/78  Pulse: 70  Resp: 18  Weight: 192 lb (87.1 kg)  Height: 5\' 3"  (1.6 m)    Body mass index is 34.01 kg/m.   General: The patient is well-developed and well-nourished and in no acute distress.  Affect is normal.   Musculoskeletal:   She has mild tenderness in the lower lumbar spine there is no tenderness in the muscles of the  upper back or chest.   Neurologic Exam  Mental status: The patient is alert and oriented x 3 at the time of the examination. The patient has apparent normal recent and remote memory, with an apparently normal attention span and concentration ability.   Speech is normal.  Cranial nerves: Extraocular movements are full. Facial strength and sensation is normal. Palatal elevation tongue protrusion  midline.  Trapezius and sternocleidomastoid strength is normal. No dysarthria is noted.     Motor:  Muscle bulk is normal. Tone was normal in the arms but slightly increased in the legs, a little more on the right. Strength is  5 / 5 in all 4 extremities.   Sensory: Sensory testing is intact to touch in all 4 extremities.  Coordination: Cerebellar testing reveals good finger-nose-finger   Gait and station: The station is stable.. Gait was normal. Tandem walk was mildly wide.   Borderline Romberg.    Reflexes: Deep tendon reflexes are brisk in the legs withpout spread and normal in the arms bilaterally.     DIAGNOSTIC DATA (LABS, IMAGING, TESTING) - I reviewed patient records, labs, notes, testing and imaging myself where available.  Lab Results  Component Value Date   WBC 8.7 04/29/2016   HGB 11.9 (L) 04/29/2016   HCT 36.9 04/29/2016   MCV 80.6 04/29/2016   PLT 332 04/29/2016      Component Value Date/Time   NA 141 06/30/2016 1611   K 3.9 06/30/2016 1611   CL 103 06/30/2016 1611   CO2 27 06/30/2016 1611   GLUCOSE 86 06/30/2016 1611   BUN 14 06/30/2016 1611   CREATININE 1.27 (H) 06/30/2016 1611   CALCIUM 9.3 06/30/2016 1611   PROT 7.0 06/30/2016 1611   ALBUMIN 4.2 06/30/2016 1611   AST 17 06/30/2016 1611   ALT 15 06/30/2016 1611   ALKPHOS 86 06/30/2016 1611   BILITOT 0.5 06/30/2016 1611   GFRNONAA >60 04/29/2016 1509   GFRNONAA 74 02/20/2016 0841   GFRAA >60 04/29/2016 1509   GFRAA 86 02/20/2016 0841   Lab Results  Component Value Date   CHOL 210 (H) 06/30/2016   HDL 34 (L) 06/30/2016   LDLCALC 142 (H) 06/30/2016   TRIG 172 (H) 06/30/2016   CHOLHDL 6.2 (H) 06/30/2016   Lab Results  Component Value Date   HGBA1C 5.4 02/20/2016   No results found for: VITAMINB12 Lab Results  Component Value Date   TSH 4.02 02/20/2016       ASSESSMENT AND PLAN  Multiple sclerosis ([place])  Homocysteinemia ([place]) - Plan: folic acid-pyridoxine-cyancobalamin (FOLTX)  2.5-25-2 MG TABS tablet, DISCONTINUED: folic acid-pyridoxine-cyancobalamin (FOLTX) 2.5-25-2 MG TABS tablet  Myalgia  Dysesthesia  Obstructive sleep apnea  Other urinary incontinence   1.   She will continue off any disease modifying therapy for her probable MS. We will check an MRI every 2-3 years to make sure that there has not been subclinical progression. If this occurs, we will need to get her back on a MS disease modifying therapy.  2.    She is advised to continue CPAP and try to get 6 or more hours of use every night.  3.  Continue current medications . She will continue to see Psych and Pain management for her other issues.   4.  Nuvigil for daytime sleepiness, she worked disorder and continued hypersomnolence despite CPAP use.  Return in 12 months or sooner if there are new or worsening neurologic symptoms.   Damare Serano A. Felecia Shelling, MD, PhD 09/02/2016, 4:46 PM  Certified in Neurology, [facility], Sleep Medicine, Pain Medicine and Neuroimaging  [facility] [address], [place], [place] [address] [phone]

## 2016-09-04 ENCOUNTER — Encounter: Payer: Self-pay | Admitting: Internal Medicine

## 2016-09-04 MED ORDER — PANTOPRAZOLE SODIUM 40 MG PO TBEC: 40.0000 mg | DELAYED_RELEASE_TABLET | ORAL | 2 refills | Status: DC

## 2016-09-16 ENCOUNTER — Encounter: Payer: Self-pay | Admitting: Internal Medicine

## 2016-09-16 ENCOUNTER — Ambulatory Visit (INDEPENDENT_AMBULATORY_CARE_PROVIDER_SITE_OTHER): Payer: BLUE CROSS/BLUE SHIELD | Admitting: Internal Medicine

## 2016-09-16 VITALS — BP 124/70 | HR 76 | Ht 62.21 in | Wt 186.5 lb

## 2016-09-16 DIAGNOSIS — K648 Other hemorrhoids: Secondary | ICD-10-CM

## 2016-09-16 NOTE — Patient Instructions (Addendum)
You have been scheduled for your 3rd hemorrhoidal banding on Monday, 10/05/16 @ 3:45 pm.  HEMORRHOID BANDING PROCEDURE    FOLLOW-UP CARE   1. The procedure you have had should have been relatively painless since the banding of the area involved does not have nerve endings and there is no pain sensation.  The rubber band cuts off the blood supply to the hemorrhoid and the band may fall off as soon as 48 hours after the banding (the band may occasionally be seen in the toilet bowl following a bowel movement). You may notice a temporary feeling of fullness in the rectum which should respond adequately to plain Tylenol or Motrin.  2. Following the banding, avoid strenuous exercise that evening and resume full activity the next day.  A sitz bath (soaking in a warm tub) or bidet is soothing, and can be useful for cleansing the area after bowel movements.     3. To avoid constipation, take two tablespoons of natural wheat bran, natural oat bran, flax, Benefiber or any over the counter fiber supplement and increase your water intake to 7-8 glasses daily.    4. Unless you have been prescribed anorectal medication, do not put anything inside your rectum for two weeks: No suppositories, enemas, fingers, etc.  5. Occasionally, you may have more bleeding than usual after the banding procedure.  This is often from the untreated hemorrhoids rather than the treated one.  Don't be concerned if there is a tablespoon or so of blood.  If there is more blood than this, lie flat with your bottom higher than your head and apply an ice pack to the area. If the bleeding does not stop within a half an hour or if you feel faint, call our office at [phone] or go to the emergency room.  6. Problems are not common; however, if there is a substantial amount of bleeding, severe pain, chills, fever or difficulty passing urine (very rare) or other problems, you should call us at [phone] or report to the nearest  emergency room.  7. Do not stay seated continuously for more than 2-3 hours for a day or two after the procedure.  Tighten your buttock muscles 10-15 times every two hours and take 10-15 deep breaths every 1-2 hours.  Do not spend more than a few minutes on the toilet if you cannot empty your bowel; instead re-visit the toilet at a later time.

## 2016-09-16 NOTE — Progress Notes (Signed)
Pamela Walters is a 56 year old female with history of symptomatic internal hemorrhoids She had initial banding on 06/19/2016 but the band dislodged shortly after band placement -- this was deemed unsuccessful She had a second band placed on 08/11/2016 to the right anterior internal hemorrhoid and has done well Primary symptoms were painless rectal bleeding and prolapse Significant improvement in bleeding after first, successful, banding   PROCEDURE NOTE:  The patient presents with symptomatic grade 2-3 internal hemorrhoids, requesting rubber band ligation of her hemorrhoidal disease.  All risks, benefits and alternative forms of therapy were described and informed consent was obtained.   The anorectum was pre-medicated with 0.125% nitroglycerin ointment The decision was made to band the RP (RA band 1)  internal hemorrhoid, and the [place] was used to perform band ligation without complication.   Digital anorectal examination was then performed to assure proper positioning of the band, and to adjust the banded tissue as required.  The patient was discharged home without pain or other issues.  Dietary and behavioral recommendations were given and along with follow-up instructions.     The patient will return as scheduled for  follow-up and possible additional banding as required. No complications were encountered and the patient tolerated the procedure well.

## 2016-09-16 NOTE — Progress Notes (Signed)
internl h

## 2016-09-28 ENCOUNTER — Encounter: Payer: Self-pay | Admitting: Neurology

## 2016-09-30 ENCOUNTER — Ambulatory Visit (INDEPENDENT_AMBULATORY_CARE_PROVIDER_SITE_OTHER): Payer: BLUE CROSS/BLUE SHIELD | Admitting: Psychiatry

## 2016-09-30 ENCOUNTER — Encounter (HOSPITAL_COMMUNITY): Payer: Self-pay | Admitting: Psychiatry

## 2016-09-30 DIAGNOSIS — F331 Major depressive disorder, recurrent, moderate: Secondary | ICD-10-CM | POA: Diagnosis not present

## 2016-09-30 NOTE — Progress Notes (Signed)
     THERAPIST PROGRESS NOTE  Session Time:    Wednesday 09/30/2016 8:15 AM - 9:10 AM  Participation Level: Active  Behavioral Response: CasualAlertAnxious and Depressed  Type of Therapy: Individual Therapy        Treatment Goals:  1. Learn and implement coping and behavioral strategies to overcome depression.     2. Verbalize an understanding and resolution of current interpersonal problems.     3. Learn and implement conflict resolution skills to resolve interpersonal problems.   Treatment Goals addressed: 1  Interventions: Supportive, ACT, CBT  Summary: PATRECE TALLIE is a 56 y.o. female who is referred for services by PCP Dr. Moshe Cipro due to patient suffering symptoms of depression. Patient reports several family issues.  She reports becoming very stressed and depressed last year when her 30 year old granddaughter attempted suicide. She continues to worry about granddaughter who resides with patient's daughter who did not reveal to patient that granddaughter was having any problems until the suicide attempt occurred.  Patient reports strained relationship with daughter as she and her husband have raised her daughter's oldest two children. Her daughter's third child was raised by another relative. Daughter has made poor choices and continues to to have difficulty making responsible choices which leads to constant tension in the relationship between patient and daughter per patient's report.  Patient's daughter lost her job due to legal issues and has been unable to find a job. Patient reports stress related to daughter now constantly asking for money. 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   Patient last was seen about a year ago. She reports doing well until shortly after Christmas when she began to experience increased stress. 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 Patient's current symptoms include feeling down, loss of appetite, difficulty falling/staying asleep, loss of pleasure, diminished interest, poor motivation, poor concentration, and memory difficulty. She admits feelings of hopelessness but denies any SI. She reports anxiety comes and goes. Patient also experiences inappropriate guilt regarding son suffering from depression.   Therapist Response:   Reviewed symptoms, discussed stressors, facilitated expression of feelingsprovided psychoeducation regarding depression and treatment, discussed importance of balance in life and self-care, assisted patient develop plan to adjust work schedule to try to achieve more balance between job, responsibilities, and self-care, assisted patient identify and address thoughts and processes that may inhibit implementation of plan, encouraged patient to begin to focus on self care regarding nutrition, sleep pattern, and exercise. Patient also agrees to schedule an appointment with psychiatrist Dr. Harrington Challenger for medication evaluation.    Plan: Return again in 2 weeks. Patient agrees to implement strategies discussed in session and schedule appointment with psychiatrist Dr. Harrington Challenger for medication evaluation.   Diagnosis: Axis I: Major Depressive Disorder, Recurrent, Moderate    Axis II: No diagnosis    BYNUM,PEGGY, LCSW 09/30/2016

## 2016-10-05 ENCOUNTER — Encounter: Payer: Self-pay | Admitting: Internal Medicine

## 2016-10-05 ENCOUNTER — Ambulatory Visit (INDEPENDENT_AMBULATORY_CARE_PROVIDER_SITE_OTHER): Payer: BLUE CROSS/BLUE SHIELD | Admitting: Internal Medicine

## 2016-10-05 VITALS — BP 162/86 | HR 60 | Ht 62.21 in | Wt 185.1 lb

## 2016-10-05 DIAGNOSIS — K648 Other hemorrhoids: Secondary | ICD-10-CM

## 2016-10-05 NOTE — Patient Instructions (Signed)

## 2016-10-05 NOTE — Progress Notes (Signed)
Pamela Walters is a 56 year old female with history of symptomatic internal hemorrhoids Now status post hemorrhoidal banding 2 on 08/11/2016 and 09/16/2016 Symptoms prior to banding were painless rectal bleeding and prolapse She reports that after to banding she feels definitive improvement in hemorrhoidal symptoms primarily prolapse. She's had some minor bleeding since her last banding.   PROCEDURE NOTE:  The patient presents with symptomatic grade 2-3 internal hemorrhoids, requesting rubber band ligation of her hemorrhoidal disease.  All risks, benefits and alternative forms of therapy were described and informed consent was obtained.   The anorectum was pre-medicated with 0.125% nitroglycerin ointment The decision was made to band the LL internal hemorrhoid, and the [place] was used to perform band ligation without complication.   Digital anorectal examination was then performed to assure proper positioning of the band, and to adjust the banded tissue as required.  The patient was discharged home without pain or other issues.  Dietary and behavioral recommendations were given and along with follow-up instructions.    The patient will return as needed for follow-up and possible additional banding as required. No complications were encountered and the patient tolerated the procedure well.

## 2016-10-20 ENCOUNTER — Encounter (HOSPITAL_COMMUNITY): Payer: Self-pay | Admitting: Psychiatry

## 2016-10-20 ENCOUNTER — Ambulatory Visit (INDEPENDENT_AMBULATORY_CARE_PROVIDER_SITE_OTHER): Payer: BLUE CROSS/BLUE SHIELD | Admitting: Psychiatry

## 2016-10-20 DIAGNOSIS — F331 Major depressive disorder, recurrent, moderate: Secondary | ICD-10-CM

## 2016-10-20 NOTE — Progress Notes (Signed)
THERAPIST PROGRESS NOTE  Session Time:    Tuesday 10/20/2016 10;12 AM - 11:05 AM  Participation Level: Active  Behavioral Response: CasualAlertAnxious and Depressed                    Type of Therapy: Individual Therapy        Treatment Goals:  1. Learn and implement coping and behavioral strategies to overcome depression.     2. Verbalize an understanding and resolution of current interpersonal problems.     3. Learn and implement conflict resolution skills to resolve interpersonal problems.   Treatment Goals addressed: 1  Interventions: Supportive, ACT, CBT  Summary: Pamela Walters is a 56 y.o. female who is referred for services by PCP Dr. Moshe Cipro due to patient suffering symptoms of depression. Patient reports several family issues.  She reports becoming very stressed and depressed last year when her 81 year old granddaughter attempted suicide. She continues to worry about granddaughter who resides with patient's daughter who did not reveal to patient that granddaughter was having any problems until the suicide attempt occurred.  Patient reports strained relationship with daughter as she and her husband have raised her daughter's oldest two children. Her daughter's third child was raised by another relative. Daughter has made poor choices and continues to to have difficulty making responsible choices which leads to constant tension in the relationship between patient and daughter per patient's report.  Patient's daughter lost her job due to legal issues and has been unable to find a job. Patient reports stress related to daughter now constantly asking for money. 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   Patient resumed services around the beginning of June 2017 after not being in treatment for a year. She reports doing well until shortly after Christmas  2017 when she began to experience increased stress. Stressors include  having heart catheterization in January 2018, son having a depressive episode in April 2018, ongoing issues with her daughter, working excessive and unpredictable hours as employer is short staffed, and patient along with her husband adjusting to being empty nesters after 42 years now that her granddaughter has moved from their home. Patient's current symptoms include feeling down, loss of appetite, difficulty falling/staying asleep, loss of pleasure, diminished interest, poor motivation, poor concentration, and memory difficulty. She admits feelings of hopelessness but denies any SI. She reports anxiety comes and goes. Patient also experiences inappropriate guilt regarding son suffering from depression.   Patient last was seen 2 weeks ago. She reports little to no change in symptoms since last session. She continues to experience depressed mood, poor motivation, fatigue, anxiety, and excessive worry. She reports she did enjoy a family beach trip but also experienced sadness and worry about her children, grandchildren, and the family dynamics. She reports grief and loss issues regarding loss dreams for her daughter. She also continues to worry about her son and his family as he is suffering from depression. She also shares today unresolved issues regarding the loss of a friendship prior to Christmas of last year. Patient reports she has been using controlled breathing to manage stress but has not been practicing regularly. She has begun to improve eating patterns. She also has talked with her supervisor at work regarding adjusting her work schedule in mid July. Patient is scheduled to see psychiatrist Dr. Harrington Challenger for medication evaluation on 11/13/2016.    Therapist Response:   Reviewed symptoms, discussed stressors,  facilitated expression of thoughts and feelings, praise and reinforced patient's use of assertiveness skills regarding her job/efforts to improve eating patterns and practice relaxation technique,  discussed rationale for practicing relaxation techniques regularly and assigned patient to practice daily, assisted patient explore ways to improve self-care regarding exercise including water aerobics,   Plan: Return again in 2 weeks. Patient agrees to implement strategies discussed in session and schedule appointment with psychiatrist Dr. Harrington Challenger for medication evaluation.   Diagnosis: Axis I: Major Depressive Disorder, Recurrent, Moderate    Axis II: No diagnosis    Eila Runyan, LCSW 10/20/2016

## 2016-10-22 ENCOUNTER — Encounter: Payer: Self-pay | Admitting: Family Medicine

## 2016-10-22 ENCOUNTER — Ambulatory Visit (INDEPENDENT_AMBULATORY_CARE_PROVIDER_SITE_OTHER): Payer: BLUE CROSS/BLUE SHIELD | Admitting: Family Medicine

## 2016-10-22 VITALS — BP 120/70 | HR 58 | Temp 98.9°F | Resp 16 | Ht 63.0 in | Wt 189.0 lb

## 2016-10-22 DIAGNOSIS — I10 Essential (primary) hypertension: Secondary | ICD-10-CM

## 2016-10-22 DIAGNOSIS — E785 Hyperlipidemia, unspecified: Secondary | ICD-10-CM

## 2016-10-22 DIAGNOSIS — J0100 Acute maxillary sinusitis, unspecified: Secondary | ICD-10-CM

## 2016-10-22 MED ORDER — FLUCONAZOLE 150 MG PO TABS: ORAL_TABLET | ORAL | 0 refills | Status: DC

## 2016-10-22 MED ORDER — LEVOFLOXACIN 500 MG PO TABS: 500.0000 mg | ORAL_TABLET | Freq: Every day | ORAL | 0 refills | Status: DC

## 2016-10-22 NOTE — Patient Instructions (Signed)
F/u as before, call if you need me sooner  You are treated for acute left  maxillary sinusitis   Work excuse starting today to return next week  Monday  Please get rest and take care of yourself  Hope you feel better soon

## 2016-10-22 NOTE — Assessment & Plan Note (Signed)
3 week h/o left maxillary pressure and ear pain

## 2016-10-23 ENCOUNTER — Other Ambulatory Visit: Payer: Self-pay

## 2016-10-23 DIAGNOSIS — J3089 Other allergic rhinitis: Secondary | ICD-10-CM

## 2016-10-23 MED ORDER — MOMETASONE FUROATE 50 MCG/ACT NA SUSP: 2.0000 | Freq: Every day | NASAL | 1 refills | Status: DC

## 2016-10-25 ENCOUNTER — Encounter: Payer: Self-pay | Admitting: Family Medicine

## 2016-10-25 NOTE — Progress Notes (Signed)
   Pamela Walters     MRN: [id number]      DOB: 06/17/1960   HPI Pamela Walters is here for follow up and re-evaluation of chronic medical conditions, medication management and review of any available recent lab and radiology data.  Preventive health is updated, specifically  Cancer screening and Immunization.   Questions or concerns regarding consultations or procedures which the PT has had in the interim are  addressed. The PT denies any adverse reactions to current medications since the last visit.  1 week h/o left maxillary sinus pressure and green drainage , worsening over time, intermittent chills, no fever, also left ear pain  ROS Denies chest congestion, productive cough or wheezing. Denies chest pains, palpitations and leg swelling Denies abdominal pain, nausea, vomiting,diarrhea or constipation.   Denies dysuria, frequency, hesitancy or incontinence. Denies joint pain, swelling and limitation in mobility. Denies headaches, seizures, numbness, or tingling. Denies depression, anxiety or insomnia. Denies skin break down or rash.   PE  BP 120/70   Pulse (!) 58   Temp 98.9 F (37.2 C) (Oral)   Resp 16   Ht 5\' 3"  (1.6 m)   Wt 189 lb (85.7 kg)   SpO2 96%   BMI 33.48 kg/m   Patient alert and oriented and in no cardiopulmonary distress.  HEENT: No facial asymmetry, EOMI,   oropharynx pink and moist.  Neck supple no JVD, no mass.Left maxillary sinus tenderness, TM clear bilaterally  Chest: Clear to auscultation bilaterally.  CVS: S1, S2 no murmurs, no S3.Regular rate.  ABD: Soft non tender.   Ext: No edema  MS: Adequate ROM spine, shoulders, hips and knees.  Skin: Intact, no ulcerations or rash noted.  Psych: Good eye contact, normal affect. Memory intact not anxious or depressed appearing.  CNS: CN 2-12 intact, power,  normal throughout.no focal deficits noted.   Assessment & Plan  Maxillary sinusitis, acute 3 week h/o left maxillary pressure and ear  pain  Essential hypertension Controlled, no change in medication DASH diet and commitment to daily physical activity for a minimum of 30 minutes discussed and encouraged, as a part of hypertension management. The importance of attaining a healthy weight is also discussed.  BP/Weight 10/22/2016 10/05/2016 09/16/2016 09/02/2016 08/11/2016 4/56/2563 12/03/3732  Systolic BP 287 681 157 262 035 597 -  Diastolic BP 70 86 70 78 70 81 -  Wt. (Lbs) 189 185.13 186.5 192 185 191 191.2  BMI 33.48 33.63 33.89 34.01 32.77 33.83 33.87  Some encounter information is confidential and restricted. Go to Review Flowsheets activity to see all data.       Hyperlipidemia LDL goal <100 Hyperlipidemia:Low fat diet discussed and encouraged.   Lipid Panel  Lab Results  Component Value Date   CHOL 210 (H) 06/30/2016   HDL 34 (L) 06/30/2016   LDLCALC 142 (H) 06/30/2016   TRIG 172 (H) 06/30/2016   CHOLHDL 6.2 (H) 06/30/2016   Uncontrolled Updated lab needed at/ before next visit.

## 2016-10-25 NOTE — Assessment & Plan Note (Signed)
Hyperlipidemia:Low fat diet discussed and encouraged.   Lipid Panel  Lab Results  Component Value Date   CHOL 210 (H) 06/30/2016   HDL 34 (L) 06/30/2016   LDLCALC 142 (H) 06/30/2016   TRIG 172 (H) 06/30/2016   CHOLHDL 6.2 (H) 06/30/2016   Uncontrolled Updated lab needed at/ before next visit.

## 2016-10-25 NOTE — Assessment & Plan Note (Signed)
Controlled, no change in medication DASH diet and commitment to daily physical activity for a minimum of 30 minutes discussed and encouraged, as a part of hypertension management. The importance of attaining a healthy weight is also discussed.  BP/Weight 10/22/2016 10/05/2016 09/16/2016 09/02/2016 08/11/2016 9/97/7414 05/30/9530  Systolic BP 023 343 568 616 837 290 -  Diastolic BP 70 86 70 78 70 81 -  Wt. (Lbs) 189 185.13 186.5 192 185 191 191.2  BMI 33.48 33.63 33.89 34.01 32.77 33.83 33.87  Some encounter information is confidential and restricted. Go to Review Flowsheets activity to see all data.

## 2016-11-02 ENCOUNTER — Encounter (HOSPITAL_COMMUNITY): Payer: Self-pay | Admitting: Psychiatry

## 2016-11-02 ENCOUNTER — Ambulatory Visit (INDEPENDENT_AMBULATORY_CARE_PROVIDER_SITE_OTHER): Payer: BLUE CROSS/BLUE SHIELD | Admitting: Psychiatry

## 2016-11-02 DIAGNOSIS — F331 Major depressive disorder, recurrent, moderate: Secondary | ICD-10-CM

## 2016-11-02 NOTE — Progress Notes (Signed)
     THERAPIST PROGRESS NOTE  Session Time:    Monday 11/02/2016 10:00 AM - 11:05 AM   Participation Level: Active  Behavioral Response: CasualAlertAnxious and Depressed                    Type of Therapy: Individual Therapy        Treatment Goals:  1. Learn and implement coping and behavioral strategies to overcome depression.     2. Identify and replace thoughts and beliefs that support depression.        Treatment Goals addressed: 1  Interventions: Supportive,  CBT  Summary: Pamela Walters is a 56 y.o. female who is referred for services by PCP Dr. Moshe Cipro due to patient suffering symptoms of depression. Patient reports several family issues.  She reports becoming very stressed and depressed last year when her 88 year old granddaughter attempted suicide. She continues to worry about granddaughter who resides with patient's daughter who did not reveal to patient that granddaughter was having any problems until the suicide attempt occurred.  Patient reports strained relationship with daughter as she and her husband have raised her daughter's oldest two children. Her daughter's third child was raised by another relative. Daughter has made poor choices and continues to to have difficulty making responsible choices which leads to constant tension in the relationship between patient and daughter per patient's report.  Patient's daughter lost her job due to legal issues and has been unable to find a job. Patient reports stress related to daughter now constantly asking for money. 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   Patient resumed services around the beginning of June 2017 after not being in treatment for a year. She reports doing well until shortly after Christmas  2017 when she began to experience increased stress. Stressors include having heart catheterization in January 2018, son having a depressive episode in April 2018, ongoing  issues with her daughter, working excessive and unpredictable hours as employer is short staffed, and patient along with her husband adjusting to being empty nesters after 42 years now that her granddaughter has moved from their home. Patient's current symptoms include feeling down, loss of appetite, difficulty falling/staying asleep, loss of pleasure, diminished interest, poor motivation, poor concentration, and memory difficulty. She admits feelings of hopelessness but denies any SI. She reports anxiety comes and goes. Patient also experiences inappropriate guilt regarding son suffering from depression.   Patient last was seen 2 weeks ago. She reports feeling a little better and not quite as depressed since last session. She reports getting more rest and eating better this past weekend as she went on a trip with her family. She reports enjoying the trip and having interest in activities again. She continues to worry about her family at times and has a tendency to blame herself for various issues. She also has unresolved issues regarding a friendship that was terminated last year.   Therapist Response:   Reviewed symptoms, provided psychoeducation regarding depression, assisted patient identify triggers of recent relapse, discussed lapse versus relapse, discussed stressors, facilitated expression of thoughts and feelings regarding loss of friendship last year, praised and reinforced patient's efforts to improve self-care, reviewed and revised treatment plan  Plan: Return again in 2 weeks.   Diagnosis: Axis I: Major Depressive Disorder, Recurrent, Moderate    Axis II: No diagnosis    Ammy Lienhard, LCSW 11/02/2016

## 2016-11-13 ENCOUNTER — Ambulatory Visit (HOSPITAL_COMMUNITY): Payer: Self-pay | Admitting: Psychiatry

## 2016-11-16 ENCOUNTER — Ambulatory Visit (INDEPENDENT_AMBULATORY_CARE_PROVIDER_SITE_OTHER): Payer: BLUE CROSS/BLUE SHIELD | Admitting: Psychiatry

## 2016-11-16 ENCOUNTER — Encounter (HOSPITAL_COMMUNITY): Payer: Self-pay | Admitting: Psychiatry

## 2016-11-16 DIAGNOSIS — F331 Major depressive disorder, recurrent, moderate: Secondary | ICD-10-CM

## 2016-11-16 NOTE — Progress Notes (Addendum)
THERAPIST PROGRESS NOTE  Session Time:    Monday 11/16/2016 9:02 AM - 10:02 AM  Participation Level: Active  Behavioral Response: CasualAlertAnxious and Depressed                    Type of Therapy: Individual Therapy        Treatment Goals:  1. Learn and implement coping and behavioral strategies to overcome depression.     2. Identify and replace thoughts and beliefs that support depression.        Treatment Goals addressed: 1  Interventions: Supportive,  CBT  Summary: Pamela Walters is a 56 y.o. female who is referred for services by PCP Dr. Moshe Cipro due to patient suffering symptoms of depression. Patient reports several family issues.  She reports becoming very stressed and depressed last year when her 31 year old granddaughter attempted suicide. She continues to worry about granddaughter who resides with patient's daughter who did not reveal to patient that granddaughter was having any problems until the suicide attempt occurred.  Patient reports strained relationship with daughter as she and her husband have raised her daughter's oldest two children. Her daughter's third child was raised by another relative. Daughter has made poor choices and continues to to have difficulty making responsible choices which leads to constant tension in the relationship between patient and daughter per patient's report.  Patient's daughter lost her job due to legal issues and has been unable to find a job. Patient reports stress related to daughter now constantly asking for money. 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   Patient resumed services around the beginning of June 2017 after not being in treatment for a year. She reports doing well until shortly after Christmas  2017 when she began to experience increased stress. Stressors include having heart catheterization in January 2018, son having a depressive episode in April 2018, ongoing  issues with her daughter, working excessive and unpredictable hours as employer is short staffed, and patient along with her husband adjusting to being empty nesters after 42 years now that her granddaughter has moved from their home. Patient's current symptoms include feeling down, loss of appetite, difficulty falling/staying asleep, loss of pleasure, diminished interest, poor motivation, poor concentration, and memory difficulty. She admits feelings of hopelessness but denies any SI. She reports anxiety comes and goes. Patient also experiences inappropriate guilt regarding son suffering from depression.   Patient last was seen 2 weeks ago. She reports little to no change in symptoms since last session. She continues to experience depressed mood, fatigue, poor motivation, social withdrawal, loss of appetite, and little involvement in activity except for her job. She also reports beginning to experience some anxiety. She reports missing her medication management appointment with psychiatrist Dr. Harrington Challenger last Friday as she overslept. She agrees to reschedule appointment as soon as possible. Patient also reports increased negative thinking and feeling self defeated. She does report she is beginning to have some excitement about planning to go shopping for school supplies for her co-workers child this weekend.   Therapist Response:   Reviewed symptoms, provided psychoeducation regarding depression, assisted patient identify early warning signs of depression and ways to intervene, discussed the role of self-care in managing depression, a signed patient to complete assessment regarding self-care and bring to next session, assisted patient develop plan to increase physical activity and eating patterns, praised and reinforced her efforts to increase behavioral activation regarding shopping for  co workers child this weekend, introduced concept of using a gratitude journal, provided patient with instructions and  suggestions regarding keeping journal, aside patient to complete 2 times per week and bring to next session,  Plan: Return again in 2 weeks.   Diagnosis: Axis I: Major Depressive Disorder, Recurrent, Moderate    Axis II: No diagnosis    Pamela Bacot, LCSW 11/16/2016

## 2016-11-18 ENCOUNTER — Ambulatory Visit: Payer: Self-pay | Admitting: Urology

## 2016-11-30 ENCOUNTER — Encounter (HOSPITAL_COMMUNITY): Payer: Self-pay | Admitting: Psychiatry

## 2016-11-30 ENCOUNTER — Ambulatory Visit (INDEPENDENT_AMBULATORY_CARE_PROVIDER_SITE_OTHER): Payer: BLUE CROSS/BLUE SHIELD | Admitting: Psychiatry

## 2016-11-30 DIAGNOSIS — F331 Major depressive disorder, recurrent, moderate: Secondary | ICD-10-CM | POA: Diagnosis not present

## 2016-11-30 NOTE — Progress Notes (Signed)
THERAPIST PROGRESS NOTE  Session Time:    Monday 11/30/2016 9:05 AM -  10:06 AM  Participation Level: Active  Behavioral Response: CasualAlertAnxious and Depressed                    Type of Therapy: Individual Therapy        Treatment Goals:  1. Learn and implement coping and behavioral strategies to overcome depression.     2. Identify and replace thoughts and beliefs that support depression.         Treatment Goals addressed: 1  Interventions: Supportive,  CBT  Summary: Pamela Walters is a 56 y.o. female who is referred for services by PCP Dr. Moshe Cipro due to patient suffering symptoms of depression. Patient reports several family issues.  She reports becoming very stressed and depressed last year when her 49 year old granddaughter attempted suicide. She continues to worry about granddaughter who resides with patient's daughter who did not reveal to patient that granddaughter was having any problems until the suicide attempt occurred.  Patient reports strained relationship with daughter as she and her husband have raised her daughter's oldest two children. Her daughter's third child was raised by another relative. Daughter has made poor choices and continues to to have difficulty making responsible choices which leads to constant tension in the relationship between patient and daughter per patient's report.  Patient's daughter lost her job due to legal issues and has been unable to find a job. Patient reports stress related to daughter now constantly asking for money. 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   Patient resumed services around the beginning of June 2017 after not being in treatment for a year. She reports doing well until shortly after Christmas  2017 when she began to experience increased stress. Stressors include having heart catheterization in January 2018, son having a depressive episode in April 2018, ongoing  issues with her daughter, working excessive and unpredictable hours as employer is short staffed, and patient along with her husband adjusting to being empty nesters after 42 years now that her granddaughter has moved from their home. Patient's current symptoms include feeling down, loss of appetite, difficulty falling/staying asleep, loss of pleasure, diminished interest, poor motivation, poor concentration, and memory difficulty. She admits feelings of hopelessness but denies any SI. She reports anxiety comes and goes. Patient also experiences inappropriate guilt regarding son suffering from depression.           Patient last was seen 2- 3 weeks ago. She reports little to no change in symptoms since last session. She continues to experience depressed mood, fatigue, poor motivation, social withdrawal, loss of appetite, and little involvement in activity except for her job. She reports joint a recent weekend trip to the beach and says she was relaxed. She says she almost felt like her normal self but then started feeling herself go down when she returned home. She reports no new stressors but continues to express frustration about her daughter and concerns about her son. Patient also reports continuing to work excessive hours and having difficulty setting boundaries. She has not scheduled an appointment with psychiatrist Dr. Harrington Challenger but agrees to do so today. Patient reports completing the self-care assessment states recognizing she needs to improve self-care regarding rest and eating patterns. She also did the gratitude journal and reports this was helpful.   Therapist Response:   Reviewed symptoms, praise and reinforced patient's completion  of homework, assisted patient identify and address thoughts and processes that inhibit implementation of positive self-care, discussed patient spirituality and ways to use and reinforce and self-care, assisted patient develop a list of reasons to improve self-care and assigned  patient to read daily, assisted patient identify ways to improve assertiveness skills and did role play regarding setting and maintain boundaries with her employer regarding her work hours.   Plan: Return again in 2 weeks.   Diagnosis: Axis I: Major Depressive Disorder, Recurrent, Moderate    Axis II: No diagnosis    Cordarrius Coad, LCSW 11/30/2016

## 2016-12-14 ENCOUNTER — Ambulatory Visit (HOSPITAL_COMMUNITY): Payer: BLUE CROSS/BLUE SHIELD | Admitting: Psychiatry

## 2016-12-21 ENCOUNTER — Encounter (HOSPITAL_COMMUNITY): Payer: Self-pay | Admitting: Psychiatry

## 2016-12-21 ENCOUNTER — Ambulatory Visit (INDEPENDENT_AMBULATORY_CARE_PROVIDER_SITE_OTHER): Payer: BLUE CROSS/BLUE SHIELD | Admitting: Psychiatry

## 2016-12-21 VITALS — BP 150/98 | Ht 63.0 in | Wt 183.0 lb

## 2016-12-21 DIAGNOSIS — F331 Major depressive disorder, recurrent, moderate: Secondary | ICD-10-CM

## 2016-12-21 DIAGNOSIS — Z818 Family history of other mental and behavioral disorders: Secondary | ICD-10-CM

## 2016-12-21 MED ORDER — DULOXETINE HCL 60 MG PO CPEP: 60.0000 mg | ORAL_CAPSULE | Freq: Every day | ORAL | 2 refills | Status: DC
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
## 2016-12-29 ENCOUNTER — Ambulatory Visit (INDEPENDENT_AMBULATORY_CARE_PROVIDER_SITE_OTHER): Payer: BLUE CROSS/BLUE SHIELD | Admitting: Psychiatry

## 2016-12-29 ENCOUNTER — Encounter (HOSPITAL_COMMUNITY): Payer: Self-pay | Admitting: Psychiatry

## 2016-12-29 DIAGNOSIS — F331 Major depressive disorder, recurrent, moderate: Secondary | ICD-10-CM

## 2016-12-29 NOTE — Progress Notes (Signed)
THERAPIST PROGRESS NOTE  Session Time:    Tuesday 12/29/2016 9:06 AM - 10:02 AM   Participation Level: Active  Behavioral Response: CasualAlertAnxious and Depressed                    Type of Therapy: Individual Therapy        Treatment Goals:  1. Learn and implement coping and behavioral strategies to overcome depression.     2. Identify and replace thoughts and beliefs that support depression.         Treatment Goals addressed: 1,2  Interventions: Supportive,  CBT  Summary: Pamela Walters is a 56 y.o. female who is referred for services by PCP Dr. Moshe Cipro due to patient suffering symptoms of depression. Patient reports several family issues.  She reports becoming very stressed and depressed last year when her 66 year old granddaughter attempted suicide. She continues to worry about granddaughter who resides with patient's daughter who did not reveal to patient that granddaughter was having any problems until the suicide attempt occurred.  Patient reports strained relationship with daughter as she and her husband have raised her daughter's oldest two children. Her daughter's third child was raised by another relative. Daughter has made poor choices and continues to to have difficulty making responsible choices which leads to constant tension in the relationship between patient and daughter per patient's report.  Patient's daughter lost her job due to legal issues and has been unable to find a job. Patient reports stress related to daughter now constantly asking for money. 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   Patient resumed services around the beginning of June 2017 after not being in treatment for a year. She reports doing well until shortly after Christmas  2017 when she began to experience increased stress. Stressors include having heart catheterization in January 2018, son having a depressive episode in April 2018,  ongoing issues with her daughter, working excessive and unpredictable hours as employer is short staffed, and patient along with her husband adjusting to being empty nesters after 42 years now that her granddaughter has moved from their home. Patient's current symptoms include feeling down, loss of appetite, difficulty falling/staying asleep, loss of pleasure, diminished interest, poor motivation, poor concentration, and memory difficulty. She admits feelings of hopelessness but denies any SI. She reports anxiety comes and goes. Patient also experiences inappropriate guilt regarding son suffering from depression.           Patient last was seen 4 weeks ago. She reports little to no change in symptoms since last session. She has seen psychiatrist Dr. Harrington Challenger and has begun taking Cymbalta. However, patient reports it makes her very sleepy and plans to change the time that she takes it. She still reports poor appetite but has improved her eating patterns. She also reports recently enjoying a meal. She continues to work excessive hours and reports continued poor sleep patterns along with fatigue. She reports she is contemplating switching from third shift to first shift to try to improve her sleeping patterns and quality of life. She reports she did recently set boundaries about a particular work day and is pleased with her improved use of assertiveness skills. She continues to express frustration regarding her daughter's behavior.   Therapist Response:   Reviewed symptoms, praised and reinforced patient's improved use of assertiveness skills, discussed effects on mood and behavior, praised and reinforced patient's increased positive self-care regarding eating  patterns and exercise, assisted patient identify thoughts and concerns about possibly switching shifts, discussed pros and cons, assisted patient identify realistic expectations of the relationship with her daughter by replacing unrealistic demands with wishes  and desires  Plan: Return again in 2 weeks.   Diagnosis: Axis I: Major Depressive Disorder, Recurrent, Moderate    Axis II: No diagnosis    Kerrin Markman, LCSW 12/29/2016

## 2017-01-05 ENCOUNTER — Ambulatory Visit: Payer: Self-pay | Admitting: Family Medicine

## 2017-01-19 ENCOUNTER — Ambulatory Visit (HOSPITAL_COMMUNITY): Payer: BLUE CROSS/BLUE SHIELD | Admitting: Psychiatry

## 2017-01-29 ENCOUNTER — Telehealth: Payer: Self-pay

## 2017-01-29 DIAGNOSIS — E785 Hyperlipidemia, unspecified: Secondary | ICD-10-CM | POA: Diagnosis not present

## 2017-01-29 DIAGNOSIS — I10 Essential (primary) hypertension: Secondary | ICD-10-CM

## 2017-01-29 LAB — COMPLETE METABOLIC PANEL WITH GFR
AG RATIO: 1.4 (calc) (ref 1.0–2.5)
ALBUMIN MSPROF: 4.3 g/dL (ref 3.6–5.1)
ALT: 19 U/L (ref 6–29)
AST: 24 U/L (ref 10–35)
Alkaline phosphatase (APISO): 77 U/L (ref 33–130)
BILIRUBIN TOTAL: 0.5 mg/dL (ref 0.2–1.2)
BUN: 12 mg/dL (ref 7–25)
CALCIUM: 9.6 mg/dL (ref 8.6–10.4)
CO2: 28 mmol/L (ref 20–32)
Chloride: 103 mmol/L (ref 98–110)
Creat: 0.92 mg/dL (ref 0.50–1.05)
GFR, EST AFRICAN AMERICAN: 80 mL/min/{1.73_m2} (ref >=60)
GFR, EST NON AFRICAN AMERICAN: 69 mL/min/{1.73_m2} (ref >=60)
GLUCOSE: 104 mg/dL — AB (ref 65–99)
Globulin: 3 g/dL (calc) (ref 1.9–3.7)
Potassium: 4.1 mmol/L (ref 3.5–5.3)
Sodium: 143 mmol/L (ref 135–146)
TOTAL PROTEIN: 7.3 g/dL (ref 6.1–8.1)

## 2017-01-29 LAB — LIPID PANEL
Cholesterol: 273 mg/dL — ABNORMAL HIGH (ref <200)
HDL: 36 mg/dL — AB (ref >50)
LDL CHOLESTEROL (CALC): 194 mg/dL — AB
Non-HDL Cholesterol (Calc): 237 mg/dL (calc) — ABNORMAL HIGH (ref <130)
TRIGLYCERIDES: 234 mg/dL — AB (ref <150)
Total CHOL/HDL Ratio: 7.6 (calc) — ABNORMAL HIGH (ref <5.0)

## 2017-01-29 NOTE — Telephone Encounter (Signed)
New lab orders placed.

## 2017-02-02 ENCOUNTER — Ambulatory Visit (INDEPENDENT_AMBULATORY_CARE_PROVIDER_SITE_OTHER): Payer: BLUE CROSS/BLUE SHIELD | Admitting: Family Medicine

## 2017-02-02 ENCOUNTER — Ambulatory Visit (HOSPITAL_COMMUNITY)
Admission: RE | Admit: 2017-02-02 | Discharge: 2017-02-02 | Disposition: A | Discharge status: Home / Self Care | Payer: BLUE CROSS/BLUE SHIELD | Source: Ambulatory Visit | Attending: Family Medicine | Admitting: Family Medicine

## 2017-02-02 ENCOUNTER — Encounter: Payer: Self-pay | Admitting: Family Medicine

## 2017-02-02 VITALS — BP 144/82 | HR 76 | Resp 14 | Ht 63.0 in | Wt 183.0 lb

## 2017-02-02 DIAGNOSIS — H9202 Otalgia, left ear: Secondary | ICD-10-CM

## 2017-02-02 DIAGNOSIS — I10 Essential (primary) hypertension: Secondary | ICD-10-CM | POA: Diagnosis not present

## 2017-02-02 DIAGNOSIS — J320 Chronic maxillary sinusitis: Secondary | ICD-10-CM | POA: Diagnosis not present

## 2017-02-02 DIAGNOSIS — F418 Other specified anxiety disorders: Secondary | ICD-10-CM | POA: Diagnosis not present

## 2017-02-02 DIAGNOSIS — R0981 Nasal congestion: Secondary | ICD-10-CM | POA: Insufficient documentation

## 2017-02-02 DIAGNOSIS — M549 Dorsalgia, unspecified: Secondary | ICD-10-CM

## 2017-02-02 DIAGNOSIS — E785 Hyperlipidemia, unspecified: Secondary | ICD-10-CM

## 2017-02-02 DIAGNOSIS — M47812 Spondylosis without myelopathy or radiculopathy, cervical region: Secondary | ICD-10-CM | POA: Diagnosis not present

## 2017-02-02 DIAGNOSIS — R51 Headache: Secondary | ICD-10-CM | POA: Diagnosis not present

## 2017-02-02 NOTE — Assessment & Plan Note (Addendum)
3 month history of intermittent left maxillary pressure and ear pain, get x ray if demonstrates chronic infection treat for 3 weeks with antibiotic , if not , then needs to go to ENT

## 2017-02-02 NOTE — Patient Instructions (Addendum)
F/u in 3 months, call if you need me sooner  Please GET RID OF all the oils, butter, cheese and nuts in tins  Snacks are fruit and vegetable ONLy  Grill and bake and NO CHEESE for you!  Skimmed milk ONLY  Please sched gyne appt  It is important that you exercise regularly at least 30 minutes 5 times a week. If you develop chest pain, have severe difficulty breathing, or feel very tired, stop exercising immediately and seek medical attention    CBC, fasting lipid, cmp and EGFr, hBa1C, tSH, and Vit D 1 week before next visit  X ray of sinus today after you leave if no infection then I will refer you to ENT re chronic left ear and sinus pain  Left ear exam is normal  Please schedule your November mammogram at the radiology dept today when you go there   High Cholesterol High cholesterol is a condition in which the blood has high levels of a white, waxy, fat-like substance (cholesterol). The human body needs small amounts of cholesterol. The liver makes all the cholesterol that the body needs. Extra (excess) cholesterol comes from the food that we eat. Cholesterol is carried from the liver by the blood through the blood vessels. If you have high cholesterol, deposits (plaques) may build up on the walls of your blood vessels (arteries). Plaques make the arteries narrower and stiffer. Cholesterol plaques increase your risk for heart attack and stroke. Work with your health care provider to keep your cholesterol levels in a healthy range. What increases the risk? This condition is more likely to develop in people who:  Eat foods that are high in animal fat (saturated fat) or cholesterol.  Are overweight.  Are not getting enough exercise.  Have a family history of high cholesterol.  What are the signs or symptoms? There are no symptoms of this condition. How is this diagnosed? This condition may be diagnosed from the results of a blood test.  If you are older than age 73, your  health care provider may check your cholesterol every 4-6 years.  You may be checked more often if you already have high cholesterol or other risk factors for heart disease.  The blood test for cholesterol measures:  "Bad" cholesterol (LDL cholesterol). This is the main type of cholesterol that causes heart disease. The desired level for LDL is less than 100.  "Good" cholesterol (HDL cholesterol). This type helps to protect against heart disease by cleaning the arteries and carrying the LDL away. The desired level for HDL is 60 or higher.  Triglycerides. These are fats that the body can store or burn for energy. The desired number for triglycerides is lower than 150.  Total cholesterol. This is a measure of the total amount of cholesterol in your blood, including LDL cholesterol, HDL cholesterol, and triglycerides. A healthy number is less than 200.  How is this treated? This condition is treated with diet changes, lifestyle changes, and medicines. Diet changes  This may include eating more whole grains, fruits, vegetables, nuts, and fish.  This may also include cutting back on red meat and foods that have a lot of added sugar. Lifestyle changes  Changes may include getting at least 40 minutes of aerobic exercise 3 times a week. Aerobic exercises include walking, biking, and swimming. Aerobic exercise along with a healthy diet can help you maintain a healthy weight.  Changes may also include quitting smoking. Medicines  Medicines are usually given if diet and  lifestyle changes have failed to reduce your cholesterol to healthy levels.  Your health care provider may prescribe a statin medicine. Statin medicines have been shown to reduce cholesterol, which can reduce the risk of heart disease. Follow these instructions at home: Eating and drinking  If told by your health care provider:  Eat chicken (without skin), fish, veal, shellfish, ground [place] breast, and round or loin cuts of  red meat.  Do not eat fried foods or fatty meats, such as hot dogs and salami.  Eat plenty of fruits, such as apples.  Eat plenty of vegetables, such as broccoli, potatoes, and carrots.  Eat beans, peas, and lentils.  Eat grains such as barley, rice, couscous, and bulgur wheat.  Eat pasta without cream sauces.  Use skim or nonfat milk, and eat low-fat or nonfat yogurt and cheeses.  Do not eat or drink whole milk, cream, ice cream, egg yolks, or hard cheeses.  Do not eat stick margarine or tub margarines that contain trans fats (also called partially hydrogenated oils).  Do not eat saturated tropical oils, such as coconut oil and palm oil.  Do not eat cakes, cookies, crackers, or other baked goods that contain trans fats.  General instructions  Exercise as directed by your health care provider. Increase your activity level with activities such as gardening, walking, and taking the stairs.  Take over-the-counter and prescription medicines only as told by your health care provider.  Do not use any products that contain nicotine or tobacco, such as cigarettes and e-cigarettes. If you need help quitting, ask your health care provider.  Keep all follow-up visits as told by your health care provider. This is important. Contact a health care provider if:  You are struggling to maintain a healthy diet or weight.  You need help to start on an exercise program.  You need help to stop smoking. Get help right away if:  You have chest pain.  You have trouble breathing. This information is not intended to replace advice given to you by your health care provider. Make sure you discuss any questions you have with your health care provider. Document Released: 04/13/2005 Document Revised: 11/09/2015 Document Reviewed: 10/12/2015 Elsevier Interactive Patient Education  2017 [employer].

## 2017-02-02 NOTE — Progress Notes (Signed)
Pamela Walters     MRN: [id number]      DOB: 1961-03-22   HPI [name] is here for follow up and re-evaluation of chronic medical conditions, medication management and review of any available recent lab and radiology data.  Preventive health is updated, specifically  Cancer screening and Immunization.   . The PT denies any adverse reactions to current medications since the last visit.  Left ear pain intermittently x 3 months and left maxillary pressure , no fever  Or chills   ROS Denies recent fever or chills. Denies sinus pressure, nasal congestion, ear pain or sore throat. Denies chest congestion, productive cough or wheezing. Denies chest pains, palpitations and leg swelling Denies abdominal pain, nausea, vomiting,diarrhea or constipation.   Denies dysuria, frequency, hesitancy or incontinence. Denies joint pain, swelling and limitation in mobility. Denies headaches, seizures, numbness, or tingling. Denies depression, anxiety or insomnia. Denies skin break down or rash.   PE  BP (!) 144/82   Pulse 76   Resp 14   Ht 5\' 3"  (1.6 m)   Wt 183 lb 0.6 oz (83 kg)   BMI 32.42 kg/m   Patient alert and oriented and in no cardiopulmonary distress.  HEENT: No facial asymmetry, EOMI,   oropharynx pink and moist.  Neck supple no JVD, no mass.  Chest: Clear to auscultation bilaterally.  CVS: S1, S2 no murmurs, no S3.Regular rate.  ABD: Soft non tender.   Ext: No edema  MS: Adequate ROM spine, shoulders, hips and knees.  Skin: Intact, no ulcerations or rash noted.  Psych: Good eye contact, normal affect. Memory intact not anxious or depressed appearing.  CNS: CN 2-12 intact, power,  normal throughout.no focal deficits noted.   Assessment & Plan  Chronic left maxillary sinusitis 3 month history of intermittent left maxillary pressure and ear pain, get x ray if demonstrates chronic infection treat for 3 weeks with antibiotic , if not , then needs to go to  ENT  Essential hypertension Sub optimal control, no med change DASH diet and commitment to daily physical activity for a minimum of 30 minutes discussed and encouraged, as a part of hypertension management. The importance of attaining a healthy weight is also discussed.  BP/Weight 02/02/2017 10/22/2016 10/05/2016 09/16/2016 09/02/2016 08/11/2016 6/31/4970  Systolic BP 263 785 885 027 741 287 867  Diastolic BP 82 70 86 70 78 70 81  Wt. (Lbs) 183.04 189 185.13 186.5 192 185 191  BMI 32.42 33.48 33.63 33.89 34.01 32.77 33.83  Some encounter information is confidential and restricted. Go to Review Flowsheets activity to see all data.       Hyperlipidemia LDL goal <100 Deteriorated Needs to change diet and style of cooking Will ad zetia also Hyperlipidemia:Low fat diet discussed and encouraged.   Lipid Panel  Lab Results  Component Value Date   CHOL 273 (H) 01/29/2017   HDL 36 (L) 01/29/2017   LDLCALC 142 (H) 06/30/2016   TRIG 234 (H) 01/29/2017   CHOLHDL 7.6 (H) 01/29/2017     Updated lab needed at/ before next visit. Hyperlipidemia:Low fat diet discussed and encouraged.   Lipid Panel  Lab Results  Component Value Date   CHOL 273 (H) 01/29/2017   HDL 36 (L) 01/29/2017   LDLCALC 142 (H) 06/30/2016   TRIG 234 (H) 01/29/2017   CHOLHDL 7.6 (H) 01/29/2017       Depression with anxiety Managed by psychiatry and stable  Otalgia of left ear Month h/o left ear pain,  exam is normal. If normal sinus xray, then needs to  See ENT  Back pain with radiation Chronic and controlled with gabapentin

## 2017-02-03 DIAGNOSIS — H9202 Otalgia, left ear: Secondary | ICD-10-CM | POA: Insufficient documentation

## 2017-02-03 MED ORDER — EZETIMIBE 10 MG PO TABS: 10.0000 mg | ORAL_TABLET | Freq: Every day | ORAL | 5 refills | Status: DC

## 2017-02-03 NOTE — Assessment & Plan Note (Signed)
Deteriorated Needs to change diet and style of cooking Will ad zetia also Hyperlipidemia:Low fat diet discussed and encouraged.   Lipid Panel  Lab Results  Component Value Date   CHOL 273 (H) 01/29/2017   HDL 36 (L) 01/29/2017   LDLCALC 142 (H) 06/30/2016   TRIG 234 (H) 01/29/2017   CHOLHDL 7.6 (H) 01/29/2017     Updated lab needed at/ before next visit. Hyperlipidemia:Low fat diet discussed and encouraged.   Lipid Panel  Lab Results  Component Value Date   CHOL 273 (H) 01/29/2017   HDL 36 (L) 01/29/2017   LDLCALC 142 (H) 06/30/2016   TRIG 234 (H) 01/29/2017   CHOLHDL 7.6 (H) 01/29/2017

## 2017-02-03 NOTE — Assessment & Plan Note (Signed)
Month h/o left ear pain, exam is normal. If normal sinus xray, then needs to  See ENT

## 2017-02-03 NOTE — Assessment & Plan Note (Signed)
Chronic and controlled with gabapentin

## 2017-02-03 NOTE — Assessment & Plan Note (Signed)
Managed by psychiatry and stable 

## 2017-02-03 NOTE — Assessment & Plan Note (Signed)
Sub optimal control, no med change DASH diet and commitment to daily physical activity for a minimum of 30 minutes discussed and encouraged, as a part of hypertension management. The importance of attaining a healthy weight is also discussed.  BP/Weight 02/02/2017 10/22/2016 10/05/2016 09/16/2016 09/02/2016 08/11/2016 4/58/5929  Systolic BP 244 628 638 177 116 579 038  Diastolic BP 82 70 86 70 78 70 81  Wt. (Lbs) 183.04 189 185.13 186.5 192 185 191  BMI 32.42 33.48 33.63 33.89 34.01 32.77 33.83  Some encounter information is confidential and restricted. Go to Review Flowsheets activity to see all data.

## 2017-02-05 ENCOUNTER — Other Ambulatory Visit: Payer: Self-pay | Admitting: Family Medicine

## 2017-02-05 ENCOUNTER — Encounter: Payer: Self-pay | Admitting: Family Medicine

## 2017-02-05 DIAGNOSIS — H9202 Otalgia, left ear: Secondary | ICD-10-CM

## 2017-02-05 NOTE — Progress Notes (Signed)
amb ent  

## 2017-02-16 ENCOUNTER — Other Ambulatory Visit: Payer: Self-pay | Admitting: Family Medicine

## 2017-02-16 ENCOUNTER — Telehealth: Payer: Self-pay | Admitting: Neurology

## 2017-02-16 DIAGNOSIS — I10 Essential (primary) hypertension: Secondary | ICD-10-CM

## 2017-02-16 MED ORDER — TRAMADOL HCL 50 MG PO TABS: ORAL_TABLET | ORAL | 3 refills | Status: DC

## 2017-02-16 MED ORDER — ARMODAFINIL 250 MG PO TABS: 250.0000 mg | ORAL_TABLET | Freq: Every day | ORAL | 5 refills | Status: DC

## 2017-02-16 NOTE — Telephone Encounter (Signed)
Patient requesting new Rx's for traMADol (ULTRAM) 50 MG tablet and Armodafinil 250 MG tablet called to [facility]. She is now getting mail order.

## 2017-02-16 NOTE — Telephone Encounter (Signed)
Rx's faxed to [facility] as requested/fim

## 2017-02-16 NOTE — Telephone Encounter (Signed)
Seen [phone] 

## 2017-02-16 NOTE — Addendum Note (Signed)
Addended by: France Ravens I on: 02/16/2017 02:03 PM   Modules accepted: Orders

## 2017-02-16 NOTE — Telephone Encounter (Signed)
Rx's awaiting RAS sig/fim 

## 2017-03-04 ENCOUNTER — Ambulatory Visit (INDEPENDENT_AMBULATORY_CARE_PROVIDER_SITE_OTHER): Payer: BLUE CROSS/BLUE SHIELD | Admitting: Otolaryngology

## 2017-03-04 DIAGNOSIS — H6121 Impacted cerumen, right ear: Secondary | ICD-10-CM | POA: Diagnosis not present

## 2017-03-04 DIAGNOSIS — H903 Sensorineural hearing loss, bilateral: Secondary | ICD-10-CM

## 2017-03-04 DIAGNOSIS — H6983 Other specified disorders of Eustachian tube, bilateral: Secondary | ICD-10-CM

## 2017-03-05 ENCOUNTER — Ambulatory Visit: Payer: BLUE CROSS/BLUE SHIELD | Admitting: Neurology

## 2017-03-08 DIAGNOSIS — R0602 Shortness of breath: Secondary | ICD-10-CM | POA: Diagnosis not present

## 2017-03-11 ENCOUNTER — Other Ambulatory Visit: Payer: Self-pay | Admitting: Family Medicine

## 2017-03-11 DIAGNOSIS — Z1231 Encounter for screening mammogram for malignant neoplasm of breast: Secondary | ICD-10-CM

## 2017-03-25 ENCOUNTER — Ambulatory Visit (HOSPITAL_COMMUNITY): Payer: Self-pay

## 2017-03-29 ENCOUNTER — Encounter: Payer: Self-pay | Admitting: Family Medicine

## 2017-03-30 ENCOUNTER — Other Ambulatory Visit: Payer: Self-pay | Admitting: Family Medicine

## 2017-03-30 MED ORDER — ACYCLOVIR 400 MG PO TABS: 400.0000 mg | ORAL_TABLET | Freq: Three times a day (TID) | ORAL | 1 refills | Status: DC

## 2017-03-31 ENCOUNTER — Encounter (HOSPITAL_COMMUNITY): Payer: Self-pay | Admitting: Psychiatry

## 2017-03-31 ENCOUNTER — Ambulatory Visit (HOSPITAL_COMMUNITY): Payer: BLUE CROSS/BLUE SHIELD | Admitting: Psychiatry

## 2017-03-31 DIAGNOSIS — F331 Major depressive disorder, recurrent, moderate: Secondary | ICD-10-CM

## 2017-03-31 NOTE — Progress Notes (Signed)
THERAPIST PROGRESS NOTE  Session Time:    Wednesday 03/31/2017 10:08 AM - 11:05 AM   Participation Level: Active  Behavioral Response: CasualAlertAnxious and Depressed                    Type of Therapy: Individual Therapy        Treatment Goals:  1. Learn and implement coping and behavioral strategies to overcome depression.     2. Identify and replace thoughts and beliefs that support depression.         Treatment Goals addressed: 1,2  Interventions: Supportive,  CBT  Summary: Pamela Walters is a 55 y.o. female who is referred for services by PCP Dr. Moshe Cipro due to patient suffering symptoms of depression. Patient reports several family issues.  She reports becoming very stressed and depressed last year when her 88 year old granddaughter attempted suicide. She continues to worry about granddaughter who resides with patient's daughter who did not reveal to patient that granddaughter was having any problems until the suicide attempt occurred.  Patient reports strained relationship with daughter as she and her husband have raised her daughter's oldest two children. Her daughter's third child was raised by another relative. Daughter has made poor choices and continues to to have difficulty making responsible choices which leads to constant tension in the relationship between patient and daughter per patient's report.  Patient's daughter lost her job due to legal issues and has been unable to find a job. Patient reports stress related to daughter now constantly asking for money. 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   Patient resumed services around the beginning of June 2017 after not being in treatment for a year. She reports doing well until shortly after Christmas  2017 when she began to experience increased stress. Stressors include having heart catheterization in January 2018, son having a depressive episode  in April 2018, ongoing issues with her daughter, working excessive and unpredictable hours as employer is short staffed, and patient along with her husband adjusting to being empty nesters after 42 years now that her granddaughter has moved from their home. Patient's current symptoms include feeling down, loss of appetite, difficulty falling/staying asleep, loss of pleasure, diminished interest, poor motivation, poor concentration, and memory difficulty. She admits feelings of hopelessness but denies any SI. She reports anxiety comes and goes. Patient also experiences inappropriate guilt regarding son suffering from depression.           Patient last was seen in August 2018. She reports various situations prevented her from scheduling an earlier appointment. [place] stayed with patient for two weeks. She reports husband had pnuemonia. Also during this same time,  her son-in-law was hospitalized. She reports continued stress regarding daughter's behavior and expresses frustration regarding the effects on patient's marriage and life. She reports continued anxiety, excessive worry, fatigue, and anger. However, she reports feeling a little less depressed as she states she thinks the Cymbalta is starting to work.  Therapist Response:   Reviewed symptoms, discussed stressors, facilitated expression of thoughts and feelings, assisted patient identify realistic expectations of the relationship with her daughter by replacing unrealistic demands with wishes and desires, assisted areas where and how patient can change regarding her interaction with her daughter, assisted patient identify ways  to improve assertive communication with her husband regarding her concerns about the effects of daughter's behavior on their marriage  Plan: Return again in 2 weeks.   Diagnosis: Axis I: Major Depressive Disorder, Recurrent, Moderate    Axis II: No  diagnosis    Sheleen Conchas, LCSW 03/31/2017

## 2017-04-12 ENCOUNTER — Ambulatory Visit (INDEPENDENT_AMBULATORY_CARE_PROVIDER_SITE_OTHER): Payer: Self-pay | Admitting: Otolaryngology

## 2017-04-14 ENCOUNTER — Ambulatory Visit (HOSPITAL_COMMUNITY): Payer: BLUE CROSS/BLUE SHIELD | Admitting: Psychiatry

## 2017-04-22 ENCOUNTER — Other Ambulatory Visit: Payer: Self-pay | Admitting: Internal Medicine

## 2017-04-22 ENCOUNTER — Other Ambulatory Visit: Payer: Self-pay | Admitting: Family Medicine

## 2017-04-22 DIAGNOSIS — I10 Essential (primary) hypertension: Secondary | ICD-10-CM

## 2017-04-22 DIAGNOSIS — J3089 Other allergic rhinitis: Secondary | ICD-10-CM

## 2017-04-28 ENCOUNTER — Ambulatory Visit (HOSPITAL_COMMUNITY): Payer: BLUE CROSS/BLUE SHIELD | Admitting: Psychiatry

## 2017-04-28 ENCOUNTER — Encounter (HOSPITAL_COMMUNITY): Payer: Self-pay | Admitting: Psychiatry

## 2017-04-28 DIAGNOSIS — F331 Major depressive disorder, recurrent, moderate: Secondary | ICD-10-CM | POA: Diagnosis not present

## 2017-04-28 NOTE — Progress Notes (Signed)
                   THERAPIST PROGRESS NOTE  Session Time:    Wednesday 04/28/2017 11:05 AM - 12:00 PM  Participation Level: Active  Behavioral Response: CasualAlertAnxious and Depressed /tearful                   Type of Therapy: Individual Therapy        Treatment Goals:  1. Learn and implement coping and behavioral strategies to overcome depression.     2. Identify and replace thoughts and beliefs that support depression.         Treatment Goals addressed: 1,2  Interventions: Supportive,  CBT  Summary: Pamela Walters is a 57 y.o. female who is referred for services by PCP Dr. Moshe Cipro due to patient suffering symptoms of depression. Patient has been seen in the office interminttently for medication management and outpatient psychotherapy since 2016. She recently resumed services and was last seen 4 weeks ago. She reports increased depressed mood, poor motivation, anxiety, decreased interest in activities, diminished pleasure in activities, lack of appetite, excessive worry, and fatigue since last session. Patient also reports increased worry about having another panic attack as she had one about 2 months ago. She expresses frustration with self as she was not able to enjoy the holidays as she has in the past. She continues to do she of work and reports inconsistent medication compliance. She also reports she has not been practicing relaxation techniques consistently.            Therapist Response:   Reviewed symptoms, assisted patient identify triggers of increased symptoms of depression and anxiety, discuss the importance of medication compliance and advise patient to schedule an appointment with psychiatrist Dr. Harrington Challenger for medication management, discussed stressors, facilitated expression of thoughts and feelings, discussed the stress response and ways to trigger a relaxation response, reviewed deep breathing, discussed and practiced progressive muscle relaxation, assigned patient to  practice relaxation techniques daily,    Plan: Return again in 2 weeks.   Diagnosis: Axis I: Major Depressive Disorder, Recurrent, Moderate    Axis II: No diagnosis    Barbarann Kelly, LCSW 04/28/2017

## 2017-04-29 ENCOUNTER — Other Ambulatory Visit: Payer: Self-pay | Admitting: Neurology

## 2017-05-04 IMAGING — CT CT ABD-PEL WO/W CM
3 of 12 series · 12 of 46 positions shown, 18 images · IV contrast (iopamidol)
Comparison: None.

CLINICAL DATA: Microscopic hematuria, frequent UTIs, foul-smelling
urine.

EXAM:
CT ABDOMEN AND PELVIS WITHOUT AND WITH CONTRAST
TECHNIQUE: Multidetector CT imaging of the abdomen and pelvis was performed
following the standard protocol before and following the bolus
administration of intravenous contrast.
CONTRAST:  125mL N1GX5C-7PP IOPAMIDOL (N1GX5C-7PP) INJECTION 61%

[Series 3: axial pre · axial · non-contrast · 0.86mm/px · z∈[+925,+1285]mm · 7 of 98 slices shown, 12 images]
[im 13/98  soft-tissue]
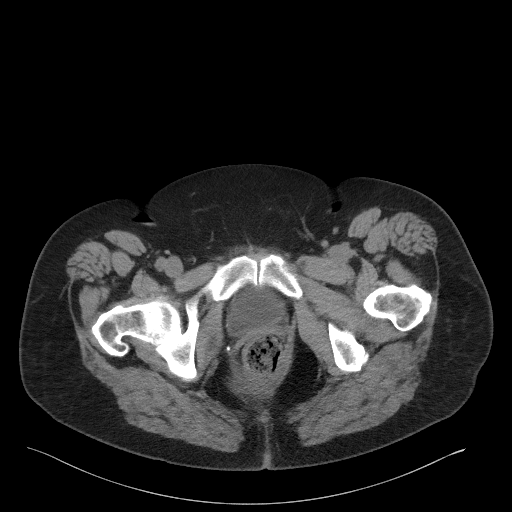
[im 13/98  bone]
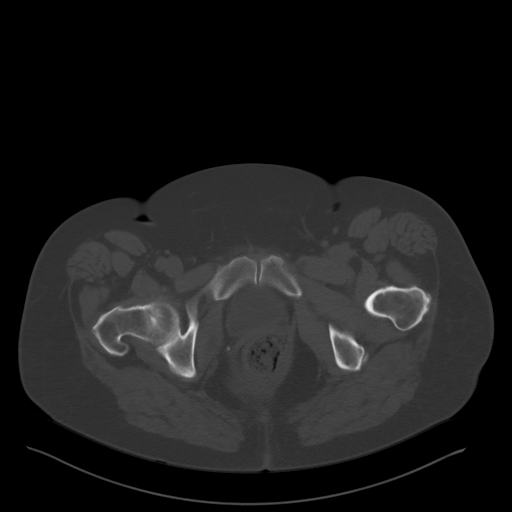
[im 25/98  soft-tissue]
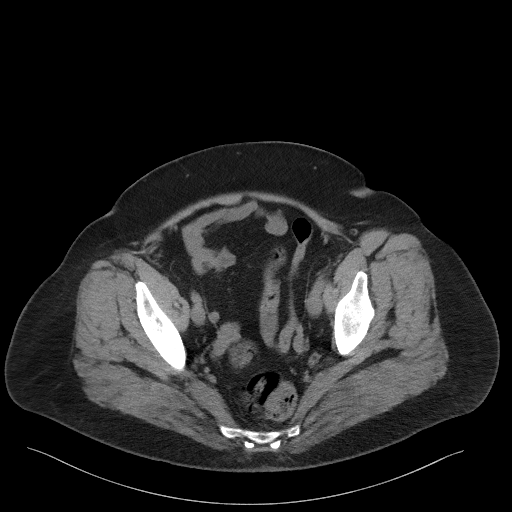
[im 37/98  soft-tissue]
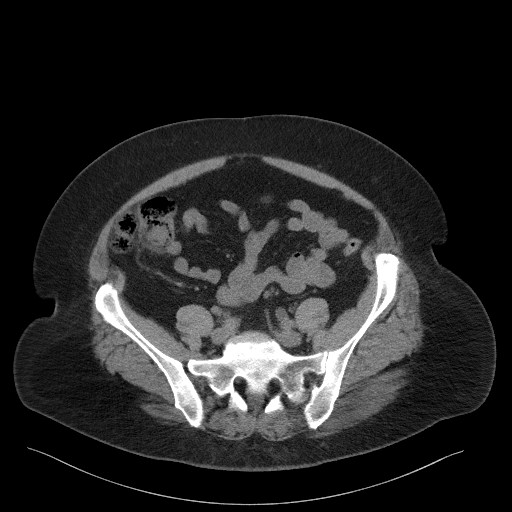
[im 49/98  soft-tissue]
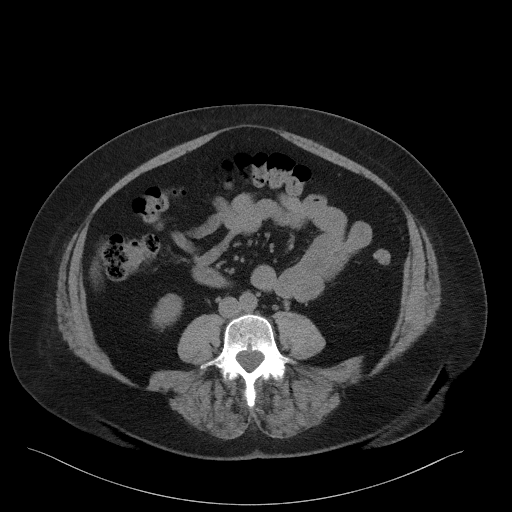
[im 49/98  lung]
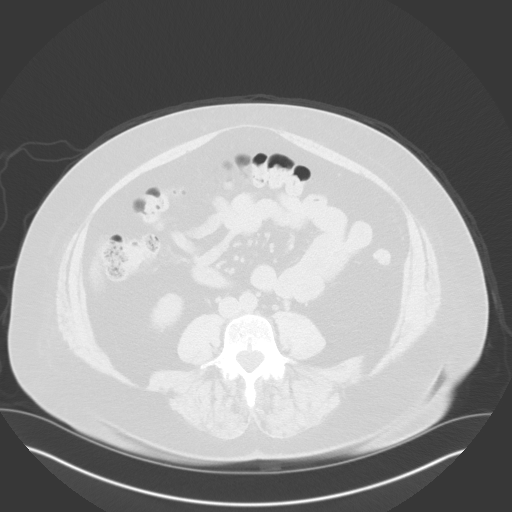
[im 61/98  soft-tissue]
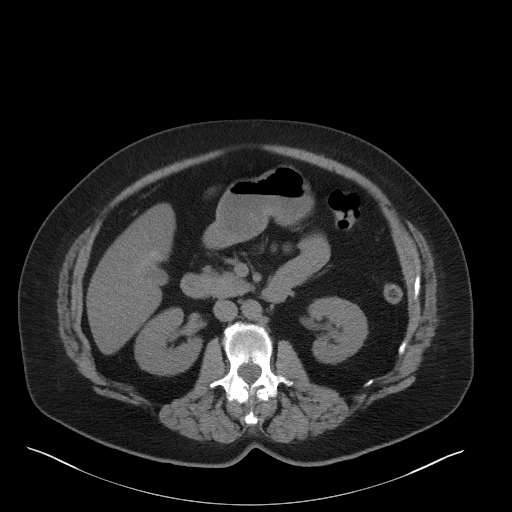
[im 61/98  lung]
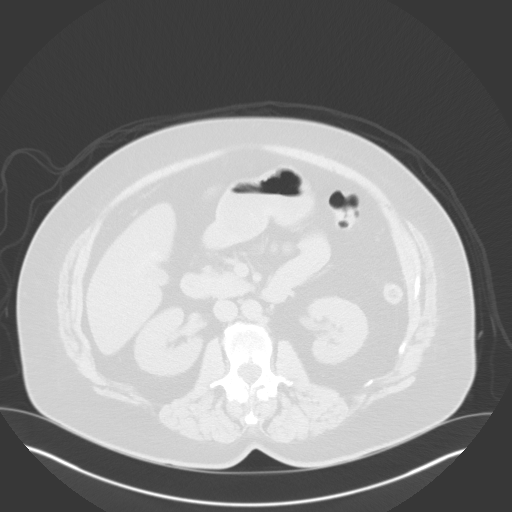
[im 73/98  soft-tissue]
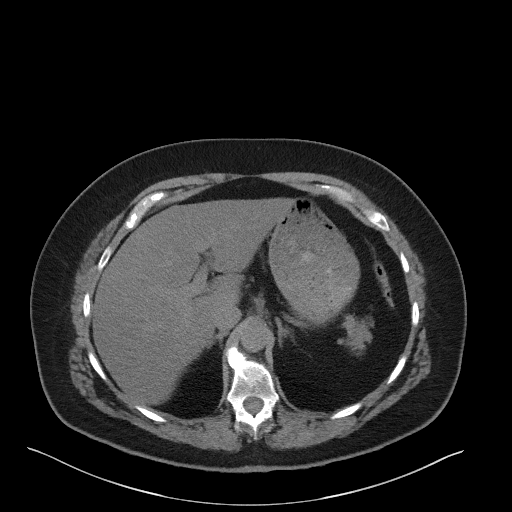
[im 73/98  lung]
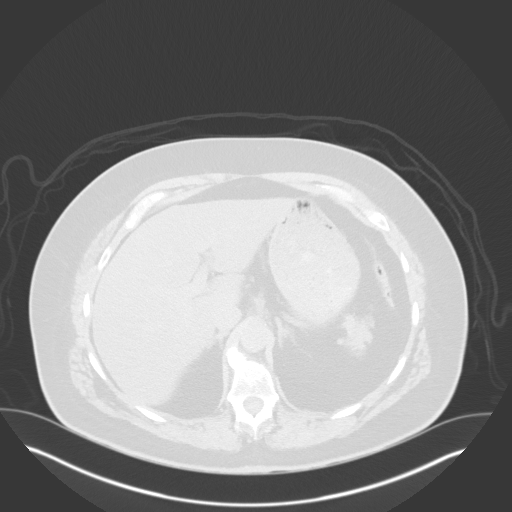
[im 85/98  soft-tissue]
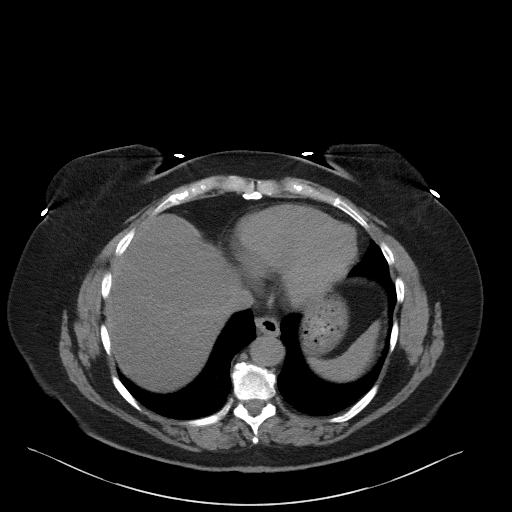
[im 85/98  lung]
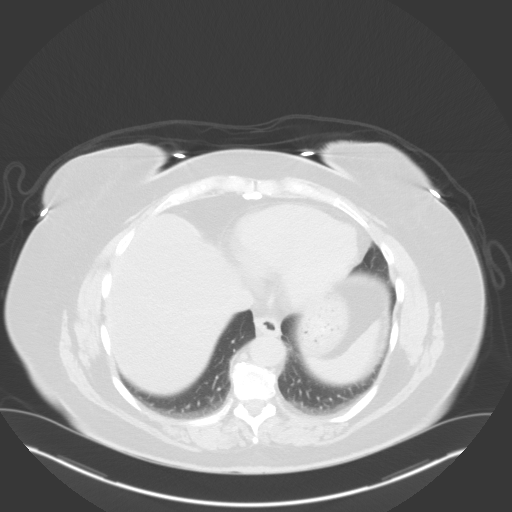

[Series 4: axial post · axial · 0.86mm/px · z∈[+930,+1070]mm · 3 of 99 slices shown]
[im 15/99  soft-tissue]
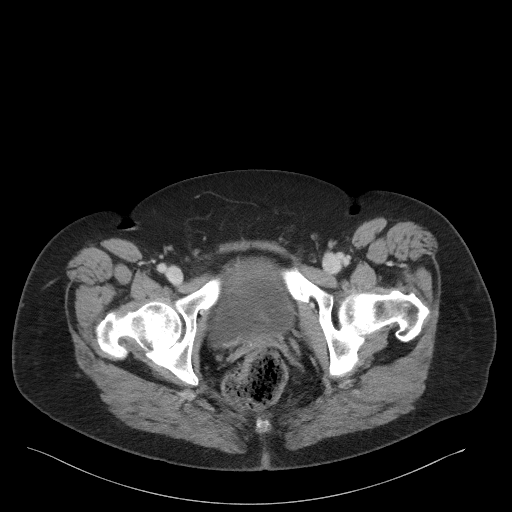
[im 29/99  soft-tissue]
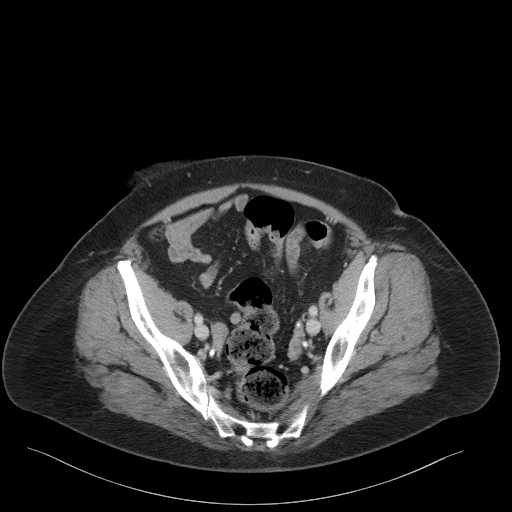
[im 43/99  soft-tissue]
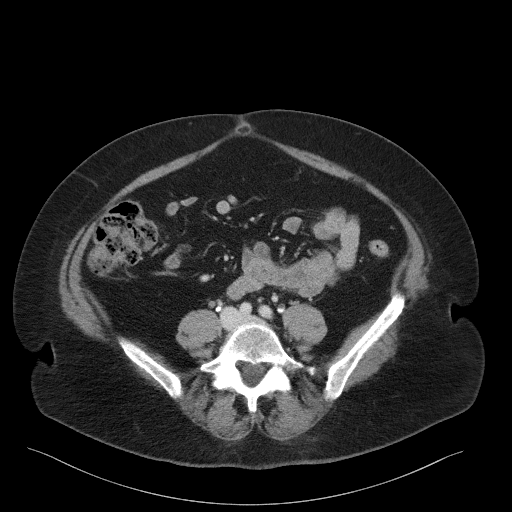

[Series 12: coronal pre · coronal · non-contrast · 0.74mm/px · 2 of 116 slices shown, 3 images]
[im 39/116  soft-tissue]
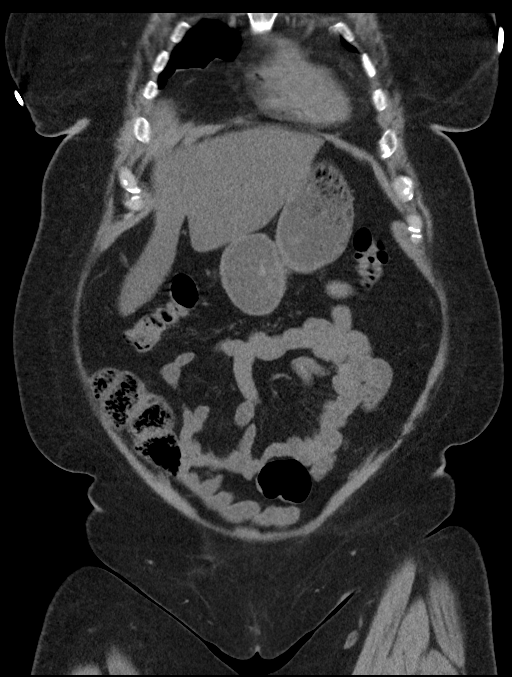
[im 39/116  bone]
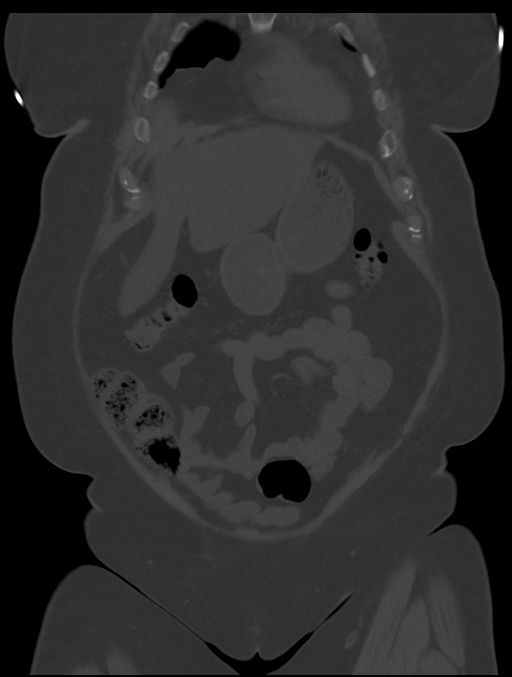
[im 77/116  soft-tissue]
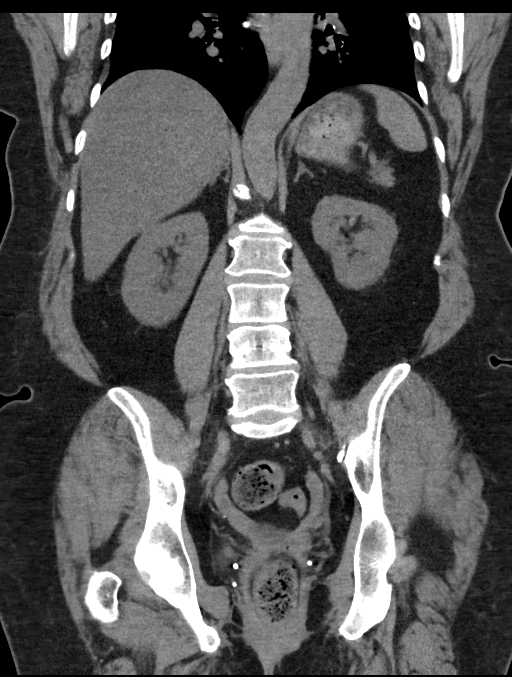

[12 of 46 positions shown; findings below may reference images not displayed]

FINDINGS: Lower chest: Lung bases show no acute findings. Heart size within
normal limits. No pericardial or pleural effusion.

Hepatobiliary: Liver is decreased in attenuation diffusely. Liver
and gallbladder are otherwise unremarkable. No biliary ductal
dilatation.

Pancreas: Negative.

Spleen: Negative.

Adrenals/Urinary Tract: Adrenal glands are unremarkable.
Subcentimeter low-attenuation lesions in the kidneys are too small
to characterize but statistically, cysts are most likely. There may
be a punctate stone in the lower pole left kidney. Ureters are
decompressed. Proximal and distal portions of the left ureter are
poorly opacified, limiting evaluation. Otherwise, no filling defects
in the visualized portions of the intrarenal collecting systems,
ureters or bladder.

Stomach/Bowel: Stomach, small bowel, appendix and colon are
unremarkable.

Vascular/Lymphatic: Minimal atherosclerotic calcification of the
aorta. Circumaortic left renal vein. No pathologically enlarged
lymph nodes.

Reproductive: Hysterectomy.  No adnexal mass.

Other: No free fluid.  Mesenteries and peritoneum are unremarkable.

Musculoskeletal: No worrisome lytic or sclerotic lesions. Minimal
grade 1 anterolisthesis of L4 on L5.
IMPRESSION: 1. Portions of the proximal and distal left ureter are poorly
opacified, limiting evaluation. Punctate left renal stone. No
additional findings to explain the patient's symptoms.
2. Hepatic steatosis.
3.  Aortic atherosclerosis ([id number]-170.0).

## 2017-05-06 ENCOUNTER — Ambulatory Visit: Payer: Self-pay | Admitting: Family Medicine

## 2017-05-06 ENCOUNTER — Ambulatory Visit (INDEPENDENT_AMBULATORY_CARE_PROVIDER_SITE_OTHER): Payer: Self-pay | Admitting: Otolaryngology

## 2017-05-10 ENCOUNTER — Ambulatory Visit (HOSPITAL_COMMUNITY): Payer: BLUE CROSS/BLUE SHIELD | Admitting: Psychiatry

## 2017-05-10 ENCOUNTER — Telehealth: Payer: Self-pay

## 2017-05-10 ENCOUNTER — Encounter (HOSPITAL_COMMUNITY): Payer: Self-pay | Admitting: Psychiatry

## 2017-05-10 VITALS — BP 163/87 | HR 83 | Ht 63.0 in | Wt 178.0 lb

## 2017-05-10 DIAGNOSIS — R450 Nervousness: Secondary | ICD-10-CM | POA: Diagnosis not present

## 2017-05-10 DIAGNOSIS — I10 Essential (primary) hypertension: Secondary | ICD-10-CM | POA: Diagnosis not present

## 2017-05-10 DIAGNOSIS — F331 Major depressive disorder, recurrent, moderate: Secondary | ICD-10-CM | POA: Diagnosis not present

## 2017-05-10 DIAGNOSIS — E785 Hyperlipidemia, unspecified: Secondary | ICD-10-CM | POA: Diagnosis not present

## 2017-05-10 DIAGNOSIS — F419 Anxiety disorder, unspecified: Secondary | ICD-10-CM | POA: Diagnosis not present

## 2017-05-10 DIAGNOSIS — M549 Dorsalgia, unspecified: Secondary | ICD-10-CM

## 2017-05-10 DIAGNOSIS — Z818 Family history of other mental and behavioral disorders: Secondary | ICD-10-CM

## 2017-05-10 MED ORDER — DULOXETINE HCL 60 MG PO CPEP: 60.0000 mg | ORAL_CAPSULE | Freq: Every day | ORAL | 2 refills | Status: DC

## 2017-05-10 MED ORDER — CLONAZEPAM 0.5 MG PO TABS: 0.5000 mg | ORAL_TABLET | Freq: Every day | ORAL | 2 refills | Status: DC | PRN

## 2017-05-10 NOTE — Telephone Encounter (Signed)
Lab orders printed

## 2017-05-10 NOTE — Progress Notes (Signed)
BH MD/PA/NP OP Progress Note  05/10/2017 8:52 AM Pamela Walters  MRN:  [id number]  Chief Complaint:  Chief Complaint    Depression; Anxiety; Follow-up     HPI: This patient is a 57 year old married white female who lives with her husband in [place].  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  The patient returns for follow-up of treatment for depression and anxiety.  She was last seen in August and was switched from Prozac to Cymbalta.  She did not feel like the Prozac was helping that much anymore and she was having tonic pain.  She states she had been taking the Cymbalta consistently because of her work schedule she kept forgetting.  However back in November she was out with her family and had a severe panic attack and had to go to the hospital emergency room for workup.  Nothing really showed up but this scared her.  For the last couple of weeks she has been taking the Cymbalta every day and she is starting to feel better.  She still has periods of feeling shaky and jittery even in her own home.  She is generally sleeping well.  Her neurologist is still following her MS and she is going to have repeat brain scan in the next month.  She occasionally has symptoms like numbness in her hands and feet and gets a bit off balance when she is tired.  She is still able to work.  The patient denies being suicidal but does admit she has been having a lot more anxiety.  She still has menopausal symptoms such as hot flashes which may be contributing.  I suggested we add a low-dose benzodiazepine and she is in agreement.  She continues her counseling here with Maurice Small Visit Diagnosis:    ICD-10-CM   1. Major depressive disorder, recurrent episode, moderate (HCC) F33.1     Past Psychiatric History: None  Past Medical History:  Past Medical History:  Diagnosis Date  . Allergy   . Back pain   . Depression   . GERD (gastroesophageal reflux disease)   . Hepatic steatosis   .  Hiatal hernia   . Hyperlipidemia   . Hypertension   . Internal hemorrhoids   . MS (multiple sclerosis) ([place])    2007  . Multiple sclerosis ([place])   . Schatzki's ring   . Sleep apnea    wears CPAP    Past Surgical History:  Procedure Laterality Date  . ABDOMINAL HYSTERECTOMY  2005   fibroids  . CARDIAC CATHETERIZATION N/A 05/04/2016   Procedure: Left Heart Cath and Coronary Angiography;  Surgeon: Jettie Booze, MD;  Location: [facility];  Service: Cardiovascular;  Laterality: N/A;  . ESOPHAGOGASTRODUODENOSCOPY N/A 09/18/2013   Procedure: ESOPHAGOGASTRODUODENOSCOPY (EGD);  Surgeon: Jerene Bears, MD;  Location: [facility];  Service: Gastroenterology;  Laterality: N/A;  . UPPER GASTROINTESTINAL ENDOSCOPY    . WISDOM TOOTH EXTRACTION      Family Psychiatric History: Below  Family History:  Family History  Problem Relation Age of Onset  . Hypertension Mother   . Hyperlipidemia Mother   . Depression Mother   . Hypertension Father   . Colon cancer Neg Hx   . Esophageal cancer Neg Hx   . Rectal cancer Neg Hx   . Stomach cancer Neg Hx     Social History:  Social History   Socioeconomic History  . Marital status: Married    Spouse name: None  .  Number of children: None  . Years of education: None  . Highest education level: None  Social Needs  . Financial resource strain: None  . Food insecurity - worry: None  . Food insecurity - inability: None  . Transportation needs - medical: None  . Transportation needs - non-medical: None  Occupational History  . None  Tobacco Use  . Smoking status: Never Smoker  . Smokeless tobacco: Never Used  Substance and Sexual Activity  . Alcohol use: No  . Drug use: No  . Sexual activity: Yes    Birth control/protection: Surgical  Other Topics Concern  . None  Social History Narrative  . None    Allergies: No Known Allergies  Metabolic Disorder Labs: Lab Results  Component Value Date   HGBA1C 5.4 02/20/2016    MPG 108 02/20/2016   MPG 123 (H) 05/09/2015   No results found for: PROLACTIN Lab Results  Component Value Date   CHOL 273 (H) 01/29/2017   TRIG 234 (H) 01/29/2017   HDL 36 (L) 01/29/2017   CHOLHDL 7.6 (H) 01/29/2017   VLDL 34 (H) 06/30/2016   LDLCALC 142 (H) 06/30/2016   LDLCALC 145 (H) 02/20/2016   Lab Results  Component Value Date   TSH 4.02 02/20/2016   TSH 2.732 05/17/2014    Therapeutic Level Labs: No results found for: LITHIUM No results found for: VALPROATE No components found for:  CBMZ  Current Medications: Current Outpatient Medications  Medication Sig Dispense Refill  . acyclovir (ZOVIRAX) 400 MG tablet Take 1 tablet (400 mg total) by mouth 3 (three) times daily. 21 tablet 1  . Armodafinil 250 MG tablet Take 1 tablet (250 mg total) by mouth daily. 30 tablet 5  . aspirin 81 MG tablet Take 81 mg by mouth daily.    . DULoxetine (CYMBALTA) 60 MG capsule Take 1 capsule (60 mg total) by mouth daily. 30 capsule 2  . ezetimibe (ZETIA) 10 MG tablet Take 1 tablet (10 mg total) by mouth daily. 30 tablet 5  . fluconazole (DIFLUCAN) 150 MG tablet One tablet once daily , as needed, for vaginal itch associated with prolonged antibiotic use 3 tablet 0  . folic acid-pyridoxine-cyancobalamin (FOLTX) 2.5-25-2 MG TABS tablet Take 1 tablet by mouth daily. 90 each 3  . gabapentin (NEURONTIN) 600 MG tablet Take 1 tablet (600 mg total) by mouth 3 (three) times daily. 270 tablet 3  . ibuprofen (ADVIL,MOTRIN) 200 MG tablet Take 800 mg by mouth every 6 (six) hours as needed for headache or moderate pain.    . isosorbide mononitrate (IMDUR) 30 MG 24 hr tablet Take 0.5 tablets (15 mg total) by mouth daily. 15 tablet 3  . levofloxacin (LEVAQUIN) 500 MG tablet Take 1 tablet (500 mg total) by mouth daily. 14 tablet 0  . metaxalone (SKELAXIN) 800 MG tablet Take 800 mg by mouth 2 (two) times daily as needed for muscle spasms.     . mometasone (NASONEX) 50 MCG/ACT nasal spray USE 2 SPRAYS NASALLY  DAILY 51 g 1  . Multiple Vitamin (MULTIVITAMIN WITH MINERALS) TABS tablet Take 1 tablet by mouth daily.    Marland Kitchen oxybutynin (DITROPAN-XL) 10 MG 24 hr tablet Take 1 tablet (10 mg total) by mouth at bedtime. 90 tablet 3  . pantoprazole (PROTONIX) 40 MG tablet TAKE 1 TABLET BY MOUTH  EVERY MORNING 90 tablet 0  . ranitidine (ZANTAC) 150 MG tablet Take 1 tablet (150 mg total) by mouth at bedtime. 90 tablet 0  . rosuvastatin (CRESTOR) 40  MG tablet Take 1 tablet (40 mg total) by mouth daily. 90 tablet 3  . traMADol (ULTRAM) 50 MG tablet 1-2 pills as needed up to 4/day 90 tablet 3  . triamterene-hydrochlorothiazide (MAXZIDE-25) 37.5-25 MG tablet Take 1 tablet by mouth daily. 90 tablet 1  . clonazePAM (KLONOPIN) 0.5 MG tablet Take 1 tablet (0.5 mg total) by mouth daily as needed for anxiety. 30 tablet 2  . metoprolol tartrate (LOPRESSOR) 25 MG tablet Take 0.5 tablets (12.5 mg total) by mouth 2 (two) times daily. 90 tablet 3   Current Facility-Administered Medications  Medication Dose Route Frequency Provider Last Rate Last Dose  . 0.9 %  sodium chloride infusion  500 mL Intravenous Continuous Pyrtle, Lajuan Lines, MD      . 0.9 %  sodium chloride infusion  500 mL Intravenous Continuous Pyrtle, Lajuan Lines, MD       Facility-Administered Medications Ordered in Other Visits  Medication Dose Route Frequency Provider Last Rate Last Dose  . gadopentetate dimeglumine (MAGNEVIST) injection 20 mL  20 mL Intravenous Once PRN Sater, Nanine Means, MD         Musculoskeletal: Strength & Muscle Tone: within normal limits Gait & Station: normal Patient leans: N/A  Psychiatric Specialty Exam: Review of Systems  Constitutional: Positive for malaise/fatigue.  Musculoskeletal: Positive for back pain.  Neurological: Positive for tingling and focal weakness.  Psychiatric/Behavioral: The patient is nervous/anxious.   All other systems reviewed and are negative.   Blood pressure (!) 163/87, pulse 83, height 5\' 3"  (1.6 m), weight  178 lb (80.7 kg), SpO2 97 %.Body mass index is 31.53 kg/m.  General Appearance: Casual and Fairly Groomed  Eye Contact:  Good  Speech:  Clear and Coherent  Volume:  Normal  Mood:  Anxious  Affect:  Congruent  Thought Process:  Goal Directed  Orientation:  Full (Time, Place, and Person)  Thought Content: Rumination   Suicidal Thoughts:  No  Homicidal Thoughts:  No  Memory:  Immediate;   Good Recent;   Good Remote;   Fair  Judgement:  Good  Insight:  Fair  Psychomotor Activity:  Normal  Concentration:  Concentration: Good and Attention Span: Good  Recall:  Good  Fund of Knowledge: Good  Language: Good  Akathisia:  No  Handed:  Right  AIMS (if indicated): not done  Assets:  Communication Skills Desire for Improvement Resilience Social Support Talents/Skills Vocational/Educational  ADL's:  Intact  Cognition: WNL  Sleep:  Good   Screenings: PHQ2-9     Office Visit from 07/02/2016 in [facility] Office Visit from 02/08/2014 in [facility] Office Visit from 03/17/2013 in [facility] Office Visit from 01/30/2013 in [facility]  PHQ-2 Total Score  0  6  2  6   PHQ-9 Total Score  No data  20  8  20        Assessment and Plan: This patient is a 57 year old female with a history of multiple sclerosis, mild, depression and anxiety.  Her mood has been better the last several weeks and she has been taking the Cymbalta more consistently.  I have also given her clonazepam 0.5 mg to use daily if absolutely needed for anxiety.  She will continue her therapy with Maurice Small and return to see me in 3 months or call sooner if needed   [name], MD 05/10/2017, 8:52 AM

## 2017-05-11 LAB — HEMOGLOBIN A1C
EAG (MMOL/L): 6.2 (calc)
Hgb A1c MFr Bld: 5.5 % of total Hgb (ref <5.7)
Mean Plasma Glucose: 111 (calc)

## 2017-05-11 LAB — COMPLETE METABOLIC PANEL WITH GFR
AG Ratio: 1.6 (calc) (ref 1.0–2.5)
ALBUMIN MSPROF: 4.6 g/dL (ref 3.6–5.1)
ALKALINE PHOSPHATASE (APISO): 72 U/L (ref 33–130)
ALT: 16 U/L (ref 6–29)
AST: 20 U/L (ref 10–35)
BUN: 18 mg/dL (ref 7–25)
CHLORIDE: 102 mmol/L (ref 98–110)
CO2: 31 mmol/L (ref 20–32)
Calcium: 10.1 mg/dL (ref 8.6–10.4)
Creat: 0.86 mg/dL (ref 0.50–1.05)
GFR, Est African American: 86 mL/min/{1.73_m2} (ref >=60)
GFR, Est Non African American: 74 mL/min/{1.73_m2} (ref >=60)
GLUCOSE: 105 mg/dL — AB (ref 65–99)
Globulin: 2.9 g/dL (calc) (ref 1.9–3.7)
Potassium: 3.8 mmol/L (ref 3.5–5.3)
Sodium: 140 mmol/L (ref 135–146)
Total Bilirubin: 0.4 mg/dL (ref 0.2–1.2)
Total Protein: 7.5 g/dL (ref 6.1–8.1)

## 2017-05-11 LAB — CBC
HCT: 37.8 % (ref 35.0–45.0)
Hemoglobin: 13 g/dL (ref 11.7–15.5)
MCH: 26.7 pg — ABNORMAL LOW (ref 27.0–33.0)
MCHC: 34.4 g/dL (ref 32.0–36.0)
MCV: 77.6 fL — ABNORMAL LOW (ref 80.0–100.0)
MPV: 9.3 fL (ref 7.5–12.5)
PLATELETS: 403 10*3/uL — AB (ref 140–400)
RBC: 4.87 10*6/uL (ref 3.80–5.10)
RDW: 15.6 % — ABNORMAL HIGH (ref 11.0–15.0)
WBC: 10.6 10*3/uL (ref 3.8–10.8)

## 2017-05-11 LAB — LIPID PANEL
CHOL/HDL RATIO: 2.9 (calc) (ref <5.0)
CHOLESTEROL: 138 mg/dL (ref <200)
HDL: 47 mg/dL — ABNORMAL LOW (ref >50)
LDL CHOLESTEROL (CALC): 71 mg/dL
Non-HDL Cholesterol (Calc): 91 mg/dL (calc) (ref <130)
TRIGLYCERIDES: 123 mg/dL (ref <150)

## 2017-05-11 LAB — TSH: TSH: 2.36 mIU/L (ref 0.40–4.50)

## 2017-05-11 LAB — VITAMIN D 25 HYDROXY (VIT D DEFICIENCY, FRACTURES): VIT D 25 HYDROXY: 33 ng/mL (ref 30–100)

## 2017-05-12 ENCOUNTER — Encounter (HOSPITAL_COMMUNITY): Payer: Self-pay | Admitting: Psychiatry

## 2017-05-12 ENCOUNTER — Ambulatory Visit (HOSPITAL_COMMUNITY): Payer: BLUE CROSS/BLUE SHIELD | Admitting: Psychiatry

## 2017-05-12 DIAGNOSIS — F331 Major depressive disorder, recurrent, moderate: Secondary | ICD-10-CM | POA: Diagnosis not present

## 2017-05-12 NOTE — Progress Notes (Signed)
                   THERAPIST PROGRESS NOTE      Session Time:    Wednesday 05/12/2017 11:04 AM - 11:58 AM   Participation Level: Active  Behavioral Response: CasualAlert/ improved mood, less anxious                 Type of Therapy: Individual Therapy        Treatment Goals:  1. Learn and implement coping and behavioral strategies to overcome depression.     2. Identify and replace thoughts and beliefs that support depression.         Treatment Goals addressed: 1,2  Interventions: Supportive,  CBT  Summary: Pamela Walters is a 57 y.o. female who is referred for services by PCP Dr. Moshe Cipro due to patient suffering symptoms of depression. Patient has been seen in the office interminttently for medication management and outpatient psychotherapy since 2016. She recently resumed services and was last seen 4 weeks ago. She reports increased depressed mood, poor motivation, anxiety, decreased interest in activities, diminished pleasure in activities, lack of appetite, excessive worry, and fatigue since last session. Patient also reports increased worry about having another panic attack as she had one about 2 months ago. She expresses frustration with self as she was not able to enjoy the holidays as she has in the past. 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 She also reports she has not been practicing relaxation techniques consistently.      Patient last was seen 2 weeks ago. She states feeling much better since last session and says her mind has been more clear. She has been doing more things like household chores and projects rather than just lying around. She also reports increased motivation and experiencing pleasure and interest in activities. She reports enjoying recent weekend trip to the beach to celebrate a birthday with her granddaughter.  She reports consistent medication compliance and practicing relaxation techniques daily. She has resumed  seeing psychiatrist Dr. Harrington Challenger for medication management. She continues to experience worry about the possibility of having another panic attack. She has had 2 episodes in which she began experiencing beginning symptoms of a panic attack since last session.        Therapist Response:   Reviewed symptoms, praises and reinforced patient's improved medication compliance and consistent practice of relaxation techniques, discuss effects on patient's mood and behavior, praise and reinforced patient's increased involvement in activity, provided psychoeducation regarding panic attacks including identify the panic cycle and ways to manage, assigned patient to practice relaxation techniques daily,    Plan: Return again in 2 weeks.   Diagnosis: Axis I: Major Depressive Disorder, Recurrent, Moderate    Axis II: No diagnosis    Darrie Macmillan, LCSW 05/12/2017

## 2017-05-13 DIAGNOSIS — M545 Low back pain: Secondary | ICD-10-CM | POA: Diagnosis not present

## 2017-05-13 DIAGNOSIS — S233XXA Sprain of ligaments of thoracic spine, initial encounter: Secondary | ICD-10-CM | POA: Diagnosis not present

## 2017-05-13 DIAGNOSIS — M47816 Spondylosis without myelopathy or radiculopathy, lumbar region: Secondary | ICD-10-CM | POA: Diagnosis not present

## 2017-05-17 DIAGNOSIS — M47816 Spondylosis without myelopathy or radiculopathy, lumbar region: Secondary | ICD-10-CM | POA: Diagnosis not present

## 2017-05-17 DIAGNOSIS — S233XXA Sprain of ligaments of thoracic spine, initial encounter: Secondary | ICD-10-CM | POA: Diagnosis not present

## 2017-05-17 DIAGNOSIS — M545 Low back pain: Secondary | ICD-10-CM | POA: Diagnosis not present

## 2017-05-19 ENCOUNTER — Other Ambulatory Visit: Payer: Self-pay

## 2017-05-19 ENCOUNTER — Encounter: Payer: Self-pay | Admitting: Neurology

## 2017-05-19 ENCOUNTER — Ambulatory Visit: Payer: BLUE CROSS/BLUE SHIELD | Admitting: Neurology

## 2017-05-19 VITALS — BP 132/69 | HR 85 | Resp 18 | Ht 63.0 in | Wt 183.5 lb

## 2017-05-19 DIAGNOSIS — G5603 Carpal tunnel syndrome, bilateral upper limbs: Secondary | ICD-10-CM

## 2017-05-19 DIAGNOSIS — G4733 Obstructive sleep apnea (adult) (pediatric): Secondary | ICD-10-CM

## 2017-05-19 DIAGNOSIS — R208 Other disturbances of skin sensation: Secondary | ICD-10-CM

## 2017-05-19 DIAGNOSIS — R9089 Other abnormal findings on diagnostic imaging of central nervous system: Secondary | ICD-10-CM | POA: Insufficient documentation

## 2017-05-19 DIAGNOSIS — R9082 White matter disease, unspecified: Secondary | ICD-10-CM

## 2017-05-19 MED ORDER — TRAMADOL HCL 50 MG PO TABS: ORAL_TABLET | ORAL | 3 refills | Status: DC

## 2017-05-19 MED ORDER — ARMODAFINIL 250 MG PO TABS: 250.0000 mg | ORAL_TABLET | Freq: Every day | ORAL | 5 refills | Status: DC

## 2017-05-19 MED ORDER — METHYLPREDNISOLONE 4 MG PO TABS: ORAL_TABLET | ORAL | 0 refills | Status: DC

## 2017-05-19 NOTE — Progress Notes (Signed)
[facility]  PATIENT: Pamela Walters DOB: 12/10/1960  REFERRING CLINICIAN: Tula Walters HISTORY FROM: Patient REASON FOR VISIT: Possible MS, fatigue, myalgias   HISTORICAL  CHIEF COMPLAINT:  Chief Complaint  Patient presents with  . Multiple Sclerosis  . Sleep Apnea    HISTORY OF PRESENT ILLNESS:  She is a 57 year old woman who has probable MS and obstructive sleep apnea  Update 05/19/2017: She denies any new neurologic symptoms/exacerbations. Last MRI performed 03/27/2015 was unchanged compared to the MRI from 02/09/2013. She has mild difficulty with her balance sometimes veering to the left or right while she walks. She denies any significant weakness. She has noted hand numbness the past few months.   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    She wore wrist splints for possible CTS years ago but never had any NCV/EMG.    She will sometimes have tingling helped by gabapentin.. She has urinary frequency and urgency that is helped by oxybutynin. She denies any difficulty with her vision.  She notes some fatigue.  Usually her fatigue will be worse when the temperatures are elevated. She has severe obstructive sleep apnea (AHI was 37) controlled on CPAP +9 cm.   She uses nightly for > 4 hours almost every night.   However, since she does third shifts, she has variable sleep.   Nuvigil helps at least 8 hours.   She was having more depression a couple months ago and rare anxiety attacks.   She was prescribed clonazepam but has not taken recently.    From 09/02/2016: MS:   She has been off of any disease modifying therapy since 2011 or so.    Her last MRI 03/27/2015 was unchanged compared to the MRI 02/09/2013.   She does not believe she has had any exacerbations.  Gait/strength/sensation:   She feels gait is the same -- she is mildly of balanced and veers some with walking.   Strength is good and she denies any significant spasticity.  She notes  no numbness but has some painful tingling in the legs at times    Bladder:  She has bladder frequency and urgency helped by oxybutynin.   She tolerates it well.     Vision:   She feels vision is stable.      She wears glasses.     No h/o optic neuritis  OSA/sleep:  She has severe OSA with an AHI equal 37. She is on CPAP +9 cm. She has been using CPAP every night but not always for the entire night.      When she wears CPAP the whole night, she does feel less tired the next day.  She sometimes falls asleep outside of the bed and then doesn't use CPAP the whole night.   She has also noted less nocturia when she uses CPAP.   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   She is sometimes sleepy during the day. time        Fatigue:   She notes fatigue many days even if not sleepy.   The fatigue is both physical and mental.   Fatigue is always worse when she is hot.    Fatigue has been a little better since starting CPAP.      Mood/cognitive:   She has had mood swings and some irritability but no crying spells.  She sees Dr. Sima Walters.   She is gaining weight on Abilify but current regimen is helping  mood a lot.   She notes decreased focus and attention.    Pain:   She reports myalgias in the legs. She gets some benefit from Skelaxin and hydrocodone and gabapentin.     She tolerates these well.   She has seen Dr. Rolena Walters and Dr. Nelva Walters for Pain.  She also has LBP from facet hypertrophy and spondylolisthesis at L4L5.     ESI injections have helped in past.    LBP is better  MS History: In  2006 and 2007 she presented with headaches. Headaches were bilateral and were associated with visual aura. Pain was throbbing and she had associated photophobia and phonophobia.  Pamela Walters underwent an MRI of the brain and an MR angiogram. The angiogram was normal but the MRI of the brain showed multiple white matter foci in a nonspecific pattern. She reports that Dr. Doy Walters did a lumbar puncture and she was told that  the results were abnormal, consistent with MS. She was placed on Betaseron but had difficulties tolerating it. She has not been on any DMT for her MS over the last 4 - 5 years.  She did not have any definite exacerbations.  Specifically, there were no episodes of numbness, weakness, clumsiness or visual change that came on over a short period of time.  A repeat MRI of the brain showed that the might of been some progression of the MRI changes. An MRI of the thoracic spine did not show any spinal lesions.    DATA :   I reviewed her MRI images from 12//2016.  She has multiple white matter lesions and the majority are round and either subcortical or in the deep white matter.  A couple smaller ones are in the periventricular white matter.   There is symmetric hyperintensity in the mid midbrain.   I reviewed recent labwork showing normal vasculitis labs but elevated homocysteine.     MRI of the thoracic spine at that time showed no spinal cord lesions. MRI of the lumbar spine shows degenerative changes at a worse at L4-L5. She has 4 mm of anterolisthesis at that level    REVIEW OF SYSTEMS:  Constitutional: No fevers, chills, sweats, or change in appetite.   She has fatigue Eyes: No double vision,.   She notes eye pain Ear, nose and throat: No hearing loss, ear pain, nasal congestion, sore throat Cardiovascular: No chest pain, palpitations Respiratory:  No shortness of breath at rest or with exertion.   No wheezes GastrointestinaI: No nausea, vomiting, diarrhea, abdominal pain, fecal incontinence.   She has a hiatal hernia.  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   Genitourinary:  reports frequency and has 2 x nocturia.  No nocturia. Musculoskeletal:  No neck pain.  She reports lower back pain and has L4L5 DJD Integumentary: No rash, pruritus, skin lesions Neurological: as above Psychiatric: No depression at this time.  No anxiety Endocrine: Her weight is stable.   She has had hot flashes  recently Hematologic/Lymphatic:  No anemia, purpura, petechiae. Allergic/Immunologic: No itchy/runny eyes, nasal congestion, recent allergic reactions, rashes  ALLERGIES: No Known Allergies  HOME MEDICATIONS: Outpatient Medications Prior to Visit  Medication Sig Dispense Refill  . aspirin 81 MG tablet Take 81 mg by mouth daily.    . clonazePAM (KLONOPIN) 0.5 MG tablet Take 1 tablet (0.5 mg total) by mouth daily as needed for anxiety. 30 tablet 2  . DULoxetine (CYMBALTA) 60 MG capsule Take 1 capsule (60 mg total) by mouth  daily. 30 capsule 2  . ezetimibe (ZETIA) 10 MG tablet Take 1 tablet (10 mg total) by mouth daily. 30 tablet 5  . folic acid-pyridoxine-cyancobalamin (FOLTX) 2.5-25-2 MG TABS tablet Take 1 tablet by mouth daily. 90 each 3  . gabapentin (NEURONTIN) 600 MG tablet Take 1 tablet (600 mg total) by mouth 3 (three) times daily. 270 tablet 3  . ibuprofen (ADVIL,MOTRIN) 200 MG tablet Take 800 mg by mouth every 6 (six) hours as needed for headache or moderate pain.    . isosorbide mononitrate (IMDUR) 30 MG 24 hr tablet Take 0.5 tablets (15 mg total) by mouth daily. 15 tablet 3  . metaxalone (SKELAXIN) 800 MG tablet Take 800 mg by mouth 2 (two) times daily as needed for muscle spasms.     . mometasone (NASONEX) 50 MCG/ACT nasal spray USE 2 SPRAYS NASALLY DAILY 51 g 1  . Multiple Vitamin (MULTIVITAMIN WITH MINERALS) TABS tablet Take 1 tablet by mouth daily.    Marland Kitchen oxybutynin (DITROPAN-XL) 10 MG 24 hr tablet Take 1 tablet (10 mg total) by mouth at bedtime. 90 tablet 3  . pantoprazole (PROTONIX) 40 MG tablet TAKE 1 TABLET BY MOUTH  EVERY MORNING 90 tablet 0  . ranitidine (ZANTAC) 150 MG tablet Take 1 tablet (150 mg total) by mouth at bedtime. 90 tablet 0  . rosuvastatin (CRESTOR) 40 MG tablet Take 1 tablet (40 mg total) by mouth daily. 90 tablet 3  . triamterene-hydrochlorothiazide (MAXZIDE-25) 37.5-25 MG tablet Take 1 tablet by mouth daily. 90 tablet 1  . Armodafinil 250 MG tablet Take 1  tablet (250 mg total) by mouth daily. 30 tablet 5  . traMADol (ULTRAM) 50 MG tablet 1-2 pills as needed up to 4/day 90 tablet 3  . acyclovir (ZOVIRAX) 400 MG tablet Take 1 tablet (400 mg total) by mouth 3 (three) times daily. 21 tablet 1  . fluconazole (DIFLUCAN) 150 MG tablet One tablet once daily , as needed, for vaginal itch associated with prolonged antibiotic use 3 tablet 0  . levofloxacin (LEVAQUIN) 500 MG tablet Take 1 tablet (500 mg total) by mouth daily. 14 tablet 0  . metoprolol tartrate (LOPRESSOR) 25 MG tablet Take 0.5 tablets (12.5 mg total) by mouth 2 (two) times daily. 90 tablet 3   Facility-Administered Medications Prior to Visit  Medication Dose Route Frequency Provider Last Rate Last Dose  . 0.9 %  sodium chloride infusion  500 mL Intravenous Continuous Pyrtle, Lajuan Lines, MD      . 0.9 %  sodium chloride infusion  500 mL Intravenous Continuous Pyrtle, Lajuan Lines, MD      . gadopentetate dimeglumine (MAGNEVIST) injection 20 mL  20 mL Intravenous Once PRN Alahia Whicker, Nanine Means, MD        PAST MEDICAL HISTORY: Past Medical History:  Diagnosis Date  . Allergy   . Back pain   . Depression   . GERD (gastroesophageal reflux disease)   . Hepatic steatosis   . Hiatal hernia   . Hyperlipidemia   . Hypertension   . Internal hemorrhoids   . MS (multiple sclerosis) ([place])    2007  . Multiple sclerosis ([place])   . Schatzki's ring   . Sleep apnea    wears CPAP    PAST SURGICAL HISTORY: Past Surgical History:  Procedure Laterality Date  . ABDOMINAL HYSTERECTOMY  2005   fibroids  . CARDIAC CATHETERIZATION N/A 05/04/2016   Procedure: Left Heart Cath and Coronary Angiography;  Surgeon: Jettie Booze, MD;  Location: [facility];  Service: Cardiovascular;  Laterality: N/A;  . ESOPHAGOGASTRODUODENOSCOPY N/A 09/18/2013   Procedure: ESOPHAGOGASTRODUODENOSCOPY (EGD);  Surgeon: Jerene Bears, MD;  Location: [facility];  Service: Gastroenterology;  Laterality: N/A;  . UPPER  GASTROINTESTINAL ENDOSCOPY    . WISDOM TOOTH EXTRACTION      FAMILY HISTORY: Family History  Problem Relation Age of Onset  . Hypertension Mother   . Hyperlipidemia Mother   . Depression Mother   . Hypertension Father   . Colon cancer Neg Hx   . Esophageal cancer Neg Hx   . Rectal cancer Neg Hx   . Stomach cancer Neg Hx     SOCIAL HISTORY:  Social History   Socioeconomic History  . Marital status: Married    Spouse name: Not on file  . Number of children: Not on file  . Years of education: Not on file  . Highest education level: Not on file  Social Needs  . Financial resource strain: Not on file  . Food insecurity - worry: Not on file  . Food insecurity - inability: Not on file  . Transportation needs - medical: Not on file  . Transportation needs - non-medical: Not on file  Occupational History  . Not on file  Tobacco Use  . Smoking status: Never Smoker  . Smokeless tobacco: Never Used  Substance and Sexual Activity  . Alcohol use: No  . Drug use: No  . Sexual activity: Yes    Birth control/protection: Surgical  Other Topics Concern  . Not on file  Social History Narrative  . Not on file     PHYSICAL EXAM  Vitals:   05/19/17 1556  BP: 132/69  Pulse: 85  Resp: 18  Weight: 183 lb 8 oz (83.2 kg)  Height: 5\' 3"  (1.6 m)    Body mass index is 32.51 kg/m.   General: The patient is well-developed and well-nourished and in no acute distress.  Affect is normal.   Musculoskeletal:   She has mild tenderness in the lower lumbar spine there is no tenderness in the muscles of the upper back or chest.   Neurologic Exam  Mental status: The patient is alert and oriented x 3 at the time of the examination. The patient has apparent normal recent and remote memory, with an apparently normal attention span and concentration ability.   Speech is normal.  Cranial nerves: Extraocular movements are full. Facial strength and sensation is normal. Palatal elevation tongue  protrusion midline.  Trapezius and sternocleidomastoid strength is normal. No dysarthria is noted.     Motor:  Muscle bulk is normal. Tone was normal in the arms but slightly increased in the legs, a little more on the right. Strength is  5 / 5 in all 4 extremities.   Sensory: Sensory testing is intact to touch in all 4 extremities.  Coordination: Cerebellar testing reveals good finger-nose-finger   Gait and station: The station is stable.. Gait was normal. Tandem walk was mildly wide.   Borderline Romberg.    Reflexes: Deep tendon reflexes are brisk in the legs withpout spread and normal in the arms bilaterally.     DIAGNOSTIC DATA (LABS, IMAGING, TESTING) - I reviewed patient records, labs, notes, testing and imaging myself where available.  Lab Results  Component Value Date   WBC 10.6 05/10/2017   HGB 13.0 05/10/2017   HCT 37.8 05/10/2017   MCV 77.6 (L) 05/10/2017   PLT 403 (H) 05/10/2017      Component Value Date/Time  NA 140 05/10/2017 0907   K 3.8 05/10/2017 0907   CL 102 05/10/2017 0907   CO2 31 05/10/2017 0907   GLUCOSE 105 (H) 05/10/2017 0907   BUN 18 05/10/2017 0907   CREATININE 0.86 05/10/2017 0907   CALCIUM 10.1 05/10/2017 0907   PROT 7.5 05/10/2017 0907   ALBUMIN 4.2 06/30/2016 1611   AST 20 05/10/2017 0907   ALT 16 05/10/2017 0907   ALKPHOS 86 06/30/2016 1611   BILITOT 0.4 05/10/2017 0907   GFRNONAA 74 05/10/2017 0907   GFRAA 86 05/10/2017 0907   Lab Results  Component Value Date   CHOL 138 05/10/2017   HDL 47 (L) 05/10/2017   LDLCALC 142 (H) 06/30/2016   TRIG 123 05/10/2017   CHOLHDL 2.9 05/10/2017   Lab Results  Component Value Date   HGBA1C 5.5 05/10/2017   No results found for: VITAMINB12 Lab Results  Component Value Date   TSH 2.36 05/10/2017       ASSESSMENT AND PLAN  White matter abnormality on MRI of brain  Obstructive sleep apnea  Dysesthesia - Plan: NCV with EMG(electromyography)  Carpal tunnel syndrome, bilateral -  Plan: NCV with EMG(electromyography)   1.   We discussed her MRI showing multiple nonspecific foci. There was no change over a two-year period of time as findings were nonspecific and stable, MS is not very likely. She will remain off of a disease modifying therapy. 2.    Use CPAP nightly and try to get at least 6 hours of sleep. She will continue Nuvigil. 3.    Medrol Dosepak for possible carpal tunnel syndrome. We will also check nerve conduction and EMG studies to determine if the carpal tunnel is bad enough to refer her for surgery. 4.    Wear wrist splints when in bed.  5.   Return in 12 months or sooner if there are new or worsening neurologic symptoms.   Tekoa Amon A. Felecia Shelling, MD, PhD 2/62/0355, 9:74 PM Certified in Neurology, Clinical Neurophysiology, Sleep Medicine, Pain Medicine and Neuroimaging  [facility] [address] [place], [place] [address] [phone]

## 2017-05-20 DIAGNOSIS — M545 Low back pain: Secondary | ICD-10-CM | POA: Diagnosis not present

## 2017-05-20 DIAGNOSIS — S233XXA Sprain of ligaments of thoracic spine, initial encounter: Secondary | ICD-10-CM | POA: Diagnosis not present

## 2017-05-20 DIAGNOSIS — M47816 Spondylosis without myelopathy or radiculopathy, lumbar region: Secondary | ICD-10-CM | POA: Diagnosis not present

## 2017-05-25 DIAGNOSIS — M47816 Spondylosis without myelopathy or radiculopathy, lumbar region: Secondary | ICD-10-CM | POA: Diagnosis not present

## 2017-05-25 DIAGNOSIS — M545 Low back pain: Secondary | ICD-10-CM | POA: Diagnosis not present

## 2017-05-25 DIAGNOSIS — S233XXA Sprain of ligaments of thoracic spine, initial encounter: Secondary | ICD-10-CM | POA: Diagnosis not present

## 2017-05-26 ENCOUNTER — Ambulatory Visit: Payer: BLUE CROSS/BLUE SHIELD | Admitting: Family Medicine

## 2017-05-26 ENCOUNTER — Encounter: Payer: Self-pay | Admitting: Family Medicine

## 2017-05-26 ENCOUNTER — Other Ambulatory Visit: Payer: Self-pay

## 2017-05-26 ENCOUNTER — Encounter (HOSPITAL_COMMUNITY): Payer: Self-pay | Admitting: Psychiatry

## 2017-05-26 ENCOUNTER — Ambulatory Visit (HOSPITAL_COMMUNITY): Payer: BLUE CROSS/BLUE SHIELD | Admitting: Psychiatry

## 2017-05-26 VITALS — BP 138/62 | HR 72 | Temp 98.4°F | Resp 16 | Ht 63.0 in | Wt 184.2 lb

## 2017-05-26 DIAGNOSIS — E785 Hyperlipidemia, unspecified: Secondary | ICD-10-CM | POA: Diagnosis not present

## 2017-05-26 DIAGNOSIS — F331 Major depressive disorder, recurrent, moderate: Secondary | ICD-10-CM | POA: Diagnosis not present

## 2017-05-26 DIAGNOSIS — G35 Multiple sclerosis: Secondary | ICD-10-CM | POA: Diagnosis not present

## 2017-05-26 DIAGNOSIS — E6609 Other obesity due to excess calories: Secondary | ICD-10-CM

## 2017-05-26 DIAGNOSIS — N39498 Other specified urinary incontinence: Secondary | ICD-10-CM

## 2017-05-26 DIAGNOSIS — I10 Essential (primary) hypertension: Secondary | ICD-10-CM | POA: Diagnosis not present

## 2017-05-26 DIAGNOSIS — F418 Other specified anxiety disorders: Secondary | ICD-10-CM

## 2017-05-26 NOTE — Progress Notes (Signed)
                   THERAPIST PROGRESS NOTE      Session Time:    Wednesday 05/26/2017 9:12 AM - 10:10 AM                 Participation Level: Active  Behavioral Response: CasualAlert/ improved mood, less anxious                 Type of Therapy: Individual Therapy        Treatment Goals:  1. Learn and implement coping and behavioral strategies to overcome depression.     2. Identify and replace thoughts and beliefs that support depression.         Treatment Goals addressed: 1,2  Interventions: Supportive,  ACT  Summary: Pamela Walters is a 57 y.o. female who is referred for services by PCP Dr. Moshe Cipro due to patient suffering symptoms of depression. Patient has been seen in the office interminttently for medication management and outpatient psychotherapy since 2016. She recently resumed services and was last seen 4 weeks ago. She reports increased depressed mood, poor motivation, anxiety, decreased interest in activities, diminished pleasure in activities, lack of appetite, excessive worry, and fatigue since last session. Patient also reports increased worry about having another panic attack as she had one about 2 months ago. She expresses frustration with self as she was not able to enjoy the holidays as she has in the past. 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 She also reports she has not been practicing relaxation techniques consistently.      Patient last was seen 2 weeks ago. She reports continuing to feel better since last session. She has improved self-care regarding eating and sleeping patterns. Patient reports eating more healthy and cooking some meals at home. She also cites incident at work where she set boundaries in order to have time to rest/sleep at home. She reports improved appetite and increased interest as well as pleasure in activities. She recently enjoyed a weekend trip with her son and his family. She also enjoyed going out to  dinner with her husband and granddaughter.  She reports continued medication compliance and says she thinks Cymbalta as prescribed by psychiatrist Dr. Harrington Challenger is really working. She continues to practice relaxation techniques daily. She reports decreased intensity of her fear but still is fearful of having a panic attack . She hasn't had any since last session. Patient is thankful she feels less depressed and is experiencing increased energy and motivation but also is fearful of having a relapse.  Therapist Response:   Reviewed symptoms, praised and reinforced patient's improved medication compliance and consistent practice of relaxation techniques,  praised and reinforced patient's increased involvement in activity, discussed effects on mood and behavior, reviewed psychoeducation regarding panic attacks including identifying the panic cycle and ways to manage, provided patient with handouts on panic cycle to review, used defusion technique to help patient cope with thoughts about having another panic attack., assigned patient to practice relaxation techniques daily,    Plan: Return again in 2 weeks.   Diagnosis: Axis I: Major Depressive Disorder, Recurrent, Moderate    Axis II: No diagnosis    Alin Hutchins, LCSW 05/26/2017

## 2017-05-26 NOTE — Patient Instructions (Addendum)
F/u in 5 months, call if you need me before [place] lipid, cmp and EGFR  Congrats on great labs  It is important that you exercise regularly at least 30 minutes 5 times a week. If you develop chest pain, have severe difficulty breathing, or feel very tired, stop exercising immediately and seek medical attention    Please reschedule your mammogram  Thanks for choosing [facility], we consider it a privelige to serve you.

## 2017-05-26 NOTE — Progress Notes (Signed)
lipi

## 2017-05-28 DIAGNOSIS — M545 Low back pain: Secondary | ICD-10-CM | POA: Diagnosis not present

## 2017-05-28 DIAGNOSIS — M47816 Spondylosis without myelopathy or radiculopathy, lumbar region: Secondary | ICD-10-CM | POA: Diagnosis not present

## 2017-05-28 DIAGNOSIS — S233XXA Sprain of ligaments of thoracic spine, initial encounter: Secondary | ICD-10-CM | POA: Diagnosis not present

## 2017-05-30 ENCOUNTER — Encounter: Payer: Self-pay | Admitting: Family Medicine

## 2017-05-30 NOTE — Assessment & Plan Note (Signed)
Unchanged. Patient re-educated about  the importance of commitment to a  minimum of 150 minutes of exercise per week.  The importance of healthy food choices with portion control discussed. Encouraged to start a food diary, count calories and to consider  joining a support group. Sample diet sheets offered. Goals set by the patient for the next several months.   Weight /BMI 05/26/2017 05/19/2017 02/02/2017  WEIGHT 184 lb 4 oz 183 lb 8 oz 183 lb 0.6 oz  HEIGHT 5\' 3"  5\' 3"  5\' 3"   BMI 32.64 kg/m2 32.51 kg/m2 32.42 kg/m2  Some encounter information is confidential and restricted. Go to Review Flowsheets activity to see all data.

## 2017-05-30 NOTE — Progress Notes (Signed)
Pamela Walters     MRN: [id number]      DOB: [birth year]   HPI Pamela Walters is here for follow up and re-evaluation of chronic medical conditions, medication management and review of any available recent lab and radiology data.  Preventive health is updated, specifically  Cancer screening and Immunization.   Questions or concerns regarding consultations or procedures which the PT has had in the interim are  addressed. The PT denies any adverse reactions to current medications since the last visit.  Had a low between Thanksgiving and Christmas but sought the mental health support needed to overcome this ROS Denies recent fever or chills. Denies sinus pressure, nasal congestion, ear pain or sore throat. Denies chest congestion, productive cough or wheezing. Denies chest pains, palpitations and leg swelling Denies abdominal pain, nausea, vomiting,diarrhea or constipation.   Denies dysuria, frequency, hesitancy or incontinence. Denies joint pain, swelling and limitation in mobility. Denies headaches, seizures, numbness, or tingling. Denies uncontrolled depression, anxiety or insomnia. Denies skin break down or rash.   PE  BP 138/62 (BP Location: Left Arm, Patient Position: Sitting, Cuff Size: Normal)   Pulse 72   Temp 98.4 F (36.9 C) (Temporal)   Resp 16   Ht 5\' 3"  (1.6 m)   Wt 184 lb 4 oz (83.6 kg)   SpO2 98%   BMI 32.64 kg/m   Patient alert and oriented and in no cardiopulmonary distress.  HEENT: No facial asymmetry, EOMI,   oropharynx pink and moist.  Neck supple no JVD, no mass.  Chest: Clear to auscultation bilaterally.  CVS: S1, S2 no murmurs, no S3.Regular rate.  ABD: Soft non tender.   Ext: No edema  MS: Adequate ROM spine, shoulders, hips and knees.  Skin: Intact, no ulcerations or rash noted.  Psych: Good eye contact, normal affect. Memory intact not anxious or depressed appearing.  CNS: CN 2-12 intact, power,  normal throughout.no focal deficits  noted.   Assessment & Plan  Essential hypertension Controlled, no change in medication DASH diet and commitment to daily physical activity for a minimum of 30 minutes discussed and encouraged, as a part of hypertension management. The importance of attaining a healthy weight is also discussed.  BP/Weight 05/26/2017 05/19/2017 02/02/2017 10/22/2016 10/05/2016 3/50/0938 05/05/2991  Systolic BP 716 967 893 810 175 102 585  Diastolic BP 62 69 82 70 86 70 78  Wt. (Lbs) 184.25 183.5 183.04 189 185.13 186.5 192  BMI 32.64 32.51 32.42 33.48 33.63 33.89 34.01  Some encounter information is confidential and restricted. Go to Review Flowsheets activity to see all data.       Hyperlipidemia LDL goal <100 Improved, no change in medication, needs to  Commit to more regular exercise Hyperlipidemia:Low fat diet discussed and encouraged.   Lipid Panel  Lab Results  Component Value Date   CHOL 138 05/10/2017   HDL 47 (L) 05/10/2017   LDLCALC 142 (H) 06/30/2016   TRIG 123 05/10/2017   CHOLHDL 2.9 05/10/2017       Urinary incontinence Controlled, no change in medication   Obesity Unchanged. Patient re-educated about  the importance of commitment to a  minimum of 150 minutes of exercise per week.  The importance of healthy food choices with portion control discussed. Encouraged to start a food diary, count calories and to consider  joining a support group. Sample diet sheets offered. Goals set by the patient for the next several months.   Weight /BMI 05/26/2017 05/19/2017 02/02/2017  WEIGHT 184 lb  4 oz 183 lb 8 oz 183 lb 0.6 oz  HEIGHT 5\' 3"  5\' 3"  5\' 3"   BMI 32.64 kg/m2 32.51 kg/m2 32.42 kg/m2  Some encounter information is confidential and restricted. Go to Review Flowsheets activity to see all data.      Multiple sclerosis ([place]) Stable and managed by neurology  Depression with anxiety Improved andd followed closely by psychiatry

## 2017-05-30 NOTE — Assessment & Plan Note (Signed)
Stable and managed by neurology

## 2017-05-30 NOTE — Assessment & Plan Note (Signed)
Controlled, no change in medication  

## 2017-05-30 NOTE — Assessment & Plan Note (Signed)
Improved, no change in medication, needs to  Commit to more regular exercise Hyperlipidemia:Low fat diet discussed and encouraged.   Lipid Panel  Lab Results  Component Value Date   CHOL 138 05/10/2017   HDL 47 (L) 05/10/2017   LDLCALC 142 (H) 06/30/2016   TRIG 123 05/10/2017   CHOLHDL 2.9 05/10/2017

## 2017-05-30 NOTE — Assessment & Plan Note (Signed)
Controlled, no change in medication DASH diet and commitment to daily physical activity for a minimum of 30 minutes discussed and encouraged, as a part of hypertension management. The importance of attaining a healthy weight is also discussed.  BP/Weight 05/26/2017 05/19/2017 02/02/2017 10/22/2016 10/05/2016 09/28/7996 10/26/1585  Systolic BP 276 184 859 276 394 320 037  Diastolic BP 62 69 82 70 86 70 78  Wt. (Lbs) 184.25 183.5 183.04 189 185.13 186.5 192  BMI 32.64 32.51 32.42 33.48 33.63 33.89 34.01  Some encounter information is confidential and restricted. Go to Review Flowsheets activity to see all data.

## 2017-05-30 NOTE — Assessment & Plan Note (Signed)
Improved andd followed closely by psychiatry

## 2017-06-08 ENCOUNTER — Encounter: Payer: BLUE CROSS/BLUE SHIELD | Admitting: Neurology

## 2017-06-09 ENCOUNTER — Ambulatory Visit (HOSPITAL_COMMUNITY): Payer: Self-pay | Admitting: Psychiatry

## 2017-06-18 DIAGNOSIS — M545 Low back pain: Secondary | ICD-10-CM | POA: Diagnosis not present

## 2017-06-18 DIAGNOSIS — S233XXA Sprain of ligaments of thoracic spine, initial encounter: Secondary | ICD-10-CM | POA: Diagnosis not present

## 2017-06-18 DIAGNOSIS — M47816 Spondylosis without myelopathy or radiculopathy, lumbar region: Secondary | ICD-10-CM | POA: Diagnosis not present

## 2017-06-22 DIAGNOSIS — S233XXA Sprain of ligaments of thoracic spine, initial encounter: Secondary | ICD-10-CM | POA: Diagnosis not present

## 2017-06-22 DIAGNOSIS — M545 Low back pain: Secondary | ICD-10-CM | POA: Diagnosis not present

## 2017-06-22 DIAGNOSIS — M47816 Spondylosis without myelopathy or radiculopathy, lumbar region: Secondary | ICD-10-CM | POA: Diagnosis not present

## 2017-06-23 ENCOUNTER — Encounter (HOSPITAL_COMMUNITY): Payer: Self-pay | Admitting: Psychiatry

## 2017-06-23 ENCOUNTER — Ambulatory Visit (HOSPITAL_COMMUNITY): Payer: BLUE CROSS/BLUE SHIELD | Admitting: Psychiatry

## 2017-06-23 DIAGNOSIS — F331 Major depressive disorder, recurrent, moderate: Secondary | ICD-10-CM | POA: Diagnosis not present

## 2017-06-23 NOTE — Progress Notes (Signed)
                   THERAPIST PROGRESS NOTE      Session Time:    Wednesday 06/23/2017 9:10 AM -  9:59 AM                   Participation Level: Active  Behavioral Response: CasualAlert/ improved mood, less anxious                 Type of Therapy: Individual Therapy        Treatment Goals:  1. Learn and implement coping and behavioral strategies to overcome depression.     2. Identify and replace thoughts and beliefs that support depression.         Treatment Goals addressed: 1,2  Interventions: Supportive,  ACT  Summary: Pamela Walters is a 57 y.o. female who is referred for services by PCP Dr. Moshe Cipro due to patient suffering symptoms of depression. Patient has been seen in the office interminttently for medication management and outpatient psychotherapy since 2016. She recently resumed services and was last seen 4 weeks ago. She reports increased depressed mood, poor motivation, anxiety, decreased interest in activities, diminished pleasure in activities, lack of appetite, excessive worry, and fatigue since last session. Patient also reports increased worry about having another panic attack as she had one about 2 months ago. She expresses frustration with self as she was not able to enjoy the holidays as she has in the past. 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 She also reports she has not   been practicing relaxation techniques consistently.      Patient last was seen 4 weeks ago. She reports continuing to feel better since last session. She has continued to  improvedself-care regarding eating patterns and  reports eating more healthy and cooking some meals at home. She reports  increased interest as well as pleasure in activities. She continues to practice relaxation techniques (deep breathing and progressive muscle relaxation) daily. She also reports scheduling time for self-nurture. She reports decreased worry about having a panic attack and  has been using defusion technique to cope with with thoughts about having another panic attack. She denies having any panic attacks since last session. She reports increased sleep difficulty as she has been having bad dreams. She reports sometimes being fearful of going to sleep. She suspects bad dreams may be  related to medication or possibility of no llonger being able to manage the erratic hours of shift work.   Therapist Response:   Reviewed symptoms, praised and reinforced patient's  consistent practice of relaxation techniques,  praised and reinforced patient's improved self care and self nurture, praised patient's use of diffusion technique and discussed results, assisted patient identify ways to use diffusion technique to manage ruminating thoughts about having a bad dream, practiced guided imagery as a relaxation technique to use prior to bedtime  Plan: Return again in 2 weeks.   Diagnosis: Axis I: Major Depressive Disorder, Recurrent, Moderate    Axis II: No diagnosis    [name], LCSW 06/23/2017

## 2017-06-24 ENCOUNTER — Encounter: Payer: Self-pay | Admitting: Neurology

## 2017-06-29 ENCOUNTER — Encounter: Payer: BLUE CROSS/BLUE SHIELD | Admitting: Neurology

## 2017-06-29 DIAGNOSIS — M545 Low back pain: Secondary | ICD-10-CM | POA: Diagnosis not present

## 2017-06-29 DIAGNOSIS — M47816 Spondylosis without myelopathy or radiculopathy, lumbar region: Secondary | ICD-10-CM | POA: Diagnosis not present

## 2017-06-29 DIAGNOSIS — S233XXA Sprain of ligaments of thoracic spine, initial encounter: Secondary | ICD-10-CM | POA: Diagnosis not present

## 2017-07-07 ENCOUNTER — Ambulatory Visit (HOSPITAL_COMMUNITY): Payer: BLUE CROSS/BLUE SHIELD | Admitting: Psychiatry

## 2017-07-07 ENCOUNTER — Encounter (HOSPITAL_COMMUNITY): Payer: Self-pay | Admitting: Psychiatry

## 2017-07-07 DIAGNOSIS — F331 Major depressive disorder, recurrent, moderate: Secondary | ICD-10-CM | POA: Diagnosis not present

## 2017-07-07 NOTE — Progress Notes (Signed)
                   THERAPIST PROGRESS NOTE          Session Time:    Wednesday 07/07/2017 9:15 AM -  10:10 AM                    Participation Level: Active  Behavioral Response: Casual/ drowsy/ lethargic, increased anxiety, depressed                 Type of Therapy: Individual Therapy        Treatment Goals:  1. Learn and implement coping and behavioral strategies to overcome depression.     2. Identify and replace thoughts and beliefs that support depression.         Treatment Goals addressed: 1,2  Interventions: Supportive,  ACT  Summary: Pamela Walters is a 57 y.o. female who is referred for services by PCP Dr. Moshe Cipro due to patient suffering symptoms of depression. Patient has been seen in the office interminttently for medication management and outpatient psychotherapy since 2016. She recently resumed services and was last seen 4 weeks ago. She reports increased depressed mood, poor motivation, anxiety, decreased interest in activities, diminished pleasure in activities, lack of appetite, excessive worry, and fatigue since last session. Patient also reports increased worry about having another panic attack as she had one about 2 months ago. She expresses frustration with self as she was not able to enjoy the holidays as she has in the past. 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 She also reports she has not   been practicing relaxation techniques consistently.      Patient reports increased stress, sleep difficulty, fatigue, and worry since last session. She hasn't had any panic attacks but is experiencing continued sleep difficulty due to having bad dreams. Patient reports fearing to go to sleep due to the dreams. She has difficulty going to sleep and reports waking up several times during her sleep cycle. She also has ruminating thoughts about the dreams during the course of her day. She is experiencing poor concentration and says other  people have noticed. She has been practicing relaxation techniques and imagery but says have not been helpful in reducing bad dreams. She is scheduled to see psychiatrist Dr. Harrington Challenger tomorrow and will discuss. She expresses frustration fatigue is preventing her from participating in social events with her family.  Therapist Response:   Reviewed symptoms, administered PHQ-9, praised and reinforced patient's  consistent practice of relaxation techniques,  reviewed concept of observing mind and thinking mind, assisted patient identify ways to use cognitive defusion technique to manage ruminating thoughts about having a bad dreams,   Plan: Return again in 2 weeks.   Diagnosis: Axis I: Major Depressive Disorder, Recurrent, Moderate    Axis II: No diagnosis    Monte Zinni, LCSW 07/07/2017

## 2017-07-08 ENCOUNTER — Ambulatory Visit (INDEPENDENT_AMBULATORY_CARE_PROVIDER_SITE_OTHER): Payer: BLUE CROSS/BLUE SHIELD | Admitting: Psychiatry

## 2017-07-08 ENCOUNTER — Encounter (HOSPITAL_COMMUNITY): Payer: Self-pay | Admitting: Psychiatry

## 2017-07-08 VITALS — BP 150/82 | HR 72 | Ht 63.0 in | Wt 181.0 lb

## 2017-07-08 DIAGNOSIS — F331 Major depressive disorder, recurrent, moderate: Secondary | ICD-10-CM | POA: Diagnosis not present

## 2017-07-08 DIAGNOSIS — F419 Anxiety disorder, unspecified: Secondary | ICD-10-CM

## 2017-07-08 DIAGNOSIS — Z818 Family history of other mental and behavioral disorders: Secondary | ICD-10-CM | POA: Diagnosis not present

## 2017-07-08 MED ORDER — DULOXETINE HCL 60 MG PO CPEP: 60.0000 mg | ORAL_CAPSULE | Freq: Every day | ORAL | 2 refills | Status: DC

## 2017-07-08 NOTE — Progress Notes (Signed)
[facility] MD/PA/NP OP Progress Note  07/08/2017 9:00 AM Pamela Walters  MRN:  [id number]  Chief Complaint:  Chief Complaint    Depression; Anxiety; Follow-up     HPI: This patient is a 57 year old married white female who lives with her husband in [place].  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  The patient returns for follow-up of treatment for depression and anxiety.  She was last seen in August and was switched from Prozac to Cymbalta.  She did not feel like the Prozac was helping that much anymore and she was having tonic pain.  She states she had been taking the Cymbalta consistently because of her work schedule she kept forgetting.  However back in November she was out with her family and had a severe panic attack and had to go to the hospital emergency room for workup.  Nothing really showed up but this scared her.  For the last couple of weeks she has been taking the Cymbalta every day and she is starting to feel better.  She still has periods of feeling shaky and jittery even in her own home.  She is generally sleeping well.  Her neurologist is still following her MS and she is going to have repeat brain scan in the next month.  She occasionally has symptoms like numbness in her hands and feet and gets a bit off balance when she is tired.  She is still able to work  The patient returns after 2 months.  She states that overall she feels better on Cymbalta in terms of less depression.  However she states that her sleep is very interrupted and she intense dreams.  She wakes up every hour or so and goes back to the intense dreaming.  She feels like she is not rested through the day and her family has noticed that she is more tired.  Her neurologist prescribed Nuvigil but she only takes it on days that she works.  She is working the night shift which is also an issue because she has to try to sleep during the daytime hours.  She usually takes the Cymbalta when she gets home  from work.  I suggested that she take the Cymbalta prior to going to work in other words what ever her morning seems to be that day.  I also suggested she take the Nuvigil every day and not just on days that she works.  We will try this for 4 weeks and see if it helps the intense dreams and fatigue if not we will have to make some medication adjustments.  She has had a sleep study and does wear CPAP at night Visit Diagnosis:    ICD-10-CM   1. Major depressive disorder, recurrent episode, moderate (HCC) F33.1     Past Psychiatric History: none  Past Medical History:  Past Medical History:  Diagnosis Date  . Allergy   . Back pain   . Depression   . GERD (gastroesophageal reflux disease)   . Hepatic steatosis   . Hiatal hernia   . Hyperlipidemia   . Hypertension   . Internal hemorrhoids   . MS (multiple sclerosis) ([place])    2007  . Multiple sclerosis ([place])   . Schatzki's ring   . Sleep apnea    wears CPAP    Past Surgical History:  Procedure Laterality Date  . ABDOMINAL HYSTERECTOMY  2005   fibroids  . CARDIAC CATHETERIZATION N/A 05/04/2016   Procedure: Left Heart  Cath and Coronary Angiography;  Surgeon: Jettie Booze, MD;  Location: [facility];  Service: Cardiovascular;  Laterality: N/A;  . ESOPHAGOGASTRODUODENOSCOPY N/A 09/18/2013   Procedure: ESOPHAGOGASTRODUODENOSCOPY (EGD);  Surgeon: Jerene Bears, MD;  Location: [facility];  Service: Gastroenterology;  Laterality: N/A;  . UPPER GASTROINTESTINAL ENDOSCOPY    . WISDOM TOOTH EXTRACTION      Family Psychiatric History: See below  Family History:  Family History  Problem Relation Age of Onset  . Hypertension Mother   . Hyperlipidemia Mother   . Depression Mother   . Hypertension Father   . Colon cancer Neg Hx   . Esophageal cancer Neg Hx   . Rectal cancer Neg Hx   . Stomach cancer Neg Hx     Social History:  Social History   Socioeconomic History  . Marital status: Married    Spouse name: None  .  Number of children: None  . Years of education: None  . Highest education level: None  Social Needs  . Financial resource strain: None  . Food insecurity - worry: None  . Food insecurity - inability: None  . Transportation needs - medical: None  . Transportation needs - non-medical: None  Occupational History  . None  Tobacco Use  . Smoking status: Never Smoker  . Smokeless tobacco: Never Used  Substance and Sexual Activity  . Alcohol use: No  . Drug use: No  . Sexual activity: Yes    Birth control/protection: Surgical  Other Topics Concern  . None  Social History Narrative  . None    Allergies: No Known Allergies  Metabolic Disorder Labs: Lab Results  Component Value Date   HGBA1C 5.5 05/10/2017   MPG 111 05/10/2017   MPG 108 02/20/2016   No results found for: PROLACTIN Lab Results  Component Value Date   CHOL 138 05/10/2017   TRIG 123 05/10/2017   HDL 47 (L) 05/10/2017   CHOLHDL 2.9 05/10/2017   VLDL 34 (H) 06/30/2016   LDLCALC 71 05/10/2017   LDLCALC 194 (H) 01/29/2017   Lab Results  Component Value Date   TSH 2.36 05/10/2017   TSH 4.02 02/20/2016    Therapeutic Level Labs: No results found for: LITHIUM No results found for: VALPROATE No components found for:  CBMZ  Current Medications: Current Outpatient Medications  Medication Sig Dispense Refill  . acyclovir (ZOVIRAX) 400 MG tablet Take 1 tablet (400 mg total) by mouth 3 (three) times daily. 21 tablet 1  . Armodafinil 250 MG tablet Take 1 tablet (250 mg total) by mouth daily. 30 tablet 5  . aspirin 81 MG tablet Take 81 mg by mouth daily.    . clonazePAM (KLONOPIN) 0.5 MG tablet Take 1 tablet (0.5 mg total) by mouth daily as needed for anxiety. 30 tablet 2  . DULoxetine (CYMBALTA) 60 MG capsule Take 1 capsule (60 mg total) by mouth daily. 30 capsule 2  . ezetimibe (ZETIA) 10 MG tablet Take 1 tablet (10 mg total) by mouth daily. 30 tablet 5  . folic acid-pyridoxine-cyancobalamin (FOLTX) 2.5-25-2 MG  TABS tablet Take 1 tablet by mouth daily. 90 each 3  . gabapentin (NEURONTIN) 600 MG tablet Take 1 tablet (600 mg total) by mouth 3 (three) times daily. 270 tablet 3  . isosorbide mononitrate (IMDUR) 30 MG 24 hr tablet Take 0.5 tablets (15 mg total) by mouth daily. 15 tablet 3  . metaxalone (SKELAXIN) 800 MG tablet Take 800 mg by mouth 2 (two) times daily as needed  for muscle spasms.     . mometasone (NASONEX) 50 MCG/ACT nasal spray USE 2 SPRAYS NASALLY DAILY 51 g 1  . Multiple Vitamin (MULTIVITAMIN WITH MINERALS) TABS tablet Take 1 tablet by mouth daily.    Marland Kitchen oxybutynin (DITROPAN-XL) 10 MG 24 hr tablet Take 1 tablet (10 mg total) by mouth at bedtime. 90 tablet 3  . pantoprazole (PROTONIX) 40 MG tablet TAKE 1 TABLET BY MOUTH  EVERY MORNING 90 tablet 0  . ranitidine (ZANTAC) 150 MG tablet Take 1 tablet (150 mg total) by mouth at bedtime. 90 tablet 0  . rosuvastatin (CRESTOR) 40 MG tablet Take 1 tablet (40 mg total) by mouth daily. 90 tablet 3  . traMADol (ULTRAM) 50 MG tablet 1-2 pills as needed up to 4/day 90 tablet 3  . triamterene-hydrochlorothiazide (MAXZIDE-25) 37.5-25 MG tablet Take 1 tablet by mouth daily. 90 tablet 1  . metoprolol tartrate (LOPRESSOR) 25 MG tablet Take 0.5 tablets (12.5 mg total) by mouth 2 (two) times daily. 90 tablet 3   Current Facility-Administered Medications  Medication Dose Route Frequency Provider Last Rate Last Dose  . 0.9 %  sodium chloride infusion  500 mL Intravenous Continuous Pyrtle, Lajuan Lines, MD      . 0.9 %  sodium chloride infusion  500 mL Intravenous Continuous Pyrtle, Lajuan Lines, MD       Facility-Administered Medications Ordered in Other Visits  Medication Dose Route Frequency Provider Last Rate Last Dose  . gadopentetate dimeglumine (MAGNEVIST) injection 20 mL  20 mL Intravenous Once PRN Sater, Nanine Means, MD         Musculoskeletal: Strength & Muscle Tone: within normal limits Gait & Station: normal Patient leans: N/A  Psychiatric Specialty  Exam: Review of Systems  Constitutional: Positive for malaise/fatigue.  All other systems reviewed and are negative.   Blood pressure (!) 150/82, pulse 72, height 5\' 3"  (1.6 m), weight 181 lb (82.1 kg), SpO2 96 %.Body mass index is 32.06 kg/m.  General Appearance: Casual, Neat and Well Groomed  Eye Contact:  Good  Speech:  Clear and Coherent  Volume:  Normal  Mood:  Anxious  Affect:  Congruent  Thought Process:  Goal Directed  Orientation:  Full (Time, Place, and Person)  Thought Content: Rumination   Suicidal Thoughts:  No  Homicidal Thoughts:  No  Memory:  Immediate;   Good Recent;   Good Remote;   Good  Judgement:  Good  Insight:  Fair  Psychomotor Activity:  Normal  Concentration:  Concentration: Good and Attention Span: Good  Recall:  Good  Fund of Knowledge: Good  Language: Good  Akathisia:  No  Handed:  Right  AIMS (if indicated): not done  Assets:  Communication Skills Desire for Improvement Resilience Social Support Talents/Skills  ADL's:  Intact  Cognition: WNL  Sleep:  Poor   Screenings: PHQ2-9     Counselor from 07/07/2017 in [facility] Office Visit from 07/02/2016 in [facility] Office Visit from 02/08/2014 in [facility] Office Visit from 03/17/2013 in [facility] Office Visit from 01/30/2013 in [facility]  PHQ-2 Total Score  4  (Pended)   0  6  2  6   PHQ-9 Total Score  19  (Pended)   No data  20  8  20        Assessment and Plan: This patient is a 57 year old female with a history of depression and anxiety.  She does think that the Cymbalta is helping her depression  but seems to be affecting her dreaming and causing more interrupted sleep.  I suggested she not take it before she goes to sleep but rather take it when she gets home from work.  She has not used the Klonopin at all but likes the idea of having it if needed.  She will try this new regimen for 4 weeks and  return.   [name], MD 07/08/2017, 9:00 AM

## 2017-07-19 ENCOUNTER — Other Ambulatory Visit: Payer: Self-pay | Admitting: Family Medicine

## 2017-07-21 ENCOUNTER — Encounter (HOSPITAL_COMMUNITY): Payer: Self-pay | Admitting: Psychiatry

## 2017-07-21 ENCOUNTER — Ambulatory Visit (INDEPENDENT_AMBULATORY_CARE_PROVIDER_SITE_OTHER): Payer: BLUE CROSS/BLUE SHIELD | Admitting: Psychiatry

## 2017-07-21 DIAGNOSIS — F331 Major depressive disorder, recurrent, moderate: Secondary | ICD-10-CM | POA: Diagnosis not present

## 2017-07-21 NOTE — Progress Notes (Signed)
                   THERAPIST PROGRESS NOTE          Session Time:    Wednesday 07/21/2017 10:18 AM  -  11:08 AM                 Participation Level: Active  Behavioral Response: Casual/ drowsy/ lethargic, increased anxiety, less depressed                 Type of Therapy: Individual Therapy        Treatment Goals:  1. Learn and implement coping and behavioral strategies to overcome depression.     2. Identify and replace thoughts and beliefs that support depression.         Treatment Goals addressed: 1,2  Interventions: Supportive,  CBT  Summary: Pamela Walters is a 57 y.o. female who is referred for services by PCP Dr. Moshe Cipro due to patient suffering symptoms of depression. Patient has been seen in the office interminttently for medication management and outpatient psychotherapy since 2016. She recently resumed services and was last seen 4 weeks ago. She reports increased depressed mood, poor motivation, anxiety, decreased interest in activities, diminished pleasure in activities, lack of appetite, excessive worry, and fatigue since last session. Patient also reports increased worry about having another panic attack as she had one about 2 months ago. She expresses frustration with self as she was not able to enjoy the holidays as she has in the past. 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 She also reports she has not   been practicing relaxation techniques consistently.        Patient reports decreased vivid dreams since changing time of taking medication as instructed by psychiatrist Dr. Harrington Challenger. She also reports feeling less depressed. She continues to experience sleep difficulty and fatigue. She continues to express frustration she is not engaged with her family and in life like she wants to be due to fatigue. She says she often falls asleep in the recliner and states sometimes dreading going to bed. She states still thinking it may be fear of having  a nightmare. She also does not have a bedtime ritual  Therapist Response:   Reviewed symptoms, assisted patient examine her behaviors regarding sleep pattens and activities, discussed ways to improve sleep hygiene, assisted patient develop bedtime schedule and ritual, assisted patient identify ways to improve environment in her bedroom, discussed and provided patient with handout on sleep hygiene, discussed self-compassion and realistic expectations of self, discussed ways to cope with bad dreams (preparing self in case she has bad dreams, thinking of different ending, grounding techniques) assigned patient to implement strategies discussed in session,   Plan: Return again in 2 weeks.   Diagnosis: Axis I: Major Depressive Disorder, Recurrent, Moderate    Axis II: No diagnosis    [name], LCSW 07/21/2017

## 2017-08-04 ENCOUNTER — Ambulatory Visit (HOSPITAL_COMMUNITY): Payer: BLUE CROSS/BLUE SHIELD | Admitting: Psychiatry

## 2017-08-09 ENCOUNTER — Ambulatory Visit (HOSPITAL_COMMUNITY): Payer: BLUE CROSS/BLUE SHIELD | Admitting: Psychiatry

## 2017-08-10 ENCOUNTER — Encounter (HOSPITAL_COMMUNITY): Payer: Self-pay | Admitting: Psychiatry

## 2017-08-10 ENCOUNTER — Ambulatory Visit (INDEPENDENT_AMBULATORY_CARE_PROVIDER_SITE_OTHER): Payer: BLUE CROSS/BLUE SHIELD | Admitting: Psychiatry

## 2017-08-10 VITALS — BP 144/79 | HR 78 | Ht 63.0 in | Wt 181.0 lb

## 2017-08-10 DIAGNOSIS — G4700 Insomnia, unspecified: Secondary | ICD-10-CM

## 2017-08-10 DIAGNOSIS — Z818 Family history of other mental and behavioral disorders: Secondary | ICD-10-CM | POA: Diagnosis not present

## 2017-08-10 DIAGNOSIS — F331 Major depressive disorder, recurrent, moderate: Secondary | ICD-10-CM

## 2017-08-10 MED ORDER — PRAZOSIN HCL 1 MG PO CAPS: 1.0000 mg | ORAL_CAPSULE | Freq: Every day | ORAL | 2 refills | Status: DC

## 2017-08-10 MED ORDER — DULOXETINE HCL 60 MG PO CPEP: 60.0000 mg | ORAL_CAPSULE | Freq: Every day | ORAL | 2 refills | Status: DC

## 2017-08-10 NOTE — Progress Notes (Signed)
BH MD/PA/NP OP Progress Note  08/10/2017 9:08 AM Pamela Walters  MRN:  [id number]  Chief Complaint:  Chief Complaint    Depression; Anxiety; Follow-up     HPI: This patient is a 57 year old married white female who lives with her husband in [place]. 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  The patient returns for follow-up of treatment for depression and anxiety. She was last seen in August and was switched from Prozac to Cymbalta. She did not feel like the Prozac was helping that much anymore and she was having tonic pain. She states she had been taking the Cymbalta consistently because of her work schedule she kept forgetting. However back in November she was out with her family and had a severe panic attack and had to go to the hospital emergency room for workup. Nothing really showed up but this scared her. For the last couple of weeks she has been taking the Cymbalta every day and she is starting to feel better. She still has periods of feeling shaky and jittery even in her own home. She is generally sleeping well. Her neurologist is still following her MS and she is going to have repeat brain scan in the next month. She occasionally has symptoms like numbness in her hands and feet and gets a bit off balance when she is tired. She is still able to work  The patient returns after 4 weeks.  Last time she was not sleeping well.  I urged her to take the Cymbalta before work at night before sleep and also to take her Nuvigil every day during her daytime hours.  This is helped somewhat.  The main problem is that she works nights in the day she is off she tries to stay up during the day and sleep at night so she is going back and forth between sleeping in the day and sleeping at night.  This has messed up  her sleep cycle.  She still has bad dreams much of the time especially when she has to sleep during the day.  I suggested we add prazosin and she agrees.  She  denies being depressed or having any further panic attacks.  She generally likes her job and likes working at nights.  She denies suicidal ideation Visit Diagnosis:    ICD-10-CM   1. Major depressive disorder, recurrent episode, moderate (HCC) F33.1     Past Psychiatric History: none  Past Medical History:  Past Medical History:  Diagnosis Date  . Allergy   . Back pain   . Depression   . GERD (gastroesophageal reflux disease)   . Hepatic steatosis   . Hiatal hernia   . Hyperlipidemia   . Hypertension   . Internal hemorrhoids   . MS (multiple sclerosis) ([facility])    2007  . Multiple sclerosis ([facility])   . Schatzki's ring   . Sleep apnea    wears CPAP    Past Surgical History:  Procedure Laterality Date  . ABDOMINAL HYSTERECTOMY  2005   fibroids  . CARDIAC CATHETERIZATION N/A 05/04/2016   Procedure: Left Heart Cath and Coronary Angiography;  Surgeon: Jettie Booze, MD;  Location: [facility];  Service: Cardiovascular;  Laterality: N/A;  . ESOPHAGOGASTRODUODENOSCOPY N/A 09/18/2013   Procedure: ESOPHAGOGASTRODUODENOSCOPY (EGD);  Surgeon: Jerene Bears, MD;  Location: [facility];  Service: Gastroenterology;  Laterality: N/A;  . UPPER GASTROINTESTINAL ENDOSCOPY    . WISDOM TOOTH EXTRACTION  Family Psychiatric History: Mother has a history of depression  Family History:  Family History  Problem Relation Age of Onset  . Hypertension Mother   . Hyperlipidemia Mother   . Depression Mother   . Hypertension Father   . Colon cancer Neg Hx   . Esophageal cancer Neg Hx   . Rectal cancer Neg Hx   . Stomach cancer Neg Hx     Social History:  Social History   Socioeconomic History  . Marital status: Married    Spouse name: Not on file  . Number of children: Not on file  . Years of education: Not on file  . Highest education level: Not on file  Occupational History  . Not on file  Social Needs  . Financial resource strain: Not on file  . Food insecurity:     Worry: Not on file    Inability: Not on file  . Transportation needs:    Medical: Not on file    Non-medical: Not on file  Tobacco Use  . Smoking status: Never Smoker  . Smokeless tobacco: Never Used  Substance and Sexual Activity  . Alcohol use: No  . Drug use: No  . Sexual activity: Yes    Birth control/protection: Surgical  Lifestyle  . Physical activity:    Days per week: Not on file    Minutes per session: Not on file  . Stress: Not on file  Relationships  . Social connections:    Talks on phone: Not on file    Gets together: Not on file    Attends religious service: Not on file    Active member of club or organization: Not on file    Attends meetings of clubs or organizations: Not on file    Relationship status: Not on file  Other Topics Concern  . Not on file  Social History Narrative  . Not on file    Allergies: No Known Allergies  Metabolic Disorder Labs: Lab Results  Component Value Date   HGBA1C 5.5 05/10/2017   MPG 111 05/10/2017   MPG 108 02/20/2016   No results found for: PROLACTIN Lab Results  Component Value Date   CHOL 138 05/10/2017   TRIG 123 05/10/2017   HDL 47 (L) 05/10/2017   CHOLHDL 2.9 05/10/2017   VLDL 34 (H) 06/30/2016   LDLCALC 71 05/10/2017   LDLCALC 194 (H) 01/29/2017   Lab Results  Component Value Date   TSH 2.36 05/10/2017   TSH 4.02 02/20/2016    Therapeutic Level Labs: No results found for: LITHIUM No results found for: VALPROATE No components found for:  CBMZ  Current Medications: Current Outpatient Medications  Medication Sig Dispense Refill  . acyclovir (ZOVIRAX) 400 MG tablet Take 1 tablet (400 mg total) by mouth 3 (three) times daily. 21 tablet 1  . Armodafinil 250 MG tablet Take 1 tablet (250 mg total) by mouth daily. 30 tablet 5  . aspirin 81 MG tablet Take 81 mg by mouth daily.    . clonazePAM (KLONOPIN) 0.5 MG tablet Take 1 tablet (0.5 mg total) by mouth daily as needed for anxiety. 30 tablet 2  .  DULoxetine (CYMBALTA) 60 MG capsule Take 1 capsule (60 mg total) by mouth daily. 30 capsule 2  . ezetimibe (ZETIA) 10 MG tablet Take 1 tablet (10 mg total) by mouth daily. 30 tablet 5  . folic acid-pyridoxine-cyancobalamin (FOLTX) 2.5-25-2 MG TABS tablet Take 1 tablet by mouth daily. 90 each 3  . gabapentin (NEURONTIN) 600  MG tablet Take 1 tablet (600 mg total) by mouth 3 (three) times daily. 270 tablet 3  . isosorbide mononitrate (IMDUR) 30 MG 24 hr tablet Take 0.5 tablets (15 mg total) by mouth daily. 15 tablet 3  . metaxalone (SKELAXIN) 800 MG tablet Take 800 mg by mouth 2 (two) times daily as needed for muscle spasms.     . mometasone (NASONEX) 50 MCG/ACT nasal spray USE 2 SPRAYS NASALLY DAILY 51 g 1  . Multiple Vitamin (MULTIVITAMIN WITH MINERALS) TABS tablet Take 1 tablet by mouth daily.    Marland Kitchen oxybutynin (DITROPAN-XL) 10 MG 24 hr tablet Take 1 tablet (10 mg total) by mouth at bedtime. 90 tablet 3  . pantoprazole (PROTONIX) 40 MG tablet TAKE 1 TABLET BY MOUTH  EVERY MORNING 90 tablet 0  . ranitidine (ZANTAC) 150 MG tablet Take 1 tablet (150 mg total) by mouth at bedtime. 90 tablet 0  . rosuvastatin (CRESTOR) 40 MG tablet TAKE 1 TABLET BY MOUTH ONCE DAILY 30 tablet 11  . traMADol (ULTRAM) 50 MG tablet 1-2 pills as needed up to 4/day 90 tablet 3  . triamterene-hydrochlorothiazide (MAXZIDE-25) 37.5-25 MG tablet Take 1 tablet by mouth daily. 90 tablet 1  . metoprolol tartrate (LOPRESSOR) 25 MG tablet Take 0.5 tablets (12.5 mg total) by mouth 2 (two) times daily. 90 tablet 3  . prazosin (MINIPRESS) 1 MG capsule Take 1 capsule (1 mg total) by mouth at bedtime. 30 capsule 2   Current Facility-Administered Medications  Medication Dose Route Frequency Provider Last Rate Last Dose  . 0.9 %  sodium chloride infusion  500 mL Intravenous Continuous Pyrtle, Lajuan Lines, MD      . 0.9 %  sodium chloride infusion  500 mL Intravenous Continuous Pyrtle, Lajuan Lines, MD       Facility-Administered Medications Ordered in  Other Visits  Medication Dose Route Frequency Provider Last Rate Last Dose  . gadopentetate dimeglumine (MAGNEVIST) injection 20 mL  20 mL Intravenous Once PRN Sater, Nanine Means, MD         Musculoskeletal: Strength & Muscle Tone: within normal limits Gait & Station: normal Patient leans: N/A  Psychiatric Specialty Exam: Review of Systems  Psychiatric/Behavioral: The patient has insomnia.   All other systems reviewed and are negative.   Blood pressure (!) 144/79, pulse 78, height 5\' 3"  (1.6 m), weight 181 lb (82.1 kg), SpO2 95 %.Body mass index is 32.06 kg/m.  General Appearance: Casual, Neat and Well Groomed  Eye Contact:  Good  Speech:  Clear and Coherent  Volume:  Normal  Mood:  Anxious  Affect:  Congruent  Thought Process:  Goal Directed  Orientation:  Full (Time, Place, and Person)  Thought Content: Rumination   Suicidal Thoughts:  No  Homicidal Thoughts:  No  Memory:  Immediate;   Good Recent;   Good Remote;   Good  Judgement:  Good  Insight:  Good  Psychomotor Activity:  Normal  Concentration:  Concentration: Good and Attention Span: Good  Recall:  Good  Fund of Knowledge: Good  Language: Good  Akathisia:  No  Handed:  Right  AIMS (if indicated): not done  Assets:  Communication Skills Desire for Improvement Physical Health Resilience Social Support Talents/Skills  ADL's:  Intact  Cognition: WNL  Sleep:  Fair   Screenings: PHQ2-9     Counselor from 07/07/2017 in [facility] Office Visit from 07/02/2016 in [facility] Office Visit from 02/08/2014 in [facility] Office Visit from 03/17/2013 in  [facility] Office Visit from 01/30/2013 in [facility]  PHQ-2 Total Score  4  (Pended)   0  6  2  6   PHQ-9 Total Score  19  (Pended)   -  20  8  20        Assessment and Plan: This patient is a 57 year old female with a history of depression and anxiety.  Some of the symptoms  have subsided but she is still having broken sleep with bad dreams and sometimes nightmares.  We will add terazosin 1 mg prior to sleep to try to help this.  She will continue Cymbalta 60 mg daily and clonazepam 0.5 mg daily as needed for anxiety.  She states that she has not use the clonazepam.  She will continue her counseling here and return to see me in 2 months   [name], MD 08/10/2017, 9:08 AM

## 2017-08-16 ENCOUNTER — Ambulatory Visit (HOSPITAL_COMMUNITY): Payer: BLUE CROSS/BLUE SHIELD | Admitting: Psychiatry

## 2017-08-18 ENCOUNTER — Ambulatory Visit (HOSPITAL_COMMUNITY): Payer: Self-pay | Admitting: Psychiatry

## 2017-08-23 DIAGNOSIS — M5136 Other intervertebral disc degeneration, lumbar region: Secondary | ICD-10-CM | POA: Diagnosis not present

## 2017-08-23 DIAGNOSIS — M4316 Spondylolisthesis, lumbar region: Secondary | ICD-10-CM | POA: Diagnosis not present

## 2017-08-23 DIAGNOSIS — M545 Low back pain: Secondary | ICD-10-CM | POA: Diagnosis not present

## 2017-08-25 DIAGNOSIS — G510 Bell's palsy: Secondary | ICD-10-CM

## 2017-08-25 HISTORY — DX: Bell's palsy: G51.0

## 2017-08-28 DIAGNOSIS — M545 Low back pain: Secondary | ICD-10-CM | POA: Diagnosis not present

## 2017-08-31 ENCOUNTER — Other Ambulatory Visit: Payer: Self-pay | Admitting: Neurology

## 2017-09-01 ENCOUNTER — Encounter (HOSPITAL_COMMUNITY): Payer: Self-pay | Admitting: Psychiatry

## 2017-09-01 ENCOUNTER — Encounter (HOSPITAL_COMMUNITY): Payer: Self-pay

## 2017-09-01 ENCOUNTER — Ambulatory Visit (INDEPENDENT_AMBULATORY_CARE_PROVIDER_SITE_OTHER): Payer: BLUE CROSS/BLUE SHIELD | Admitting: Psychiatry

## 2017-09-01 ENCOUNTER — Ambulatory Visit (HOSPITAL_COMMUNITY)
Admission: RE | Admit: 2017-09-01 | Discharge: 2017-09-01 | Disposition: A | Discharge status: Home / Self Care | Payer: BLUE CROSS/BLUE SHIELD | Source: Ambulatory Visit | Attending: Family Medicine | Admitting: Family Medicine

## 2017-09-01 DIAGNOSIS — Z1231 Encounter for screening mammogram for malignant neoplasm of breast: Secondary | ICD-10-CM | POA: Insufficient documentation

## 2017-09-01 DIAGNOSIS — F331 Major depressive disorder, recurrent, moderate: Secondary | ICD-10-CM

## 2017-09-01 NOTE — Progress Notes (Signed)
                   THERAPIST PROGRESS NOTE          Session Time:    Wednesday 09/01/2017 9:17 AM - 10:00 AM                 Participation Leve                 l: Active  Behavioral Response: Casual/ drowsy/ lethargic, increased anxiety, less depressed                 Type of Therapy: Individual Therapy        Treatment Goals:  1. Learn and implement coping and behavioral strategies to overcome depression.     2. Identify and replace thoughts and beliefs that support depression.         Treatment Goals addressed: 1,2  Interventions: Supportive,  CBT  Summary: Pamela Walters is a 57 y.o. female who is referred for services by PCP Dr. Moshe Cipro due to patient suffering symptoms of depression. Patient has been seen in the office interminttently for medication management and outpatient psychotherapy since 2016. She recently resumed services and was last seen 4 weeks ago. She reports increased depressed mood, poor motivation, anxiety, decreased interest in activities, diminished pleasure in activities, lack of appetite, excessive worry, and fatigue since last session. Patient also reports increased worry about having another panic attack as she had one about 2 months ago. She expresses frustration with self as she was not able to enjoy the holidays as she has in the past. 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 She also reports she has not   been practicing relaxation techniques consistently.        Patient last was seen 3-4 weeks ago. She states feeling better since clarifying and adjusting medication regimen from psychiatrist Dr. Harrington Challenger. Patient also report improving sleep hygiene efforts including using a sound machine and a neck pillow. She reports decreased nightmares since taking Minipress and also says she obsesses less about big dreams she does have. Patient also is wearing her CPAP. However, she still has difficulty establishing and maintaining a  healthy consistent bedtime. She states always feeling as though she has something else she needs to do before she goes to bed. She also reports experiencing increased back pain and currently is taking prednisone until she can see her orthopedist later this month to get results about recent MRI. Patient fears she may need surgery.  Therapist Response:   Reviewed symptoms, praised and reinforced patient's efforts to improve her sleeping environment, assisted patient identify thoughts and processes that inhibit implementation of bedtime schedule, discussed the connection between thoughts/mood/behavior using examples from patient's life, assisted patient challenge and replace maladaptive thoughts that inhibit development of consistent bedtime with healthy alternatives, assigned patient to read alternative statement developed in session daily and implement strategies developed in session  Plan: Return again in 2 weeks.   Diagnosis: Axis I: Major Depressive Disorder, Recurrent, Moderate    Axis II: No diagnosis    Sadao Weyer, LCSW 09/01/2017

## 2017-09-02 ENCOUNTER — Other Ambulatory Visit: Payer: Self-pay

## 2017-09-02 MED ORDER — PANTOPRAZOLE SODIUM 40 MG PO TBEC: 40.0000 mg | DELAYED_RELEASE_TABLET | Freq: Every morning | ORAL | 0 refills | Status: DC

## 2017-09-03 DIAGNOSIS — M5136 Other intervertebral disc degeneration, lumbar region: Secondary | ICD-10-CM | POA: Diagnosis not present

## 2017-09-03 DIAGNOSIS — M479 Spondylosis, unspecified: Secondary | ICD-10-CM | POA: Diagnosis not present

## 2017-09-12 DIAGNOSIS — G4733 Obstructive sleep apnea (adult) (pediatric): Secondary | ICD-10-CM | POA: Diagnosis not present

## 2017-09-12 DIAGNOSIS — G510 Bell's palsy: Secondary | ICD-10-CM | POA: Diagnosis not present

## 2017-09-12 DIAGNOSIS — R51 Headache: Secondary | ICD-10-CM | POA: Diagnosis not present

## 2017-09-12 DIAGNOSIS — E785 Hyperlipidemia, unspecified: Secondary | ICD-10-CM | POA: Diagnosis not present

## 2017-09-12 DIAGNOSIS — M549 Dorsalgia, unspecified: Secondary | ICD-10-CM | POA: Diagnosis not present

## 2017-09-12 DIAGNOSIS — I10 Essential (primary) hypertension: Secondary | ICD-10-CM | POA: Diagnosis not present

## 2017-09-15 ENCOUNTER — Ambulatory Visit (HOSPITAL_COMMUNITY): Payer: Self-pay | Admitting: Psychiatry

## 2017-09-29 ENCOUNTER — Encounter (HOSPITAL_COMMUNITY): Payer: Self-pay | Admitting: Psychiatry

## 2017-09-29 ENCOUNTER — Ambulatory Visit (HOSPITAL_COMMUNITY): Payer: BLUE CROSS/BLUE SHIELD | Admitting: Psychiatry

## 2017-09-29 DIAGNOSIS — F331 Major depressive disorder, recurrent, moderate: Secondary | ICD-10-CM | POA: Diagnosis not present

## 2017-09-29 NOTE — Progress Notes (Signed)
                    THERAPIST PROGRESS NOTE          Session Time:    Wednesday 09/29/2017 9:21 AM -  10:05 AM            Participation Leve                 l: Active  Behavioral Response: Casual/ drowsy/ lethargic, increased anxiety, less depressed                 Type of Therapy: Individual Therapy        Treatment Goals:  1. Learn and implement coping and behavioral strategies to overcome depression.     2. Identify and replace thoughts and beliefs that support depression.         Treatment Goals addressed: 1,2  Interventions: Supportive,  CBT  Summary: Pamela Walters is a 57 y.o. female who is referred for services by PCP Dr. Moshe Cipro due to patient suffering symptoms of depression. Patient has been seen in the office interminttently for medication management and outpatient psychotherapy since 2016. She recently resumed services and was last seen 4 weeks ago. She reports increased depressed mood, poor motivation, anxiety, decreased interest in activities, diminished pleasure in activities, lack of appetite, excessive worry, and fatigue since last session. Patient also reports increased worry about having another panic attack as she had one about 2 months ago. She expresses frustration with self as she was not able to enjoy the holidays as she has in the past. 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 She also reports she has not   been practicing relaxation techniques consistently.        Patient last was seen 3-4 weeks ago. She reports increased stress and anxiety regarding increased health issues. Patient reports increased back spasms and recently having an episode of Bell's palsy. She has become more worried about her health and possible effects on her future. She expresses desire to take some time off work to focus on self-care and her health. Per her report, her husband is very supportive of this but patient remains reluctant to do so due to her  fears about the future. She reports additional stress related to daughter resuming behavioral patterns of asking patient and her husband for money. Patient worries about possible effects on her marriage. Per her report, she is able to set limits with daughter regarding money but says husband is unable to do so. She worries about the effects of this on their relationship as well as their financial future.   Therapist Response:   Reviewed symptoms, discussed stressors, assisted patient identify target 1. (improve health) and objectives (participate in physical therapy and do approved physical exercises at home consistently to strengthen core, have time for self-care including time off work ), began to discuss other target 2. Engage and enjoy life with family, assisted patient develop plan to ask physical therapist about exercises to do at home, have conversation with husband about discuss nuts and bolts regarding plan for patient to take time off work, assigned patient to implement strategies discussed in session.  Plan: Return again in 2 weeks.   Diagnosis: Axis I: Major Depressive Disorder, Recurrent, Moderate    Axis II: No diagnosis    Lucila Klecka, LCSW 09/29/2017

## 2017-10-11 ENCOUNTER — Other Ambulatory Visit: Payer: Self-pay | Admitting: Neurology

## 2017-10-13 ENCOUNTER — Ambulatory Visit (INDEPENDENT_AMBULATORY_CARE_PROVIDER_SITE_OTHER): Payer: BLUE CROSS/BLUE SHIELD | Admitting: Psychiatry

## 2017-10-13 DIAGNOSIS — E785 Hyperlipidemia, unspecified: Secondary | ICD-10-CM | POA: Diagnosis not present

## 2017-10-13 DIAGNOSIS — I10 Essential (primary) hypertension: Secondary | ICD-10-CM | POA: Diagnosis not present

## 2017-10-13 DIAGNOSIS — F331 Major depressive disorder, recurrent, moderate: Secondary | ICD-10-CM | POA: Diagnosis not present

## 2017-10-13 LAB — LIPID PANEL
CHOL/HDL RATIO: 2.3 (calc) (ref <5.0)
Cholesterol: 104 mg/dL (ref <200)
HDL: 45 mg/dL — ABNORMAL LOW (ref >50)
LDL Cholesterol (Calc): 39 mg/dL (calc)
NON-HDL CHOLESTEROL (CALC): 59 mg/dL (ref <130)
Triglycerides: 123 mg/dL (ref <150)

## 2017-10-13 LAB — COMPLETE METABOLIC PANEL WITH GFR
AG RATIO: 1.9 (calc) (ref 1.0–2.5)
ALT: 15 U/L (ref 6–29)
AST: 21 U/L (ref 10–35)
Albumin: 4.2 g/dL (ref 3.6–5.1)
Alkaline phosphatase (APISO): 70 U/L (ref 33–130)
BUN: 15 mg/dL (ref 7–25)
CALCIUM: 9.3 mg/dL (ref 8.6–10.4)
CO2: 34 mmol/L — AB (ref 20–32)
CREATININE: 0.94 mg/dL (ref 0.50–1.05)
Chloride: 100 mmol/L (ref 98–110)
GFR, EST NON AFRICAN AMERICAN: 66 mL/min/{1.73_m2} (ref >=60)
GFR, Est African American: 77 mL/min/{1.73_m2} (ref >=60)
Globulin: 2.2 g/dL (calc) (ref 1.9–3.7)
Glucose, Bld: 96 mg/dL (ref 65–99)
POTASSIUM: 3.3 mmol/L — AB (ref 3.5–5.3)
Sodium: 142 mmol/L (ref 135–146)
TOTAL PROTEIN: 6.4 g/dL (ref 6.1–8.1)
Total Bilirubin: 0.5 mg/dL (ref 0.2–1.2)

## 2017-10-13 NOTE — Progress Notes (Signed)
THERAPIST PROGRESS NOTE          Session Time:    Wednesday 10/13/2017 9:15 AM -  10:05 AM                          Participation Level: Active  Behavioral Response: Casual/ alert, talkative,                 Type of Therapy: Individual Therapy        Treatment Goals:  1. Learn and implement coping and behavioral strategies to overcome depression.     2. Identify and replace thoughts and beliefs that support depression.         Treatment Goals addressed: 1,2  Interventions: Supportive,  CBT  Summary: Pamela Walters is a 57 y.o. female who is referred for services by PCP Dr. Moshe Cipro due to patient suffering symptoms of depression. Patient has been seen in the office interminttently for medication management and outpatient psychotherapy since 2016. She recently resumed services and was last seen 4 weeks ago. She reports increased depressed mood, poor motivation, anxiety, decreased interest in activities, diminished pleasure in activities, lack of appetite, excessive worry, and fatigue since last session. Patient also reports increased worry about having another panic attack as she had one about 2 months ago. She expresses frustration with self as she was not able to enjoy the holidays as she has in the past. 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 She also reports she has not   been practicing relaxation techniques consistently.        Patient last was seen 2 weeks ago. She reports feeling better since last session. She expresses less worry about her health as symptoms of Bell's Palsy have subsided. She expresses acceptance of need to make lifestyle changes to improve overall health and decrease stress. She has talked with husband and they are developing plan for patient to reduce hours at work to 3-4 days per two week period beginning in July. She is pleased she took a week off work last week. Patient also has updated her resume and  is looking for another job that would be more conducive to patient meet her needs and working day shift. 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 She has improved self-care regarding eating patterns and sleep patterns. She plans to start physical therapy tomorrow. She reports enjoying recent Father's Day celebration with her husband, son and his family. She expresses anger and sadness she is not able to do the same type of activities with her daughter. She reports thoughts about this sometimes interfere with being able to engage and enjoy life with her family.   Therapist Response:   Reviewed symptoms, praised and reinforce patient's completion of homework ( efforts to develop plan regarding adjusting work schedule to take time off work), discussed effects on mood, behavior and thoughts, praised and reinforced patient considering other job opportunities, encouraged patient to follow through with physical therapy/discuss and implement physical therapist's recommendation regarding at home exercises, discussed patient's target of engaging and enjoying life with family, begin to discuss objectives/goals (improve relationship with daughter/ identify clear values and goals relevant to the relationship) ( regulate acute emotions: enhance emotional awareness and discrimination of emotions, use cognitive reappraisal)  Plan: Return again in  2 weeks.   Diagnosis: Axis I: Major Depressive Disorder, Recurrent, Moderate    Axis II: No diagnosis    Morine Kohlman, LCSW 10/13/2017

## 2017-10-14 ENCOUNTER — Other Ambulatory Visit: Payer: Self-pay

## 2017-10-14 ENCOUNTER — Encounter: Payer: Self-pay | Admitting: Physical Therapy

## 2017-10-14 ENCOUNTER — Ambulatory Visit (HOSPITAL_COMMUNITY): Payer: BLUE CROSS/BLUE SHIELD | Admitting: Psychiatry

## 2017-10-14 ENCOUNTER — Ambulatory Visit: Payer: BLUE CROSS/BLUE SHIELD | Attending: Physician Assistant | Admitting: Physical Therapy

## 2017-10-14 DIAGNOSIS — G8929 Other chronic pain: Secondary | ICD-10-CM

## 2017-10-14 DIAGNOSIS — M545 Low back pain, unspecified: Secondary | ICD-10-CM

## 2017-10-14 NOTE — Patient Instructions (Signed)
[facility]  DRY NEEDLING CONSENT FORM   Trigger point dry needling is a physical therapy approach to treat Myofascial Pain and Dysfunction.  Dry Needling (DN) is a valuable and effective way to deactivate myofascial trigger points (muscle knots/pain). It is skilled intervention that uses a thin filiform needle to penetrate the skin and stimulate underlying myofascial trigger points, muscular, and connective tissues for the management of neuromusculoskeletal pain and movement impairments.  A local twitch response (LTR) will be elicited.  This can sometimes feel like a deep ache in the muscle during the procedure. Multiple trigger points in multiple muscles can be treated during each treatment.  No medication of any kind is injected.   As with any medical treatment and procedure, there are possible adverse events.  While significant adverse events are uncommon, they do sometimes occur and must be considered prior to giving consent.  1. Dry needling often causes a "post needling soreness".  There can be an increase in pain from a couple of hours to 2-3 days, followed by an improvement in the overall pain state. 2. Any time a needle is used there is a risk of infection.  However, we are using new, sterile, and disposable needles; infections are extremely rare. 3. There is a possibility that you may bleed or bruise.  You may feel tired and some nausea following treatment. 4. There is a rare possibility of a pneumothorax (air in the chest cavity). 5. Allergic reaction to nickel in the stainless steel needle. 6. If a nerve is touched, it may cause paresthesia (a prickling/shock sensation) which is usually brief, but may continue for a couple of days.  Following treatment stay hydrated.  Continue regular activities but not too vigorous initially after treatment for 24-48 hours.  Dry Needling is best when combined with other physical therapy interventions such as  strengthening, stretching and other therapeutic modalities.   PLEASE ANSWER THE FOLLOWING QUESTIONS:  Do you have a lack of sensation?   Y/N  Do you have a phobia or fear of needles  Y/N  Are you pregnant?    Y/N If yes:  How many weeks? __________ Do you have any implanted devices?  Y/N If yes:  Pacemaker/Spinal Cord Stimulator/Deep Brain Stimulator/Insulin Pump/Other: ________________ Do you have any implants?  Y/N If yes: Breast/Facial/Pecs/Buttocks/Calves/Hip  Replacement/ Knee Replacement/Other: _________ Do you take any blood thinners?   Y/N If yes: Coumadin (Warfarin)/Other: ___________________ Do you have a bleeding disorder?   Y/N If yes: What kind: _________________________________ Do you take any immunosuppressants?  Y/N If yes:   What kind: _________________________________ Do you take anti-inflammatories?   Y/N If yes: What kind: Advil/Aspirin/Other: ________________ Have you ever been diagnosed with Scoliosis? Y/N Have you had back surgery?   Y/N If yes:  Laminectomy/Fusion/Other: ___________________   I have read, or had read to me, the above.  I have had the opportunity to ask any questions.  All of my questions have been answered to my satisfaction and I understand the risks involved with dry needling.  I consent to examination and treatment at [facility], including dry needling, of any and all of my involved and affected muscles.  

## 2017-10-14 NOTE — Therapy (Signed)
[facility] [place], [place], [address] Phone: [phone]   Fax:  [phone]  Physical Therapy Evaluation  Patient Details  Name: Pamela Walters MRN: [id number] Date of Birth: 02-07-1961 Referring Provider: Ronette Deter PA-C   Encounter Date: 10/14/2017  PT End of Session - 10/14/17 1236    Visit Number  1    Number of Visits  12    Date for PT Re-Evaluation  11/25/17    Authorization Type  FOTO AT LEAST EVERY 5TH VISIT, 10TH VISIT PROGRESS NOTE.    PT Start Time  1115    PT Stop Time  1203    PT Time Calculation (min)  48 min    Activity Tolerance  Patient tolerated treatment well    Behavior During Therapy  WFL for tasks assessed/performed       Past Medical History:  Diagnosis Date  . Allergy   . Back pain   . Depression   . GERD (gastroesophageal reflux disease)   . Hepatic steatosis   . Hiatal hernia   . Hyperlipidemia   . Hypertension   . Internal hemorrhoids   . MS (multiple sclerosis) ([place])    2007  . Multiple sclerosis ([place])   . Schatzki's ring   . Sleep apnea    wears CPAP    Past Surgical History:  Procedure Laterality Date  . ABDOMINAL HYSTERECTOMY  2005   fibroids  . CARDIAC CATHETERIZATION N/A 05/04/2016   Procedure: Left Heart Cath and Coronary Angiography;  Surgeon: Jettie Booze, MD;  Location: [facility];  Service: Cardiovascular;  Laterality: N/A;  . ESOPHAGOGASTRODUODENOSCOPY N/A 09/18/2013   Procedure: ESOPHAGOGASTRODUODENOSCOPY (EGD);  Surgeon: Jerene Bears, MD;  Location: [facility];  Service: Gastroenterology;  Laterality: N/A;  . UPPER GASTROINTESTINAL ENDOSCOPY    . WISDOM TOOTH EXTRACTION      There were no vitals filed for this visit.   Subjective Assessment - 10/14/17 1207    Subjective  The patient presents to OPPT with chronic low back pain.  Her pain-level is a 6/10 and at times severe when her back goes into "spasm".  Heat and pain medications  decrease her pain.  She tried chiropratic care but it made her worse.  Movement increases her pain.      Pertinent History  MS.    Limitations  Sitting;Standing    How long can you sit comfortably?  10 minutes.    How long can you stand comfortably?  10 minutes.    Diagnostic tests  MRI:  Cogential T11-12 fusion; neural foraminal narrowing; Grade 1 anterolisthesis L4 on L5 and severe bilateral L4-5 and severe right L5-S1 facet arthrosis.    Patient Stated Goals  Decrease my pai and get around better.    Currently in Pain?  Yes    Pain Score  6     Pain Location  Back    Pain Orientation  Right;Left;Mid    Pain Descriptors / Indicators  Aching;Spasm    Pain Type  Chronic pain    Pain Radiating Towards  Into bilateral buttocks.    Pain Onset  More than a month ago    Pain Frequency  Constant    Aggravating Factors   See above.    Pain Relieving Factors  See above.         [facility] PT Assessment - 10/14/17 0001      Assessment   Medical Diagnosis  Degeneration of lumbar intervertebral disc  Referring Provider  Ronette Deter PA-C    Onset Date/Surgical Date  -- Ongoing.      Precautions   Precautions  None      Restrictions   Weight Bearing Restrictions  No      Balance Screen   Has the patient fallen in the past 6 months  No    Has the patient had a decrease in activity level because of a fear of falling?   No    Is the patient reluctant to leave their home because of a fear of falling?   No      Home Environment   Living Environment  Private residence      Prior Function   Level of Independence  Independent      Observation/Other Assessments   Focus on Therapeutic Outcomes (FOTO)   66% limitation.      Posture/Postural Control   Posture/Postural Control  Postural limitations    Postural Limitations  Decreased lumbar lordosis      ROM / Strength   AROM / PROM / Strength  AROM;Strength      AROM   Overall AROM Comments  Active lumbar extension=  15 degrees; active  lumbar flexion limited by 75%.      Strength   Overall Strength Comments  Bilateral LE strength is normal.      Palpation   Palpation comment  bilateral lumbar erector spinae and QL's with a significnat amount of tone right > left.      Special Tests    Special Tests  -- Normal LE DTR's; (-) FABER and SLR testing bilaterally.      Ambulation/Gait   Gait Comments  The patient walks in some trunk flexion.                Objective measurements completed on examination: See above findings.      [facility] Adult PT Treatment/Exercise - 10/14/17 0001      Modalities   Modalities  Electrical Stimulation;Moist Heat      Moist Heat Therapy   Number Minutes Moist Heat  20 Minutes    Moist Heat Location  Lumbar Spine      Electrical Stimulation   Electrical Stimulation Location  Lumbar    Electrical Stimulation Action  IFC at 80-150 Hz x 20 minutes.    Electrical Stimulation Goals  Tone;Pain                  PT Long Term Goals - 10/14/17 1514      PT LONG TERM GOAL #1   Title  Ind with HEP.    Time  6    Period  Weeks    Status  New      PT LONG TERM GOAL #2   Title  Sit 30 minutes wiht pain not > 3/10.    Time  6    Period  Weeks    Status  New      PT LONG TERM GOAL #3   Title  Stand 20 minutes with pain not > 3/10.    Time  6    Period  Weeks    Status  New      PT LONG TERM GOAL #4   Title  Perform ADL's with pain not > 4/10.    Time  6    Period  Weeks    Status  New             Plan - 10/14/17 1447  Clinical Impression Statement  The patient presents to the clinic with chronic low back pain and c/o at times of severe muscle spasms.  Lumbar extension decreases her pain and flexion is very limited and increases her pain.  Pain and deficits impair patient's functional mobility. Patient will benefit from skilled physical therapy intervention to address pain and deficits.     History and Personal Factors relevant to plan of care:  MS.     Clinical Presentation  Stable    Clinical Decision Making  Low    Rehab Potential  Good    PT Frequency  2x / week    PT Duration  6 weeks    PT Treatment/Interventions  ADLs/Self Care Home Management;Cryotherapy;Electrical Stimulation;Ultrasound;Moist Heat;Therapeutic activities;Therapeutic exercise;Patient/family education;Manual techniques;Dry needling    PT Next Visit Plan  Draw-in and pain-free lumbar extension; Combo e'stim/U/S to lumbar musculature f/b STW/M and dry needling; HMP and e'stim.  Core exercise progression.    Consulted and Agree with Plan of Care  Patient       Patient will benefit from skilled therapeutic intervention in order to improve the following deficits and impairments:  Decreased activity tolerance, Postural dysfunction, Pain  Visit Diagnosis: Chronic bilateral low back pain without sciatica - Plan: PT plan of care cert/re-cert     Problem List Patient Active Problem List   Diagnosis Date Noted  . White matter abnormality on MRI of brain 05/19/2017  . Carpal tunnel syndrome, bilateral 05/19/2017  . Abnormal stress test   . Allergic rhinitis 06/20/2015  . Dysesthesia 03/04/2015  . Obstructive sleep apnea 10/01/2014  . Increased homocysteine ([place]) 10/01/2014  . Snoring 07/13/2014  . Urinary incontinence 05/30/2014  . Back pain with radiation 02/05/2013  . DISORDER OF BONE AND CARTILAGE UNSPECIFIED 12/16/2009  . Obesity 04/08/2008  . Multiple sclerosis ([place]) 04/04/2008  . Depression with anxiety 12/18/2007  . HEADACHE 12/18/2007  . Hyperlipidemia LDL goal <100 12/16/2007  . Essential hypertension 12/16/2007    Ross Hefferan, [place] MPT 10/14/2017, 3:22 PM  [facility] [address] [place], [place], [address] Phone: [phone]   Fax:  [phone]  Name: TAYLOUR LIETZKE MRN: [id number] Date of Birth: 1960-08-08

## 2017-10-18 ENCOUNTER — Ambulatory Visit: Payer: BLUE CROSS/BLUE SHIELD | Admitting: Family Medicine

## 2017-10-18 ENCOUNTER — Encounter: Payer: Self-pay | Admitting: Family Medicine

## 2017-10-18 VITALS — BP 140/80 | HR 84 | Resp 16 | Ht 63.0 in | Wt 183.0 lb

## 2017-10-18 DIAGNOSIS — E6609 Other obesity due to excess calories: Secondary | ICD-10-CM | POA: Diagnosis not present

## 2017-10-18 DIAGNOSIS — M549 Dorsalgia, unspecified: Secondary | ICD-10-CM | POA: Diagnosis not present

## 2017-10-18 DIAGNOSIS — I10 Essential (primary) hypertension: Secondary | ICD-10-CM

## 2017-10-18 DIAGNOSIS — F418 Other specified anxiety disorders: Secondary | ICD-10-CM | POA: Diagnosis not present

## 2017-10-18 DIAGNOSIS — E785 Hyperlipidemia, unspecified: Secondary | ICD-10-CM | POA: Diagnosis not present

## 2017-10-18 DIAGNOSIS — N3000 Acute cystitis without hematuria: Secondary | ICD-10-CM

## 2017-10-18 MED ORDER — AMLODIPINE BESYLATE 2.5 MG PO TABS: 2.5000 mg | ORAL_TABLET | Freq: Every day | ORAL | 3 refills | Status: DC

## 2017-10-18 NOTE — Patient Instructions (Addendum)
F/U in 8 to 10  weeks on a Wednesday call if you need me before  Add amlodipine 2.5 mg one daily to current blood pressure medication  It is important that you exercise regularly at least 30 minutes 5 times a week. If you develop chest pain, have severe difficulty breathing, or feel very tired, stop exercising immediately and seek medical attention    Increase intake of fruit, vegetable and water  Congrats on excellent cholesterol and weight maintenance DESPITE  Urine to be sent for c/s Thank you  for choosing [facility]. We consider it a privelige to serve you.  Delivering excellent health care in a caring and  compassionate way is our goal.  Partnering with you,  so that together we can achieve this goal is our strategy.

## 2017-10-18 NOTE — Assessment & Plan Note (Signed)
incresed and uncontrolled. Currently just starting physical therapy, recent flare was worsened with chiropracter

## 2017-10-18 NOTE — Progress Notes (Signed)
ADAMARYS SHALL     MRN: [id number]      DOB: 1960/11/06   HPI Pamela Walters is here for follow up and re-evaluation of chronic medical conditions, medication management and review of any available recent lab and radiology data.  Preventive health is updated, specifically  Cancer screening and Immunization.   Had episode of Bell's Palsy in [facility] during [facility] at the [facility], started treatment 1 day after symptom 1 day later  Was on prednisone high dose and valtrex , still having problems with her eye pain Had flare of back pain starting in December and went to 4 chiropractors essions which made things worse, now started PT last week Thursday and has 6 week session planned  The PT denies any adverse reactions to current medications since the last visit.  Urgency and bad odor x 4 weeks, no flank pain or fever  Or chills  ROS See HPI  Denies recent fever or chills. Denies sinus pressure, nasal congestion, ear pain or sore throat. Denies chest congestion, productive cough or wheezing. Denies chest pains, palpitations and leg swelling Denies abdominal pain, nausea, vomiting,diarrhea or constipation.   Denies headaches, seizures, numbness, or tingling. Denies uncontrolled  depression, anxiety or insomnia. Denies skin break down or rash.   PE  BP 140/80   Pulse 84   Resp 16   Ht 5\' 3"  (1.6 m)   Wt 183 lb (83 kg)   SpO2 97%   BMI 32.42 kg/m   Patient alert and oriented and in no cardiopulmonary distress.  HEENT:  facial asymmetry, weak on right side, EOMI,   oropharynx pink and moist.  Neck supple no JVD, no mass.  Chest: Clear to auscultation bilaterally.  CVS: S1, S2 no murmurs, no S3.Regular rate.  ABD: Soft non tender.   Ext: No edema  MS: decreased ROM spine, adequate in  shoulders, hips and knees.  Skin: Intact, no ulcerations or rash noted.  Psych: Good eye contact, normal affect. Memory intact not anxious or depressed appearing.  CNS: CN 2-12 intact, power,   normal throughout.no focal deficits noted.   Assessment & Plan  Essential hypertension Add amlodipine 2.5 mg , and continue same dose of maxzide DASH diet and commitment to daily physical activity for a minimum of 30 minutes discussed and encouraged, as a part of hypertension management. The importance of attaining a healthy weight is also discussed.  BP/Weight 10/18/2017 05/26/2017 05/19/2017 02/02/2017 10/22/2016 10/05/2016 0/34/9179  Systolic BP 150 569 794 801 655 374 827  Diastolic BP 80 62 69 82 70 86 70  Wt. (Lbs) 183 184.25 183.5 183.04 189 185.13 186.5  BMI 32.42 32.64 32.51 32.42 33.48 33.63 33.89  Some encounter information is confidential and restricted. Go to Review Flowsheets activity to see all data.       Obesity Unchanged. Patient re-educated about  the importance of commitment to a  minimum of 150 minutes of exercise per week.  The importance of healthy food choices with portion control discussed. Encouraged to start a food diary, count calories and to consider  joining a support group. Sample diet sheets offered. Goals set by the patient for the next several months.   Weight /BMI 10/18/2017 05/26/2017 05/19/2017  WEIGHT 183 lb 184 lb 4 oz 183 lb 8 oz  HEIGHT 5\' 3"  5\' 3"  5\' 3"   BMI 32.42 kg/m2 32.64 kg/m2 32.51 kg/m2  Some encounter information is confidential and restricted. Go to Review Flowsheets activity to see all data.  Back pain with radiation incresed and uncontrolled. Currently just starting physical therapy, recent flare was worsened with chiropracter  Depression with anxiety Controlled, no change in medication   Hyperlipidemia LDL goal <100 Controlled, no change in medication Hyperlipidemia:Low fat diet discussed and encouraged.   Lipid Panel  Lab Results  Component Value Date   CHOL 104 10/13/2017   HDL 45 (L) 10/13/2017   LDLCALC 39 10/13/2017   TRIG 123 10/13/2017   CHOLHDL 2.3 10/13/2017

## 2017-10-18 NOTE — Assessment & Plan Note (Signed)
Controlled, no change in medication Hyperlipidemia:Low fat diet discussed and encouraged.   Lipid Panel  Lab Results  Component Value Date   CHOL 104 10/13/2017   HDL 45 (L) 10/13/2017   LDLCALC 39 10/13/2017   TRIG 123 10/13/2017   CHOLHDL 2.3 10/13/2017

## 2017-10-18 NOTE — Assessment & Plan Note (Signed)
Controlled, no change in medication  

## 2017-10-18 NOTE — Assessment & Plan Note (Addendum)
Add amlodipine 2.5 mg , and continue same dose of maxzide DASH diet and commitment to daily physical activity for a minimum of 30 minutes discussed and encouraged, as a part of hypertension management. The importance of attaining a healthy weight is also discussed.  BP/Weight 10/18/2017 05/26/2017 05/19/2017 02/02/2017 10/22/2016 10/05/2016 4/46/1901  Systolic BP 222 411 464 314 276 701 100  Diastolic BP 80 62 69 82 70 86 70  Wt. (Lbs) 183 184.25 183.5 183.04 189 185.13 186.5  BMI 32.42 32.64 32.51 32.42 33.48 33.63 33.89  Some encounter information is confidential and restricted. Go to Review Flowsheets activity to see all data.

## 2017-10-18 NOTE — Assessment & Plan Note (Signed)
Unchanged. Patient re-educated about  the importance of commitment to a  minimum of 150 minutes of exercise per week.  The importance of healthy food choices with portion control discussed. Encouraged to start a food diary, count calories and to consider  joining a support group. Sample diet sheets offered. Goals set by the patient for the next several months.   Weight /BMI 10/18/2017 05/26/2017 05/19/2017  WEIGHT 183 lb 184 lb 4 oz 183 lb 8 oz  HEIGHT 5\' 3"  5\' 3"  5\' 3"   BMI 32.42 kg/m2 32.64 kg/m2 32.51 kg/m2  Some encounter information is confidential and restricted. Go to Review Flowsheets activity to see all data.

## 2017-10-19 ENCOUNTER — Ambulatory Visit: Payer: BLUE CROSS/BLUE SHIELD | Admitting: Physical Therapy

## 2017-10-19 DIAGNOSIS — G8929 Other chronic pain: Secondary | ICD-10-CM | POA: Diagnosis not present

## 2017-10-19 DIAGNOSIS — M545 Low back pain: Secondary | ICD-10-CM | POA: Diagnosis not present

## 2017-10-19 NOTE — Patient Instructions (Addendum)
  Trigger Point Dry Needling  . What is Trigger Point Dry Needling (DN)? o DN is a physical therapy technique used to treat muscle pain and dysfunction. Specifically, DN helps deactivate muscle trigger points (muscle knots).  o A thin filiform needle is used to penetrate the skin and stimulate the underlying trigger point. The goal is for a local twitch response (LTR) to occur and for the trigger point to relax. No medication of any kind is injected during the procedure.   . What Does Trigger Point Dry Needling Feel Like?  o The procedure feels different for each individual patient. Some patients report that they do not actually feel the needle enter the skin and overall the process is not painful. Very mild bleeding may occur. However, many patients feel a deep cramping in the muscle in which the needle was inserted. This is the local twitch response.   Marland Kitchen How Will I feel after the treatment? o Soreness is normal, and the onset of soreness may not occur for a few hours. Typically this soreness does not last longer than two days.  o Bruising is uncommon, however; ice can be used to decrease any possible bruising.  o In rare cases feeling tired or nauseous after the treatment is normal. In addition, your symptoms may get worse before they get better, this period will typically not last longer than 24 hours.   . What Can I do After My Treatment? o Increase your hydration by drinking more water for the next 24 hours. o You may place ice or heat on the areas treated that have become sore, however, do not use heat on inflamed or bruised areas. Heat often brings more relief post needling. o You can continue your regular activities, but vigorous activity is not recommended initially after the treatment for 24 hours. o DN is best combined with other physical therapy such as strengthening, stretching, and other therapies.    Precautions:  In some cases, dry needling is done over the lung field. While  rare, there is a risk of pneumothorax (punctured lung). Because of this, if you ever experience shortness of breath on exertion, difficulty taking a deep breath, chest pain or a dry cough following dry needling, you should report to an emergency room and tell them that you have been dry needled over the thorax.  Madelyn Flavors, PT 10/19/17 9:43 AM [facility] [address] [place], [place], [address] Phone: [phone]   Fax:  [phone]

## 2017-10-19 NOTE — Therapy (Signed)
[facility] [place], [place], [address] Phone: [phone]   Fax:  [phone]  Physical Therapy Treatment  Patient Details  Name: Pamela Walters MRN: [id number] Date of Birth: 10-03-1960 Referring Provider: Ronette Deter PA-C   Encounter Date: 10/19/2017  PT End of Session - 10/19/17 0900    Visit Number  2    Number of Visits  12    Date for PT Re-Evaluation  11/25/17    Authorization Type  FOTO AT LEAST EVERY 5TH VISIT, 10TH VISIT PROGRESS NOTE.    PT Start Time  0900    PT Stop Time  0954    PT Time Calculation (min)  54 min    Activity Tolerance  Other (comment);Patient tolerated treatment well DN limited by spasms    Behavior During Therapy  [facility] for tasks assessed/performed       Past Medical History:  Diagnosis Date  . Allergy   . Back pain   . Bell's palsy 08/2017  . Depression   . GERD (gastroesophageal reflux disease)   . Hepatic steatosis   . Hiatal hernia   . Hyperlipidemia   . Hypertension   . Internal hemorrhoids   . MS (multiple sclerosis) ([place])    2007  . Multiple sclerosis ([place])   . Schatzki's ring   . Sleep apnea    wears CPAP    Past Surgical History:  Procedure Laterality Date  . ABDOMINAL HYSTERECTOMY  2005   fibroids  . CARDIAC CATHETERIZATION N/A 05/04/2016   Procedure: Left Heart Cath and Coronary Angiography;  Surgeon: Jettie Booze, MD;  Location: [facility];  Service: Cardiovascular;  Laterality: N/A;  . ESOPHAGOGASTRODUODENOSCOPY N/A 09/18/2013   Procedure: ESOPHAGOGASTRODUODENOSCOPY (EGD);  Surgeon: Jerene Bears, MD;  Location: [facility];  Service: Gastroenterology;  Laterality: N/A;  . UPPER GASTROINTESTINAL ENDOSCOPY    . WISDOM TOOTH EXTRACTION      There were no vitals filed for this visit.  Subjective Assessment - 10/19/17 0901    Subjective  Patient just finished 12 hour shift and her back is "killing me"    Pertinent History  MS.    Limitations   Sitting;Standing    How long can you sit comfortably?  10 minutes.    How long can you stand comfortably?  10 minutes.    Diagnostic tests  MRI:  Cogential T11-12 fusion; neural foraminal narrowing; Grade 1 anterolisthesis L4 on L5 and severe bilateral L4-5 and severe right L5-S1 facet arthrosis.    Patient Stated Goals  Decrease my pai and get around better.    Currently in Pain?  Yes    Pain Score  6     Pain Location  Back    Pain Orientation  Right;Left;Mid    Pain Descriptors / Indicators  Aching;Spasm    Pain Type  Chronic pain                       [facility] Adult PT Treatment/Exercise - 10/19/17 0001      Exercises   Exercises  Lumbar      Lumbar Exercises: Prone   Other Prone Lumbar Exercises  prone lying painful; prone over folded pillow decreases pain; 2 pillows       Modalities   Modalities  Electrical Stimulation;Moist Heat      Moist Heat Therapy   Number Minutes Moist Heat  15 Minutes    Moist Heat Location  Lumbar Spine  Pension scheme manager Action  IFC 80-150 Hz x 15 min    Electrical Stimulation Goals  Pain;Tone      Manual Therapy   Manual Therapy  Soft tissue mobilization;Myofascial release    Soft tissue mobilization  to R lumbar and gluteals    Myofascial Release  TPR to R QL and paraspinals       Trigger Point Dry Needling - 10/19/17 0942    Consent Given?  Yes    Education Handout Provided  Yes    Muscles Treated Upper Body  Longissimus;Quadratus Lumborum Right    Muscles Treated Lower Body  Gluteus minimus    Longissimus Response  Twitch response elicited;Palpable increased muscle length    Gluteus Minimus Response  Twitch response elicited;Palpable increased muscle length glut med           PT Education - 10/19/17 1207    Education Details  DN education and aftercare and HEP    Person(s) Educated  Patient    Methods  Explanation;Demonstration;Handout     Comprehension  Verbalized understanding          PT Long Term Goals - 10/14/17 1514      PT LONG TERM GOAL #1   Title  Ind with HEP.    Time  6    Period  Weeks    Status  New      PT LONG TERM GOAL #2   Title  Sit 30 minutes wiht pain not > 3/10.    Time  6    Period  Weeks    Status  New      PT LONG TERM GOAL #3   Title  Stand 20 minutes with pain not > 3/10.    Time  6    Period  Weeks    Status  New      PT LONG TERM GOAL #4   Title  Perform ADL's with pain not > 4/10.    Time  6    Period  Weeks    Status  New            Plan - 10/19/17 1204    Clinical Impression Statement  Patient presents today with significant spasms in low back R>L. She was able to decrease pain and spasm by 50% lying prone over 2 pillows. DN was performed after consent was given, but was difficult in the low back due to spasms. She may do better having estim prior to DN next time. Good response in gluteals. Difficult to assess R QL and longissimus due to ongoing spasms. She did report some relief at end of treatment.    PT Treatment/Interventions  ADLs/Self Care Home Management;Cryotherapy;Electrical Stimulation;Ultrasound;Moist Heat;Therapeutic activities;Therapeutic exercise;Patient/family education;Manual techniques;Dry needling    PT Next Visit Plan  Assess DN; Draw-in and pain-free lumbar extension; Combo e'stim/U/S to lumbar musculature f/b STW/M and dry needling; HMP and e'stim.  Core exercise progression.    PT Home Exercise Plan  prone over 2 pillows x 5 min multiple times per day       Patient will benefit from skilled therapeutic intervention in order to improve the following deficits and impairments:  Decreased activity tolerance, Postural dysfunction, Pain  Visit Diagnosis: Chronic bilateral low back pain without sciatica     Problem List Patient Active Problem List   Diagnosis Date Noted  . Carpal tunnel syndrome, bilateral 05/19/2017  . Abnormal stress test   .  Allergic rhinitis 06/20/2015  . Dysesthesia 03/04/2015  . Obstructive sleep apnea 10/01/2014  . Increased homocysteine ([place]) 10/01/2014  . Urinary incontinence 05/30/2014  . Back pain with radiation 02/05/2013  . DISORDER OF BONE AND CARTILAGE UNSPECIFIED 12/16/2009  . Obesity 04/08/2008  . Multiple sclerosis ([place]) 04/04/2008  . Depression with anxiety 12/18/2007  . HEADACHE 12/18/2007  . Hyperlipidemia LDL goal <100 12/16/2007  . Essential hypertension 12/16/2007    Ankush Gintz PT 10/19/2017, 12:10 PM  [facility] [place], [place], [address] Phone: [phone]   Fax:  [phone]  Name: Pamela Walters MRN: [id number] Date of Birth: 07/15/1960

## 2017-10-20 LAB — URINE CULTURE
MICRO NUMBER:: 90753741
SPECIMEN QUALITY: ADEQUATE

## 2017-10-21 ENCOUNTER — Ambulatory Visit: Payer: BLUE CROSS/BLUE SHIELD | Admitting: Physical Therapy

## 2017-10-21 ENCOUNTER — Encounter: Payer: Self-pay | Admitting: Physical Therapy

## 2017-10-21 DIAGNOSIS — G8929 Other chronic pain: Secondary | ICD-10-CM | POA: Diagnosis not present

## 2017-10-21 DIAGNOSIS — M545 Low back pain: Secondary | ICD-10-CM | POA: Diagnosis not present

## 2017-10-21 NOTE — Therapy (Signed)
[facility] [place], [place], [address] Phone: [phone]   Fax:  [phone]  Physical Therapy Treatment  Patient Details  Name: Pamela Walters MRN: [id number] Date of Birth: 12-31-1960 Referring Provider: Ronette Deter PA-C   Encounter Date: 10/21/2017  PT End of Session - 10/21/17 1117    Visit Number  3    Number of Visits  12    Date for PT Re-Evaluation  11/25/17    Authorization Type  FOTO AT LEAST EVERY 5TH VISIT, 10TH VISIT PROGRESS NOTE.    PT Start Time  0900    PT Stop Time  0957    PT Time Calculation (min)  57 min       Past Medical History:  Diagnosis Date  . Allergy   . Back pain   . Bell's palsy 08/2017  . Depression   . GERD (gastroesophageal reflux disease)   . Hepatic steatosis   . Hiatal hernia   . Hyperlipidemia   . Hypertension   . Internal hemorrhoids   . MS (multiple sclerosis) ([place])    2007  . Multiple sclerosis ([place])   . Schatzki's ring   . Sleep apnea    wears CPAP    Past Surgical History:  Procedure Laterality Date  . ABDOMINAL HYSTERECTOMY  2005   fibroids  . CARDIAC CATHETERIZATION N/A 05/04/2016   Procedure: Left Heart Cath and Coronary Angiography;  Surgeon: Jettie Booze, MD;  Location: [facility];  Service: Cardiovascular;  Laterality: N/A;  . ESOPHAGOGASTRODUODENOSCOPY N/A 09/18/2013   Procedure: ESOPHAGOGASTRODUODENOSCOPY (EGD);  Surgeon: Jerene Bears, MD;  Location: [facility];  Service: Gastroenterology;  Laterality: N/A;  . UPPER GASTROINTESTINAL ENDOSCOPY    . WISDOM TOOTH EXTRACTION      There were no vitals filed for this visit.  Subjective Assessment - 10/21/17 0942    Subjective  Just got off my shift.  My pain is a 7/10.    Currently in Pain?  Yes    Pain Score  7     Pain Location  Back    Pain Orientation  Right;Left;Mid;Lower    Pain Descriptors / Indicators  Aching;Spasm    Pain Type  Chronic pain    Pain Onset  More than a month  ago         [facility] PT Assessment - 10/21/17 0001      Assessment   Next MD Visit  11/19/17                   [facility] Adult PT Treatment/Exercise - 10/21/17 0001      Modalities   Modalities  Electrical Stimulation;Moist Heat;Ultrasound      Moist Heat Therapy   Number Minutes Moist Heat  20 Minutes    Moist Heat Location  Lumbar Spine      Electrical Stimulation   Electrical Stimulation Location  Low back    Electrical Stimulation Action  IFC at 80-150 Hz x 20 minutes.    Electrical Stimulation Goals  Tone;Pain      Ultrasound   Ultrasound Location  Bilateral lumbar musculature.    Ultrasound Parameters  Combo e'stim/U/S at 1.50 W/CM2 x 12 minutes.    Ultrasound Goals  Pain      Manual Therapy   Manual Therapy  Soft tissue mobilization    Soft tissue mobilization  STW/M x 12 minutes to patient's bilateral lumbar erector spinae and QL's.  PT Long Term Goals - 10/14/17 1514      PT LONG TERM GOAL #1   Title  Ind with HEP.    Time  6    Period  Weeks    Status  New      PT LONG TERM GOAL #2   Title  Sit 30 minutes wiht pain not > 3/10.    Time  6    Period  Weeks    Status  New      PT LONG TERM GOAL #3   Title  Stand 20 minutes with pain not > 3/10.    Time  6    Period  Weeks    Status  New      PT LONG TERM GOAL #4   Title  Perform ADL's with pain not > 4/10.    Time  6    Period  Weeks    Status  New            Plan - 10/21/17 1115    Clinical Impression Statement  Patient had a gerat deal of tone in bilateral lumbar musculature.  She did well with STW/M and was taped (KT) following treatment with hopes of keeping muscle spasms at a minimum.    PT Treatment/Interventions  ADLs/Self Care Home Management;Cryotherapy;Electrical Stimulation;Ultrasound;Moist Heat;Therapeutic activities;Therapeutic exercise;Patient/family education;Manual techniques;Dry needling    PT Next Visit Plan  Assess DN; Draw-in and  pain-free lumbar extension; Combo e'stim/U/S to lumbar musculature f/b STW/M and dry needling; HMP and e'stim.  Core exercise progression.    PT Home Exercise Plan  prone over 2 pillows x 5 min multiple times per day    Consulted and Agree with Plan of Care  Patient       Patient will benefit from skilled therapeutic intervention in order to improve the following deficits and impairments:  Decreased activity tolerance, Postural dysfunction, Pain  Visit Diagnosis: Chronic bilateral low back pain without sciatica     Problem List Patient Active Problem List   Diagnosis Date Noted  . Carpal tunnel syndrome, bilateral 05/19/2017  . Abnormal stress test   . Allergic rhinitis 06/20/2015  . Dysesthesia 03/04/2015  . Obstructive sleep apnea 10/01/2014  . Increased homocysteine ([place]) 10/01/2014  . Urinary incontinence 05/30/2014  . Back pain with radiation 02/05/2013  . DISORDER OF BONE AND CARTILAGE UNSPECIFIED 12/16/2009  . Obesity 04/08/2008  . Multiple sclerosis ([place]) 04/04/2008  . Depression with anxiety 12/18/2007  . HEADACHE 12/18/2007  . Hyperlipidemia LDL goal <100 12/16/2007  . Essential hypertension 12/16/2007    Pamela Walters, [place] MPT 10/21/2017, 11:19 AM  [facility] [address] [place], [place], [address] Phone: [phone]   Fax:  [phone]  Name: Pamela Walters MRN: [id number] Date of Birth: Jun 22, 1960

## 2017-10-22 ENCOUNTER — Other Ambulatory Visit: Payer: Self-pay

## 2017-10-22 ENCOUNTER — Other Ambulatory Visit: Payer: Self-pay | Admitting: Family Medicine

## 2017-10-22 ENCOUNTER — Encounter: Payer: Self-pay | Admitting: Family Medicine

## 2017-10-22 MED ORDER — NITROFURANTOIN MONOHYD MACRO 100 MG PO CAPS: 100.0000 mg | ORAL_CAPSULE | Freq: Two times a day (BID) | ORAL | 0 refills | Status: DC

## 2017-10-22 MED ORDER — CIPROFLOXACIN HCL 500 MG PO TABS: ORAL_TABLET | ORAL | 0 refills | Status: DC

## 2017-10-22 NOTE — Progress Notes (Unsigned)
Nitrofurantoin

## 2017-10-25 ENCOUNTER — Ambulatory Visit: Payer: BLUE CROSS/BLUE SHIELD | Attending: Physician Assistant | Admitting: Physical Therapy

## 2017-10-25 ENCOUNTER — Encounter: Payer: Self-pay | Admitting: Physical Therapy

## 2017-10-25 DIAGNOSIS — G8929 Other chronic pain: Secondary | ICD-10-CM | POA: Insufficient documentation

## 2017-10-25 DIAGNOSIS — M545 Low back pain: Secondary | ICD-10-CM | POA: Diagnosis not present

## 2017-10-25 NOTE — Therapy (Signed)
[facility] [place], [place], [address] Phone: [phone]   Fax:  [phone]  Physical Therapy Treatment  Patient Details  Name: Pamela Walters MRN: [id number] Date of Birth: [birth year] Referring Provider: Ronette Deter PA-C   Encounter Date: 10/25/2017  PT End of Session - 10/25/17 0900    Visit Number  4    Number of Visits  12    Date for PT Re-Evaluation  11/25/17    Authorization Type  FOTO AT LEAST EVERY 5TH VISIT, 10TH VISIT PROGRESS NOTE.    PT Start Time  0821    PT Stop Time  0909    PT Time Calculation (min)  48 min    Activity Tolerance  Patient tolerated treatment well    Behavior During Therapy  [facility] for tasks assessed/performed       Past Medical History:  Diagnosis Date  . Allergy   . Back pain   . Bell's palsy 08/2017  . Depression   . GERD (gastroesophageal reflux disease)   . Hepatic steatosis   . Hiatal hernia   . Hyperlipidemia   . Hypertension   . Internal hemorrhoids   . MS (multiple sclerosis) ([place])    2007  . Multiple sclerosis ([place])   . Schatzki's ring   . Sleep apnea    wears CPAP    Past Surgical History:  Procedure Laterality Date  . ABDOMINAL HYSTERECTOMY  2005   fibroids  . CARDIAC CATHETERIZATION N/A 05/04/2016   Procedure: Left Heart Cath and Coronary Angiography;  Surgeon: Jettie Booze, MD;  Location: [facility];  Service: Cardiovascular;  Laterality: N/A;  . ESOPHAGOGASTRODUODENOSCOPY N/A 09/18/2013   Procedure: ESOPHAGOGASTRODUODENOSCOPY (EGD);  Surgeon: Jerene Bears, MD;  Location: [facility];  Service: Gastroenterology;  Laterality: N/A;  . UPPER GASTROINTESTINAL ENDOSCOPY    . WISDOM TOOTH EXTRACTION      There were no vitals filed for this visit.  Subjective Assessment - 10/25/17 0826    Subjective  Patient arrived with ongoing discomfort in back, some relief after getting a massage over weekend    Pertinent History  MS.    Limitations   Sitting;Standing    How long can you sit comfortably?  10 minutes.    How long can you stand comfortably?  10 minutes.    Diagnostic tests  MRI:  Cogential T11-12 fusion; neural foraminal narrowing; Grade 1 anterolisthesis L4 on L5 and severe bilateral L4-5 and severe right L5-S1 facet arthrosis.    Patient Stated Goals  Decrease my pai and get around better.    Currently in Pain?  Yes    Pain Score  6     Pain Location  Back    Pain Orientation  Right;Left;Lower    Pain Descriptors / Indicators  Aching;Spasm    Pain Type  Chronic pain    Pain Onset  More than a month ago    Pain Frequency  Constant    Aggravating Factors   work    Pain Relieving Factors  rest                       [facility] Adult PT Treatment/Exercise - 10/25/17 0001      Moist Heat Therapy   Number Minutes Moist Heat  15 Minutes    Moist Heat Location  Lumbar Spine      Electrical Stimulation   Electrical Stimulation Location  Low back    Electrical Stimulation Action  IFC 80-150hz  x47min    Electrical Stimulation Goals  Tone;Pain      Ultrasound   Ultrasound Location  bil low back paraspinals    Ultrasound Parameters  [place] 1.5w/cm2/100%/26mhz x56min    Ultrasound Goals  Pain      Manual Therapy   Manual Therapy  Myofascial release;Soft tissue mobilization    Soft tissue mobilization  manual STW bilateral lumbar erector spinae and QL's.    Myofascial Release  MFR to bil glut/piriformis                  PT Long Term Goals - 10/25/17 0901      PT LONG TERM GOAL #1   Title  Ind with HEP.    Time  6    Period  Weeks    Status  On-going      PT LONG TERM GOAL #2   Title  Sit 30 minutes wiht pain not > 3/10.    Time  6    Period  Weeks    Status  On-going      PT LONG TERM GOAL #3   Title  Stand 20 minutes with pain not > 3/10.    Time  6    Period  Weeks    Status  On-going      PT LONG TERM GOAL #4   Title  Perform ADL's with pain not > 4/10.    Time  6    Period  Weeks     Status  On-going            Plan - 10/25/17 0901    Clinical Impression Statement  Patient tolerated treatment well today. Patient arrived from work and has increased pain. Patient was educated on posture awareness techniques. Patient has tight muscles in bil low back today and visual spasms and palpable pain. Goals ongoing at this time. Felt better after treatment today.     Rehab Potential  Good    PT Frequency  2x / week    PT Duration  6 weeks    PT Treatment/Interventions  ADLs/Self Care Home Management;Cryotherapy;Electrical Stimulation;Ultrasound;Moist Heat;Therapeutic activities;Therapeutic exercise;Patient/family education;Manual techniques;Dry needling    PT Next Visit Plan  cont with POC for Draw-in and pain-free lumbar extension; Combo e'stim/U/S to lumbar musculature f/b STW/M and dry needling; HMP and e'stim.  Core exercise progression.    Consulted and Agree with Plan of Care  Patient       Patient will benefit from skilled therapeutic intervention in order to improve the following deficits and impairments:  Decreased activity tolerance, Postural dysfunction, Pain  Visit Diagnosis: Chronic bilateral low back pain without sciatica     Problem List Patient Active Problem List   Diagnosis Date Noted  . Carpal tunnel syndrome, bilateral 05/19/2017  . Abnormal stress test   . Allergic rhinitis 06/20/2015  . Dysesthesia 03/04/2015  . Obstructive sleep apnea 10/01/2014  . Increased homocysteine ([place]) 10/01/2014  . Urinary incontinence 05/30/2014  . Back pain with radiation 02/05/2013  . DISORDER OF BONE AND CARTILAGE UNSPECIFIED 12/16/2009  . Obesity 04/08/2008  . Multiple sclerosis ([place]) 04/04/2008  . Depression with anxiety 12/18/2007  . HEADACHE 12/18/2007  . Hyperlipidemia LDL goal <100 12/16/2007  . Essential hypertension 12/16/2007    Phillips Climes, PTA 10/25/2017, 9:16 AM  [facility] [place], [place], [address] Phone: [phone]   Fax:  [phone]  Name: Pamela Walters MRN: [id number] Date of Birth: June 06, 1960

## 2017-10-27 ENCOUNTER — Ambulatory Visit (INDEPENDENT_AMBULATORY_CARE_PROVIDER_SITE_OTHER): Payer: BLUE CROSS/BLUE SHIELD | Admitting: Psychiatry

## 2017-10-27 ENCOUNTER — Encounter (HOSPITAL_COMMUNITY): Payer: Self-pay | Admitting: Psychiatry

## 2017-10-27 ENCOUNTER — Ambulatory Visit: Payer: BLUE CROSS/BLUE SHIELD | Admitting: Physical Therapy

## 2017-10-27 ENCOUNTER — Encounter: Payer: Self-pay | Admitting: Physical Therapy

## 2017-10-27 DIAGNOSIS — M545 Low back pain, unspecified: Secondary | ICD-10-CM

## 2017-10-27 DIAGNOSIS — G8929 Other chronic pain: Secondary | ICD-10-CM

## 2017-10-27 DIAGNOSIS — F331 Major depressive disorder, recurrent, moderate: Secondary | ICD-10-CM

## 2017-10-27 NOTE — Progress Notes (Signed)
                    THERAPIST PROGRESS NOTE          Session Time:    Wednesday 10/27/2017 9:11 AM -  10:03 AM                              Participation Level: Active  Behavioral Response: Casual/ alert, talkative,                 Type of Therapy: Individual Therapy        Treatment Goals:  1. Learn and implement coping and behavioral strategies to overcome depression.     2. Identify and replace thoughts and beliefs that support depression.         Treatment Goals addressed: 1,2  Interventions: Supportive,  CBT  Summary: Pamela Walters is a 57 y.o. female who is referred for services by PCP Dr. Moshe Cipro due to patient suffering symptoms of depression. Patient has been seen in the office interminttently for medication management and outpatient psychotherapy since 2016. She recently resumed services and was last seen 4 weeks ago. She reports increased depressed mood, poor motivation, anxiety, decreased interest in activities, diminished pleasure in activities, lack of appetite, excessive worry, and fatigue since last session. Patient also reports increased worry about having another panic attack as she had one about 2 months ago. She expresses frustration with self as she was not able to enjoy the holidays as she has in the past. 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 She also reports she has not   been practicing relaxation techniques consistently.        Patient last was seen 2 weeks ago. She reports continuing to  Feel better since last session. She has continued her job search efforts and has 2 interviews scheduled per her report. She also has begun physical therapy and has been doing exercises at home. She expresses some concern about going on family vacation next week as her son-in-law tends to provoke discord during family gatherings per patient's report. She reports trying not to worry and accept her daughter is the way she is. She expresses  sadness and hurt regarding daughter's behavior and choices. She also expresses sadness regarding the loss of the relationship she and her daughter once had.    Therapist Response:   Reviewed symptoms, praised and reinforce patient's completion of homework ( working with physical therapist), discussed relationship with daughter, facilitated expression of thoughts and feelings, validated feelings, developed goal in relationship with daughter ( improve communication with daughter), began to explore ways to do this including patient initiating contact with daughter, assisted patiend develop plan to text daughter once a week until next session,   Plan: Return again in 2 weeks.   Diagnosis: Axis I: Major Depressive Disorder, Recurrent, Moderate    Axis II: No diagnosis    Corrado Hymon, LCSW 10/27/2017

## 2017-10-27 NOTE — Therapy (Addendum)
[facility] [place], [place], [address] Phone: [phone]   Fax:  [phone]  Physical Therapy Treatment  Patient Details  Name: Pamela Walters MRN: [id number] Date of Birth: 11/22/1960 Referring Provider: Ronette Deter PA-C   Encounter Date: 10/27/2017  PT End of Session - 10/27/17 1242    Visit Number  5    Number of Visits  12    Date for PT Re-Evaluation  11/25/17    Authorization Type  FOTO AT LEAST EVERY 5TH VISIT, 10TH VISIT PROGRESS NOTE.    PT Start Time  1230    PT Stop Time  1313    PT Time Calculation (min)  43 min    Activity Tolerance  Patient tolerated treatment well    Behavior During Therapy  [facility] for tasks assessed/performed       Past Medical History:  Diagnosis Date  . Allergy   . Back pain   . Bell's palsy 08/2017  . Depression   . GERD (gastroesophageal reflux disease)   . Hepatic steatosis   . Hiatal hernia   . Hyperlipidemia   . Hypertension   . Internal hemorrhoids   . MS (multiple sclerosis) ([place])    2007  . Multiple sclerosis ([place])   . Schatzki's ring   . Sleep apnea    wears CPAP    Past Surgical History:  Procedure Laterality Date  . ABDOMINAL HYSTERECTOMY  2005   fibroids  . CARDIAC CATHETERIZATION N/A 05/04/2016   Procedure: Left Heart Cath and Coronary Angiography;  Surgeon: Jettie Booze, MD;  Location: [facility];  Service: Cardiovascular;  Laterality: N/A;  . ESOPHAGOGASTRODUODENOSCOPY N/A 09/18/2013   Procedure: ESOPHAGOGASTRODUODENOSCOPY (EGD);  Surgeon: Jerene Bears, MD;  Location: [facility];  Service: Gastroenterology;  Laterality: N/A;  . UPPER GASTROINTESTINAL ENDOSCOPY    . WISDOM TOOTH EXTRACTION      There were no vitals filed for this visit.  Subjective Assessment - 10/27/17 1235    Subjective  Patient arrived with ongoing discomfort in back, some relief after last treatment    Pertinent History  MS.    Limitations  Sitting;Standing    How long can you sit comfortably?  10 minutes.    How long can you stand comfortably?  10 minutes.    Diagnostic tests  MRI:  Cogential T11-12 fusion; neural foraminal narrowing; Grade 1 anterolisthesis L4 on L5 and severe bilateral L4-5 and severe right L5-S1 facet arthrosis.    Patient Stated Goals  Decrease my pai and get around better.    Currently in Pain?  Yes    Pain Score  6     Pain Location  Back    Pain Orientation  Right;Left;Lower    Pain Descriptors / Indicators  Aching;Spasm    Pain Type  Chronic pain    Pain Onset  More than a month ago    Pain Frequency  Constant    Aggravating Factors   work    Pain Relieving Factors  rest                       [facility] Adult PT Treatment/Exercise - 10/27/17 0001      Moist Heat Therapy   Number Minutes Moist Heat  15 Minutes    Moist Heat Location  Lumbar Spine      Electrical Stimulation   Electrical Stimulation Location  Low back    Electrical Stimulation Action  IFC 80-_0  x  74mn    Electrical Stimulation Goals  Tone;Pain      Ultrasound   Ultrasound Location  bil low back    Ultrasound Parameters  [place]1.5w/cm2/100%/172m x1212m   Ultrasound Goals  Pain      Manual Therapy   Manual Therapy  Myofascial release;Soft tissue mobilization    Soft tissue mobilization  manual STW bilateral mid and lumbar erector spinae and QL's.                  PT Long Term Goals - 10/25/17 0901      PT LONG TERM GOAL #1   Title  Ind with HEP.    Time  6    Period  Weeks    Status  On-going      PT LONG TERM GOAL #2   Title  Sit 30 minutes wiht pain not > 3/10.    Time  6    Period  Weeks    Status  On-going      PT LONG TERM GOAL #3   Title  Stand 20 minutes with pain not > 3/10.    Time  6    Period  Weeks    Status  On-going      PT LONG TERM GOAL #4   Title  Perform ADL's with pain not > 4/10.    Time  6    Period  Weeks    Status  On-going            Plan - 10/27/17 1308    Clinical  Impression Statement  Patient tolerated treatment well today. Patient has tightness in mid and low back today. Patient has visual spasms today. Patient feels improvement overall. Patient reported more pain after work and less with rest. Goals progressing. FOTO 55% limitation (initial 66%)      Clinical Presentation due to:  5th visit FOTO 55%    Rehab Potential  Good    PT Frequency  2x / week    PT Duration  6 weeks    PT Treatment/Interventions  ADLs/Self Care Home Management;Cryotherapy;Electrical Stimulation;Ultrasound;Moist Heat;Therapeutic activities;Therapeutic exercise;Patient/family education;Manual techniques;Dry needling    PT Next Visit Plan  cont with POC for Draw-in and pain-free lumbar extension; Combo e'stim/U/S to lumbar musculature f/b STW/M and dry needling; HMP and e'stim.  Core exercise progression.    Consulted and Agree with Plan of Care  Patient       Patient will benefit from skilled therapeutic intervention in order to improve the following deficits and impairments:  Decreased activity tolerance, Postural dysfunction, Pain  Visit Diagnosis: Chronic bilateral low back pain without sciatica     Problem List Patient Active Problem List   Diagnosis Date Noted  . Carpal tunnel syndrome, bilateral 05/19/2017  . Abnormal stress test   . Allergic rhinitis 06/20/2015  . Dysesthesia 03/04/2015  . Obstructive sleep apnea 10/01/2014  . Increased homocysteine ([place]/09/2014  . Urinary incontinence 05/30/2014  . Back pain with radiation 02/05/2013  . DISORDER OF BONE AND CARTILAGE UNSPECIFIED 12/16/2009  . Obesity 04/08/2008  . Multiple sclerosis ([place]/12/2007  . Depression with anxiety 12/18/2007  . HEADACHE 12/18/2007  . Hyperlipidemia LDL goal <100 12/16/2007  . Essential hypertension 12/16/2007    DUNFORD, CHRISTINA P, PTA 10/27/2017, 1:21 PM  [facility]one:  [phone]:  [phone]: Pamela MAGILLN: [id number] of Birth: 5/103/29/1962HYSICAL THERAPY  DISCHARGE SUMMARY  Visits from Start of Care: 5.  Current functional level related to goals / functional outcomes: See above.   Remaining deficits: See below.   Education / Equipment: HEP. Plan: Patient agrees to discharge.  Patient goals were not met. Patient is being discharged due to not returning since the last visit.  ?????         Mali Applegate MPT

## 2017-11-08 ENCOUNTER — Encounter: Payer: Self-pay | Admitting: Physical Therapy

## 2017-11-09 ENCOUNTER — Encounter: Payer: Self-pay | Admitting: Family Medicine

## 2017-11-09 ENCOUNTER — Ambulatory Visit (HOSPITAL_COMMUNITY): Payer: BLUE CROSS/BLUE SHIELD | Admitting: Psychiatry

## 2017-11-10 ENCOUNTER — Encounter (HOSPITAL_COMMUNITY): Payer: Self-pay | Admitting: *Deleted

## 2017-11-10 ENCOUNTER — Ambulatory Visit: Payer: BLUE CROSS/BLUE SHIELD | Admitting: Family Medicine

## 2017-11-10 ENCOUNTER — Emergency Department (HOSPITAL_COMMUNITY)
Admission: EM | Admit: 2017-11-10 | Discharge: 2017-11-10 | Disposition: A | Discharge status: Home / Self Care | Payer: BLUE CROSS/BLUE SHIELD | Attending: Emergency Medicine | Admitting: Emergency Medicine

## 2017-11-10 ENCOUNTER — Other Ambulatory Visit: Payer: Self-pay

## 2017-11-10 ENCOUNTER — Encounter: Payer: Self-pay | Admitting: Family Medicine

## 2017-11-10 ENCOUNTER — Ambulatory Visit (HOSPITAL_COMMUNITY): Payer: Self-pay | Admitting: Psychiatry

## 2017-11-10 VITALS — BP 110/70 | HR 81 | Temp 98.1°F | Resp 14 | Ht 63.0 in | Wt 173.0 lb

## 2017-11-10 DIAGNOSIS — R51 Headache: Secondary | ICD-10-CM | POA: Diagnosis not present

## 2017-11-10 DIAGNOSIS — R110 Nausea: Secondary | ICD-10-CM | POA: Insufficient documentation

## 2017-11-10 DIAGNOSIS — Z7982 Long term (current) use of aspirin: Secondary | ICD-10-CM | POA: Insufficient documentation

## 2017-11-10 DIAGNOSIS — E860 Dehydration: Secondary | ICD-10-CM | POA: Diagnosis not present

## 2017-11-10 DIAGNOSIS — R112 Nausea with vomiting, unspecified: Secondary | ICD-10-CM | POA: Diagnosis not present

## 2017-11-10 DIAGNOSIS — G4489 Other headache syndrome: Secondary | ICD-10-CM | POA: Diagnosis not present

## 2017-11-10 DIAGNOSIS — Z79899 Other long term (current) drug therapy: Secondary | ICD-10-CM | POA: Diagnosis not present

## 2017-11-10 DIAGNOSIS — R519 Headache, unspecified: Secondary | ICD-10-CM

## 2017-11-10 DIAGNOSIS — R638 Other symptoms and signs concerning food and fluid intake: Secondary | ICD-10-CM | POA: Insufficient documentation

## 2017-11-10 DIAGNOSIS — E876 Hypokalemia: Secondary | ICD-10-CM | POA: Diagnosis not present

## 2017-11-10 DIAGNOSIS — I10 Essential (primary) hypertension: Secondary | ICD-10-CM | POA: Diagnosis not present

## 2017-11-10 LAB — CBC WITH DIFFERENTIAL/PLATELET
BASOS PCT: 1 %
Basophils Absolute: 0 10*3/uL (ref 0.0–0.1)
EOS ABS: 0 10*3/uL (ref 0.0–0.7)
EOS PCT: 1 %
HCT: 36.1 % (ref 36.0–46.0)
HEMOGLOBIN: 12.1 g/dL (ref 12.0–15.0)
Lymphocytes Relative: 16 %
Lymphs Abs: 1 10*3/uL (ref 0.7–4.0)
MCH: 27.6 pg (ref 26.0–34.0)
MCHC: 33.5 g/dL (ref 30.0–36.0)
MCV: 82.4 fL (ref 78.0–100.0)
MONOS PCT: 10 %
Monocytes Absolute: 0.6 10*3/uL (ref 0.1–1.0)
NEUTROS PCT: 72 %
Neutro Abs: 4.5 10*3/uL (ref 1.7–7.7)
PLATELETS: 195 10*3/uL (ref 150–400)
RBC: 4.38 MIL/uL (ref 3.87–5.11)
RDW: 15.9 % — AB (ref 11.5–15.5)
WBC: 6.1 10*3/uL (ref 4.0–10.5)

## 2017-11-10 LAB — COMPREHENSIVE METABOLIC PANEL
ALBUMIN: 3.8 g/dL (ref 3.5–5.0)
ALT: 24 U/L (ref 0–44)
AST: 34 U/L (ref 15–41)
Alkaline Phosphatase: 58 U/L (ref 38–126)
Anion gap: 11 (ref 5–15)
BILIRUBIN TOTAL: 0.7 mg/dL (ref 0.3–1.2)
BUN: 16 mg/dL (ref 6–20)
CALCIUM: 9.1 mg/dL (ref 8.9–10.3)
CO2: 30 mmol/L (ref 22–32)
CREATININE: 0.76 mg/dL (ref 0.44–1.00)
Chloride: 98 mmol/L (ref 98–111)
GFR calc non Af Amer: >60 mL/min (ref >60)
GLUCOSE: 87 mg/dL (ref 70–99)
Potassium: 2.6 mmol/L — CL (ref 3.5–5.1)
Sodium: 139 mmol/L (ref 135–145)
TOTAL PROTEIN: 7.2 g/dL (ref 6.5–8.1)

## 2017-11-10 LAB — LIPASE, BLOOD: Lipase: 29 U/L (ref 11–51)

## 2017-11-10 MED ORDER — ONDANSETRON HCL 4 MG/2ML IJ SOLN
4.0000 mg | Freq: Once | INTRAMUSCULAR | Status: AC
  Administered 2017-11-10: 4 mg via INTRAMUSCULAR

## 2017-11-10 MED ORDER — SODIUM CHLORIDE 0.9 % IV BOLUS
1000.0000 mL | Freq: Once | INTRAVENOUS | Status: AC
  Administered 2017-11-10: 1000 mL via INTRAVENOUS

## 2017-11-10 MED ORDER — KETOROLAC TROMETHAMINE 30 MG/ML IJ SOLN
15.0000 mg | Freq: Once | INTRAMUSCULAR | Status: AC
  Administered 2017-11-10: 15 mg via INTRAVENOUS
  Filled 2017-11-10: qty 1

## 2017-11-10 MED ORDER — PROMETHAZINE HCL 25 MG PO TABS: 25.0000 mg | ORAL_TABLET | Freq: Three times a day (TID) | ORAL | 0 refills | Status: DC | PRN

## 2017-11-10 MED ORDER — PROMETHAZINE HCL 25 MG/ML IJ SOLN
12.5000 mg | Freq: Once | INTRAMUSCULAR | Status: AC
  Administered 2017-11-10: 12.5 mg via INTRAVENOUS
  Filled 2017-11-10: qty 1

## 2017-11-10 MED ORDER — POTASSIUM CHLORIDE CRYS ER 20 MEQ PO TBCR
20.0000 meq | EXTENDED_RELEASE_TABLET | Freq: Two times a day (BID) | ORAL | 0 refills | Status: DC
Start: 2017-11-10 — End: 2018-01-05

## 2017-11-10 MED ORDER — POTASSIUM CHLORIDE 10 MEQ/100ML IV SOLN
10.0000 meq | Freq: Once | INTRAVENOUS | Status: AC
  Administered 2017-11-10: 10 meq via INTRAVENOUS
  Filled 2017-11-10: qty 100

## 2017-11-10 NOTE — ED Triage Notes (Signed)
Left sided headache for 4 days with nausea

## 2017-11-10 NOTE — ED Notes (Signed)
CRITICAL VALUE ALERT  Critical Value:  K+2.6  Date & Time Notied:  11-10-17 1332  Provider Notified: Dr. Alvino Chapel  Orders Received/Actions taken: continue to monitor

## 2017-11-10 NOTE — ED Notes (Signed)
Pt given peanut butter, crackers and ice water

## 2017-11-10 NOTE — ED Provider Notes (Signed)
[facility] EMERGENCY DEPARTMENT Provider Note   CSN: [id number] Arrival date & time: 11/10/17  1114     History   Chief Complaint Chief Complaint  Patient presents with  . Headache    HPI Pamela Walters is a 57 y.o. female.  HPI Patient presents with nausea vomiting.  Had some diarrhea.  Has felt bad for around the last 4 days.  Temperatures up to 100.3.  States she feels dehydrated.  States she has had very concentrated urine.  States that slight abdominal pain.  Has a dull headache.  States it feels like previous migraines.  Although she has not had headaches recently.  No clear sick contacts.  Saw Dr. Moshe Cipro in the office today and sent in for further treatment.  No chest pain.  No photophobia.  The headache is frontal and worse on the left side. Past Medical History:  Diagnosis Date  . Allergy   . Back pain   . Bell's palsy 08/2017  . Depression   . GERD (gastroesophageal reflux disease)   . Hepatic steatosis   . Hiatal hernia   . Hyperlipidemia   . Hypertension   . Internal hemorrhoids   . MS (multiple sclerosis) ([place])    2007  . Multiple sclerosis ([place])   . Schatzki's ring   . Sleep apnea    wears CPAP    Patient Active Problem List   Diagnosis Date Noted  . Nausea 11/10/2017  . Dehydration 11/10/2017  . Carpal tunnel syndrome, bilateral 05/19/2017  . Abnormal stress test   . Allergic rhinitis 06/20/2015  . Dysesthesia 03/04/2015  . Obstructive sleep apnea 10/01/2014  . Increased homocysteine ([place]) 10/01/2014  . Urinary incontinence 05/30/2014  . Back pain with radiation 02/05/2013  . DISORDER OF BONE AND CARTILAGE UNSPECIFIED 12/16/2009  . Obesity 04/08/2008  . Multiple sclerosis ([place]) 04/04/2008  . Depression with anxiety 12/18/2007  . HEADACHE 12/18/2007  . Hyperlipidemia LDL goal <100 12/16/2007  . Essential hypertension 12/16/2007    Past Surgical History:  Procedure Laterality Date  . ABDOMINAL HYSTERECTOMY  2005   fibroids  . CARDIAC  CATHETERIZATION N/A 05/04/2016   Procedure: Left Heart Cath and Coronary Angiography;  Surgeon: Jettie Booze, MD;  Location: [facility];  Service: Cardiovascular;  Laterality: N/A;  . ESOPHAGOGASTRODUODENOSCOPY N/A 09/18/2013   Procedure: ESOPHAGOGASTRODUODENOSCOPY (EGD);  Surgeon: Jerene Bears, MD;  Location: [facility];  Service: Gastroenterology;  Laterality: N/A;  . UPPER GASTROINTESTINAL ENDOSCOPY    . WISDOM TOOTH EXTRACTION       OB History   None      Home Medications    Prior to Admission medications   Medication Sig Start Date End Date Taking? Authorizing Provider  amLODipine (NORVASC) 2.5 MG tablet Take 1 tablet (2.5 mg total) by mouth daily. 10/18/17  Yes Fayrene Helper, MD  Armodafinil 250 MG tablet Take 1 tablet (250 mg total) by mouth daily. 05/19/17  Yes Sater, Nanine Means, MD  aspirin 81 MG tablet Take 81 mg by mouth daily.   Yes [provider]  clonazePAM (KLONOPIN) 0.5 MG tablet Take 1 tablet (0.5 mg total) by mouth daily as needed for anxiety. 05/10/17  Yes Cloria Spring, MD  DULoxetine (CYMBALTA) 60 MG capsule Take 1 capsule (60 mg total) by mouth daily. 08/10/17 08/10/18 Yes Cloria Spring, MD  ezetimibe (ZETIA) 10 MG tablet Take 1 tablet (10 mg total) by mouth daily. 02/03/17  Yes Fayrene Helper, MD  gabapentin (NEURONTIN)  600 MG tablet TAKE 1 TABLET BY MOUTH 3  TIMES DAILY 09/01/17  Yes Sater, Nanine Means, MD  metaxalone (SKELAXIN) 800 MG tablet Take 800 mg by mouth 2 (two) times daily as needed for muscle spasms.  05/24/14  Yes [provider]  mometasone (NASONEX) 50 MCG/ACT nasal spray USE 2 SPRAYS NASALLY DAILY 04/23/17  Yes Fayrene Helper, MD  Multiple Vitamin (MULTIVITAMIN WITH MINERALS) TABS tablet Take 1 tablet by mouth daily.   Yes [provider]  oxybutynin (DITROPAN-XL) 10 MG 24 hr tablet TAKE 1 TABLET BY MOUTH AT  BEDTIME 10/11/17  Yes Sater, Nanine Means, MD  pantoprazole (PROTONIX) 40 MG tablet Take 1  tablet (40 mg total) by mouth every morning. 09/02/17  Yes Pyrtle, Lajuan Lines, MD  prazosin (MINIPRESS) 1 MG capsule Take 1 capsule (1 mg total) by mouth at bedtime. 08/10/17  Yes Cloria Spring, MD  ranitidine (ZANTAC) 150 MG tablet Take 1 tablet (150 mg total) by mouth at bedtime. 06/05/16  Yes Pyrtle, Lajuan Lines, MD  rosuvastatin (CRESTOR) 40 MG tablet TAKE 1 TABLET BY MOUTH ONCE DAILY 07/20/17  Yes Fayrene Helper, MD  traMADol Veatrice Bourbon) 50 MG tablet 1-2 pills as needed up to 4/day 05/19/17  Yes Sater, Nanine Means, MD  triamterene-hydrochlorothiazide (MAXZIDE-25) 37.5-25 MG tablet Take 1 tablet by mouth daily. 04/23/17 04/23/18 Yes Fayrene Helper, MD  potassium chloride SA (K-DUR,KLOR-CON) 20 MEQ tablet Take 1 tablet (20 mEq total) by mouth 2 (two) times daily. 11/10/17   Davonna Belling, MD  promethazine (PHENERGAN) 25 MG tablet Take 1 tablet (25 mg total) by mouth every 8 (eight) hours as needed for nausea. 11/10/17   Davonna Belling, MD    Family History Family History  Problem Relation Age of Onset  . Hypertension Mother   . Hyperlipidemia Mother   . Depression Mother   . Hypertension Father   . Colon cancer Neg Hx   . Esophageal cancer Neg Hx   . Rectal cancer Neg Hx   . Stomach cancer Neg Hx     Social History Social History   Tobacco Use  . Smoking status: Never Smoker  . Smokeless tobacco: Never Used  Substance Use Topics  . Alcohol use: No  . Drug use: No     Allergies   Patient has no known allergies.   Review of Systems Review of Systems  Constitutional: Positive for appetite change and fatigue.  HENT: Negative for congestion.   Eyes: Negative for visual disturbance.  Respiratory: Negative for shortness of breath.   Cardiovascular: Negative for chest pain.  Gastrointestinal: Positive for abdominal pain, nausea and vomiting.  Endocrine: Negative for polyuria.  Genitourinary: Negative for flank pain.  Musculoskeletal: Negative for back pain.  Neurological:  Positive for headaches.  Hematological: Negative for adenopathy.  Psychiatric/Behavioral: Negative for confusion.     Physical Exam Updated Vital Signs BP 131/66 (BP Location: Right Arm) Comment: Simultaneous filing. User may not have seen previous data.  Pulse (!) 102 Comment: Simultaneous filing. User may not have seen previous data.  Temp 98 F (36.7 C) (Oral)   Resp 18   Ht 5\' 3"  (1.6 m)   Wt 78.9 kg (174 lb)   SpO2 95% Comment: Simultaneous filing. User may not have seen previous data.  BMI 30.82 kg/m   Physical Exam  Constitutional: She is oriented to person, place, and time. She appears well-developed.  HENT:  Head: Atraumatic.  Eyes: EOM are normal.  Neck: Neck supple.  Cardiovascular:  Normal rate.  Pulmonary/Chest: Effort normal.  Abdominal: Soft. There is no tenderness.  Musculoskeletal: Normal range of motion.  Neurological: She is alert and oriented to person, place, and time.  Skin: Skin is warm. Capillary refill takes less than 2 seconds.     ED Treatments / Results  Labs (all labs ordered are listed, but only abnormal results are displayed) Labs Reviewed  COMPREHENSIVE METABOLIC PANEL - Abnormal; Notable for the following components:      Result Value   Potassium 2.6 (*)    All other components within normal limits  CBC WITH DIFFERENTIAL/PLATELET - Abnormal; Notable for the following components:   RDW 15.9 (*)    All other components within normal limits  LIPASE, BLOOD  URINALYSIS, ROUTINE W REFLEX MICROSCOPIC    EKG None  Radiology No results found.  Procedures Procedures (including critical care time)  Medications Ordered in ED Medications  sodium chloride 0.9 % bolus 1,000 mL (0 mLs Intravenous Stopped 11/10/17 1339)  promethazine (PHENERGAN) injection 12.5 mg (12.5 mg Intravenous Given 11/10/17 1227)  potassium chloride 10 mEq in 100 mL IVPB (0 mEq Intravenous Stopped 11/10/17 1457)  sodium chloride 0.9 % bolus 1,000 mL (1,000 mLs  Intravenous New Bag/Given 11/10/17 1557)  promethazine (PHENERGAN) injection 12.5 mg (12.5 mg Intravenous Given 11/10/17 1557)  ketorolac (TORADOL) 30 MG/ML injection 15 mg (15 mg Intravenous Given 11/10/17 1557)     Initial Impression / Assessment and Plan / ED Course  I have reviewed the triage vital signs and the nursing notes.  Pertinent labs & imaging results that were available during my care of the patient were reviewed by me and considered in my medical decision making (see chart for details).     Patient with headache.  Likely secondary dehydration secondary to nausea vomiting some diarrhea.  Lab work overall reassuring but does have a mild hypokalemia.  Have supplemented.  After 2 L of fluid patient feels better.  Has tolerated orals.  Has had Phenergan twice.  Discharge home with Phenergan and potassium.  Follow-up with PCP as needed.  Final Clinical Impressions(s) / ED Diagnoses   Final diagnoses:  Acute nonintractable headache, unspecified headache type  Non-intractable vomiting with nausea, unspecified vomiting type  Hypokalemia    ED Discharge Orders        Ordered    promethazine (PHENERGAN) 25 MG tablet  Every 8 hours PRN     11/10/17 1742    potassium chloride SA (K-DUR,KLOR-CON) 20 MEQ tablet  2 times daily     11/10/17 1742       Davonna Belling, MD 11/10/17 1757

## 2017-11-10 NOTE — Patient Instructions (Addendum)
You need to go to the emergency room for evaluation.  F/u in 1 week  I will speak to the Doctor now.  You are being given zofran 4mg  IM for nausea

## 2017-11-10 NOTE — Discharge Instructions (Signed)
Hold your Maxide while you are taking the potassium.

## 2017-11-10 NOTE — Progress Notes (Addendum)
   Pamela Walters     MRN: [id number]      DOB: [birth year]   HPI Pamela Walters is here sick since past 5 days acute onset of nausea, headache , poor appetite ,increasingly confused , drowsy , chills , n Very poor oral intake since Sunday Vomit on Sunday x 3, has been taking zofran about 3 times each day for nausea but mainly sleeping. Had loose stools on Sat last BM was this past Monday,  None since She knows she is dehydratyed , thinks she has had no more than 16 oz per day, feels weak and is sweating excessivly,temperature  up to 100.9 on Monday, alternating  motrin and tylenol , did work on Sunday and was sent home after 6 hrs. Also has been having headache like migraine on Sunday which is better , but still present and on the left side She was at the beach on vacation for 12 days prior to onset of illness and was well.   ROS See HPI   Denies sinus pressure, nasal congestion, ear pain or sore throat. Denies chest congestion, productive cough or wheezing. Denies chest pains, palpitations and leg swelling  Denies dysuria, frequency, hesitancy or incontinence. Denies joint pain, swelling and limitation in mobility. Denies depression, anxiety or insomnia. Denies skin break down or rash.   PE  BP 110/70 (BP Location: Left Arm, Patient Position: Sitting, Cuff Size: Normal)   Pulse 81   Temp 98.1 F (36.7 C) (Oral)   Ht 5\' 3"  (1.6 m)   Wt 173 lb 0.6 oz (78.5 kg)   SpO2 97%   BMI 30.65 kg/m   Patient drowsy and ill appearing, barely able to give a coherent history Sweating profusely and clammy  HEENT: No facial asymmetry, EOMI,     Neck supple no JVD, no mass.Mucosa dry  Chest: Clear to auscultation bilaterally.  CVS: S1, S2 no murmurs, no S3.Regular rate.  ABD: Soft non tender.   Ext: No edema  MS: Adequate ROM spine, shoulders, hips and knees.  Skin: Intact, no ulcerations or rash noted.  CNS: CN 2-12 intact, power,  normal throughout.no focal deficits  noted.   Assessment & Plan  Nausea Severe nausea and recent h/o vomiting , fever , headache with poor oral intake and clinically dehydrated and con fused with reduced level of alertnmess Needs ED evaluation and management Zofran 4 mg iM   Dehydration symptoms Clinically dehydrated with history of poor oral intake and confusion/ reduced level of consciousness, pt needs ED evaluation and management, I have spoken directly to receiving MD. She is stable enough to be transported in private car by her husband who brought her to the visit  Headache Uncontrolled and disabling migraine like headache x 3 days, no new neurologic deficit with headache , but not resolving, concerned that MS symptoms are uncontrolled

## 2017-11-11 ENCOUNTER — Encounter: Payer: Self-pay | Admitting: Physical Therapy

## 2017-11-16 ENCOUNTER — Ambulatory Visit: Payer: Self-pay | Admitting: Neurology

## 2017-11-16 ENCOUNTER — Encounter: Payer: Self-pay | Admitting: Physical Therapy

## 2017-11-18 ENCOUNTER — Encounter: Payer: Self-pay | Admitting: Family Medicine

## 2017-11-18 ENCOUNTER — Ambulatory Visit: Payer: BLUE CROSS/BLUE SHIELD | Admitting: Family Medicine

## 2017-11-18 ENCOUNTER — Other Ambulatory Visit: Payer: Self-pay

## 2017-11-18 ENCOUNTER — Encounter: Payer: Self-pay | Admitting: Physical Therapy

## 2017-11-18 VITALS — BP 122/84 | HR 94 | Ht 63.0 in | Wt 171.0 lb

## 2017-11-18 DIAGNOSIS — I10 Essential (primary) hypertension: Secondary | ICD-10-CM

## 2017-11-18 DIAGNOSIS — G4489 Other headache syndrome: Secondary | ICD-10-CM

## 2017-11-18 DIAGNOSIS — R197 Diarrhea, unspecified: Secondary | ICD-10-CM | POA: Diagnosis not present

## 2017-11-18 DIAGNOSIS — Z09 Encounter for follow-up examination after completed treatment for conditions other than malignant neoplasm: Secondary | ICD-10-CM | POA: Diagnosis not present

## 2017-11-18 DIAGNOSIS — G35 Multiple sclerosis: Secondary | ICD-10-CM | POA: Diagnosis not present

## 2017-11-18 DIAGNOSIS — N390 Urinary tract infection, site not specified: Secondary | ICD-10-CM

## 2017-11-18 NOTE — Patient Instructions (Addendum)
F/U in Septemebr , cancel September, call if you need me sooner  Lab today chem 7 and EGFR  Blood pressure is good  Letter for work out from 7/14 to return 11/22/2017  You are being referred to [place] [facility] [place] office re recurrent UTI [facility])  You are being referred to Neurology re recentr Bells followed by severe headache sndrome for sooner follow up    You will receive specimen container and requisition  [facility]for [facility] for stool for C diff pls return asap / too lab

## 2017-11-18 NOTE — Progress Notes (Signed)
Pamela Walters     MRN: [id number]      DOB: Mar 14, 1961   HPI Ms. Puccinelli is here for follow up on ed visit last week Wednesday. States toradol given in the eD did relieve her headache, she was also nausea free. The following day she felt well enough to go out for a drive.  By lunchtime on Friday she started feeling ill again, nausea started again and then started having lower abdominal griping pain, and experienced chills and low grade temp to 100.5, loose stool started early Sat morning around 3 am till Sunday night 11 pm, no blood or mucus, over 15 BM and foul odour , had recently completed antibiotic for UTI ROS  Denies sinus pressure, nasal congestion, ear pain or sore throat. Denies chest congestion, productive cough or wheezing. Denies chest pains, palpitations and leg swelling  Denies dysuria, frequency, hesitancy or incontinence. Denies uncontrolled  joint pain, swelling and limitation in mobility. Denies uncontrolled  depression, anxiety or insomnia. Denies skin break down or rash. Recently experienced different type of headache than usual, this was more severe and of longer duration , requiring treatment in the ED, also has been experiencing abnormal ringing , opting for sooner appt with neurology   PE  BP 122/84 (BP Location: Left Arm, Patient Position: Sitting, Cuff Size: Normal)   Pulse 94   Ht 5\' 3"  (1.6 m)   Wt 171 lb (77.6 kg)   SpO2 97%   BMI 30.29 kg/m   Patient alert and oriented and in no cardiopulmonary distress.Improved since last visit HEENT: No facial asymmetry, EOMI,   oropharynx pink and moist.  Neck supple no JVD, no mass.  Chest: Clear to auscultation bilaterally.  CVS: S1, S2 no murmurs, no S3.Regular rate.  ABD: Soft mild diffuse generalized abdominal tenderness with hyperactive BS.   Ext: No edema  MS: Adequate ROM spine, shoulders, hips and knees.  Skin: Intact, no ulcerations or rash noted.  Psych: Good eye contact, normal affect.  Memory intact not anxious or depressed appearing.  CNS: CN 2-12 intact, power,  normal throughout.no focal deficits noted.   Assessment & Plan  Essential hypertension Controlled, no change in medication DASH diet and commitment to daily physical activity for a minimum of 30 minutes discussed and encouraged, as a part of hypertension management. The importance of attaining a healthy weight is also discussed.  BP/Weight 11/18/2017 11/10/2017 11/10/2017 10/18/2017 05/26/2017 05/19/2017 19/07/1738  Systolic BP 814 481 856 314 970 263 785  Diastolic BP 84 96 70 80 62 69 82  Wt. (Lbs) 171 174 173.04 183 184.25 183.5 183.04  BMI 30.29 30.82 30.65 32.42 32.64 32.51 32.42  Some encounter information is confidential and restricted. Go to Review Flowsheets activity to see all data.       Multiple sclerosis ([place]) PO is concerned that she is having increased and concerning neurologic symptoms , is calling in to schedule sooner follow up with her neurologist than the 12 months originally planned  Recurrent UTI (urinary tract infection) Repeated established UTI in patient being treated for incontinence , refer to urology for evaluation  Headache Severe headache syndrome necessitating   treatment in the ED to abort the pain and associated nausea, neurology to evaluate  Diarrhea, unspecified Explosive foul smelling loose stool, following recent treatment with antibiotic for recurrent UTi, needs to be tested for C diff Work excuse from 7/14 to 11/22/2017 due to prolonged and complicated illness/ history  Encounter for examination following treatment at hospital  Reports resolution of headache and nausea , however since being at home, she has developed severe nausea in the past  Weekend with foul stool, unable to return to work for an additional 4 days , and will check stool for C diff as well as refer to both urology for repeat UTI and pt to see neurology sooner than initially scheduled re her MS

## 2017-11-19 ENCOUNTER — Encounter: Payer: Self-pay | Admitting: Family Medicine

## 2017-11-19 LAB — BASIC METABOLIC PANEL WITH GFR
BUN: 11 mg/dL (ref 7–25)
CO2: 32 mmol/L (ref 20–32)
CREATININE: 0.84 mg/dL (ref 0.50–1.05)
Calcium: 9.6 mg/dL (ref 8.6–10.4)
Chloride: 95 mmol/L — ABNORMAL LOW (ref 98–110)
GFR, Est African American: 88 mL/min/{1.73_m2} (ref >=60)
GFR, Est Non African American: 76 mL/min/{1.73_m2} (ref >=60)
GLUCOSE: 97 mg/dL (ref 65–99)
Potassium: 3.8 mmol/L (ref 3.5–5.3)
Sodium: 138 mmol/L (ref 135–146)

## 2017-11-22 ENCOUNTER — Encounter: Payer: Self-pay | Admitting: Family Medicine

## 2017-11-22 ENCOUNTER — Encounter: Payer: Self-pay | Admitting: Neurology

## 2017-11-24 ENCOUNTER — Ambulatory Visit (INDEPENDENT_AMBULATORY_CARE_PROVIDER_SITE_OTHER): Payer: BLUE CROSS/BLUE SHIELD | Admitting: Psychiatry

## 2017-11-24 DIAGNOSIS — F331 Major depressive disorder, recurrent, moderate: Secondary | ICD-10-CM

## 2017-11-24 NOTE — Progress Notes (Signed)
THERAPIST PROGRESS NOTE          Session Time:    Wednesday 11/24/2017 9:12 AM -  10:10 AM                        Participation Level: Active  Behavioral Response: Casual/ alert, talkative,                 Type of Therapy: Individual Therapy        Treatment Goals:  1. Learn and implement coping and behavioral strategies to overcome depression.     2. Identify and replace thoughts and beliefs that support depression.         Treatment Goals addressed: 1,2  Interventions: Supportive,  CBT  Summary: Pamela Walters is a 57 y.o. female who is referred for services by PCP Dr. Moshe Cipro due to patient suffering symptoms of depression. Patient has been seen in the office interminttently for medication management and outpatient psychotherapy since 2016. She recently resumed services and was last seen 4 weeks ago. She reports increased depressed mood, poor motivation, anxiety, decreased interest in activities, diminished pleasure in activities, lack of appetite, excessive worry, and fatigue since last session. Patient also reports increased worry about having another panic attack as she had one about 2 months ago. She expresses frustration with self as she was not able to enjoy the holidays as she has in the past. 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 She also reports she has not   been practicing relaxation techniques consistently.        Patient last was seen 4 weeks ago. She reports continuing to feel better since last session. She states she has felt the best physically and mentally than she she has in a long time. She has rested more and has taken time off work. She reports improved sleep pattern. She still is considering finding first shift job and is actively looking for work. She had two interviews but says the facilities weren't places where she really wants to work. She enjoyed recent trip with family at the  beach. She reports being able to be present in the moment. She also reports accepting her children and grandchildren are adults and don't need her to take care of them. She reports she has been initiating contact with daughter and says this has been positive. However, she expresses frustration the conversations remain superficial . Patient wants daughter to explain her past/present behavior and choices. Patient continues to express sadness regarding the loss of the relationship and her daughter once had.   Therapist Response:   Reviewed symptoms, praised and reinforced patient's efforts regarding time for self and improved self care, discussed the effects on mood/behavior/thoughts, praised and reinforced completion of homework ( text daughter), facilitated expression of thoughts and feelings regarding relationship with daughter, validated feelings, assisted patient identify unrealistic demands and replace with helpful thoughts/realistic expectation, also used cognitive defusion to cope with should/ought statements regarding daughter, developed plan with patient to schedule and complete one face to face activity with daughter by next session, assisted patient practice assertive communication regarding making request of daughter, discussed using mindfulness skills in maintaining present moment awareness and acceptance in meeting with daughter  Plan: Return again in  2 weeks.   Diagnosis: Axis I: Major Depressive Disorder, Recurrent, Moderate    Axis II: No diagnosis    Kemoni Ortega, LCSW 11/24/2017

## 2017-12-02 ENCOUNTER — Ambulatory Visit (HOSPITAL_COMMUNITY): Payer: Self-pay | Admitting: Psychiatry

## 2017-12-08 ENCOUNTER — Ambulatory Visit (INDEPENDENT_AMBULATORY_CARE_PROVIDER_SITE_OTHER): Payer: BLUE CROSS/BLUE SHIELD | Admitting: Psychiatry

## 2017-12-08 DIAGNOSIS — F331 Major depressive disorder, recurrent, moderate: Secondary | ICD-10-CM | POA: Diagnosis not present

## 2017-12-08 NOTE — Progress Notes (Signed)
THERAPIST PROGRESS NOTE          Session Time:    Wednesday 12/08/2017 9:15 AM - 10:10 AM                        Participation Level: Active  Behavioral Response: Casual/ alert, talkative,                 Type of Therapy: Individual Therapy        Treatment Goals:  1. Learn and implement coping and behavioral strategies to overcome depression.     2. Identify and replace thoughts and beliefs that support depression.                          Treatment Goals addressed: 1,2  Interventions: Supportive,  CBT  Summary: Pamela Walters is a 57 y.o. female who is referred for services by PCP Dr. Moshe Cipro due to patient suffering symptoms of depression. Patient has been seen in the office interminttently for medication management and outpatient psychotherapy since 2016. She recently resumed services and was last seen 4 weeks ago. She reports increased depressed mood, poor motivation, anxiety, decreased interest in activities, diminished pleasure in activities, lack of appetite, excessive worry, and fatigue since last session. Patient also reports increased worry about having another panic attack as she had one about 2 months ago. She expresses frustration with self as she was not able to enjoy the holidays as she has in the past. 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 She also reports she has not   been practicing relaxation techniques consistently.        Patient last was seen about 2  weeks ago. She reports continuing to feel better since last session. She has resumed work but has set limits regarding work schedule. She reduced her work hours to 32 housrs per week and does not work 2 consecutive days. She reports this has created more balance for patient and has given her time to rest. She reports positive self-care and sleeping patterns. She reports more realistic expectations and expresses increased acceptance of  daughter's choices and behaviors. She reports initiating contact with daughter and experiencing less anger but more compassion for daughter. She continues to work on improving relationship with daughter. She expresses acceptance that daughter may never explain past/present behavior.  Patient is very pleased with her progress but expresses anxiety about how she can maintain progress should stressful events occur.   Therapist Response:   Reviewed symptoms, praised and reinforced patient's efforts regarding time for self and improved self care, discussed the effects on mood/behavior/thoughts, praised and reinforced completion of homework ( initiating face to face contact with daughter), facilitated expression of thoughts and feelings regarding relationship with daughter, validated feelings, discussed connection between patient's losses regarding relationship/dreams for daughter and depression, assisted patient develop plan to do an activity with daughter (invite daughter to get pedicure)  by next session,  discussed mindfulness and the window of tolerance to regulate emotions, discussed benefits of mindfulness, discussed and practiced grounding techniques to use when outside the window of tolerance, discussed rationale for and exercises to practice mindfulness, assigned patient to practice mindfulness activity daily between sessions,   Plan:  Return again in 2 weeks.   Diagnosis: Axis I: Major Depressive Disorder, Recurrent, Moderate    Axis II: No diagnosis    Jonessa Triplett, LCSW 12/08/2017

## 2017-12-09 ENCOUNTER — Other Ambulatory Visit: Payer: Self-pay | Admitting: Neurology

## 2017-12-09 ENCOUNTER — Other Ambulatory Visit: Payer: Self-pay | Admitting: Family Medicine

## 2017-12-09 DIAGNOSIS — I10 Essential (primary) hypertension: Secondary | ICD-10-CM

## 2017-12-10 ENCOUNTER — Ambulatory Visit (INDEPENDENT_AMBULATORY_CARE_PROVIDER_SITE_OTHER): Payer: BLUE CROSS/BLUE SHIELD | Admitting: Psychiatry

## 2017-12-10 ENCOUNTER — Encounter (HOSPITAL_COMMUNITY): Payer: Self-pay | Admitting: Psychiatry

## 2017-12-10 VITALS — BP 129/76 | HR 75 | Ht 63.0 in | Wt 175.0 lb

## 2017-12-10 DIAGNOSIS — F331 Major depressive disorder, recurrent, moderate: Secondary | ICD-10-CM | POA: Diagnosis not present

## 2017-12-10 MED ORDER — PRAZOSIN HCL 1 MG PO CAPS: 1.0000 mg | ORAL_CAPSULE | Freq: Every day | ORAL | 2 refills | Status: DC

## 2017-12-10 MED ORDER — CLONAZEPAM 0.5 MG PO TABS: 0.5000 mg | ORAL_TABLET | Freq: Every day | ORAL | 2 refills | Status: DC | PRN

## 2017-12-10 MED ORDER — DULOXETINE HCL 60 MG PO CPEP: 60.0000 mg | ORAL_CAPSULE | Freq: Every day | ORAL | 2 refills | Status: DC

## 2017-12-10 NOTE — Progress Notes (Signed)
[facility] MD/PA/NP OP Progress Note  12/10/2017 8:45 AM Pamela Walters  MRN:  [id number]  Chief Complaint:  Chief Complaint    Depression; Anxiety; Follow-up     HPI: This patient is a 57 year old married white female who lives with her husband in [place]. 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  The patient returns for follow-up of treatment for depression and anxiety. She was last seen in August and was switched from Prozac to Cymbalta. She did not feel like the Prozac was helping that much anymore and she was having tonic pain. She states she had been taking the Cymbalta consistently because of her work schedule she kept forgetting. However back in November she was out with her family and had a severe panic attack and had to go to the hospital emergency room for workup. Nothing really showed up but this scared her. For the last couple of weeks she has been taking the Cymbalta every day and she is starting to feel better. She still has periods of feeling shaky and jittery even in her own home. She is generally sleeping well. Her neurologist is still following her MS and she is going to have repeat brain scan in the next month. She occasionally has symptoms like numbness in her hands and feet and gets a bit off balance when she is tired. She is still able to work   The patient returns after 4 months.  Overall she is doing okay and she thinks the medications are finally at the right place.  The prazosin is helping reduce her nightmares considerably.  She is working nights but is able to sleep well during the day.  She denies being depressed or significantly anxious.  She has had a rather difficult summer.  She developed Bell's palsy back in May which took about a month to resolve.  In July she had a bad bout of a GI virus and was very sick with nausea vomiting and diarrhea for several days.  She has had to use the clonazepam most days due to anxiety. Visit Diagnosis:     ICD-10-CM   1. Major depressive disorder, recurrent episode, moderate (HCC) F33.1     Past Psychiatric History: none  Past Medical History:  Past Medical History:  Diagnosis Date  . Allergy   . Back pain   . Bell's palsy 08/2017  . Depression   . GERD (gastroesophageal reflux disease)   . Hepatic steatosis   . Hiatal hernia   . Hyperlipidemia   . Hypertension   . Internal hemorrhoids   . MS (multiple sclerosis) ([place])    2007  . Multiple sclerosis ([place])   . Schatzki's ring   . Sleep apnea    wears CPAP    Past Surgical History:  Procedure Laterality Date  . ABDOMINAL HYSTERECTOMY  2005   fibroids  . CARDIAC CATHETERIZATION N/A 05/04/2016   Procedure: Left Heart Cath and Coronary Angiography;  Surgeon: Jettie Booze, MD;  Location: [facility];  Service: Cardiovascular;  Laterality: N/A;  . ESOPHAGOGASTRODUODENOSCOPY N/A 09/18/2013   Procedure: ESOPHAGOGASTRODUODENOSCOPY (EGD);  Surgeon: Jerene Bears, MD;  Location: [facility];  Service: Gastroenterology;  Laterality: N/A;  . UPPER GASTROINTESTINAL ENDOSCOPY    . WISDOM TOOTH EXTRACTION      Family Psychiatric History: None  Family History:  Family History  Problem Relation Age of Onset  . Hypertension Mother   . Hyperlipidemia Mother   . Depression Mother   .  Hypertension Father   . Colon cancer Neg Hx   . Esophageal cancer Neg Hx   . Rectal cancer Neg Hx   . Stomach cancer Neg Hx     Social History:  Social History   Socioeconomic History  . Marital status: Married    Spouse name: Not on file  . Number of children: Not on file  . Years of education: Not on file  . Highest education level: Not on file  Occupational History  . Not on file  Social Needs  . Financial resource strain: Not on file  . Food insecurity:    Worry: Not on file    Inability: Not on file  . Transportation needs:    Medical: Not on file    Non-medical: Not on file  Tobacco Use  . Smoking status: Never Smoker  .  Smokeless tobacco: Never Used  Substance and Sexual Activity  . Alcohol use: No  . Drug use: No  . Sexual activity: Yes    Birth control/protection: Surgical  Lifestyle  . Physical activity:    Days per week: Not on file    Minutes per session: Not on file  . Stress: Not on file  Relationships  . Social connections:    Talks on phone: Not on file    Gets together: Not on file    Attends religious service: Not on file    Active member of club or organization: Not on file    Attends meetings of clubs or organizations: Not on file    Relationship status: Not on file  Other Topics Concern  . Not on file  Social History Narrative  . Not on file    Allergies: No Known Allergies  Metabolic Disorder Labs: Lab Results  Component Value Date   HGBA1C 5.5 05/10/2017   MPG 111 05/10/2017   MPG 108 02/20/2016   No results found for: PROLACTIN Lab Results  Component Value Date   CHOL 104 10/13/2017   TRIG 123 10/13/2017   HDL 45 (L) 10/13/2017   CHOLHDL 2.3 10/13/2017   VLDL 34 (H) 06/30/2016   LDLCALC 39 10/13/2017   LDLCALC 71 05/10/2017   Lab Results  Component Value Date   TSH 2.36 05/10/2017   TSH 4.02 02/20/2016    Therapeutic Level Labs: No results found for: LITHIUM No results found for: VALPROATE No components found for:  CBMZ  Current Medications: Current Outpatient Medications  Medication Sig Dispense Refill  . Armodafinil 250 MG tablet TAKE 1 TABLET BY MOUTH  DAILY 30 tablet 5  . aspirin 81 MG tablet Take 81 mg by mouth daily.    . clonazePAM (KLONOPIN) 0.5 MG tablet Take 1 tablet (0.5 mg total) by mouth daily as needed for anxiety. 30 tablet 2  . DULoxetine (CYMBALTA) 60 MG capsule Take 1 capsule (60 mg total) by mouth daily. 30 capsule 2  . ezetimibe (ZETIA) 10 MG tablet Take 1 tablet (10 mg total) by mouth daily. 30 tablet 5  . gabapentin (NEURONTIN) 600 MG tablet TAKE 1 TABLET BY MOUTH 3  TIMES DAILY 270 tablet 3  . metaxalone (SKELAXIN) 800 MG tablet  Take 800 mg by mouth 2 (two) times daily as needed for muscle spasms.     . mometasone (NASONEX) 50 MCG/ACT nasal spray USE 2 SPRAYS NASALLY DAILY 51 g 1  . Multiple Vitamin (MULTIVITAMIN WITH MINERALS) TABS tablet Take 1 tablet by mouth daily.    Marland Kitchen oxybutynin (DITROPAN-XL) 10 MG 24 hr tablet TAKE  1 TABLET BY MOUTH AT  BEDTIME 90 tablet 3  . pantoprazole (PROTONIX) 40 MG tablet Take 1 tablet (40 mg total) by mouth every morning. 90 tablet 0  . potassium chloride SA (K-DUR,KLOR-CON) 20 MEQ tablet Take 1 tablet (20 mEq total) by mouth 2 (two) times daily. 8 tablet 0  . prazosin (MINIPRESS) 1 MG capsule Take 1 capsule (1 mg total) by mouth at bedtime. 30 capsule 2  . promethazine (PHENERGAN) 25 MG tablet Take 1 tablet (25 mg total) by mouth every 8 (eight) hours as needed for nausea. 8 tablet 0  . ranitidine (ZANTAC) 150 MG tablet Take 1 tablet (150 mg total) by mouth at bedtime. 90 tablet 0  . rosuvastatin (CRESTOR) 40 MG tablet TAKE 1 TABLET BY MOUTH ONCE DAILY 30 tablet 11  . traMADol (ULTRAM) 50 MG tablet TAKE 1 TO 2 TABLETS BY  MOUTH UP TO 4 TIMES DAILY  AS NEEDED , NO MORE THAN 90 TABLETS PER MONTH 90 tablet 0  . triamterene-hydrochlorothiazide (MAXZIDE-25) 37.5-25 MG tablet Take 1 tablet by mouth daily. 90 tablet 1   Current Facility-Administered Medications  Medication Dose Route Frequency Provider Last Rate Last Dose  . 0.9 %  sodium chloride infusion  500 mL Intravenous Continuous Pyrtle, Lajuan Lines, MD      . 0.9 %  sodium chloride infusion  500 mL Intravenous Continuous Pyrtle, Lajuan Lines, MD       Facility-Administered Medications Ordered in Other Visits  Medication Dose Route Frequency Provider Last Rate Last Dose  . gadopentetate dimeglumine (MAGNEVIST) injection 20 mL  20 mL Intravenous Once PRN Sater, Nanine Means, MD         Musculoskeletal: Strength & Muscle Tone: within normal limits Gait & Station: normal Patient leans: N/A  Psychiatric Specialty Exam: Review of Systems   Neurological: Positive for headaches.  Psychiatric/Behavioral: The patient is nervous/anxious.   All other systems reviewed and are negative.   Blood pressure 129/76, pulse 75, height 5\' 3"  (1.6 m), weight 175 lb (79.4 kg), SpO2 97 %.Body mass index is 31 kg/m.  General Appearance: Casual and Fairly Groomed  Eye Contact:  Good  Speech:  Clear and Coherent  Volume:  Normal  Mood:  Anxious  Affect:  Congruent  Thought Process:  Goal Directed  Orientation:  Full (Time, Place, and Person)  Thought Content: WDL   Suicidal Thoughts:  No  Homicidal Thoughts:  No  Memory:  Immediate;   Good Recent;   Good Remote;   Good  Judgement:  Good  Insight:  Good  Psychomotor Activity:  Normal  Concentration:  Concentration: Good and Attention Span: Good  Recall:  Good  Fund of Knowledge: Good  Language: Good  Akathisia:  No  Handed:  Right  AIMS (if indicated): not done  Assets:  Communication Skills Desire for Improvement Resilience Social Support Talents/Skills  ADL's:  Intact  Cognition: WNL  Sleep:  Good   Screenings: PHQ2-9     Office Visit from 11/10/2017 in [facility] Office Visit from 10/18/2017 in [facility] from 07/07/2017 in [facility] Office Visit from 07/02/2016 in [facility] Office Visit from 02/08/2014 in [facility]  PHQ-2 Total Score  2  2  4   (Pended)   0  6  PHQ-9 Total Score  10  8  19   (Pended)   -  20       Assessment and Plan: This patient is a 57 year old female with  a history of depression and anxiety.  She is had to deal with medical issues such as MS which is currently not bothering her.  She is doing well on her current regimen and is able to work and sleep during the day.  She will continue Cymbalta 60 mg daily for depression, clonazepam 0.5 mg daily as needed for anxiety and prazosin 1 mg at bedtime for nightmares.  She will return to see me in 3  months   [name], MD 12/10/2017, 8:45 AM

## 2017-12-17 DIAGNOSIS — M5136 Other intervertebral disc degeneration, lumbar region: Secondary | ICD-10-CM | POA: Diagnosis not present

## 2017-12-18 ENCOUNTER — Encounter: Payer: Self-pay | Admitting: Family Medicine

## 2017-12-18 DIAGNOSIS — Z09 Encounter for follow-up examination after completed treatment for conditions other than malignant neoplasm: Secondary | ICD-10-CM | POA: Insufficient documentation

## 2017-12-18 DIAGNOSIS — R197 Diarrhea, unspecified: Secondary | ICD-10-CM | POA: Insufficient documentation

## 2017-12-18 DIAGNOSIS — N390 Urinary tract infection, site not specified: Secondary | ICD-10-CM | POA: Insufficient documentation

## 2017-12-18 NOTE — Assessment & Plan Note (Signed)
Clinically dehydrated with history of poor oral intake and confusion/ reduced level of consciousness, pt needs ED evaluation and management, I have spoken directly to receiving MD. She is stable enough to be transported in private car by her husband who brought her to the visit

## 2017-12-18 NOTE — Assessment & Plan Note (Signed)
Severe headache syndrome necessitating   treatment in the ED to abort the pain and associated nausea, neurology to evaluate

## 2017-12-18 NOTE — Assessment & Plan Note (Signed)
Severe nausea and recent h/o vomiting , fever , headache with poor oral intake and clinically dehydrated and con fused with reduced level of alertnmess Needs ED evaluation and management Zofran 4 mg iM

## 2017-12-18 NOTE — Assessment & Plan Note (Signed)
Controlled, no change in medication DASH diet and commitment to daily physical activity for a minimum of 30 minutes discussed and encouraged, as a part of hypertension management. The importance of attaining a healthy weight is also discussed.  BP/Weight 11/18/2017 11/10/2017 11/10/2017 10/18/2017 05/26/2017 05/19/2017 01/0/9323  Systolic BP 557 322 025 427 062 376 283  Diastolic BP 84 96 70 80 62 69 82  Wt. (Lbs) 171 174 173.04 183 184.25 183.5 183.04  BMI 30.29 30.82 30.65 32.42 32.64 32.51 32.42  Some encounter information is confidential and restricted. Go to Review Flowsheets activity to see all data.

## 2017-12-18 NOTE — Assessment & Plan Note (Addendum)
PO is concerned that she is having increased and concerning neurologic symptoms , is calling in to schedule sooner follow up with her neurologist than the 12 months originally planned

## 2017-12-18 NOTE — Assessment & Plan Note (Signed)
Repeated established UTI in patient being treated for incontinence , refer to urology for evaluation

## 2017-12-18 NOTE — Assessment & Plan Note (Signed)
Explosive foul smelling loose stool, following recent treatment with antibiotic for recurrent UTi, needs to be tested for C diff Work excuse from 7/14 to 11/22/2017 due to prolonged and complicated illness/ history

## 2017-12-18 NOTE — Assessment & Plan Note (Signed)
Reports resolution of headache and nausea , however since being at home, she has developed severe nausea in the past  Weekend with foul stool, unable to return to work for an additional 4 days , and will check stool for C diff as well as refer to both urology for repeat UTI and pt to see neurology sooner than initially scheduled re her MS

## 2017-12-19 NOTE — Assessment & Plan Note (Addendum)
Uncontrolled and disabling migraine like headache x 3 days, no new neurologic deficit with headache , but not resolving, concerned that MS symptoms are uncontrolled

## 2017-12-22 ENCOUNTER — Ambulatory Visit (INDEPENDENT_AMBULATORY_CARE_PROVIDER_SITE_OTHER): Payer: BLUE CROSS/BLUE SHIELD | Admitting: Psychiatry

## 2017-12-22 ENCOUNTER — Encounter (HOSPITAL_COMMUNITY): Payer: Self-pay | Admitting: Psychiatry

## 2017-12-22 DIAGNOSIS — F331 Major depressive disorder, recurrent, moderate: Secondary | ICD-10-CM | POA: Diagnosis not present

## 2017-12-22 NOTE — Progress Notes (Signed)
THERAPIST PROGRESS NOTE          Session Time:    Wednesday 12/22/2017 9:12 AM - 10:05 AM                   Participation Level: Active  Behavioral Response: Casual/ alert, talkative,                 Type of Therapy: Individual Therapy        Treatment Goals:  1. Learn and implement coping and behavioral strategies to overcome depression.     2. Identify and replace thoughts and beliefs that support depression.                          Treatment Goals addressed: 1,2  Interventions: Supportive,  CBT  Summary: Pamela Walters is a 57 y.o. female who is referred for services by PCP Dr. Moshe Cipro due to patient suffering symptoms of depression. Patient has been seen in the office interminttently for medication management and outpatient psychotherapy since 2016. She recently resumed services and was last seen 4 weeks ago. She reports increased depressed mood, poor motivation, anxiety, decreased interest in activities, diminished pleasure in activities, lack of appetite, excessive worry, and fatigue since last session. Patient also reports increased worry about having another panic attack as she had one about 2 months ago. She expresses frustration with self as she was not able to enjoy the holidays as she has in the past. 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 She also reports she has not   been practicing relaxation techniques consistently.        Patient last was seen about 2  weeks ago. She reports increased fatigue and feeling a little down 2 or 3 times since last session. Apparent trigger seems to be her job. Per patient's work, she picked up another shift at work and reports working consecutive days. She reports being stressed at work but used information and skills discussed and practiced in last therapy session. She reports using grounding techniques when realizing she had gone outside window of tolerance.  She is  pleased with her efforts with her daughter who accepted patient's invitation to do a activity but has not yet been able to schedule due to daughter's job. Patient reports she has not engaged in mindfulness practice activity as she has been too tired.   Therapist Response:   Reviewed symptoms, praised and reinforced patient's use of grounding techniques at work, reviewed window of tolerance and discussed thoughts, feelings, and sensations patient experienced when outside window of tolerance, reviewed rationale for practicing mindfulness exercise daily, practiced mindfulness activity using breath awareness, assisted patient develop plan to practice mindfulness activity using breath awareness daily ( 15 minutes in the morning on days she works and 15 minutes just prior to bedtime on days she doesn't work), discussed any thoughts and processes that may inhibit implementation of plan, assisted patient identify replacement statements for thoughts of being too tired, discussed the pros and cons of patient working extra hours, identified ways to resume positive self-care and set limits regarding working extra hours/days  Plan: Return again in 2 weeks.   Diagnosis: Axis I: Major Depressive Disorder, Recurrent, Moderate    Axis  II: No diagnosis    Zoella Roberti, LCSW 12/22/2017

## 2018-01-05 ENCOUNTER — Ambulatory Visit (INDEPENDENT_AMBULATORY_CARE_PROVIDER_SITE_OTHER): Payer: BLUE CROSS/BLUE SHIELD | Admitting: Psychiatry

## 2018-01-05 ENCOUNTER — Encounter: Payer: Self-pay | Admitting: Family Medicine

## 2018-01-05 ENCOUNTER — Other Ambulatory Visit: Payer: Self-pay

## 2018-01-05 ENCOUNTER — Ambulatory Visit (INDEPENDENT_AMBULATORY_CARE_PROVIDER_SITE_OTHER): Payer: BLUE CROSS/BLUE SHIELD | Admitting: Family Medicine

## 2018-01-05 VITALS — BP 120/80 | HR 96 | Resp 16 | Ht 63.0 in | Wt 169.0 lb

## 2018-01-05 DIAGNOSIS — I10 Essential (primary) hypertension: Secondary | ICD-10-CM | POA: Diagnosis not present

## 2018-01-05 DIAGNOSIS — G35 Multiple sclerosis: Secondary | ICD-10-CM

## 2018-01-05 DIAGNOSIS — R519 Headache, unspecified: Secondary | ICD-10-CM

## 2018-01-05 DIAGNOSIS — F331 Major depressive disorder, recurrent, moderate: Secondary | ICD-10-CM | POA: Diagnosis not present

## 2018-01-05 DIAGNOSIS — R002 Palpitations: Secondary | ICD-10-CM

## 2018-01-05 DIAGNOSIS — R51 Headache: Secondary | ICD-10-CM

## 2018-01-05 DIAGNOSIS — R079 Chest pain, unspecified: Secondary | ICD-10-CM

## 2018-01-05 MED ORDER — AMLODIPINE BESYLATE 2.5 MG PO TABS: 2.5000 mg | ORAL_TABLET | Freq: Every day | ORAL | 3 refills | Status: DC

## 2018-01-05 NOTE — Progress Notes (Signed)
Pamela Walters     MRN: [id number]      DOB: 01/09/1961   HPI [name] is here  C/o this past Sunday she experienced headache all day , left  Sided which is generally the side she has had headaches on. States ever since she had Bell's Plsy in May of this year she just feels as if she has not been right and has not seen her neurologist yet , severe headaches are often associated with excessive  sweating episodes . States she took BP that day, this past Sunday,  and got BP of 200/108 and HR of 98, symptoms the same on Monday and on Tuesday worse yesterday with HR of 129, she took norvasc yesterday, which she does not normally take   and she also experienced chest pain . She has not worked this week,fel unable to work the nexr several days she is scheduled to work and just  "not doing well" Several weeks ago, she had presented with severe headache and neurologic complaints also, she has a dx of ms ansd is followed by Neurology, when I offered to arrange a closer follow up, she stated she would call and arrange her follow up. I have not heard from her since tht time, today she states her appt is in October and the ways she now feels tat is too long , and again she is requesting time away from work because of headache , and also now c/o chest pain and rapid heart rate and elevated blood pressure. She has also seen cardiology in the past year, but with this new complaint , I am referring her for re evaluation ROS Denies recent fever or chills.Feels ill Denies sinus pressure, nasal congestion, ear pain or sore throat. Denies chest congestion, productive cough or wheezing. Denies abdominal pain, nausea, vomiting,diarrhea or constipation.   Denies dysuria, frequency, hesitancy or incontinence. Denies joint pain, swelling and limitation in mobility.  Denies uncontrolled depression,  Denies skin break down or rash.   PE  BP 122/80   Pulse (!) 112   Resp 16   Ht 5\' 3"  (1.6 m)   Wt 169 lb (76.7 kg)    SpO2 97%   BMI 29.94 kg/m   Patient alert and oriented and in no cardiopulmonary distress. Anxious  HEENT: No facial asymmetry, EOMI,   oropharynx pink and moist.  Neck supple no JVD, no mass.  Chest: Clear to auscultation bilaterally.  CVS: S1, S2 no murmurs, no S3.Regular rate. EKG: NSR, rate 88, no ischemia , borderline  LVH,   ABD: Soft non tender.   Ext: No edema  MS: Adequate ROM spine, shoulders, hips and knees.  Skin: Intact, no ulcerations or rash noted.  Psych: Good eye contact, anxious. Memory intact not depressed appearing.  CNS: CN 2-12 intact, power,  normal throughout.no focal deficits noted.   Assessment & Plan Headache Recurrent disabling headaches accompanied by excessive sweating episodes following episode of Bell's palsy in 08/2017, most recent on 01/02/2018, pt has mS, needs evaluation by her neurologist asap,  Multiple absences from work and has ED and urgent care evaluation also  Multiple sclerosis ([place]) Followed by neurology however concern raised by patient since dx in 08/2017 of Bell's Palsy that her condition may be worsening , increased anxiety noted in the patient, and 2nd office visit with request for work excuse for 1 week or more ecause of  Not doing well"  Neurology eval will attemp to get sooner appt Work excuse x  1 week provided  Essential hypertension Reports markedly elevated blood pressure with palpitation and chest pain , has added medication which had been discontinued and bP in office is nomal at visit. EKG: NSR, no ischemia and borderline LVH Resume amlodipine and refer to cardiology for re evaluation Work excuse x 1 week

## 2018-01-05 NOTE — Patient Instructions (Addendum)
F/U in 2 months, call if you need me before  I will refer you urgently to neurology and also to se the Cardiologist,  Blood pressure, EKG and pulse are normal in office today  Please resume amlodipine 2.5 mg since you report taking it yesterday and I have sent this in  Work excuse from Monday of this week to return next week Tuesday,

## 2018-01-05 NOTE — Progress Notes (Addendum)
                                      THERAPIST PROGRESS NOTE          Session Time:    Wednesday 01/05/2018 9:05 AM -  10:00 AM                   Participation Level: Active  Behavioral Response: Casual/ alert, talkative,                 Type of Therapy: Individual Therapy        Treatment Goals:  1. Learn and implement coping and behavioral strategies to overcome depression.     2. Identify and replace thoughts and beliefs that support depression.                          Treatment Goals addressed: 1,2  Interventions: Supportive,  CBT  Summary: Pamela Walters is a 57 y.o. female who is referred for services by PCP Dr. Moshe Cipro due to patient suffering symptoms of depression. Patient has been seen in the office interminttently for medication management and outpatient psychotherapy since 2016. She recently resumed services and was last seen 4 weeks ago. She reports increased depressed mood, poor motivation, anxiety, decreased interest in activities, diminished pleasure in activities, lack of appetite, excessive worry, and fatigue since last session. Patient also reports increased worry about having another panic attack as she had one about 2 months ago. She expresses frustration with self as she was not able to enjoy the holidays as she has in the past. 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 She also reports she has not   been practicing relaxation techniques consistently.        Patient last was seen about 2  weeks ago. She reports increased stress due to two tragedies. Her granddaughter's friend died of a drug overdose of heroin. Patient's close friend died of breast cancer. Patient reports attending their funerals this past weekend. She expresses appropriate sadness. She reports she was practicing mindfulness using breath awareness through app on phone consistently until the deaths. She reports schedule has been disrupted and makes judgmental  statements about self regarding this. She reports mindfulness activity has been helpful and says she felt more relaxed and less tense when she did practice. She also reports finding a comfortable place in her home she has designated for meditation.     Therapist Response:  Discussed stressors, facilitated expression of thoughts and feelings, validated feelings, normalized sadness and response to grief, discussed self-compassion, praised and reinforced patient's efforts to practice mindfulness activity, discussed effects, discussed mindfulness and the brain, provided psychoeducation on how mindful awareness and focused attention create changes in the brain to help regulate emotions, discussed how the brain responds to stressful events and how mindfulness helps to strengthen neural pathways through daily meditation practice, assigned patient to practice mindful meditation 10 minutes per day.  Plan: Return again in 2 weeks.   Diagnosis: Axis I: Major Depressive Disorder, Recurrent, Moderate    Axis II: No diagnosis    BYNUM,PEGGY, LCSW 01/05/2018

## 2018-01-07 ENCOUNTER — Encounter: Payer: Self-pay | Admitting: Family Medicine

## 2018-01-07 NOTE — Assessment & Plan Note (Signed)
Recurrent disabling headaches accompanied by excessive sweating episodes following episode of Bell's palsy in 08/2017, most recent on 01/02/2018, pt has mS, needs evaluation by her neurologist asap,  Multiple absences from work and has ED and urgent care evaluation also

## 2018-01-07 NOTE — Assessment & Plan Note (Signed)
Followed by neurology however concern raised by patient since dx in 08/2017 of Bell's Palsy that her condition may be worsening , increased anxiety noted in the patient, and 2nd office visit with request for work excuse for 1 week or more ecause of  Not doing well"  Neurology eval will attemp to get sooner appt Work excuse x 1 week provided

## 2018-01-07 NOTE — Assessment & Plan Note (Signed)
Reports markedly elevated blood pressure with palpitation and chest pain , has added medication which had been discontinued and bP in office is nomal at visit. EKG: NSR, no ischemia and borderline LVH Resume amlodipine and refer to cardiology for re evaluation Work excuse x 1 week

## 2018-01-11 ENCOUNTER — Other Ambulatory Visit: Payer: Self-pay | Admitting: Family Medicine

## 2018-01-13 DIAGNOSIS — M5136 Other intervertebral disc degeneration, lumbar region: Secondary | ICD-10-CM | POA: Diagnosis not present

## 2018-01-17 ENCOUNTER — Ambulatory Visit (INDEPENDENT_AMBULATORY_CARE_PROVIDER_SITE_OTHER): Payer: BLUE CROSS/BLUE SHIELD | Admitting: Cardiology

## 2018-01-17 ENCOUNTER — Encounter: Payer: Self-pay | Admitting: Cardiology

## 2018-01-17 VITALS — BP 112/76 | HR 84 | Ht 63.0 in | Wt 177.8 lb

## 2018-01-17 DIAGNOSIS — R0789 Other chest pain: Secondary | ICD-10-CM | POA: Diagnosis not present

## 2018-01-17 DIAGNOSIS — R002 Palpitations: Secondary | ICD-10-CM | POA: Diagnosis not present

## 2018-01-17 DIAGNOSIS — I10 Essential (primary) hypertension: Secondary | ICD-10-CM | POA: Diagnosis not present

## 2018-01-17 NOTE — Patient Instructions (Signed)
Your physician recommends that you schedule a follow-up appointment in: AS NEEDED WITH DR BRANCH  Your physician recommends that you continue on your current medications as directed. Please refer to the Current Medication list given to you today.  Thank you for choosing [facility]!!    

## 2018-01-17 NOTE — Progress Notes (Signed)
Clinical Summary [name] is a 57 y.o.female seen today for follow up of the following medical problems .  1. Chest pain - on and off for 6 months. Pressure like pain midchest. Dull pain that is constant, that can flare at times. Can be worst with exertion. 5/10 in severity. Can get dizzy with episodes. Varies in duration.  - DOE which is new. Example at [facility] parks recently, noticed significant DOE while walking.  - no LE edema.  CAD risk factors: HL, HTN, father heart troubles in 31s.  03/2016 nuclear stress small inapical inferolateral defect, variable breat attenuation vs mild ischemia - Jan 2018 cath no significant CAD    2. HTN - resting bp's 130-140s/80-90s at home - she recently restarted norvasc 2.5mg  daily.    3. Headaches - reports several year history of migraine headaches - recent headaches different from prior, though left sided as well - recent elevated bp's and heart rates with headaches. 7-8/10 in severity  - major episode this month. Started with headache, fatigue. While out at lunch became very diaphoretic, + diaphoretic, + palpitations with some chest pain. Took excedirin migrain with some relief.  - she restarted norvasc.       Past Medical History:  Diagnosis Date  . Allergy   . Back pain   . Bell's palsy 08/2017  . Depression   . GERD (gastroesophageal reflux disease)   . Hepatic steatosis   . Hiatal hernia   . Hyperlipidemia   . Hypertension   . Internal hemorrhoids   . MS (multiple sclerosis) ([place])    2007  . Multiple sclerosis ([place])   . Schatzki's ring   . Sleep apnea    wears CPAP     No Known Allergies   Current Outpatient Medications  Medication Sig Dispense Refill  . amLODipine (NORVASC) 2.5 MG tablet Take 1 tablet (2.5 mg total) by mouth daily. 90 tablet 3  . Armodafinil 250 MG tablet TAKE 1 TABLET BY MOUTH  DAILY 30 tablet 5  . aspirin 81 MG tablet Take 81 mg by mouth daily.    . clonazePAM (KLONOPIN) 0.5 MG  tablet Take 1 tablet (0.5 mg total) by mouth daily as needed for anxiety. 30 tablet 2  . DULoxetine (CYMBALTA) 60 MG capsule Take 1 capsule (60 mg total) by mouth daily. 30 capsule 2  . ezetimibe (ZETIA) 10 MG tablet TAKE 1 TABLET BY MOUTH ONCE DAILY 30 tablet 5  . gabapentin (NEURONTIN) 600 MG tablet TAKE 1 TABLET BY MOUTH 3  TIMES DAILY 270 tablet 3  . metaxalone (SKELAXIN) 800 MG tablet Take 800 mg by mouth 2 (two) times daily as needed for muscle spasms.     . mometasone (NASONEX) 50 MCG/ACT nasal spray USE 2 SPRAYS NASALLY DAILY 51 g 1  . Multiple Vitamin (MULTIVITAMIN WITH MINERALS) TABS tablet Take 1 tablet by mouth daily.    Marland Kitchen oxybutynin (DITROPAN-XL) 10 MG 24 hr tablet TAKE 1 TABLET BY MOUTH AT  BEDTIME 90 tablet 3  . pantoprazole (PROTONIX) 40 MG tablet Take 1 tablet (40 mg total) by mouth every morning. 90 tablet 0  . prazosin (MINIPRESS) 1 MG capsule Take 1 capsule (1 mg total) by mouth at bedtime. 30 capsule 2  . promethazine (PHENERGAN) 25 MG tablet Take 1 tablet (25 mg total) by mouth every 8 (eight) hours as needed for nausea. 8 tablet 0  . ranitidine (ZANTAC) 150 MG tablet Take 1 tablet (150 mg total) by mouth at  bedtime. 90 tablet 0  . rosuvastatin (CRESTOR) 40 MG tablet TAKE 1 TABLET BY MOUTH ONCE DAILY 30 tablet 11  . traMADol (ULTRAM) 50 MG tablet TAKE 1 TO 2 TABLETS BY  MOUTH UP TO 4 TIMES DAILY  AS NEEDED , NO MORE THAN 90 TABLETS PER MONTH 90 tablet 0  . triamterene-hydrochlorothiazide (MAXZIDE-25) 37.5-25 MG tablet TAKE 1 TABLET BY MOUTH  DAILY 90 tablet 1   Current Facility-Administered Medications  Medication Dose Route Frequency Provider Last Rate Last Dose  . 0.9 %  sodium chloride infusion  500 mL Intravenous Continuous Pyrtle, Lajuan Lines, MD      . 0.9 %  sodium chloride infusion  500 mL Intravenous Continuous Pyrtle, Lajuan Lines, MD       Facility-Administered Medications Ordered in Other Visits  Medication Dose Route Frequency Provider Last Rate Last Dose  . gadopentetate  dimeglumine (MAGNEVIST) injection 20 mL  20 mL Intravenous Once PRN Sater, Nanine Means, MD         Past Surgical History:  Procedure Laterality Date  . ABDOMINAL HYSTERECTOMY  2005   fibroids  . CARDIAC CATHETERIZATION N/A 05/04/2016   Procedure: Left Heart Cath and Coronary Angiography;  Surgeon: Jettie Booze, MD;  Location: [facility];  Service: Cardiovascular;  Laterality: N/A;  . ESOPHAGOGASTRODUODENOSCOPY N/A 09/18/2013   Procedure: ESOPHAGOGASTRODUODENOSCOPY (EGD);  Surgeon: Jerene Bears, MD;  Location: [facility];  Service: Gastroenterology;  Laterality: N/A;  . UPPER GASTROINTESTINAL ENDOSCOPY    . WISDOM TOOTH EXTRACTION       No Known Allergies    Family History  Problem Relation Age of Onset  . Hypertension Mother   . Hyperlipidemia Mother   . Depression Mother   . Hypertension Father   . Colon cancer Neg Hx   . Esophageal cancer Neg Hx   . Rectal cancer Neg Hx   . Stomach cancer Neg Hx      Social History [name] reports that she has never smoked. She has never used smokeless tobacco. [name] reports that she does not drink alcohol.   Review of Systems CONSTITUTIONAL: No weight loss, fever, chills, weakness or fatigue.  HEENT: Eyes: No visual loss, blurred vision, double vision or yellow sclerae.No hearing loss, sneezing, congestion, runny nose or sore throat.  SKIN: No rash or itching.  CARDIOVASCULAR: pe rhpi RESPIRATORY: No shortness of breath, cough or sputum.  GASTROINTESTINAL: No anorexia, nausea, vomiting or diarrhea. No abdominal pain or blood.  GENITOURINARY: No burning on urination, no polyuria NEUROLOGICAL: per hpi MUSCULOSKELETAL: No muscle, back pain, joint pain or stiffness.  LYMPHATICS: No enlarged nodes. No history of splenectomy.  PSYCHIATRIC: No history of depression or anxiety.  ENDOCRINOLOGIC: No reports of sweating, cold or heat intolerance. No polyuria or polydipsia.  Marland Kitchen   Physical Examination Vitals:    01/17/18 1601  BP: 112/76  Pulse: 84   Vitals:   01/17/18 1601  Weight: 177 lb 12.8 oz (80.6 kg)  Height: 5\' 3"  (1.6 m)    Gen: resting comfortably, no acute distress HEENT: no scleral icterus, pupils equal round and reactive, no palptable cervical adenopathy,  CV: RRR, no m/r/g no jvd Resp: Clear to auscultation bilaterally GI: abdomen is soft, non-tender, non-distended, normal bowel sounds, no hepatosplenomegaly MSK: extremities are warm, no edema.  Skin: warm, no rash Neuro:  no focal deficits Psych: appropriate affect   Diagnostic Studies 03/2016 nuclear stress  No diagnostic ST segment changes to indicate ischemia.  Small, mild intensity, partially  reversible apical inferolateral defect suggesting either variable breast attenuation or small region of ischemia.  Nuclear stress EF: 67%.  This is a low risk study.   05/04/16 cath  The left ventricular systolic function is normal.  LV end diastolic pressure is normal.  The left ventricular ejection fraction is 55-65% by visual estimate.  There is no aortic valve stenosis.  No significant CAD.  Continue preventive therapy.    Assessment and Plan  1. Chest pain -negative workup for CAD including nuclear stress test and recent cath - recent symptoms not consistent with ischemic etiology, no repeat testing at this time   2. HTN/Palpitations - episodes of HTN and elevated heart rates only in the setting of severe headaches. She has prior history of headaches with upcoming neuro appt. I think her HTN and heart rates are response to severe pain, her resting bp's are at goal - f/u neuro recs regarding her headache management, continue current bp meds.    F/u 4 months      Arnoldo Lenis, M.D.

## 2018-01-19 ENCOUNTER — Ambulatory Visit (HOSPITAL_COMMUNITY): Payer: Self-pay | Admitting: Psychiatry

## 2018-02-01 ENCOUNTER — Other Ambulatory Visit: Payer: Self-pay | Admitting: Internal Medicine

## 2018-02-02 ENCOUNTER — Ambulatory Visit (HOSPITAL_COMMUNITY): Payer: Self-pay | Admitting: Psychiatry

## 2018-02-10 ENCOUNTER — Encounter: Payer: Self-pay | Admitting: Neurology

## 2018-02-11 ENCOUNTER — Ambulatory Visit: Payer: BLUE CROSS/BLUE SHIELD | Admitting: Neurology

## 2018-02-11 ENCOUNTER — Encounter: Payer: Self-pay | Admitting: Neurology

## 2018-02-11 VITALS — BP 142/87 | HR 89 | Ht 63.0 in | Wt 173.5 lb

## 2018-02-11 DIAGNOSIS — Z8669 Personal history of other diseases of the nervous system and sense organs: Secondary | ICD-10-CM

## 2018-02-11 DIAGNOSIS — M542 Cervicalgia: Secondary | ICD-10-CM | POA: Diagnosis not present

## 2018-02-11 DIAGNOSIS — I10 Essential (primary) hypertension: Secondary | ICD-10-CM | POA: Diagnosis not present

## 2018-02-11 DIAGNOSIS — G35 Multiple sclerosis: Secondary | ICD-10-CM

## 2018-02-11 DIAGNOSIS — G4733 Obstructive sleep apnea (adult) (pediatric): Secondary | ICD-10-CM | POA: Diagnosis not present

## 2018-02-11 DIAGNOSIS — G35D Multiple sclerosis, unspecified: Secondary | ICD-10-CM

## 2018-02-11 DIAGNOSIS — G4489 Other headache syndrome: Secondary | ICD-10-CM

## 2018-02-11 HISTORY — DX: Personal history of other diseases of the nervous system and sense organs: Z86.69

## 2018-02-11 MED ORDER — TRAMADOL HCL 50 MG PO TABS: ORAL_TABLET | ORAL | 1 refills | Status: DC

## 2018-02-11 MED ORDER — GABAPENTIN 600 MG PO TABS: 600.0000 mg | ORAL_TABLET | Freq: Three times a day (TID) | ORAL | 3 refills | Status: DC

## 2018-02-11 NOTE — Progress Notes (Signed)
[facility]  PATIENT: Pamela Walters DOB: 1960/12/06  REFERRING CLINICIAN: Tula Walters HISTORY FROM: Patient REASON FOR VISIT: Possible MS, fatigue, myalgias   HISTORICAL  CHIEF COMPLAINT:  Chief Complaint  Patient presents with  . Multiple Sclerosis    She is here for her yearly follow.   She has noticed an increase in weakness and fatigue.  . Bell's Palsy    She experienced right-sided Bell's Palsy in May 2019.  Her symptoms have completely resolved.  . OSA on CPAP    She estimates wearing her CPAP 6-7 hours each night.  Nuvigil is helpful during the day.  Marland Kitchen Headache    Reporting daily, left-sided mild headaches.  Excedrin Migraine sometimes works.  . Chronic Back Pain    She had an epidural steroid injection in September 2019.  Her pain has improved.    HISTORY OF PRESENT ILLNESS:  She is a 57 y.o. woman who has probable MS and obstructive sleep apnea  Update 02/11/2018: She has possible MS (was diagnosed in past and on DMTs for a while):   She has been off of DMTs since 2011.   MRI shows nonspecific foci.     She hasn't felt well with more fatigue and more headaches.     She gets migraines associated with N/V vertigo.    She gets one severe migraine a month.   She gets milder dull ache on the left side.     She had Bells palsy Sep 11, 2017.   She went to the ED and was told she had Bells palsy.   Valtrex and prednisone was started.  She started improving in 3 weeks and is now 100% back to baseline.     OSA   She has reduced compliance (download today shows 50% use; 40% use > 4 hrs).   She often falls asleep before the mask is own.   She takes 600 mg gabapentin and prazosin.   She feels more energetic when she wears it the whole night.   She uses a nasal mask and likes her mask and machine.     Update 05/19/2017: She denies any new ne.   urologic symptoms/exacerbations. Last MRI performed 03/27/2015 was unchanged compared to the MRI from  02/09/2013. She has mild difficulty with her balance sometimes veering to the left or right while she walks. She denies any significant weakness. She has noted hand numbness the past few months.   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    She wore wrist splints for possible CTS years ago but never had any NCV/EMG.    She will sometimes have tingling helped by gabapentin.. She has urinary frequency and urgency that is helped by oxybutynin. She denies any difficulty with her vision.  She notes some fatigue.  Usually her fatigue will be worse when the temperatures are elevated. She has severe obstructive sleep apnea (AHI was 37) controlled on CPAP +9 cm.   She uses nightly for > 4 hours almost every night.   However, since she does third shifts, she has variable sleep.   Nuvigil helps at least 8 hours.   She was having more depression a couple months ago and rare anxiety attacks.   She was prescribed clonazepam but has not taken recently.    From 09/02/2016: MS:   She has been off of any disease modifying therapy since 2011 or so.    Her last MRI 03/27/2015 was unchanged  compared to the MRI 02/09/2013.   She does not believe she has had any exacerbations.  Gait/strength/sensation:   She feels gait is the same -- she is mildly of balanced and veers some with walking.   Strength is good and she denies any significant spasticity.  She notes no numbness but has some painful tingling in the legs at times    Bladder:  She has bladder frequency and urgency helped by oxybutynin.   She tolerates it well.     Vision:   She feels vision is stable.      She wears glasses.     No h/o optic neuritis  OSA/sleep:  She has severe OSA with an AHI equal 37. She is on CPAP +9 cm. She has been using CPAP every night but not always for the entire night.      When she wears CPAP the whole night, she does feel less tired the next day.  She sometimes falls asleep outside of the bed and then doesn't use CPAP the  whole night.   She has also noted less nocturia when she uses CPAP.   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   She is sometimes sleepy during the day. time        Fatigue:   She notes fatigue many days even if not sleepy.   The fatigue is both physical and mental.   Fatigue is always worse when she is hot.    Fatigue has been a little better since starting CPAP.      Mood/cognitive:   She has had mood swings and some irritability but no crying spells.  She sees Dr. Sima Walters.   She is gaining weight on Abilify but current regimen is helping mood a lot.   She notes decreased focus and attention.    Pain:   She reports myalgias in the legs. She gets some benefit from Skelaxin and hydrocodone and gabapentin.     She tolerates these well.   She has seen Dr. Rolena Walters and Dr. Nelva Walters for Pain.  She also has LBP from facet hypertrophy and spondylolisthesis at L4L5.     ESI injections have helped in past.    LBP is better  MS History: In  2006 and 2007 she presented with headaches. Headaches were bilateral and were associated with visual aura. Pain was throbbing and she had associated photophobia and phonophobia.  [name] underwent an MRI of the brain and an MR angiogram. The angiogram was normal but the MRI of the brain showed multiple white matter foci in a nonspecific pattern. She reports that Dr. Doy Walters did a lumbar puncture and she was told that the results were abnormal, consistent with MS. She was placed on Betaseron but had difficulties tolerating it. She has not been on any DMT for her MS over the last 4 - 5 years.  She did not have any definite exacerbations.  Specifically, there were no episodes of numbness, weakness, clumsiness or visual change that came on over a short period of time.  A repeat MRI of the brain showed that the might of been some progression of the MRI changes. An MRI of the thoracic spine did not show any spinal lesions.    DATA :   I reviewed her MRI images from 12//2016.   She has multiple white matter lesions and the majority are round and either subcortical or in the deep white matter.  A couple smaller ones are in the periventricular white matter.  There is symmetric hyperintensity in the mid midbrain.   I reviewed recent labwork showing normal vasculitis labs but elevated homocysteine.     MRI of the thoracic spine at that time showed no spinal cord lesions. MRI of the lumbar spine shows degenerative changes at a worse at L4-L5. She has 4 mm of anterolisthesis at that level    REVIEW OF SYSTEMS:  Constitutional: No fevers, chills, sweats, or change in appetite.   She has fatigue Eyes: No double vision,.   She notes eye pain Ear, nose and throat: No hearing loss, ear pain, nasal congestion, sore throat Cardiovascular: No chest pain, palpitations Respiratory:  No shortness of breath at rest or with exertion.   No wheezes GastrointestinaI: No nausea, vomiting, diarrhea, abdominal pain, fecal incontinence.   She has a hiatal hernia.  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   Genitourinary:  reports frequency and has 2 x nocturia.  No nocturia. Musculoskeletal:  No neck pain.  She reports lower back pain and has L4L5 DJD Integumentary: No rash, pruritus, skin lesions Neurological: as above Psychiatric: No depression at this time.  No anxiety Endocrine: Her weight is stable.   She has had hot flashes recently Hematologic/Lymphatic:  No anemia, purpura, petechiae. Allergic/Immunologic: No itchy/runny eyes, nasal congestion, recent allergic reactions, rashes  ALLERGIES: No Known Allergies  HOME MEDICATIONS: Outpatient Medications Prior to Visit  Medication Sig Dispense Refill  . amLODipine (NORVASC) 2.5 MG tablet Take 1 tablet (2.5 mg total) by mouth daily. 90 tablet 3  . Armodafinil 250 MG tablet TAKE 1 TABLET BY MOUTH  DAILY 30 tablet 5  . aspirin 81 MG tablet Take 81 mg by mouth daily.    . clonazePAM (KLONOPIN) 0.5 MG tablet Take 1 tablet  (0.5 mg total) by mouth daily as needed for anxiety. 30 tablet 2  . DULoxetine (CYMBALTA) 60 MG capsule Take 1 capsule (60 mg total) by mouth daily. 30 capsule 2  . ezetimibe (ZETIA) 10 MG tablet TAKE 1 TABLET BY MOUTH ONCE DAILY 30 tablet 5  . metaxalone (SKELAXIN) 800 MG tablet Take 800 mg by mouth 2 (two) times daily as needed for muscle spasms.     . mometasone (NASONEX) 50 MCG/ACT nasal spray USE 2 SPRAYS NASALLY DAILY 51 g 1  . Multiple Vitamin (MULTIVITAMIN WITH MINERALS) TABS tablet Take 1 tablet by mouth daily.    Marland Kitchen oxybutynin (DITROPAN-XL) 10 MG 24 hr tablet TAKE 1 TABLET BY MOUTH AT  BEDTIME 90 tablet 3  . pantoprazole (PROTONIX) 40 MG tablet Take 1 tablet (40 mg total) by mouth every morning. 90 tablet 0  . prazosin (MINIPRESS) 1 MG capsule Take 1 capsule (1 mg total) by mouth at bedtime. 30 capsule 2  . promethazine (PHENERGAN) 25 MG tablet Take 1 tablet (25 mg total) by mouth every 8 (eight) hours as needed for nausea. 8 tablet 0  . ranitidine (ZANTAC) 150 MG tablet Take 1 tablet (150 mg total) by mouth at bedtime. 90 tablet 0  . rosuvastatin (CRESTOR) 40 MG tablet TAKE 1 TABLET BY MOUTH ONCE DAILY 30 tablet 11  . triamterene-hydrochlorothiazide (MAXZIDE-25) 37.5-25 MG tablet TAKE 1 TABLET BY MOUTH  DAILY 90 tablet 1  . gabapentin (NEURONTIN) 600 MG tablet TAKE 1 TABLET BY MOUTH 3  TIMES DAILY 270 tablet 3  . traMADol (ULTRAM) 50 MG tablet TAKE 1 TO 2 TABLETS BY  MOUTH UP TO 4 TIMES DAILY  AS NEEDED , NO MORE THAN 90 TABLETS PER  MONTH 90 tablet 0   Facility-Administered Medications Prior to Visit  Medication Dose Route Frequency Provider Last Rate Last Dose  . gadopentetate dimeglumine (MAGNEVIST) injection 20 mL  20 mL Intravenous Once PRN Asuna Peth A, MD      . 0.9 %  sodium chloride infusion  500 mL Intravenous Continuous Pyrtle, Lajuan Lines, MD      . 0.9 %  sodium chloride infusion  500 mL Intravenous Continuous Pyrtle, Lajuan Lines, MD        PAST MEDICAL HISTORY: Past Medical  History:  Diagnosis Date  . Allergy   . Back pain   . Bell's palsy 08/2017  . Depression   . GERD (gastroesophageal reflux disease)   . Hepatic steatosis   . Hiatal hernia   . Hyperlipidemia   . Hypertension   . Internal hemorrhoids   . MS (multiple sclerosis) ([place])    2007  . Multiple sclerosis ([place])   . Multiple sclerosis ([place]) 04/04/2008   Qualifier: Diagnosis of  By: Moshe Cipro MD, Joycelyn Schmid    . Schatzki's ring   . Sleep apnea    wears CPAP    PAST SURGICAL HISTORY: Past Surgical History:  Procedure Laterality Date  . ABDOMINAL HYSTERECTOMY  2005   fibroids  . CARDIAC CATHETERIZATION N/A 05/04/2016   Procedure: Left Heart Cath and Coronary Angiography;  Surgeon: Jettie Booze, MD;  Location: [facility];  Service: Cardiovascular;  Laterality: N/A;  . ESOPHAGOGASTRODUODENOSCOPY N/A 09/18/2013   Procedure: ESOPHAGOGASTRODUODENOSCOPY (EGD);  Surgeon: Jerene Bears, MD;  Location: [facility];  Service: Gastroenterology;  Laterality: N/A;  . UPPER GASTROINTESTINAL ENDOSCOPY    . WISDOM TOOTH EXTRACTION      FAMILY HISTORY: Family History  Problem Relation Age of Onset  . Hypertension Mother   . Hyperlipidemia Mother   . Depression Mother   . Hypertension Father   . Colon cancer Neg Hx   . Esophageal cancer Neg Hx   . Rectal cancer Neg Hx   . Stomach cancer Neg Hx     SOCIAL HISTORY:  Social History   Socioeconomic History  . Marital status: Married    Spouse name: Not on file  . Number of children: Not on file  . Years of education: Not on file  . Highest education level: Not on file  Occupational History  . Not on file  Social Needs  . Financial resource strain: Not on file  . Food insecurity:    Worry: Not on file    Inability: Not on file  . Transportation needs:    Medical: Not on file    Non-medical: Not on file  Tobacco Use  . Smoking status: Never Smoker  . Smokeless tobacco: Never Used  Substance and Sexual Activity  . Alcohol use:  No  . Drug use: No  . Sexual activity: Yes    Birth control/protection: Surgical  Lifestyle  . Physical activity:    Days per week: Not on file    Minutes per session: Not on file  . Stress: Not on file  Relationships  . Social connections:    Talks on phone: Not on file    Gets together: Not on file    Attends religious service: Not on file    Active member of club or organization: Not on file    Attends meetings of clubs or organizations: Not on file    Relationship status: Not on file  . Intimate partner violence:    Fear of  current or ex partner: Not on file    Emotionally abused: Not on file    Physically abused: Not on file    Forced sexual activity: Not on file  Other Topics Concern  . Not on file  Social History Narrative  . Not on file     PHYSICAL EXAM  Vitals:   02/11/18 0934  BP: (!) 142/87  Pulse: 89  Weight: 173 lb 8 oz (78.7 kg)  Height: 5\' 3"  (1.6 m)    Body mass index is 30.73 kg/m.   General: The patient is well-developed and well-nourished and in no acute distress.  Affect is normal.   Musculoskeletal:   She has tenderness over the left occiput at the splenius capitis muscle and occipital nerve  Neurologic Exam  Mental status: The patient is alert and oriented x 3 at the time of the examination. The patient has apparent normal recent and remote memory, with an apparently normal attention span and concentration ability.   Speech is normal.  Cranial nerves: Extraocular movements are full.  Facial strength and sensation is normal.  Trapezius strength is normal.  Palatal elevation and tongue protrusion are midline..  Trapezius and sternocleidomastoid strength is normal. No dysarthria is noted.     Motor:  Muscle bulk is normal. Tone was normal in the arms but slightly increased in the legs, a little more on the right. Strength is  5 / 5 in all 4 extremities.   Sensory: Sensory testing is intact to touch in all 4 extremities.  Coordination:  Cerebellar testing reveals good finger-nose-finger   Gait and station: The station is stable.. Gait is normal.  Tandem gait is wide.   Borderline Romberg.    Reflexes: Deep tendon reflexes are brisk in the legs withpout spread and normal in the arms bilaterally.     DIAGNOSTIC DATA (LABS, IMAGING, TESTING) - I reviewed patient records, labs, notes, testing and imaging myself where available.  Lab Results  Component Value Date   WBC 6.1 11/10/2017   HGB 12.1 11/10/2017   HCT 36.1 11/10/2017   MCV 82.4 11/10/2017   PLT 195 11/10/2017      Component Value Date/Time   NA 138 11/18/2017 1513   K 3.8 11/18/2017 1513   CL 95 (L) 11/18/2017 1513   CO2 32 11/18/2017 1513   GLUCOSE 97 11/18/2017 1513   BUN 11 11/18/2017 1513   CREATININE 0.84 11/18/2017 1513   CALCIUM 9.6 11/18/2017 1513   PROT 7.2 11/10/2017 1229   ALBUMIN 3.8 11/10/2017 1229   AST 34 11/10/2017 1229   ALT 24 11/10/2017 1229   ALKPHOS 58 11/10/2017 1229   BILITOT 0.7 11/10/2017 1229   GFRNONAA 76 11/18/2017 1513   GFRAA 88 11/18/2017 1513   Lab Results  Component Value Date   CHOL 104 10/13/2017   HDL 45 (L) 10/13/2017   LDLCALC 39 10/13/2017   TRIG 123 10/13/2017   CHOLHDL 2.3 10/13/2017   Lab Results  Component Value Date   HGBA1C 5.5 05/10/2017   No results found for: VITAMINB12 Lab Results  Component Value Date   TSH 2.36 05/10/2017       ASSESSMENT AND PLAN  History of multiple sclerosis - Considered possible MS  Essential hypertension  Obstructive sleep apnea  Other headache syndrome  Neck pain  History of Bell's palsy   1.   She will remain off of a disease modifying therapy as the diagnosis of MS is unlikely based on the stable nonspecific foci on  MRI. 2.    Use CPAP nightly and try to get at least 6 hours of sleep. She will continue Nuvigil. 3.    Left splenius capitis trigger point injection with 80 mg Depo-Medrol and 3 cc Marcaine using sterile technique.  Headache and  neck pain was better afterwards.   4.  Return in 12 months or sooner if there are new or worsening neurologic symptoms.   Charlton Boule A. Felecia Shelling, MD, PhD 41/93/7902, 40:97 PM Certified in Neurology, Clinical Neurophysiology, Sleep Medicine, Pain Medicine and Neuroimaging  [facility] [address], [place], [place] [address] [phone]

## 2018-02-16 ENCOUNTER — Encounter (HOSPITAL_COMMUNITY): Payer: Self-pay | Admitting: Psychiatry

## 2018-02-16 ENCOUNTER — Ambulatory Visit (INDEPENDENT_AMBULATORY_CARE_PROVIDER_SITE_OTHER): Payer: BLUE CROSS/BLUE SHIELD | Admitting: Psychiatry

## 2018-02-16 DIAGNOSIS — F331 Major depressive disorder, recurrent, moderate: Secondary | ICD-10-CM

## 2018-02-16 NOTE — Progress Notes (Signed)
                                      THERAPIST PROGRESS NOTE          Session Time:    Wednesday 02/16/2018 9:12 AM  -  10:03 AM                      Participation Level: Active  Behavioral Response: Casual/ alert, talkative,                 Type of Therapy: Individual Therapy        Treatment Goals:  1. Learn and implement coping and behavioral strategies to overcome depression.     2. Identify and replace thoughts and beliefs that support depression.                          Treatment Goals addressed: 1,2  Interventions: Supportive,  CBT  Summary: Pamela Walters is a 57 y.o. female who is referred for services by PCP Dr. Moshe Cipro due to patient suffering symptoms of depression. Patient has been seen in the office interminttently for medication management and outpatient psychotherapy since 2016. She recently resumed services and was last seen 4 weeks ago. She reports increased depressed mood, poor motivation, anxiety, decreased interest in activities, diminished pleasure in activities, lack of appetite, excessive worry, and fatigue since last session. Patient also reports increased worry about having another panic attack as she had one about 2 months ago. She expresses frustration with self as she was not able to enjoy the holidays as she has in the past. 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 She also reports she has not   been practicing relaxation techniques consistently.        Patient last was seen about 3 weeks ago. She states being in a funk for about a week or two since last session. This appears to have been triggered by increased headaches due to high blood pressure and increased back pain related to a recent back injury. She reports starting to come out of "down mood".  Her doctor gave her a neck injury that has alleviated some of her headaches. Patient reports managing grief and loss issues better. She says she has not practiced  mindfulness consistently on days off work as she allows other things to get in the say like household chores.   Therapist Response:  Discussed stressors, facilitated expression of thoughts and feelings, validated feelings, assisted patient identify/address thoughts and processes that inhibited practicing mindfulness consistently, assisted patient develop plan to practice mindfulness meditation daily, assisted patient develop list of reasons to complete assignment and consequences for not completing, assigned her  to read daily, reviewed treatment plan and discussed next steps for treatment  Plan: Return again in 2 weeks.   Diagnosis: Axis I: Major Depressive Disorder, Recurrent, Moderate    Axis II: No diagnosis    BYNUM,PEGGY, LCSW 02/16/2018

## 2018-03-07 ENCOUNTER — Ambulatory Visit (INDEPENDENT_AMBULATORY_CARE_PROVIDER_SITE_OTHER): Payer: BLUE CROSS/BLUE SHIELD | Admitting: Family Medicine

## 2018-03-07 ENCOUNTER — Encounter: Payer: Self-pay | Admitting: Family Medicine

## 2018-03-07 VITALS — BP 130/80 | HR 72 | Resp 12 | Ht 63.0 in | Wt 174.0 lb

## 2018-03-07 DIAGNOSIS — R1013 Epigastric pain: Secondary | ICD-10-CM

## 2018-03-07 DIAGNOSIS — F418 Other specified anxiety disorders: Secondary | ICD-10-CM

## 2018-03-07 DIAGNOSIS — E785 Hyperlipidemia, unspecified: Secondary | ICD-10-CM | POA: Diagnosis not present

## 2018-03-07 DIAGNOSIS — R1030 Lower abdominal pain, unspecified: Secondary | ICD-10-CM

## 2018-03-07 DIAGNOSIS — B001 Herpesviral vesicular dermatitis: Secondary | ICD-10-CM

## 2018-03-07 DIAGNOSIS — I10 Essential (primary) hypertension: Secondary | ICD-10-CM

## 2018-03-07 DIAGNOSIS — M549 Dorsalgia, unspecified: Secondary | ICD-10-CM

## 2018-03-07 MED ORDER — ACYCLOVIR 400 MG PO TABS: 400.0000 mg | ORAL_TABLET | Freq: Three times a day (TID) | ORAL | 2 refills | Status: DC

## 2018-03-07 NOTE — Assessment & Plan Note (Addendum)
Recurrent bloating x 4 month, with abdominal pain and recurrent episodes of loose stools accompanied fever . Unable to obtain stool sample for culture when she was symptomatic, refer to GI for further eval

## 2018-03-07 NOTE — Patient Instructions (Addendum)
F/U in 4.5 months, call if you need me before  You are referred to Dr Hilarie Fredrickson also for [place] gall bladder  Medication prescribed for cold sore with 1 refill   Fasting lipid, cmp and EGFR [facility]

## 2018-03-07 NOTE — Progress Notes (Signed)
Pamela Walters     MRN: [id number]      DOB: 05/17/1960   HPI Pamela Walters is here for follow up and re-evaluation of chronic medical conditions, medication management and review of any available recent lab and radiology data.  Preventive health is updated, specifically  Cancer screening and Immunization.   Questions or concerns regarding consultations or procedures which the PT has had in the interim are  addressed. The PT denies any adverse reactions to current medications since the last visit.  States that last week she had acute GI distress started on 11/02 through 02/28/2018, she had vomiting  And loose stool. States 11/04 she spiked a temp to 103 and she was lethargic , took motrin and tylenol, and fever  Broke the night of 11/04, no GI distress since then states appetite and strength are " just getting back" In  Summer she had a severe episode , similar C/o recurrent bloating  C/o cold sore on lower  lip left side x 3 days Plans to go to ortho re back pain and spasm, has appt in am, states she is unable to work with the amount of pain and spasm she has in her back Generally feel;s as though health has deteriorated in past 4 to 6 months, and has mixed dx re   Her neurologic dx , as she is currently being told that she does not have MS when she was dx with this in the past  ROS Denies recent fever or chills. Denies sinus pressure, nasal congestion, ear pain or sore throat. Denies chest congestion, productive cough or wheezing. Denies chest pains, palpitations and leg swelling .   Denies dysuria, frequency, hesitancy or incontinence.  Denies headaches, seizures, numbness, or tingling. C/o increased  Depression and  anxiety  C/o  Rash.on left upper lip x 2 days   PE  BP 130/80   Pulse 72   Resp 12   Ht 5\' 3"  (1.6 m)   Wt 174 lb (78.9 kg)   SpO2 95% Comment: room air  BMI 30.82 kg/m    Patient alert and oriented and in no cardiopulmonary distress.Noted to have jerking of  trunk whch she describes as her back spasms  HEENT: No facial asymmetry, EOMI,   oropharynx pink and moist.  Neck supple no JVD, no mass.herpes labialis flare present  Chest: Clear to auscultation bilaterally.  CVS: S1, S2 no murmurs, no S3.Regular rate.  ABD: Soft no localized   Ext: No edema  MS: decreased  ROM lumbar spine,adequate in  shoulders, hips and knees.  Skin: Intact, herpes labialis flare left lip  Psych: Good eye contact, anxious . Memory intact  anxious and not  depressed appearing.  CNS: CN 2-12 intact, power,  normal throughout.no focal deficits noted.   Assessment & Plan  Dyspepsia Recurrent bloating x 4 month, with abdominal pain and recurrent episodes of loose stools accompanied fever . Unable to obtain stool sample for culture when she was symptomatic, refer to GI for further eval  Essential hypertension Controlled, no change in medication DASH diet and commitment to daily physical activity for a minimum of 30 minutes discussed and encouraged, as a part of hypertension management. The importance of attaining a healthy weight is also discussed.  BP/Weight 03/07/2018 02/11/2018 01/17/2018 01/05/2018 11/18/2017 11/10/2017 7/79/3903  Systolic BP 009 233 007 622 633 354 562  Diastolic BP 80 87 76 80 84 96 70  Wt. (Lbs) 174 173.5 177.8 169 171 174 173.04  BMI  30.82 30.73 31.5 29.94 30.29 30.82 30.65  Some encounter information is confidential and restricted. Go to Review Flowsheets activity to see all data.       Hyperlipidemia LDL goal <100 Hyperlipidemia:Low fat diet discussed and encouraged.   Lipid Panel  Lab Results  Component Value Date   CHOL 205 (H) 03/07/2018   HDL 36 (L) 03/07/2018   LDLCALC 141 (H) 03/07/2018   TRIG 149 03/07/2018   CHOLHDL 5.7 (H) 03/07/2018     Uncontrolled , needs to lower fat intake and comply with diet and medications  Back pain with radiation Increased and uncontrolled with spasm which she states limit her  ability to function, seeing spine surgeon in am  Depression with anxiety Deterioration in mental health in past approx 5 months, chronically worried over her daughter who is not well , as well as her grand daughters.and since the episode of Bell's Palsy earlier this year has " not been well"  Recurrent cold sores Currently experiencing a flare x 1 day, acyclovir is prescribed

## 2018-03-08 ENCOUNTER — Encounter: Payer: Self-pay | Admitting: Family Medicine

## 2018-03-08 DIAGNOSIS — M549 Dorsalgia, unspecified: Secondary | ICD-10-CM | POA: Diagnosis not present

## 2018-03-08 LAB — LIPID PANEL
CHOL/HDL RATIO: 5.7 (calc) — AB (ref <5.0)
Cholesterol: 205 mg/dL — ABNORMAL HIGH (ref <200)
HDL: 36 mg/dL — ABNORMAL LOW (ref >50)
LDL CHOLESTEROL (CALC): 141 mg/dL — AB
NON-HDL CHOLESTEROL (CALC): 169 mg/dL — AB (ref <130)
Triglycerides: 149 mg/dL (ref <150)

## 2018-03-08 LAB — COMPLETE METABOLIC PANEL WITH GFR
AG RATIO: 1.5 (calc) (ref 1.0–2.5)
ALKALINE PHOSPHATASE (APISO): 90 U/L (ref 33–130)
ALT: 18 U/L (ref 6–29)
AST: 18 U/L (ref 10–35)
Albumin: 4.1 g/dL (ref 3.6–5.1)
BUN: 14 mg/dL (ref 7–25)
CO2: 32 mmol/L (ref 20–32)
CREATININE: 0.79 mg/dL (ref 0.50–1.05)
Calcium: 9.6 mg/dL (ref 8.6–10.4)
Chloride: 96 mmol/L — ABNORMAL LOW (ref 98–110)
GFR, EST NON AFRICAN AMERICAN: 82 mL/min/{1.73_m2} (ref >=60)
GFR, Est African American: 95 mL/min/{1.73_m2} (ref >=60)
Globulin: 2.7 g/dL (calc) (ref 1.9–3.7)
Glucose, Bld: 84 mg/dL (ref 65–99)
Potassium: 3.8 mmol/L (ref 3.5–5.3)
Sodium: 139 mmol/L (ref 135–146)
Total Bilirubin: 0.4 mg/dL (ref 0.2–1.2)
Total Protein: 6.8 g/dL (ref 6.1–8.1)

## 2018-03-08 NOTE — Assessment & Plan Note (Signed)
Controlled, no change in medication DASH diet and commitment to daily physical activity for a minimum of 30 minutes discussed and encouraged, as a part of hypertension management. The importance of attaining a healthy weight is also discussed.  BP/Weight 03/07/2018 02/11/2018 01/17/2018 01/05/2018 11/18/2017 11/10/2017 8/44/1712  Systolic BP 787 183 672 550 016 429 037  Diastolic BP 80 87 76 80 84 96 70  Wt. (Lbs) 174 173.5 177.8 169 171 174 173.04  BMI 30.82 30.73 31.5 29.94 30.29 30.82 30.65  Some encounter information is confidential and restricted. Go to Review Flowsheets activity to see all data.

## 2018-03-08 NOTE — Assessment & Plan Note (Addendum)
Hyperlipidemia:Low fat diet discussed and encouraged.   Lipid Panel  Lab Results  Component Value Date   CHOL 205 (H) 03/07/2018   HDL 36 (L) 03/07/2018   LDLCALC 141 (H) 03/07/2018   TRIG 149 03/07/2018   CHOLHDL 5.7 (H) 03/07/2018     Uncontrolled , needs to lower fat intake and comply with diet and medications

## 2018-03-08 NOTE — Assessment & Plan Note (Signed)
Increased and uncontrolled with spasm which she states limit her ability to function, seeing spine surgeon in am

## 2018-03-10 ENCOUNTER — Other Ambulatory Visit: Payer: Self-pay | Admitting: Internal Medicine

## 2018-03-11 ENCOUNTER — Encounter: Payer: Self-pay | Admitting: Family Medicine

## 2018-03-11 DIAGNOSIS — B001 Herpesviral vesicular dermatitis: Secondary | ICD-10-CM | POA: Insufficient documentation

## 2018-03-11 NOTE — Assessment & Plan Note (Signed)
Currently experiencing a flare x 1 day, acyclovir is prescribed

## 2018-03-11 NOTE — Assessment & Plan Note (Signed)
Deterioration in mental health in past approx 5 months, chronically worried over her daughter who is not well , as well as her grand daughters.and since the episode of Bell's Palsy earlier this year has " not been well"

## 2018-03-14 ENCOUNTER — Ambulatory Visit (HOSPITAL_COMMUNITY): Payer: Self-pay | Admitting: Psychiatry

## 2018-03-15 ENCOUNTER — Ambulatory Visit (HOSPITAL_COMMUNITY): Payer: BLUE CROSS/BLUE SHIELD

## 2018-03-16 ENCOUNTER — Ambulatory Visit (HOSPITAL_COMMUNITY): Payer: Self-pay | Admitting: Psychiatry

## 2018-03-18 ENCOUNTER — Ambulatory Visit (HOSPITAL_COMMUNITY): Payer: BLUE CROSS/BLUE SHIELD

## 2018-03-18 ENCOUNTER — Telehealth: Payer: Self-pay

## 2018-03-18 NOTE — Telephone Encounter (Signed)
[facility] - Left Message  

## 2018-03-22 ENCOUNTER — Ambulatory Visit (HOSPITAL_COMMUNITY): Payer: BLUE CROSS/BLUE SHIELD

## 2018-03-22 ENCOUNTER — Ambulatory Visit (HOSPITAL_COMMUNITY): Payer: BLUE CROSS/BLUE SHIELD | Admitting: Psychiatry

## 2018-03-31 ENCOUNTER — Telehealth: Payer: Self-pay

## 2018-03-31 NOTE — Telephone Encounter (Signed)
Patient will be placed on the inactive list with [facility].  Patient will resume services with Maurice Small.

## 2018-04-01 ENCOUNTER — Encounter: Payer: Self-pay | Admitting: Family Medicine

## 2018-04-04 ENCOUNTER — Ambulatory Visit (HOSPITAL_COMMUNITY)
Admission: RE | Admit: 2018-04-04 | Discharge: 2018-04-04 | Disposition: A | Discharge status: Home / Self Care | Payer: BLUE CROSS/BLUE SHIELD | Source: Ambulatory Visit | Attending: Family Medicine | Admitting: Family Medicine

## 2018-04-04 DIAGNOSIS — R140 Abdominal distension (gaseous): Secondary | ICD-10-CM | POA: Diagnosis not present

## 2018-04-04 DIAGNOSIS — K30 Functional dyspepsia: Secondary | ICD-10-CM | POA: Diagnosis not present

## 2018-04-04 DIAGNOSIS — R1013 Epigastric pain: Secondary | ICD-10-CM | POA: Diagnosis not present

## 2018-04-06 ENCOUNTER — Encounter: Payer: Self-pay | Admitting: Family Medicine

## 2018-04-06 ENCOUNTER — Ambulatory Visit (HOSPITAL_COMMUNITY): Payer: Self-pay | Admitting: Psychiatry

## 2018-05-04 ENCOUNTER — Encounter (HOSPITAL_COMMUNITY): Payer: Self-pay | Admitting: Psychiatry

## 2018-05-04 ENCOUNTER — Ambulatory Visit (HOSPITAL_COMMUNITY): Payer: BLUE CROSS/BLUE SHIELD | Admitting: Psychiatry

## 2018-05-04 ENCOUNTER — Ambulatory Visit (INDEPENDENT_AMBULATORY_CARE_PROVIDER_SITE_OTHER): Payer: BLUE CROSS/BLUE SHIELD | Admitting: Psychiatry

## 2018-05-04 DIAGNOSIS — F331 Major depressive disorder, recurrent, moderate: Secondary | ICD-10-CM | POA: Diagnosis not present

## 2018-05-04 NOTE — Progress Notes (Signed)
                                      THERAPIST PROGRESS NOTE          Session Time:    Wednesday 05/04/2018 10:15 AM - 11:04 AM                     Participation Level: Active  Behavioral Response: Casual/ alert, talkative,                 Type of Therapy: Individual Therapy        Treatment Goals:                       1. Learn and implement coping and behavioral strategies to overcome depression.     2. Identify and replace thoughts and beliefs that support depression.                          Treatment Goals addressed: 1,2  Interventions: Supportive,  CBT  Summary: Pamela Walters is a 58 y.o. female who is referred for services by PCP Dr. Moshe Cipro due to patient suffering symptoms of depression. Patient has been seen in the office interminttently for medication management and outpatient psychotherapy since 2016. She recently resumed services and was last seen 4 weeks ago. She reports increased depressed mood, poor motivation, anxiety, decreased interest in activities, diminished pleasure in activities, lack of appetite, excessive worry, and fatigue since last session. Patient also reports increased worry about having another panic attack as she had one about 2 months ago. She expresses frustration with self as she was not able to enjoy the holidays as she has in the past. 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 She also reports she has not   been practicing relaxation techniques consistently.        Patient last was seen in October 2019. She reports health and GI issues prevented her from attending previous appointments. She also reports missing work due to this but feeling better now. She reports increased worry and sadness during the holidays. Patient reports tension among various family members during holiday gatherings. She reports having thoughts of how things used to be and experiencing sadness about daughter's strained relationship with her  children. She also experienced loss issues about the way her relationship used to be with her daughter. Patient reports additional stress and worry about her sister who has health issues.  Therapist Response:  Reviewed symptoms, assisted patient identify triggers of increased sadness, facilitated expression of thoughts and feelings, validated feelings, assisted patient identify thoughts that evoked sadness, assisted patient identify more helpful ways to relate to thoughts, used cognitive defusion, discussed acceptance and pursuing activities consistent with patient's values.   Plan: Return again in 2 weeks.   Diagnosis: Axis I: Major Depressive Disorder, Recurrent, Moderate    Axis II: No diagnosis    Sharla Tankard, LCSW 05/04/2018

## 2018-05-10 ENCOUNTER — Other Ambulatory Visit (HOSPITAL_COMMUNITY): Payer: Self-pay | Admitting: Psychiatry

## 2018-05-16 ENCOUNTER — Encounter (HOSPITAL_COMMUNITY): Payer: Self-pay | Admitting: Psychiatry

## 2018-05-16 ENCOUNTER — Ambulatory Visit (INDEPENDENT_AMBULATORY_CARE_PROVIDER_SITE_OTHER): Payer: BLUE CROSS/BLUE SHIELD | Admitting: Psychiatry

## 2018-05-16 VITALS — BP 125/75 | HR 77 | Ht 63.0 in | Wt 166.0 lb

## 2018-05-16 DIAGNOSIS — F331 Major depressive disorder, recurrent, moderate: Secondary | ICD-10-CM

## 2018-05-16 MED ORDER — PRAZOSIN HCL 1 MG PO CAPS: 1.0000 mg | ORAL_CAPSULE | Freq: Every day | ORAL | 2 refills | Status: DC

## 2018-05-16 MED ORDER — DULOXETINE HCL 60 MG PO CPEP: 60.0000 mg | ORAL_CAPSULE | Freq: Every day | ORAL | 2 refills | Status: DC

## 2018-05-16 MED ORDER — CLONAZEPAM 0.5 MG PO TABS: 0.5000 mg | ORAL_TABLET | Freq: Every day | ORAL | 2 refills | Status: DC | PRN

## 2018-05-16 MED ORDER — DULOXETINE HCL 30 MG PO CPEP: 30.0000 mg | ORAL_CAPSULE | Freq: Every day | ORAL | 2 refills | Status: DC

## 2018-05-16 NOTE — Progress Notes (Signed)
BH MD/PA/NP OP Progress Note  05/16/2018 4:54 PM Pamela Walters  MRN:  [id number]  Chief Complaint:  Chief Complaint    Depression; Follow-up     HPI: This patient is a 57 year old married white female who lives with her husband in [place]. 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  The patient returns for follow-up of treatment for depression and anxiety. She was last seen in August and was switched from Prozac to Cymbalta. She did not feel like the Prozac was helping that much anymore and she was having tonic pain. She states she had been taking the Cymbalta consistently because of her work schedule she kept forgetting. However back in November she was out with her family and had a severe panic attack and had to go to the hospital emergency room for workup. Nothing really showed up but this scared her. For the last couple of weeks she has been taking the Cymbalta every day and she is starting to feel better. She still has periods of feeling shaky and jittery even in her own home. She is generally sleeping well. Her neurologist is still following her MS and she is going to have repeat brain scan in the next month. She occasionally has symptoms like numbness in her hands and feet and gets a bit off balance when she is tired. She is still able to work   The patient returns after 5 months.  She states that she is not quite feeling like she needs to.  She just does not have any energy or motivation.  She feels sad much of the time when she is not at work.  Her appetite is diminished and she is lost about 10 pounds.  She is trying to switch out of nights to day shift but it is happening very gradually.  Most the time she is sleeping well no longer having nightmares.  She does think Cymbalta is help but perhaps it needs to be increased.  I suggest we go from 60 to 90 mg.  She denies suicidal ideation and generally has a positive outlook. Visit Diagnosis:    ICD-10-CM    1. Major depressive disorder, recurrent episode, moderate (HCC) F33.1     Past Psychiatric History: none  Past Medical History:  Past Medical History:  Diagnosis Date  . Allergy   . Back pain   . Bell's palsy 08/2017  . Depression   . GERD (gastroesophageal reflux disease)   . Hepatic steatosis   . Hiatal hernia   . Hyperlipidemia   . Hypertension   . Internal hemorrhoids   . MS (multiple sclerosis) ([place])    2007  . Multiple sclerosis ([place])   . Multiple sclerosis ([place]) 04/04/2008   Qualifier: Diagnosis of  By: Moshe Cipro MD, Joycelyn Schmid    . Schatzki's ring   . Sleep apnea    wears CPAP    Past Surgical History:  Procedure Laterality Date  . ABDOMINAL HYSTERECTOMY  2005   fibroids  . CARDIAC CATHETERIZATION N/A 05/04/2016   Procedure: Left Heart Cath and Coronary Angiography;  Surgeon: Jettie Booze, MD;  Location: [facility];  Service: Cardiovascular;  Laterality: N/A;  . ESOPHAGOGASTRODUODENOSCOPY N/A 09/18/2013   Procedure: ESOPHAGOGASTRODUODENOSCOPY (EGD);  Surgeon: Jerene Bears, MD;  Location: [facility];  Service: Gastroenterology;  Laterality: N/A;  . UPPER GASTROINTESTINAL ENDOSCOPY    . WISDOM TOOTH EXTRACTION      Family Psychiatric History: See below  Family History:  Family History  Problem Relation Age of Onset  . Hypertension Mother   . Hyperlipidemia Mother   . Depression Mother   . Hypertension Father   . Colon cancer Neg Hx   . Esophageal cancer Neg Hx   . Rectal cancer Neg Hx   . Stomach cancer Neg Hx     Social History:  Social History   Socioeconomic History  . Marital status: Married    Spouse name: Not on file  . Number of children: Not on file  . Years of education: Not on file  . Highest education level: Not on file  Occupational History  . Not on file  Social Needs  . Financial resource strain: Not on file  . Food insecurity:    Worry: Not on file    Inability: Not on file  . Transportation needs:    Medical: Not  on file    Non-medical: Not on file  Tobacco Use  . Smoking status: Never Smoker  . Smokeless tobacco: Never Used  Substance and Sexual Activity  . Alcohol use: No  . Drug use: No  . Sexual activity: Yes    Birth control/protection: Surgical  Lifestyle  . Physical activity:    Days per week: Not on file    Minutes per session: Not on file  . Stress: Not on file  Relationships  . Social connections:    Talks on phone: Not on file    Gets together: Not on file    Attends religious service: Not on file    Active member of club or organization: Not on file    Attends meetings of clubs or organizations: Not on file    Relationship status: Not on file  Other Topics Concern  . Not on file  Social History Narrative  . Not on file    Allergies: No Known Allergies  Metabolic Disorder Labs: Lab Results  Component Value Date   HGBA1C 5.5 05/10/2017   MPG 111 05/10/2017   MPG 108 02/20/2016   No results found for: PROLACTIN Lab Results  Component Value Date   CHOL 205 (H) 03/07/2018   TRIG 149 03/07/2018   HDL 36 (L) 03/07/2018   CHOLHDL 5.7 (H) 03/07/2018   VLDL 34 (H) 06/30/2016   LDLCALC 141 (H) 03/07/2018   LDLCALC 39 10/13/2017   Lab Results  Component Value Date   TSH 2.36 05/10/2017   TSH 4.02 02/20/2016    Therapeutic Level Labs: No results found for: LITHIUM No results found for: VALPROATE No components found for:  CBMZ  Current Medications: Current Outpatient Medications  Medication Sig Dispense Refill  . acyclovir (ZOVIRAX) 400 MG tablet Take 1 tablet (400 mg total) by mouth 3 (three) times daily. 21 tablet 2  . amLODipine (NORVASC) 2.5 MG tablet Take 1 tablet (2.5 mg total) by mouth daily. 90 tablet 3  . Armodafinil 250 MG tablet TAKE 1 TABLET BY MOUTH  DAILY 30 tablet 5  . aspirin 81 MG tablet Take 81 mg by mouth daily.    . clonazePAM (KLONOPIN) 0.5 MG tablet Take 1 tablet (0.5 mg total) by mouth daily as needed for anxiety. 30 tablet 2  .  DULoxetine (CYMBALTA) 60 MG capsule Take 1 capsule (60 mg total) by mouth daily. 30 capsule 2  . ezetimibe (ZETIA) 10 MG tablet TAKE 1 TABLET BY MOUTH ONCE DAILY 30 tablet 5  . gabapentin (NEURONTIN) 600 MG tablet Take 1 tablet (600 mg total) by mouth 3 (three) times daily.  270 tablet 3  . metaxalone (SKELAXIN) 800 MG tablet Take 800 mg by mouth 2 (two) times daily as needed for muscle spasms.     . mometasone (NASONEX) 50 MCG/ACT nasal spray USE 2 SPRAYS NASALLY DAILY 51 g 1  . Multiple Vitamin (MULTIVITAMIN WITH MINERALS) TABS tablet Take 1 tablet by mouth daily.    Marland Kitchen oxybutynin (DITROPAN-XL) 10 MG 24 hr tablet TAKE 1 TABLET BY MOUTH AT  BEDTIME 90 tablet 3  . pantoprazole (PROTONIX) 40 MG tablet Take 1 tablet (40 mg total) by mouth every morning. 90 tablet 0  . prazosin (MINIPRESS) 1 MG capsule Take 1 capsule (1 mg total) by mouth at bedtime. 30 capsule 2  . promethazine (PHENERGAN) 25 MG tablet Take 1 tablet (25 mg total) by mouth every 8 (eight) hours as needed for nausea. 8 tablet 0  . ranitidine (ZANTAC) 150 MG tablet Take 1 tablet (150 mg total) by mouth at bedtime. 90 tablet 0  . rosuvastatin (CRESTOR) 40 MG tablet TAKE 1 TABLET BY MOUTH ONCE DAILY 30 tablet 11  . traMADol (ULTRAM) 50 MG tablet TAKE 1 TO 2 TABLETS BY  MOUTH UP TO 4 PILLS DAILY  AS NEEDED , BUT NO MORE THAN 90 TABLETS PER MONTH 270 tablet 1  . triamterene-hydrochlorothiazide (MAXZIDE-25) 37.5-25 MG tablet TAKE 1 TABLET BY MOUTH  DAILY 90 tablet 1  . DULoxetine (CYMBALTA) 30 MG capsule Take 1 capsule (30 mg total) by mouth daily. 30 capsule 2   No current facility-administered medications for this visit.    Facility-Administered Medications Ordered in Other Visits  Medication Dose Route Frequency Provider Last Rate Last Dose  . gadopentetate dimeglumine (MAGNEVIST) injection 20 mL  20 mL Intravenous Once PRN Sater, Nanine Means, MD         Musculoskeletal: Strength & Muscle Tone: within normal limits Gait & Station:  normal Patient leans: N/A  Psychiatric Specialty Exam: Review of Systems  Constitutional: Positive for malaise/fatigue and weight loss.  Musculoskeletal: Positive for back pain.  Psychiatric/Behavioral: Positive for depression.  All other systems reviewed and are negative.   Blood pressure 125/75, pulse 77, height 5\' 3"  (1.6 m), weight 166 lb (75.3 kg), SpO2 97 %.Body mass index is 29.41 kg/m.  General Appearance: Casual and Fairly Groomed  Eye Contact:  Good  Speech:  Clear and Coherent  Volume:  Normal  Mood:  Dysphoric  Affect:  Constricted  Thought Process:  Goal Directed  Orientation:  Full (Time, Place, and Person)  Thought Content: Rumination   Suicidal Thoughts:  No  Homicidal Thoughts:  No  Memory:  Immediate;   Good Recent;   Good Remote;   Good  Judgement:  Good  Insight:  Good  Psychomotor Activity:  Decreased  Concentration:  Concentration: Good and Attention Span: Good  Recall:  Good  Fund of Knowledge: Good  Language: Good  Akathisia:  No  Handed:  Right  AIMS (if indicated): not done  Assets:  Communication Skills Desire for Improvement Resilience Social Support Talents/Skills  ADL's:  Intact  Cognition: WNL  Sleep:  Good   Screenings: PHQ2-9     Office Visit from 03/07/2018 in [facility] Office Visit from 01/05/2018 in [facility] from 12/22/2017 in [facility] Office Visit from 11/10/2017 in [facility] Office Visit from 10/18/2017 in [facility]  PHQ-2 Total Score  2  3  1   (Pended)   2  2  PHQ-9 Total Score  10  12  -  10  8       Assessment and Plan: Patient is a 58 year old female with a history of depression and anxiety as well as nightmares.  Her depression still is not totally ameliorated and I suggested we go up on her Cymbalta to 90 mg daily for depression.  She will continue prazosin 1 mg at bedtime for nightmares and clonazepam 0.5 mg  daily as needed for anxiety.  She will return to see me in 6 weeks so we can reassess how the increase is working.   [name], MD 05/16/2018, 4:54 PM

## 2018-05-18 ENCOUNTER — Ambulatory Visit (INDEPENDENT_AMBULATORY_CARE_PROVIDER_SITE_OTHER): Payer: BLUE CROSS/BLUE SHIELD | Admitting: Psychiatry

## 2018-05-18 DIAGNOSIS — F331 Major depressive disorder, recurrent, moderate: Secondary | ICD-10-CM

## 2018-05-18 NOTE — Progress Notes (Signed)
                                      THERAPIST PROGRESS NOTE          Session Time:    Wednesday 05/18/2018 9:10 AM - 10:03 AM                 Participation Level: Active  Behavioral Response: Casual/ alert, talkative,                 Type of Therapy: Individual Therapy        Treatment Goals:                       1. Learn and implement coping and behavioral strategies to overcome depression.      2. Identify and replace thoughts and beliefs that support depression.                          Treatment Goals addressed: 1,2  Interventions: Supportive,  CBT  Summary: Pamela Walters is a 58 y.o. female who is referred for services by PCP Dr. Moshe Cipro due to patient suffering symptoms of depression. Patient has been seen in the office interminttently for medication management and outpatient psychotherapy since 2016. She recently resumed services and was last seen 4 weeks ago. She reports increased depressed mood, poor motivation, anxiety, decreased interest in activities, diminished pleasure in activities, lack of appetite, excessive worry, and fatigue since last session. Patient also reports increased worry about having another panic attack as she had one about 2 months ago. She expresses frustration with self as she was not able to enjoy the holidays as she has in the past. 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 She also reports she has not   been practicing relaxation techniques consistently.        Patient last was seen about 3 weeks ago. She reports increased fatigue and having to push self. She reports continued medication compliance and denies any significant stressors. She reports positive marital relationship and acceptance of daughter being the way she is. However, she does report difficulty adjusting to not having someone to take care of in her household as children are adults and she no longer has responsibility for taking care of  grandchildren. She continues to work 3rd shift but is considering going to first shift. She reports significant weight loss and states she just doesn't have much appetite. She reports frequently skipping meals and low caloric intake.    Therapist Response:  Reviewed symptoms, assisted patient identify possible triggers of increased fatigue, reviewed the role of self-care, assisted patient develop plan to improve eating patterns, assisted patient develop plan to improve eating patterns, assisted patient to develop list of benefits of improving eating patterns and consequences of not improving eating patterns, assigned patient to read lists daily and implement plan developed in session, Plan: Return again in 2 weeks.   Diagnosis: Axis I: Major Depressive Disorder, Recurrent, Moderate    Axis II: No diagnosis    Alonza Smoker, LCSW 05/18/2018

## 2018-05-20 ENCOUNTER — Ambulatory Visit: Payer: BLUE CROSS/BLUE SHIELD | Admitting: Neurology

## 2018-06-01 ENCOUNTER — Encounter (HOSPITAL_COMMUNITY): Payer: Self-pay | Admitting: Psychiatry

## 2018-06-01 ENCOUNTER — Ambulatory Visit (INDEPENDENT_AMBULATORY_CARE_PROVIDER_SITE_OTHER): Payer: BLUE CROSS/BLUE SHIELD | Admitting: Psychiatry

## 2018-06-01 DIAGNOSIS — F331 Major depressive disorder, recurrent, moderate: Secondary | ICD-10-CM

## 2018-06-01 NOTE — Progress Notes (Signed)
                                      THERAPIST PROGRESS NOTE          Session Time:    Wednesday 06/01/2018 9:15 AM - 10:10 AM               Participation Level: Active  Behavioral Response: Casual/ alert, talkative,                 Type of Therapy: Individual Therapy        Treatment Goals:                       1. Learn and implement coping and behavioral strategies to overcome depression.      2. Identify and replace thoughts and beliefs that support depression.                          Treatment Goals addressed: 1,2  Interventions: Supportive,  CBT  Summary: Pamela Walters is a 58 y.o. female who is referred for services by PCP Dr. Moshe Cipro due to patient suffering symptoms of depression. Patient has been seen in the office interminttently for medication management and outpatient psychotherapy since 2016. She recently resumed services and was last seen 4 weeks ago. She reports increased depressed mood, poor motivation, anxiety, decreased interest in activities, diminished pleasure in activities, lack of appetite, excessive worry, and fatigue since last session. Patient also reports increased worry about having another panic attack as she had one about 2 months ago. She expresses frustration with self as she was not able to enjoy the holidays as she has in the past. 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 She also reports she has not   been practicing relaxation techniques consistently.        Patient last was seen about 2 weeks ago. She reports continued fatigue, poor motivation, depressed mood, and decreased interest in activities. She reports continued medication compliance and denies any significant stressors. She reports continued positive marital relationship and acceptance of daughter being the way she is. She reports she has picked up extra shifts at work including some 12 hour shifts. She reports doing this in preparation for upcoming family  trip. She reports improving eating patterns somewhat but still reports very low caloric intake as she has no appetite. She also expresses frustration with self that she has very low energy and does not accomplish goals for self (completing household chores) as a result.  Therapist Response:  Reviewed symptoms, administered PHQ-9 assisted patient identify possible triggers of continued symptoms of depression, reviewed treatment plan, reviewed the role of self-care, assisted patient identify thoughts and processes that inhibited plan to improve eating patterns, discussed connection between thoughts/mood/behaviors, began to discuss patient's values and identify ways to change how patient relates to unhelpful thoughts that interfere with patient pursuing goals consistent with her values.  Plan: Return again in 2 weeks.   Diagnosis: Axis I: Major Depressive Disorder, Recurrent, Moderate    Axis II: No diagnosis    Alonza Smoker, LCSW 06/01/2018

## 2018-06-15 ENCOUNTER — Encounter (HOSPITAL_COMMUNITY): Payer: Self-pay | Admitting: Psychiatry

## 2018-06-15 ENCOUNTER — Ambulatory Visit (INDEPENDENT_AMBULATORY_CARE_PROVIDER_SITE_OTHER): Payer: BLUE CROSS/BLUE SHIELD | Admitting: Psychiatry

## 2018-06-15 DIAGNOSIS — F331 Major depressive disorder, recurrent, moderate: Secondary | ICD-10-CM | POA: Diagnosis not present

## 2018-06-15 NOTE — Progress Notes (Signed)
                 THERAPIST PROGRESS NOTE          Session Time:    Wednesday 06/15/2018 9:10 AM - 10:10 AM               Participation Level: Active  Behavioral Response: Casual/ alert, talkative,                 Type of Therapy: Individual Therapy        Treatment Goals:                       1. Learn and implement coping and behavioral strategies to overcome depression.      2. Identify and replace thoughts and beliefs that support depression.                                 Treatment Goals addressed: 1,2  Interventions: Supportive,  CBT  Summary: Pamela Walters is a 58 y.o. female who is referred for services by PCP Dr. Moshe Cipro due to patient suffering symptoms of depression. Patient has been seen in the office interminttently for medication management and outpatient psychotherapy since 2016. She recently resumed services and was last seen 4 weeks ago. She reports increased depressed mood, poor motivation, anxiety, decreased interest in activities, diminished pleasure in activities, lack of appetite, excessive worry, and fatigue since last session. Patient also reports increased worry about having another panic attack as she had one about 2 months ago. She expresses frustration with self as she was not able to enjoy the holidays as she has in the past. 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 She also reports she has not   been practicing relaxation techniques consistently.        Patient last was seen about 2 weeks ago. She reports continued fatigue, poor motivation, depressed mood, and decreased interest in activities. She reports continued medication compliance and continues to deny  any significant stressors. She reports working extra hours. She also reports working day shift a couple of days and noticing she felt better working during the day. She reports contemplating going to first shift. She still reports little activity beyond work. She says  she has not started exercising due to fatigue and poor motivation. She reports poor appetite and skipping lunch. She also has continued erratic sleep patterns.  She expresses frustration with self that she has very low energy and does not accomplish goals for self (completing household chores) as a result.  Therapist Response:  Reviewed symptoms, discussed the vicious cycle of depression and using behavioral activation to intervene, provided patient with handout of cycle to review, assisted patient develop plan to increase activity by going to gym 2 x per week for 1 hour, assisted patient identify and address thoughts and processes that may inhibit implementation of plan,   Plan: Return again in 2 weeks.   Diagnosis: Axis I: Major Depressive Disorder, Recurrent, Moderate    Axis II: No diagnosis    Alonza Smoker, LCSW 06/15/2018

## 2018-06-27 ENCOUNTER — Encounter (HOSPITAL_COMMUNITY): Payer: Self-pay | Admitting: Psychiatry

## 2018-06-27 ENCOUNTER — Ambulatory Visit (INDEPENDENT_AMBULATORY_CARE_PROVIDER_SITE_OTHER): Payer: BLUE CROSS/BLUE SHIELD | Admitting: Psychiatry

## 2018-06-27 ENCOUNTER — Ambulatory Visit (HOSPITAL_COMMUNITY): Payer: BLUE CROSS/BLUE SHIELD | Admitting: Psychiatry

## 2018-06-27 VITALS — BP 125/72 | HR 80 | Ht 63.0 in | Wt 167.0 lb

## 2018-06-27 DIAGNOSIS — F331 Major depressive disorder, recurrent, moderate: Secondary | ICD-10-CM | POA: Diagnosis not present

## 2018-06-27 MED ORDER — PRAZOSIN HCL 1 MG PO CAPS: 1.0000 mg | ORAL_CAPSULE | Freq: Every day | ORAL | 2 refills | Status: DC

## 2018-06-27 MED ORDER — DULOXETINE HCL 30 MG PO CPEP: 30.0000 mg | ORAL_CAPSULE | Freq: Every day | ORAL | 2 refills | Status: DC

## 2018-06-27 MED ORDER — CLONAZEPAM 0.5 MG PO TABS: 0.5000 mg | ORAL_TABLET | Freq: Every day | ORAL | 2 refills | Status: DC | PRN

## 2018-06-27 MED ORDER — DULOXETINE HCL 60 MG PO CPEP: 60.0000 mg | ORAL_CAPSULE | Freq: Every day | ORAL | 2 refills | Status: DC

## 2018-06-27 NOTE — Progress Notes (Signed)
[facility] MD/PA/NP OP Progress Note  06/27/2018 1:37 PM Pamela Walters  MRN:  [id number]  Chief Complaint:  Chief Complaint    Anxiety; Depression; Follow-up     HPI: This patient is a 58 year old married white female who lives with her husband in [place]. 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  The patient returns for follow-up of treatment for depression and anxiety. She was last seen in August and was switched from Prozac to Cymbalta. She did not feel like the Prozac was helping that much anymore and she was having tonic pain. She states she had been taking the Cymbalta consistently because of her work schedule she kept forgetting. However back in November she was out with her family and had a severe panic attack and had to go to the hospital emergency room for workup. Nothing really showed up but this scared her. For the last couple of weeks she has been taking the Cymbalta every day and she is starting to feel better. She still has periods of feeling shaky and jittery even in her own home. She is generally sleeping well. Her neurologist is still following her MS and she is going to have repeat brain scan in the next month. She occasionally has symptoms like numbness in her hands and feet and gets a bit off balance when she is tired. She is still able to work   The patient returns after 6 weeks.  Last time she was feeling depressed and sluggish so we increased Cymbalta from 60 to 90 mg daily.  She states that she is doing much better.  She has more energy although perhaps not as much as she would like.  She is working with her therapist here to try to motivate herself more.  She generally sleeps fairly well even though she has to sleep during the day because she often works nights.  Her mood is definitely better and she is looking forward to the spring and summer.  She does not want to add any more augmentation agents but just try to stick with the Cymbalta 90 mg  daily.  She is no longer having nightmares with the prazosin. Visit Diagnosis:    ICD-10-CM   1. Major depressive disorder, recurrent episode, moderate (HCC) F33.1     Past Psychiatric History: none  Past Medical History:  Past Medical History:  Diagnosis Date  . Allergy   . Back pain   . Bell's palsy 08/2017  . Depression   . GERD (gastroesophageal reflux disease)   . Hepatic steatosis   . Hiatal hernia   . Hyperlipidemia   . Hypertension   . Internal hemorrhoids   . MS (multiple sclerosis) ([facility])    2007  . Multiple sclerosis ([facility])   . Multiple sclerosis ([facility]) 04/04/2008   Qualifier: Diagnosis of  By: Moshe Cipro MD, Joycelyn Schmid    . Schatzki's ring   . Sleep apnea    wears CPAP    Past Surgical History:  Procedure Laterality Date  . ABDOMINAL HYSTERECTOMY  2005   fibroids  . CARDIAC CATHETERIZATION N/A 05/04/2016   Procedure: Left Heart Cath and Coronary Angiography;  Surgeon: Jettie Booze, MD;  Location: [facility];  Service: Cardiovascular;  Laterality: N/A;  . ESOPHAGOGASTRODUODENOSCOPY N/A 09/18/2013   Procedure: ESOPHAGOGASTRODUODENOSCOPY (EGD);  Surgeon: Jerene Bears, MD;  Location: [facility];  Service: Gastroenterology;  Laterality: N/A;  . UPPER GASTROINTESTINAL ENDOSCOPY    . WISDOM TOOTH EXTRACTION  Family Psychiatric History: See below  Family History:  Family History  Problem Relation Age of Onset  . Hypertension Mother   . Hyperlipidemia Mother   . Depression Mother   . Hypertension Father   . Colon cancer Neg Hx   . Esophageal cancer Neg Hx   . Rectal cancer Neg Hx   . Stomach cancer Neg Hx     Social History:  Social History   Socioeconomic History  . Marital status: Married    Spouse name: Not on file  . Number of children: Not on file  . Years of education: Not on file  . Highest education level: Not on file  Occupational History  . Not on file  Social Needs  . Financial resource strain: Not on file  . Food  insecurity:    Worry: Not on file    Inability: Not on file  . Transportation needs:    Medical: Not on file    Non-medical: Not on file  Tobacco Use  . Smoking status: Never Smoker  . Smokeless tobacco: Never Used  Substance and Sexual Activity  . Alcohol use: No  . Drug use: No  . Sexual activity: Yes    Birth control/protection: Surgical  Lifestyle  . Physical activity:    Days per week: Not on file    Minutes per session: Not on file  . Stress: Not on file  Relationships  . Social connections:    Talks on phone: Not on file    Gets together: Not on file    Attends religious service: Not on file    Active member of club or organization: Not on file    Attends meetings of clubs or organizations: Not on file    Relationship status: Not on file  Other Topics Concern  . Not on file  Social History Narrative  . Not on file    Allergies: No Known Allergies  Metabolic Disorder Labs: Lab Results  Component Value Date   HGBA1C 5.5 05/10/2017   MPG 111 05/10/2017   MPG 108 02/20/2016   No results found for: PROLACTIN Lab Results  Component Value Date   CHOL 205 (H) 03/07/2018   TRIG 149 03/07/2018   HDL 36 (L) 03/07/2018   CHOLHDL 5.7 (H) 03/07/2018   VLDL 34 (H) 06/30/2016   LDLCALC 141 (H) 03/07/2018   LDLCALC 39 10/13/2017   Lab Results  Component Value Date   TSH 2.36 05/10/2017   TSH 4.02 02/20/2016    Therapeutic Level Labs: No results found for: LITHIUM No results found for: VALPROATE No components found for:  CBMZ  Current Medications: Current Outpatient Medications  Medication Sig Dispense Refill  . acyclovir (ZOVIRAX) 400 MG tablet Take 1 tablet (400 mg total) by mouth 3 (three) times daily. 21 tablet 2  . amLODipine (NORVASC) 2.5 MG tablet Take 1 tablet (2.5 mg total) by mouth daily. 90 tablet 3  . Armodafinil 250 MG tablet TAKE 1 TABLET BY MOUTH  DAILY 30 tablet 5  . aspirin 81 MG tablet Take 81 mg by mouth daily.    . clonazePAM (KLONOPIN)  0.5 MG tablet Take 1 tablet (0.5 mg total) by mouth daily as needed for anxiety. 30 tablet 2  . DULoxetine (CYMBALTA) 30 MG capsule Take 1 capsule (30 mg total) by mouth daily. 30 capsule 2  . DULoxetine (CYMBALTA) 60 MG capsule Take 1 capsule (60 mg total) by mouth daily. 30 capsule 2  . ezetimibe (ZETIA) 10 MG tablet TAKE  1 TABLET BY MOUTH ONCE DAILY 30 tablet 5  . gabapentin (NEURONTIN) 600 MG tablet Take 1 tablet (600 mg total) by mouth 3 (three) times daily. 270 tablet 3  . metaxalone (SKELAXIN) 800 MG tablet Take 800 mg by mouth 2 (two) times daily as needed for muscle spasms.     . mometasone (NASONEX) 50 MCG/ACT nasal spray USE 2 SPRAYS NASALLY DAILY 51 g 1  . Multiple Vitamin (MULTIVITAMIN WITH MINERALS) TABS tablet Take 1 tablet by mouth daily.    Marland Kitchen oxybutynin (DITROPAN-XL) 10 MG 24 hr tablet TAKE 1 TABLET BY MOUTH AT  BEDTIME 90 tablet 3  . pantoprazole (PROTONIX) 40 MG tablet Take 1 tablet (40 mg total) by mouth every morning. 90 tablet 0  . prazosin (MINIPRESS) 1 MG capsule Take 1 capsule (1 mg total) by mouth at bedtime. 30 capsule 2  . promethazine (PHENERGAN) 25 MG tablet Take 1 tablet (25 mg total) by mouth every 8 (eight) hours as needed for nausea. 8 tablet 0  . ranitidine (ZANTAC) 150 MG tablet Take 1 tablet (150 mg total) by mouth at bedtime. 90 tablet 0  . rosuvastatin (CRESTOR) 40 MG tablet TAKE 1 TABLET BY MOUTH ONCE DAILY 30 tablet 11  . traMADol (ULTRAM) 50 MG tablet TAKE 1 TO 2 TABLETS BY  MOUTH UP TO 4 PILLS DAILY  AS NEEDED , BUT NO MORE THAN 90 TABLETS PER MONTH 270 tablet 1  . triamterene-hydrochlorothiazide (MAXZIDE-25) 37.5-25 MG tablet TAKE 1 TABLET BY MOUTH  DAILY 90 tablet 1   No current facility-administered medications for this visit.    Facility-Administered Medications Ordered in Other Visits  Medication Dose Route Frequency Provider Last Rate Last Dose  . gadopentetate dimeglumine (MAGNEVIST) injection 20 mL  20 mL Intravenous Once PRN Sater, Nanine Means,  MD         Musculoskeletal: Strength & Muscle Tone: within normal limits Gait & Station: normal Patient leans: N/A  Psychiatric Specialty Exam: Review of Systems  Musculoskeletal: Positive for back pain.  All other systems reviewed and are negative.   Blood pressure 125/72, pulse 80, height 5\' 3"  (1.6 m), weight 167 lb (75.8 kg), SpO2 95 %.Body mass index is 29.58 kg/m.  General Appearance: Casual and Fairly Groomed  Eye Contact:  Good  Speech:  Clear and Coherent  Volume:  Normal  Mood:  Euthymic  Affect:  Appropriate and Congruent  Thought Process:  Goal Directed  Orientation:  Full (Time, Place, and Person)  Thought Content: Rumination   Suicidal Thoughts:  No  Homicidal Thoughts:  No  Memory:  Immediate;   Good Recent;   Good Remote;   Good  Judgement:  Good  Insight:  Good  Psychomotor Activity:  Decreased  Concentration:  Concentration: Good and Attention Span: Good  Recall:  Good  Fund of Knowledge: Good  Language: Good  Akathisia:  No  Handed:  Right  AIMS (if indicated): not done  Assets:  Communication Skills Desire for Improvement Resilience Social Support Talents/Skills  ADL's:  Intact  Cognition: WNL  Sleep:  Good   Screenings: PHQ2-9     Counselor from 06/01/2018 in [facility] Office Visit from 03/07/2018 in [facility] Office Visit from 01/05/2018 in [facility] from 12/22/2017 in [facility] Office Visit from 11/10/2017 in [facility]  PHQ-2 Total Score  4  2  3  1   (Pended)   2  PHQ-9 Total Score  16  10  12  -  10       Assessment and Plan: This patient is a 58 year old female with a history of depression and anxiety.  She is doing better with the increase in Cymbalta.  She will continue Cymbalta 90 mg daily for depression, clonazepam 0.5 mg daily as needed for anxiety and prazosin 1 mg at bedtime for nightmares.   She will return to see me in 3 months   [name], MD 06/27/2018, 1:37 PM

## 2018-06-28 ENCOUNTER — Encounter: Payer: Self-pay | Admitting: *Deleted

## 2018-06-29 ENCOUNTER — Ambulatory Visit (INDEPENDENT_AMBULATORY_CARE_PROVIDER_SITE_OTHER): Payer: BLUE CROSS/BLUE SHIELD | Admitting: Psychiatry

## 2018-06-29 DIAGNOSIS — F331 Major depressive disorder, recurrent, moderate: Secondary | ICD-10-CM | POA: Diagnosis not present

## 2018-06-29 NOTE — Progress Notes (Signed)
                 THERAPIST PROGRESS NOTE          Session Time:    Wednesday 06/29/2018 9:20  AM -  10:05 AM            Participation Level: Active  Behavioral Response: Casual/ alert, talkative,                 Type of Therapy: Individual Therapy        Treatment Goals:                       1. Learn and implement coping and behavioral strategies to overcome depression.      2. Identify and replace thoughts and beliefs that support depression.                                 Treatment Goals addressed: 1,2  Interventions: Supportive,  CBT  Summary: Pamela Walters is a 58 y.o. female who is referred for services by PCP Dr. Moshe Cipro due to patient suffering symptoms of depression. Patient has been seen in the office interminttently for medication management and outpatient psychotherapy since 2016. She recently resumed services and was last seen 4 weeks ago. She reports increased depressed mood, poor motivation, anxiety, decreased interest in activities, diminished pleasure in activities, lack of appetite, excessive worry, and fatigue since last session. Patient also reports increased worry about having another panic attack as she had one about 2 months ago. She expresses frustration with self as she was not able to enjoy the holidays as she has in the past. 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 She also reports she has not   been practicing relaxation techniques consistently.        Patient last was seen about 2 weeks ago. She states doing better and having a little more energy. She reports going to gym 3 times since last session. She walked on treadmill and did strength training. She reports having a hard time going but feeling better once she went. She states still having to push self but doing more things around the house like chores. She went on a weekend trip with family and enjoyed this. She is looking forward to visiting her [place]. She continues to struggle with self-care regarding eating patterns but has been eating breakfast more frequently. She continues to report no appetite  Therapist Response:  Reviewed symptoms, praised and reinforced patient going to gym, discussed effects on mood.thoughts/behavior, developed plan with patient to continue going to gym 2 x per week for one hour,  praised and reinforced patient's increased socialization and planning trip to see grandchildren, discussed effects on mood/thoughts/behavior, discussed the role of nutrition and self-care in managing depression, assisted patient develop plan to improve eating patterns ( prepare and eat mini meals 3-4 x per day) assisted patient identify and address thoughts and processes that may inhibit implementation of plan  Plan: Return again in 2 weeks.            Diagnosis: Axis I: Major Depressive Disorder, Recurrent, Moderate    Axis II: No diagnosis    Alonza Smoker, LCSW 06/29/2018

## 2018-07-13 ENCOUNTER — Ambulatory Visit (HOSPITAL_COMMUNITY): Payer: BLUE CROSS/BLUE SHIELD | Admitting: Psychiatry

## 2018-07-20 ENCOUNTER — Ambulatory Visit: Payer: Self-pay | Admitting: Family Medicine

## 2018-07-27 ENCOUNTER — Other Ambulatory Visit: Payer: Self-pay

## 2018-07-27 ENCOUNTER — Ambulatory Visit: Payer: BLUE CROSS/BLUE SHIELD | Admitting: Family Medicine

## 2018-07-28 ENCOUNTER — Other Ambulatory Visit: Payer: Self-pay | Admitting: Neurology

## 2018-07-28 NOTE — Telephone Encounter (Signed)
Drug registry checked last fill 03-10-2018 # 30.  (script written 12-10-18).  Has appt 10/19//20 with Dr. Felecia Shelling.

## 2018-08-01 ENCOUNTER — Telehealth: Payer: Self-pay | Admitting: Neurology

## 2018-08-01 NOTE — Telephone Encounter (Signed)
Avi from [facility] called needing a PA for pts Armodefinil 250mg 

## 2018-08-02 NOTE — Telephone Encounter (Signed)
PA has already been done and denied. Appeal sent stating pt has dx of OSA on CPAP. Faxed answered appeal questions and sleep study from 2016 for supporting documentation to [facility] at [phone]. Received fax confirmation. Waiting on determination.

## 2018-08-02 NOTE — Telephone Encounter (Signed)
[facility] has called to inform of the approval of the Armodafinil 250 MG tablet effective date 08-02-2018 until 02-01-2019

## 2018-08-02 NOTE — Telephone Encounter (Signed)
Noted  

## 2018-08-10 ENCOUNTER — Ambulatory Visit (HOSPITAL_COMMUNITY): Payer: BLUE CROSS/BLUE SHIELD | Admitting: Psychiatry

## 2018-08-10 ENCOUNTER — Other Ambulatory Visit: Payer: Self-pay

## 2018-08-16 ENCOUNTER — Ambulatory Visit (HOSPITAL_COMMUNITY): Payer: BLUE CROSS/BLUE SHIELD | Admitting: Psychiatry

## 2018-08-16 ENCOUNTER — Ambulatory Visit (INDEPENDENT_AMBULATORY_CARE_PROVIDER_SITE_OTHER): Payer: BLUE CROSS/BLUE SHIELD | Admitting: Psychiatry

## 2018-08-16 ENCOUNTER — Other Ambulatory Visit: Payer: Self-pay

## 2018-08-16 DIAGNOSIS — F331 Major depressive disorder, recurrent, moderate: Secondary | ICD-10-CM | POA: Diagnosis not present

## 2018-08-16 NOTE — Progress Notes (Signed)
Virtual Visit via Video Note  I connected with Pamela Walters on 08/16/18 at  9:00 AM EDT by a video enabled telemedicine application and verified that I am speaking with the correct person using two identifiers.   I discussed the limitations of evaluation and management by telemedicine and the availability of in person appointments. The patient expressed understanding and agreed to proceed.  I provided 30 minutes of non-face-to-face time during this encounter.   Alonza Smoker, LCSW                   THERAPIST PROGRESS NOTE          Session Time:    Tuesday 08/16/2018 9:00 AM - 9:30 AM       Participation Level: Active  Behavioral Response: Casual/ alert, talkative,                 Type of Therapy: Individual Therapy        Treatment Goals:                       1. Learn and implement coping and behavioral strategies to overcome depression.      2. Identify and replace thoughts and beliefs that support depression.                                 Treatment Goals addressed: 1,2  Interventions: Supportive,  CBT  Summary: Pamela Walters is a 58 y.o. female who is referred for services by PCP Dr. Moshe Cipro due to patient suffering symptoms of depression. Patient has been seen in the office interminttently for medication management and outpatient psychotherapy since 2016. She recently resumed services and was last seen 4 weeks ago. She reports increased depressed mood, poor motivation, anxiety, decreased interest in activities, diminished pleasure in activities, lack of appetite, excessive worry, and fatigue since last session. Patient also reports increased worry about having another panic attack as she had one about 2 months ago. She expresses frustration with self as she was not able to enjoy the holidays as she has in the past. 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 She also reports she has not   been practicing relaxation techniques  consistently.        Patient last was seen  about 5-6  weeks ago. She reports stress related to coping with impact of coronavirus pandemic. 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 Her son and daughter-in-law as  well as two of her granddaughters work in Physicist, medical and also have worked with [place] patients. She has used her faith along with support from family and friends to cope. She denies having depressed mood but says she still is not as motivated or energetic to do things at times and has to push self. However, she reports feeling better and more energized once she gets started. She continues to socialize with family and reports she has been trying to walk for exercise 3-4 days per week. She reports absence of appetite and continues to struggle regarding eating patterns.  Therapist Response:  Reviewed symptoms, praised and reinforced patient's continued socialization, efforts to exercise,  and use of helpful coping skills, discussed ways to maintain behavioral activation, encouraged patient to continue efforts to improve eating patterns, also discussed talking with PCP regarding no appetite and discussing medication issues with Dr.  Ross  Plan: Return again in 2 weeks.            Diagnosis: Axis I: Major Depressive Disorder, Recurrent, Moderate    Axis II: No diagnosis    Alonza Smoker, LCSW 08/16/2018

## 2018-08-24 ENCOUNTER — Other Ambulatory Visit: Payer: Self-pay | Admitting: Family Medicine

## 2018-08-24 ENCOUNTER — Ambulatory Visit (HOSPITAL_COMMUNITY): Payer: BLUE CROSS/BLUE SHIELD | Admitting: Psychiatry

## 2018-08-31 ENCOUNTER — Ambulatory Visit (INDEPENDENT_AMBULATORY_CARE_PROVIDER_SITE_OTHER): Payer: BLUE CROSS/BLUE SHIELD | Admitting: Psychiatry

## 2018-08-31 ENCOUNTER — Other Ambulatory Visit: Payer: Self-pay

## 2018-08-31 DIAGNOSIS — F331 Major depressive disorder, recurrent, moderate: Secondary | ICD-10-CM

## 2018-08-31 NOTE — Progress Notes (Signed)
Virtual Visit via Video Note  I connected with Pamela Walters on 08/31/18 at 8:10 AM EDT by a video enabled telemedicine application and verified that I am speaking with the correct person using two identifiers.   I discussed the limitations of evaluation and management by telemedicine and the availability of in person appointments. The patient expressed understanding and agreed to proceed.   I provided minutes of non-face-to-face time during this encounter.   Alonza Smoker, LCSW                    THERAPIST PROGRESS NOTE          Session Time:    Wednesday 08/31/2018 8:10 AM-  8:55 AM    Participation Level: Active  Behavioral Response: Casual/ alert, talkative,                 Type of Therapy: Individual Therapy        Treatment Goals:                       1. Learn and implement coping and behavioral strategies to overcome depression.      2. Identify and replace thoughts and beliefs that support depression.                                 Treatment Goals addressed: 1,2  Interventions: Supportive,  CBT  Summary: Pamela Walters is a 58 y.o. female who is referred for services by PCP Dr. Moshe Cipro due to patient suffering symptoms of depression. Patient has been seen in the office interminttently for medication management and outpatient psychotherapy since 2016. She recently resumed services and was last seen 4 weeks ago. She reports increased depressed mood, poor motivation, anxiety, decreased interest in activities, diminished pleasure in activities, lack of appetite, excessive worry, and fatigue since last session. Patient also reports increased worry about having another panic attack as she had one about 2 months ago. She expresses frustration with self as she was not able to enjoy the holidays as she has in the past. 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 She also reports she has not   been practicing relaxation techniques consistently.         Patient last was seen by virtual visit via video about two weeks ago. She reports having down mood at times due to impact of coronavirus pandemic. She reports being stressed by the restrictions. She has coped by using her spirituality (prayer), meditation, journaling, and talking to family, coworkers. She says this has been helpful. She still reports not having the motivation and energy she wants but does engage in activities as well as continues to work. She has been exercising by walking daily about half mile. She also has experienced increased appetite,  improved her eating patterns,  and reports feeling better physcially. She remains medication compliant. She reports having some negative and anxious thoughts about her upcoming 58th birthday this month as she can't do things she sees other 13 year olds doing. She also worries about retiring at 73 as well as world issues that may affect her children and grandchildren. out 5-6  weeks ago.   Therapist Response:  Reviewed symptoms, praised and reinforced patient's continued socialization, efforts to exercise,  and use of helpful coping skills,  encouraged patient to continue efforts to improve eating patterns, assisted patient examine her  thought patterns and the ways she relates to her thoughts, assisted patient develop a plan to keep a daily gratitude list to assist patient cope with depressive thoughts about coping with the pandemic, began to review treatment plan   plan: Return again in 2 weeks.            Diagnosis: Axis I: Major Depressive Disorder, Recurrent, Moderate    Axis II: No diagnosis    Alonza Smoker, LCSW 08/31/2018

## 2018-09-01 ENCOUNTER — Ambulatory Visit (INDEPENDENT_AMBULATORY_CARE_PROVIDER_SITE_OTHER): Payer: BLUE CROSS/BLUE SHIELD | Admitting: Family Medicine

## 2018-09-01 ENCOUNTER — Encounter: Payer: Self-pay | Admitting: Family Medicine

## 2018-09-01 VITALS — BP 142/86 | Ht 63.0 in | Wt 166.0 lb

## 2018-09-01 DIAGNOSIS — F418 Other specified anxiety disorders: Secondary | ICD-10-CM

## 2018-09-01 DIAGNOSIS — G4733 Obstructive sleep apnea (adult) (pediatric): Secondary | ICD-10-CM

## 2018-09-01 DIAGNOSIS — E663 Overweight: Secondary | ICD-10-CM | POA: Diagnosis not present

## 2018-09-01 DIAGNOSIS — I10 Essential (primary) hypertension: Secondary | ICD-10-CM

## 2018-09-01 DIAGNOSIS — G35 Multiple sclerosis: Secondary | ICD-10-CM

## 2018-09-01 DIAGNOSIS — J3089 Other allergic rhinitis: Secondary | ICD-10-CM | POA: Diagnosis not present

## 2018-09-01 DIAGNOSIS — E785 Hyperlipidemia, unspecified: Secondary | ICD-10-CM | POA: Diagnosis not present

## 2018-09-01 DIAGNOSIS — Z1239 Encounter for other screening for malignant neoplasm of breast: Secondary | ICD-10-CM

## 2018-09-01 DIAGNOSIS — Z8669 Personal history of other diseases of the nervous system and sense organs: Secondary | ICD-10-CM

## 2018-09-01 DIAGNOSIS — N39498 Other specified urinary incontinence: Secondary | ICD-10-CM

## 2018-09-01 DIAGNOSIS — Z9989 Dependence on other enabling machines and devices: Secondary | ICD-10-CM

## 2018-09-01 DIAGNOSIS — E559 Vitamin D deficiency, unspecified: Secondary | ICD-10-CM

## 2018-09-01 MED ORDER — ROSUVASTATIN CALCIUM 40 MG PO TABS: 40.0000 mg | ORAL_TABLET | Freq: Every day | ORAL | 1 refills | Status: DC

## 2018-09-01 MED ORDER — EZETIMIBE 10 MG PO TABS: 10.0000 mg | ORAL_TABLET | Freq: Every day | ORAL | 1 refills | Status: DC

## 2018-09-01 MED ORDER — AMLODIPINE BESYLATE 2.5 MG PO TABS: 2.5000 mg | ORAL_TABLET | Freq: Every day | ORAL | 1 refills | Status: DC

## 2018-09-01 MED ORDER — MOMETASONE FUROATE 50 MCG/ACT NA SUSP
NASAL | 1 refills | Status: DC
Start: 2018-09-01 — End: 2022-08-10

## 2018-09-01 NOTE — Patient Instructions (Addendum)
F/U in early October, call if you need me before  Please schedule mammogram , due in May if possible, early am appt preferred  Please arrange for shingrix #1 , if can be done the day of her mammogram that is good for the patient, if not please give Shingrix # 1 appt in May  Please get fasting CBC, cmp and  eGFR, lipid, cBC, tSH and vit D  July 18 or after. Please sched  Shingrix # 2 round the same time as labs if possible , as we discussed  Thankful you are doing well overall  No medication changes at this time  Congrats on weight loss, keep it up!  Social distancing. Frequent hand washing with soap and water Keeping your hands off of your face.Wear a face mask and maintain 6 ft distance. These 3 practices will help to keep both you and your community healthy during this time. Please practice them faithfully!  Thanks for choosing [facility], we consider it a privelige to serve you.

## 2018-09-01 NOTE — Progress Notes (Signed)
Virtual Visit via Telephone Note  I connected with Pamela Walters on 09/01/18 at  2:20 PM EDT by telephone and verified that I am speaking with the correct person using two identifiers.  Location: Patient: home Provider: office   I discussed the limitations, risks, security and privacy concerns of performing an evaluation and management service by telephone and the availability of in person appointments. I also discussed with the patient that there may be a patient responsible charge related to this service. The patient expressed understanding and agreed to proceed.   History of Present Illness: F/u chronic problems , review of medication and any recent labs and  Consultations Update immunization and screening Denies recent fever or chills. Denies sinus pressure, nasal congestion, ear pain or sore throat. Denies chest congestion, productive cough or wheezing. Denies chest pains, palpitations and leg swelling Denies abdominal pain, nausea, vomiting,diarrhea or constipation.   Denies dysuria, frequency, hesitancy or incontinence. Denies uncontrolled  joint pain, swelling and limitation in mobility. Denies headaches, seizures, numbness, or tingling. Denies uncontrolled depression, anxiety or insomnia. Denies skin break down or rash.       Observations/Objective: BP (!) 142/86   Ht 5\' 3"  (1.6 m)   Wt 166 lb (75.3 kg)   BMI 29.41 kg/m  Good communication with no confusion and intact memory. Alert and oriented x 3 No signs of respiratory distress during sppech    Assessment and Plan: Essential hypertension  no change in medication will re evaluate at in office visit, anticipate that it is currently controlled  DASH diet and commitment to daily physical activity for a minimum of 30 minutes discussed and encouraged, as a part of hypertension management. The importance of attaining a healthy weight is also discussed.  BP/Weight 09/01/2018 03/07/2018 02/11/2018 01/17/2018  01/05/2018 11/18/2017 06/06/4707  Systolic BP 628 366 294 765 465 035 465  Diastolic BP 86 80 87 76 80 84 96  Wt. (Lbs) 166 174 173.5 177.8 169 171 174  BMI 29.41 30.82 30.73 31.5 29.94 30.29 30.82  Some encounter information is confidential and restricted. Go to Review Flowsheets activity to see all data.       Hyperlipidemia LDL goal <100 Hyperlipidemia:Low fat diet discussed and encouraged.   Lipid Panel  Lab Results  Component Value Date   CHOL 205 (H) 03/07/2018   HDL 36 (L) 03/07/2018   LDLCALC 141 (H) 03/07/2018   TRIG 149 03/07/2018   CHOLHDL 5.7 (H) 03/07/2018   Not at goal, needs to lower fat intake Updated lab needed at/ before next visit.     Overweight (BMI 25.0-29.9) Improved and she is congratulated on this Patient re-educated about  the importance of commitment to a  minimum of 150 minutes of exercise per week as able.  The importance of healthy food choices with portion control discussed, as well as eating regularly and within a 12 hour window most days. The need to choose "clean , green" food 50 to 75% of the time is discussed, as well as to make water the primary drink and set a goal of 64 ounces water daily.     Weight /BMI 09/01/2018 03/07/2018 02/11/2018  WEIGHT 166 lb 174 lb 173 lb 8 oz  HEIGHT 5\' 3"  5\' 3"  5\' 3"   BMI 29.41 kg/m2 30.82 kg/m2 30.73 kg/m2  Some encounter information is confidential and restricted. Go to Review Flowsheets activity to see all data.      OSA on CPAP Reports consistent use of cPAP  Depression with anxiety  Marked improvement since being treated by psychiatry , sees therapist regularly , which she finds very helpful  Urinary incontinence Controlled, no change in medication   History of multiple sclerosis Followed annually by neurology, most current dx in 2019, is POSSIBLE MS, though was told in the past that she has this dx    Follow Up Instructions:    I discussed the assessment and treatment plan with the  patient. The patient was provided an opportunity to ask questions and all were answered. The patient agreed with the plan and demonstrated an understanding of the instructions.   The patient was advised to call back or seek an in-person evaluation if the symptoms worsen or if the condition fails to improve as anticipated.  I provided 25 minutes of non-face-to-face time during this encounter.   [name], MD

## 2018-09-03 ENCOUNTER — Encounter: Payer: Self-pay | Admitting: Family Medicine

## 2018-09-03 NOTE — Assessment & Plan Note (Signed)
Followed annually by neurology, most current dx in 2019, is POSSIBLE MS, though was told in the past that she has this dx

## 2018-09-03 NOTE — Assessment & Plan Note (Signed)
Controlled, no change in medication  

## 2018-09-03 NOTE — Assessment & Plan Note (Signed)
Marked improvement since being treated by psychiatry , sees therapist regularly , which she finds very helpful

## 2018-09-03 NOTE — Assessment & Plan Note (Signed)
Hyperlipidemia:Low fat diet discussed and encouraged.   Lipid Panel  Lab Results  Component Value Date   CHOL 205 (H) 03/07/2018   HDL 36 (L) 03/07/2018   LDLCALC 141 (H) 03/07/2018   TRIG 149 03/07/2018   CHOLHDL 5.7 (H) 03/07/2018   Not at goal, needs to lower fat intake Updated lab needed at/ before next visit.

## 2018-09-03 NOTE — Assessment & Plan Note (Signed)
Reports consistent use of cPAP

## 2018-09-03 NOTE — Assessment & Plan Note (Signed)
Improved and she is congratulated on this Patient re-educated about  the importance of commitment to a  minimum of 150 minutes of exercise per week as able.  The importance of healthy food choices with portion control discussed, as well as eating regularly and within a 12 hour window most days. The need to choose "clean , green" food 50 to 75% of the time is discussed, as well as to make water the primary drink and set a goal of 64 ounces water daily.     Weight /BMI 09/01/2018 03/07/2018 02/11/2018  WEIGHT 166 lb 174 lb 173 lb 8 oz  HEIGHT 5\' 3"  5\' 3"  5\' 3"   BMI 29.41 kg/m2 30.82 kg/m2 30.73 kg/m2  Some encounter information is confidential and restricted. Go to Review Flowsheets activity to see all data.

## 2018-09-03 NOTE — Assessment & Plan Note (Signed)
no change in medication will re evaluate at in office visit, anticipate that it is currently controlled  DASH diet and commitment to daily physical activity for a minimum of 30 minutes discussed and encouraged, as a part of hypertension management. The importance of attaining a healthy weight is also discussed.  BP/Weight 09/01/2018 03/07/2018 02/11/2018 01/17/2018 01/05/2018 11/18/2017 0/25/8527  Systolic BP 782 423 536 144 315 400 867  Diastolic BP 86 80 87 76 80 84 96  Wt. (Lbs) 166 174 173.5 177.8 169 171 174  BMI 29.41 30.82 30.73 31.5 29.94 30.29 30.82  Some encounter information is confidential and restricted. Go to Review Flowsheets activity to see all data.

## 2018-09-14 ENCOUNTER — Ambulatory Visit (INDEPENDENT_AMBULATORY_CARE_PROVIDER_SITE_OTHER): Payer: BLUE CROSS/BLUE SHIELD | Admitting: Psychiatry

## 2018-09-14 ENCOUNTER — Other Ambulatory Visit: Payer: Self-pay

## 2018-09-14 DIAGNOSIS — F331 Major depressive disorder, recurrent, moderate: Secondary | ICD-10-CM | POA: Diagnosis not present

## 2018-09-14 NOTE — Progress Notes (Signed)
Virtual Visit via Video Note  I connected with [name] on 09/14/18 at  8:00 AM EDT by a video enabled telemedicine application and verified that I am speaking with the correct person using two identifiers.   I discussed the limitations of evaluation and management by telemedicine and the availability of in person appointments. The patient expressed understanding and agreed to proceed.    I provided 40  minutes of non-face-to-face time during this encounter.   Alonza Smoker, LCSW                       THERAPIST PROGRESS NOTE          Session Time:    Wednesday 09/14/2018 8:10 AM-  8:50 AM    Participation Level: Active  Behavioral Response: Casual/ alert, talkative,pleasant                 Type of Therapy: Individual Therapy        Treatment Goals:                       1. Learn and implement coping and behavioral strategies to overcome depression.      2. Identify and replace thoughts and beliefs that support depression.                                 Treatment Goals addressed: 1,2  Interventions: Supportive,  CBT  Summary: Pamela Walters is a 58 y.o. female who is referred for services by PCP Dr. Moshe Cipro due to patient suffering symptoms of depression. Patient has been seen in the office interminttently for medication management and outpatient psychotherapy since 2016. She recently resumed services and was last seen 4 weeks ago. She reports increased depressed mood, poor motivation, anxiety, decreased interest in activities, diminished pleasure in activities, lack of appetite, excessive worry, and fatigue since last session. Patient also reports increased worry about having another panic attack as she had one about 2 months ago. She expresses frustration with self as she was not able to enjoy the holidays as she has in the past. 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 She also reports she has not   been practicing relaxation  techniques consistently.        Patient last was seen by virtual visit via video about two weeks ago. She reports doing well since last session. She reports improved mood, increased appetite, increased energy,  and continued involvement in activity. She reports enjoying celebrating her 55th birthday with her family. She initially experienced anxiety provoking thoughts but chose to respond by relating to her thoughts differently and engaging in fun activity and socialization with family. She expresses  appropriate concern about impact of coronavirus pandemic but reports decreased excessive worry. Patient reports she has been keeping gratitude list daily and states this has helped her to look at things more deeply as well as caused her to have more positive thoughts and decreased negative thoughts. She is pleased with her progress.   Therapist Response:  Reviewed symptoms, praised and reinforced patient's continued socialization, efforts to exercise,  and use of helpful coping skills, praised and reinforced use of daily gratitude list, discussed effects, assigned patient to continue using list, praised and reinforced patient relating to thoughts differently and pursuing action consistent with her values, discussed effects, assigned patient to review handout on mindfulness  and the window of tolerance    plan: Return again in 2 weeks.            Diagnosis: Axis I: Major Depressive Disorder, Recurrent, Moderate    Axis II: No diagnosis    Alonza Smoker, LCSW 09/14/2018

## 2018-09-27 ENCOUNTER — Ambulatory Visit (INDEPENDENT_AMBULATORY_CARE_PROVIDER_SITE_OTHER): Payer: BC Managed Care – PPO | Admitting: Psychiatry

## 2018-09-27 ENCOUNTER — Encounter (HOSPITAL_COMMUNITY): Payer: Self-pay | Admitting: Psychiatry

## 2018-09-27 ENCOUNTER — Other Ambulatory Visit: Payer: Self-pay

## 2018-09-27 DIAGNOSIS — F331 Major depressive disorder, recurrent, moderate: Secondary | ICD-10-CM

## 2018-09-27 MED ORDER — DULOXETINE HCL 60 MG PO CPEP: 60.0000 mg | ORAL_CAPSULE | Freq: Every day | ORAL | 2 refills | Status: DC

## 2018-09-27 MED ORDER — CLONAZEPAM 0.5 MG PO TABS: 0.5000 mg | ORAL_TABLET | Freq: Every day | ORAL | 2 refills | Status: DC | PRN

## 2018-09-27 MED ORDER — DULOXETINE HCL 30 MG PO CPEP: 30.0000 mg | ORAL_CAPSULE | Freq: Every day | ORAL | 2 refills | Status: DC

## 2018-09-27 MED ORDER — PRAZOSIN HCL 1 MG PO CAPS: 1.0000 mg | ORAL_CAPSULE | Freq: Every day | ORAL | 2 refills | Status: DC

## 2018-09-27 NOTE — Progress Notes (Signed)
Virtual Visit via Video Note  I connected with Pamela Walters on 09/27/18 at  8:40 AM EDT by a video enabled telemedicine application and verified that I am speaking with the correct person using two identifiers.   I discussed the limitations of evaluation and management by telemedicine and the availability of in person appointments. The patient expressed understanding and agreed to proceed.      I discussed the assessment and treatment plan with the patient. The patient was provided an opportunity to ask questions and all were answered. The patient agreed with the plan and demonstrated an understanding of the instructions.   The patient was advised to call back or seek an in-person evaluation if the symptoms worsen or if the condition fails to improve as anticipated.  I provided 15 minutes of non-face-to-face time during this encounter.   [name], MD  [facility] MD/PA/NP OP Progress Note  09/27/2018 9:09 AM Pamela Walters  MRN:  [id number]  Chief Complaint:  Chief Complaint    Depression; Anxiety; Follow-up     HPI: This patient is a 58 year-old married white female who lives with her husband in [place]. 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  The patient returns for follow-up of treatment for depression and anxiety. She was last seen in August and was switched from Prozac to Cymbalta. She did not feel like the Prozac was helping that much anymore and she was having tonic pain. She states she had been taking the Cymbalta consistently because of her work schedule she kept forgetting. However back in November she was out with her family and had a severe panic attack and had to go to the hospital emergency room for workup. Nothing really showed up but this scared her. For the last couple of weeks she has been taking the Cymbalta every day and she is starting to feel better. She still has periods of feeling shaky and jittery even in her own home. She is  generally sleeping well. Her neurologist is still following her MS and she is going to have repeat brain scan in the next month. She occasionally has symptoms like numbness in her hands and feet and gets a bit off balance when she is tired. She is still able to work   The patient returns after 3 months.  She is seen via telemedicine due to the coronavirus pandemic.  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  She states that she is handled this fairly well had proper PPE and felt safe.  Nevertheless she has had to watch people die on ventilators etc. she is also had worries about her son and daughter who both work in healthcare at [employer] and have exposure to [place] patients as well.  So far everyone is done fine.  She states that her depression is under good control with the Cymbalta and she is not had any thoughts of self-harm.  Her anxiety is helped with the clonazepam.  She is no longer having nightmares as long as she takes the prazosin.  She feels like overall given the situation she is handled everything quite well. Visit Diagnosis:    ICD-10-CM   1. Major depressive disorder, recurrent episode, moderate (HCC) F33.1     Past Psychiatric History: none  Past Medical History:  Past Medical History:  Diagnosis Date  . Allergy   . Back pain   . Bell's  palsy 08/2017  . Depression   . GERD (gastroesophageal reflux disease)   . Hepatic steatosis   . Hiatal hernia   . Hyperlipidemia   . Hypertension   . Internal hemorrhoids   . MS (multiple sclerosis) ([place])    2007  . Multiple sclerosis ([place])   . Multiple sclerosis ([place]) 04/04/2008   Qualifier: Diagnosis of  By: Moshe Cipro MD, Joycelyn Schmid    . Schatzki's ring   . Sleep apnea    wears CPAP    Past Surgical History:  Procedure Laterality Date  . ABDOMINAL HYSTERECTOMY  2005   fibroids  . CARDIAC CATHETERIZATION N/A 05/04/2016   Procedure: Left Heart Cath and Coronary Angiography;   Surgeon: Jettie Booze, MD;  Location: [facility] [place] [facility];  Service: Cardiovascular;  Laterality: N/A;  . ESOPHAGOGASTRODUODENOSCOPY N/A 09/18/2013   Procedure: ESOPHAGOGASTRODUODENOSCOPY (EGD);  Surgeon: Jerene Bears, MD;  Location: [facility];  Service: Gastroenterology;  Laterality: N/A;  . UPPER GASTROINTESTINAL ENDOSCOPY    . WISDOM TOOTH EXTRACTION      Family Psychiatric History: see below  Family History:  Family History  Problem Relation Age of Onset  . Hypertension Mother   . Hyperlipidemia Mother   . Depression Mother   . Hypertension Father   . Colon cancer Neg Hx   . Esophageal cancer Neg Hx   . Rectal cancer Neg Hx   . Stomach cancer Neg Hx     Social History:  Social History   Socioeconomic History  . Marital status: Married    Spouse name: Not on file  . Number of children: Not on file  . Years of education: Not on file  . Highest education level: Not on file  Occupational History  . Not on file  Social Needs  . Financial resource strain: Not on file  . Food insecurity:    Worry: Not on file    Inability: Not on file  . Transportation needs:    Medical: Not on file    Non-medical: Not on file  Tobacco Use  . Smoking status: Never Smoker  . Smokeless tobacco: Never Used  Substance and Sexual Activity  . Alcohol use: No  . Drug use: No  . Sexual activity: Yes    Birth control/protection: Surgical  Lifestyle  . Physical activity:    Days per week: Not on file    Minutes per session: Not on file  . Stress: Not on file  Relationships  . Social connections:    Talks on phone: Not on file    Gets together: Not on file    Attends religious service: Not on file    Active member of club or organization: Not on file    Attends meetings of clubs or organizations: Not on file    Relationship status: Not on file  Other Topics Concern  . Not on file  Social History Narrative  . Not on file    Allergies: No Known Allergies  Metabolic  Disorder Labs: Lab Results  Component Value Date   HGBA1C 5.5 05/10/2017   MPG 111 05/10/2017   MPG 108 02/20/2016   No results found for: PROLACTIN Lab Results  Component Value Date   CHOL 205 (H) 03/07/2018   TRIG 149 03/07/2018   HDL 36 (L) 03/07/2018   CHOLHDL 5.7 (H) 03/07/2018   VLDL 34 (H) 06/30/2016   LDLCALC 141 (H) 03/07/2018   LDLCALC 39 10/13/2017   Lab Results  Component Value Date   TSH 2.36  05/10/2017   TSH 4.02 02/20/2016    Therapeutic Level Labs: No results found for: LITHIUM No results found for: VALPROATE No components found for:  CBMZ  Current Medications: Current Outpatient Medications  Medication Sig Dispense Refill  . acyclovir (ZOVIRAX) 400 MG tablet Take 1 tablet (400 mg total) by mouth 3 (three) times daily. 21 tablet 2  . amLODipine (NORVASC) 2.5 MG tablet Take 1 tablet (2.5 mg total) by mouth daily. 90 tablet 1  . Armodafinil 250 MG tablet TAKE 1 TABLET BY MOUTH  DAILY 30 tablet 5  . aspirin 81 MG tablet Take 81 mg by mouth daily.    . clonazePAM (KLONOPIN) 0.5 MG tablet Take 1 tablet (0.5 mg total) by mouth daily as needed for anxiety. 30 tablet 2  . DULoxetine (CYMBALTA) 30 MG capsule Take 1 capsule (30 mg total) by mouth daily. 30 capsule 2  . DULoxetine (CYMBALTA) 60 MG capsule Take 1 capsule (60 mg total) by mouth daily. 30 capsule 2  . ezetimibe (ZETIA) 10 MG tablet Take 1 tablet (10 mg total) by mouth daily. 90 tablet 1  . gabapentin (NEURONTIN) 600 MG tablet Take 1 tablet (600 mg total) by mouth 3 (three) times daily. 270 tablet 3  . metaxalone (SKELAXIN) 800 MG tablet Take 800 mg by mouth 2 (two) times daily as needed for muscle spasms.     . mometasone (NASONEX) 50 MCG/ACT nasal spray USE 2 SPRAYS NASALLY DAILY 51 g 1  . Multiple Vitamin (MULTIVITAMIN WITH MINERALS) TABS tablet Take 1 tablet by mouth daily.    Marland Kitchen oxybutynin (DITROPAN-XL) 10 MG 24 hr tablet TAKE 1 TABLET BY MOUTH AT  BEDTIME 90 tablet 3  . pantoprazole (PROTONIX) 40 MG  tablet Take 1 tablet (40 mg total) by mouth every morning. 90 tablet 0  . prazosin (MINIPRESS) 1 MG capsule Take 1 capsule (1 mg total) by mouth at bedtime. 30 capsule 2  . promethazine (PHENERGAN) 25 MG tablet Take 1 tablet (25 mg total) by mouth every 8 (eight) hours as needed for nausea. 8 tablet 0  . rosuvastatin (CRESTOR) 40 MG tablet Take 1 tablet (40 mg total) by mouth daily. 90 tablet 1  . traMADol (ULTRAM) 50 MG tablet TAKE 1 TO 2 TABLETS BY  MOUTH UP TO 4 PILLS DAILY  AS NEEDED , BUT NO MORE THAN 90 TABLETS PER MONTH 270 tablet 1  . triamterene-hydrochlorothiazide (MAXZIDE-25) 37.5-25 MG tablet TAKE 1 TABLET BY MOUTH  DAILY 90 tablet 1   No current facility-administered medications for this visit.    Facility-Administered Medications Ordered in Other Visits  Medication Dose Route Frequency Provider Last Rate Last Dose  . gadopentetate dimeglumine (MAGNEVIST) injection 20 mL  20 mL Intravenous Once PRN Sater, Nanine Means, MD         Musculoskeletal: Strength & Muscle Tone: within normal limits Gait & Station: normal Patient leans: N/A  Psychiatric Specialty Exam: Review of Systems  Neurological: Positive for tingling.  Psychiatric/Behavioral: The patient is nervous/anxious.   All other systems reviewed and are negative.   There were no vitals taken for this visit.There is no height or weight on file to calculate BMI.  General Appearance: Casual and Fairly Groomed  Eye Contact:  Good  Speech:  Clear and Coherent  Volume:  Normal  Mood:  Anxious and Euthymic  Affect:  Appropriate and Congruent  Thought Process:  Goal Directed  Orientation:  Full (Time, Place, and Person)  Thought Content: Rumination   Suicidal Thoughts:  No  Homicidal Thoughts:  No  Memory:  Immediate;   Good Recent;   Good Remote;   Good  Judgement:  Good  Insight:  Good  Psychomotor Activity:  Normal  Concentration:  Concentration: Good and Attention Span: Good  Recall:  Good  Fund of Knowledge:  Good  Language: Good  Akathisia:  No  Handed:  Right  AIMS (if indicated): not done  Assets:  Communication Skills Desire for Improvement Resilience Social Support Talents/Skills  ADL's:  Intact  Cognition: WNL  Sleep:  Good   Screenings: PHQ2-9     Counselor from 06/01/2018 in [facility] Office Visit from 03/07/2018 in [facility] Office Visit from 01/05/2018 in [facility] from 12/22/2017 in [facility] Office Visit from 11/10/2017 in [facility]  PHQ-2 Total Score  4  2  3  1   (Pended)   2  PHQ-9 Total Score  16  10  12   -  10       Assessment and Plan: This patient is a 58 year old female with a history of depression and anxiety.  She is doing well on her current regimen despite the recent stressors of working with palpitations.  She will continue Cymbalta 90 mg daily for depression, prazosin 1 mg at bedtime for nightmares and clonazepam 0.5 mg daily as needed for anxiety.  She will return to see me in 3 months   [name], MD 09/27/2018, 9:09 AM

## 2018-09-28 ENCOUNTER — Other Ambulatory Visit: Payer: Self-pay

## 2018-09-28 ENCOUNTER — Ambulatory Visit (INDEPENDENT_AMBULATORY_CARE_PROVIDER_SITE_OTHER): Payer: BC Managed Care – PPO | Admitting: Psychiatry

## 2018-09-28 DIAGNOSIS — F331 Major depressive disorder, recurrent, moderate: Secondary | ICD-10-CM

## 2018-09-28 NOTE — Progress Notes (Signed)
Virtual Visit via Video Note  I connected with Pamela Walters on 09/28/18 at 9:10 AM EDT by a video enabled telemedicine application and verified that I am speaking with the correct person using two identifiers.   I discussed the limitations of evaluation and management by telemedicine and the availability of in person appointments. The patient expressed understanding and agreed to proceed.    I provided  33 minutes of non-face-to-face time during this encounter.   Alonza Smoker, LCSW                       THERAPIST PROGRESS NOTE          Session Time:    Wednesday 09/28/2018 9:10 AM - 9:43 AM   Participation Level: Active  Behavioral Response: Casual/ alert, talkative,pleasant                 Type of Therapy: Individual Therapy        Treatment Goals:                       1. Learn and implement coping and behavioral strategies to overcome depression.      2. Identify and replace thoughts and beliefs that support depression.                                 Treatment Goals addressed: 1,2  Interventions: Supportive,  CBT  Summary: Pamela Walters is a 58 y.o. female who is referred for services by PCP Dr. Moshe Cipro due to patient suffering symptoms of depression. Patient has been seen in the office interminttently for medication management and outpatient psychotherapy since 2016. She recently resumed services and was last seen 4 weeks ago. She reports increased depressed mood, poor motivation, anxiety, decreased interest in activities, diminished pleasure in activities, lack of appetite, excessive worry, and fatigue since last session. Patient also reports increased worry about having another panic attack as she had one about 2 months ago. She expresses frustration with self as she was not able to enjoy the holidays as she has in the past. 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 She also reports she has not   been practicing relaxation  techniques consistently.        Patient last was seen by virtual visit via video about two weeks ago. She reports several stressors in the past two weeks including 68 yo granddaughter being diagnosed with a heart murmur and her son experiencing increased symptoms of depression. 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 Patient also hurt her back at work this past Sunday and is experiencing significant pain today. She also reports not feeling well due to sinus issues. However, she reports mood has been stable. She reports decreased appetite but continuing to maintain healthy eating patterns, keep daily gratitude list, walk regularly for exercise, and schedule time for self.   Therapist Response:  Reviewed symptoms, praised and reinforced patient's continued positive self-care, discussed stressors, facilitated expression of thoughts and feelings, validated feelings, praised and reinforced patient's increased awareness of her thoughts, feelings, and behaviors,  discussed possible resources for son including referral to [employer] in this practice,  assigned patient to review handout on mindfulness and the window of tolerance    plan: Return again in 2 weeks.  Diagnosis: Axis I: Major Depressive Disorder, Recurrent, Moderate    Axis II: No diagnosis    Alonza Smoker, LCSW 09/28/2018

## 2018-10-11 ENCOUNTER — Other Ambulatory Visit: Payer: Self-pay | Admitting: *Deleted

## 2018-10-11 MED ORDER — TRAMADOL HCL 50 MG PO TABS: ORAL_TABLET | ORAL | 1 refills | Status: DC

## 2018-10-12 ENCOUNTER — Ambulatory Visit (HOSPITAL_COMMUNITY): Payer: BC Managed Care – PPO | Admitting: Psychiatry

## 2018-10-12 ENCOUNTER — Other Ambulatory Visit: Payer: Self-pay

## 2018-10-13 ENCOUNTER — Other Ambulatory Visit: Payer: Self-pay

## 2018-10-13 DIAGNOSIS — I10 Essential (primary) hypertension: Secondary | ICD-10-CM

## 2018-10-13 MED ORDER — TRIAMTERENE-HCTZ 37.5-25 MG PO TABS: 1.0000 | ORAL_TABLET | Freq: Every day | ORAL | 1 refills | Status: DC

## 2018-10-20 ENCOUNTER — Ambulatory Visit (INDEPENDENT_AMBULATORY_CARE_PROVIDER_SITE_OTHER): Payer: BC Managed Care – PPO

## 2018-10-20 ENCOUNTER — Ambulatory Visit (HOSPITAL_COMMUNITY)
Admission: RE | Admit: 2018-10-20 | Discharge: 2018-10-20 | Disposition: A | Discharge status: Home / Self Care | Payer: BC Managed Care – PPO | Source: Ambulatory Visit | Attending: Family Medicine | Admitting: Family Medicine

## 2018-10-20 ENCOUNTER — Other Ambulatory Visit: Payer: Self-pay

## 2018-10-20 DIAGNOSIS — Z1239 Encounter for other screening for malignant neoplasm of breast: Secondary | ICD-10-CM

## 2018-10-20 DIAGNOSIS — Z23 Encounter for immunization: Secondary | ICD-10-CM | POA: Diagnosis not present

## 2018-10-20 DIAGNOSIS — Z1231 Encounter for screening mammogram for malignant neoplasm of breast: Secondary | ICD-10-CM | POA: Insufficient documentation

## 2018-10-20 NOTE — Progress Notes (Signed)
#  1 shingrix given. Scheduled to return in 8 weeks for #2

## 2018-10-26 ENCOUNTER — Ambulatory Visit (INDEPENDENT_AMBULATORY_CARE_PROVIDER_SITE_OTHER): Payer: BC Managed Care – PPO | Admitting: Psychiatry

## 2018-10-26 ENCOUNTER — Other Ambulatory Visit: Payer: Self-pay

## 2018-10-26 ENCOUNTER — Encounter (HOSPITAL_COMMUNITY): Payer: Self-pay | Admitting: Psychiatry

## 2018-10-26 DIAGNOSIS — F331 Major depressive disorder, recurrent, moderate: Secondary | ICD-10-CM

## 2018-10-26 NOTE — Progress Notes (Signed)
Virtual Visit via Video Note  I connected with Pamela Walters on 10/26/18 at  9:00 AM EDT by a video enabled telemedicine application and verified that I am speaking with the correct person using two identifiers.   I discussed the limitations of evaluation and management by telemedicine and the availability of in person appointments. The patient expressed understanding and agreed to proceed.    I provided 50 minutes of non-face-to-face time during this encounter.   Alonza Smoker, LCSW                       THERAPIST PROGRESS NOTE          Session Time:    Wednesday 10/26/2018 9:00 AM -  9:50 AM   Participation Level: Active  Behavioral Response: Casual/ alert, talkative,pleasant                 Type of Therapy: Individual Therapy        Treatment Goals:                       1. Learn and implement coping and behavioral strategies to overcome depression.      2. Identify and replace thoughts and beliefs that support depression.                                 Treatment Goals addressed: 1,2  Interventions: Supportive,  CBT  Summary: Pamela Walters is a 58 y.o. female who is referred for services by PCP Dr. Moshe Cipro due to patient suffering symptoms of depression. Patient has been seen in the office interminttently for medication management and outpatient psychotherapy since 2016. She recently resumed services and was last seen 4 weeks ago. She reports increased depressed mood, poor motivation, anxiety, decreased interest in activities, diminished pleasure in activities, lack of appetite, excessive worry, and fatigue since last session. Patient also reports increased worry about having another panic attack as she had one about 2 months ago. She expresses frustration with self as she was not able to enjoy the holidays as she has in the past. 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 She also reports she has not   been practicing relaxation  techniques consistently.        Patient last was seen by virtual visit via video about two weeks ago. She reports increased anxiety, decreased appetite, and  decreased motivation. She also reports beginning to neglect self-care regarding eating patterns and exercise. She reports increased stress related to grandson-in-law being exposed to coranavirus and becoming sick but later being diagnosed with strep. Patient was visiting him and her granddaughter at the time. When she returned home, she reports having constant worry she could have the virus but has been asymptomatic so far. She was tested earlier this week and is waiting for the results. Due to this incident, her son and his family have decided not to go on the traditional family vacation next week with patient and rest of the family. Patient is very disappointed and saddened by this although understanding of son's decision. 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  Patient reports she has continued using daily gratitude list and deep breathing.    Therapist Response:  Reviewed symptoms, praised and reinforced patient's continued  use of daily gratitude list and deep breathing, discussed stressors, facilitated expression of thoughts and feelings, validated feelings, assisted patient examine her  thoughts, feelings, and behaviors, assisted patient identify her values regarding family including connection and ways to pursue this without family being physically together for the traditional family vacation, reviewed the role of self-care and coping with stress and anxiety as well as depression, assisted patient develop plan to improve self-care regarding eating patterns and exercise (resume eating healthy and avoid skipping meals/walk 20 minutes/day 5 days/week), assigned patient to review benefits and consequence list daily    plan: Return again in 2  weeks.            Diagnosis: Axis I: Major Depressive Disorder, Recurrent, Moderate    Axis II: No diagnosis    Alonza Smoker, LCSW 10/26/2018

## 2018-11-05 ENCOUNTER — Other Ambulatory Visit: Payer: Self-pay | Admitting: Neurology

## 2018-11-09 ENCOUNTER — Ambulatory Visit (HOSPITAL_COMMUNITY): Payer: BC Managed Care – PPO | Admitting: Psychiatry

## 2018-11-23 ENCOUNTER — Other Ambulatory Visit: Payer: Self-pay

## 2018-11-23 ENCOUNTER — Ambulatory Visit (INDEPENDENT_AMBULATORY_CARE_PROVIDER_SITE_OTHER): Payer: BC Managed Care – PPO | Admitting: Psychiatry

## 2018-11-23 DIAGNOSIS — F331 Major depressive disorder, recurrent, moderate: Secondary | ICD-10-CM | POA: Diagnosis not present

## 2018-11-23 NOTE — Progress Notes (Signed)
Virtual Visit via Video Note  I connected with [name] on 11/23/18 at 9:10 AM EDT by a video enabled telemedicine application and verified that I am speaking with the correct person using two identifiers.   I discussed the limitations of evaluation and management by telemedicine and the availability of in person appointments. The patient expressed understanding and agreed to proceed.  I provided 40 minutes of non-face-to-face time during this encounter.   Alonza Smoker, LCSW                       THERAPIST PROGRESS NOTE          Session Time:    Wednesday 11/23/2018 9:10 AM - 9:50 AM   Participation Level: Active  Behavioral Response: Casual/ alert, talkative,pleasant                 Type of Therapy: Individual Therapy        Treatment Goals:                       1. Learn and implement coping and behavioral strategies to overcome depression.      2. Identify and replace thoughts and beliefs that support depression.                                 Treatment Goals addressed: 1,2  Interventions: Supportive,  CBT  Summary: CALEE NUGENT is a 58 y.o. female who is referred for services by PCP Dr. Moshe Cipro due to patient suffering symptoms of depression. Patient has been seen in the office interminttently for medication management and outpatient psychotherapy since 2016. She recently resumed services and was last seen 4 weeks ago. She reports increased depressed mood, poor motivation, anxiety, decreased interest in activities, diminished pleasure in activities, lack of appetite, excessive worry, and fatigue since last session. Patient also reports increased worry about having another panic attack as she had one about 2 months ago. She expresses frustration with self as she was not able to enjoy the holidays as she has in the past. 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 She also reports she has not   been practicing relaxation  techniques consistently.        Patient last was seen by virtual visit via video about 4 weeks ago. She reports increased rumination, fatigue, difficulty sleeping, and irritability. She states feeling herself slipping to where she was before. She reports enjoying her vacation but also being frustrated and  sad it was not the traditional vacation. She reports increased work stress due to acuity of cases as well as work load. She reports having little time to take a break due to staff shortage. She is becoming more anxious and has thoughts of making a mistake on job. She also has been working consecutive 12 hour days. She also reports continued difficulty improving self-care regarding eating patterns and exercise due to work demands and fatigue.  Suicidal/Homicidal: No  Therapist Response:  Reviewed symptoms, discussed stressors, facilitated expression of thoughts and feelings, validated feelings, did behavioral analysis with patient, assisted patient problem solve regarding pacing self on job and having time for self-care, developed plan with patient to avoid working consecutive 12-hour days, discussed rationale for and practiced a mindfulness activity using walking, assigned patient to implement this activity on her bathroom breaks in the morning and the afternoon,  also developed plan with patient to implement mindfulness activity using breath awareness in the mornings when she gets home from work   Plan: Return again in 2 weeks.            Diagnosis: Axis I: Major Depressive Disorder, Recurrent, Moderate    Axis II: No diagnosis    Alonza Smoker, LCSW 11/23/2018

## 2018-12-07 ENCOUNTER — Ambulatory Visit (INDEPENDENT_AMBULATORY_CARE_PROVIDER_SITE_OTHER): Payer: BC Managed Care – PPO | Admitting: Psychiatry

## 2018-12-07 ENCOUNTER — Other Ambulatory Visit: Payer: Self-pay

## 2018-12-07 DIAGNOSIS — F331 Major depressive disorder, recurrent, moderate: Secondary | ICD-10-CM

## 2018-12-07 NOTE — Progress Notes (Signed)
Virtual Visit via Video Note  I connected with Pamela Walters on 12/07/18 at  9:00 AM EDT by a video enabled telemedicine application and verified that I am speaking with the correct person using two identifiers.   I discussed the limitations of evaluation and management by telemedicine and the availability of in person appointments. The patient expressed understanding and agreed to proceed.  I provided 40 minutes of non-face-to-face time during this encounter.   Alonza Smoker, LCSW                       THERAPIST PROGRESS NOTE          Session Time:    Wednesday 12/07/2018 9:15 AM - 9:55 AM   Participation Level: Active  Behavioral Response: Casual/ alert, depressed,                Type of Therapy: Individual Therapy        Treatment Goals:                       1. Learn and implement coping and behavioral strategies to overcome depression.      2. Identify and replace thoughts and beliefs that support depression.                                 Treatment Goals addressed: 1,2  Interventions: Supportive,  CBT  Summary: Pamela Walters is a 58 y.o. female who is referred for services by PCP Dr. Moshe Cipro due to patient suffering symptoms of depression. Patient has been seen in the office interminttently for medication management and outpatient psychotherapy since 2016. She recently resumed services and was last seen 4 weeks ago. She reports increased depressed mood, poor motivation, anxiety, decreased interest in activities, diminished pleasure in activities, lack of appetite, excessive worry, and fatigue since last session. Patient also reports increased worry about having another panic attack as she had one about 2 months ago. She expresses frustration with self as she was not able to enjoy the holidays as she has in the past. 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 She also reports she has not   been practicing relaxation techniques  consistently.        Patient last was seen by virtual visit via video about 2 weeks ago. She reports continued fatigue, depressed mood, irritability, and poor motivation. She also reports increased negative thinking. She reports she has continued to work consecutive 12 hour days as she had already committed to a schedule for the past two weeks. She also reports thoughts of she should work extra hours because co-workers are doing this. She also reports wanting the financial bonus associated with the extra hours but says she really could do without it. She reports being too tired when she gets home from work to do anything. She reports neglecting self-care regarding eating and exercise. She has used the mindfulness activity using walking at work and reports this has helped pace self while at work. She reports she has not been practicing mindfulness activity using breath awareness in the mornings as she is too tired.  Suicidal/Homicidal: No  Therapist Response:  Reviewed symptoms, praised and reinforced patient's use of mindfulness activity using walking, discussed effects, discussed stressors, facilitated expression of thoughts and feelings, validated feelings, did behavioral analysis with patient regarding setting limits regarding work schedule, assisted  patient identify/challenge/and replace unhelpful thoughts that have inhibited setting limits regarding her work schedule with more helpful thoughts, assisted patient develop list of benefits for implementing planned work schedule and consequences of not implementing planned work schedule, assigned patient to implement schedule developed in session, developed plan with patient to continue using mindfulness activity using walking and implement mindfulness activity using breath awareness in the mornings when she gets home from work   Plan: Return again in 2 weeks.            Diagnosis: Axis I: Major Depressive Disorder, Recurrent, Moderate    Axis II: No  diagnosis    Alonza Smoker, LCSW 12/07/2018

## 2018-12-21 ENCOUNTER — Ambulatory Visit (INDEPENDENT_AMBULATORY_CARE_PROVIDER_SITE_OTHER): Payer: BC Managed Care – PPO | Admitting: Psychiatry

## 2018-12-21 ENCOUNTER — Other Ambulatory Visit: Payer: Self-pay

## 2018-12-21 DIAGNOSIS — F331 Major depressive disorder, recurrent, moderate: Secondary | ICD-10-CM | POA: Diagnosis not present

## 2018-12-21 NOTE — Progress Notes (Signed)
Virtual Visit via Video Note  I connected with Pamela Walters on 12/21/18 at 9:10 AM EDT by a video enabled telemedicine application and verified that I am speaking with the correct person using two identifiers.   I discussed the limitations of evaluation and management by telemedicine and the availability of in person appointments. The patient expressed understanding and agreed to proceed.  I provided 40 minutes of non-face-to-face time during this encounter.   Alonza Smoker, LCSW                         THERAPIST PROGRESS NOTE          Session Time:    Wednesday 12/21/2018  9:10 AM - 9:50 AM   Participation Level: Active  Behavioral Response: Casual/ alert, euthymic             Type of Therapy: Individual Therapy        Treatment Goals:                       1. Learn and implement coping and behavioral strategies to overcome depression.      2. Identify and replace thoughts and beliefs that support depression.                                 Treatment Goals addressed: 1,2  Interventions: Supportive,  CBT  Summary: Pamela Walters is a 58 y.o. female who is referred for services by PCP Dr. Moshe Cipro due to patient suffering symptoms of depression. Patient has been seen in the office interminttently for medication management and outpatient psychotherapy since 2016. She recently resumed services and was last seen 4 weeks ago. She reports increased depressed mood, poor motivation, anxiety, decreased interest in activities, diminished pleasure in activities, lack of appetite, excessive worry, and fatigue since last session. Patient also reports increased worry about having another panic attack as she had one about 2 months ago. She expresses frustration with self as she was not able to enjoy the holidays as she has in the past. 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 She also reports she has not   been practicing relaxation techniques  consistently.        Patient last was seen by virtual visit via video about 2 weeks ago. She reports feeling much better. She reports increased stress regarding work but is managing well. She took time off work and rested.  Patient reports this helped tremendously.  She also continues to use mindfulness activity using walking while at work and says this is helpful as well.  She reports reduced negative thinking patterns and states now thinking about things more rationally.  She is setting limits regarding her work schedule.  She reports reviewing list of benefits and consequences developed in last session and says this has been helpful.  She is planning to visit grandchildren at the beach this weekend and is very excited about this.   Suicidal/Homicidal: No  Therapist Response:  Reviewed symptoms, praised and reinforced patient taking time off work and resting/setting limits regarding work schedule, discussed effects, assisted patient identify ways to maintain consistent efforts, reviewed rationale for using mindfulness, assigned patient to continue practicing mindfulness activity daily/continue to set limits regarding work schedule/continue to review benefits and consequence list   Plan: Return again in 2 weeks.  Diagnosis: Axis I: Major Depressive Disorder, Recurrent, Moderate    Axis II: No diagnosis    Alonza Smoker, LCSW 12/21/2018

## 2018-12-22 ENCOUNTER — Ambulatory Visit: Payer: BC Managed Care – PPO

## 2018-12-28 ENCOUNTER — Ambulatory Visit (INDEPENDENT_AMBULATORY_CARE_PROVIDER_SITE_OTHER): Payer: BC Managed Care – PPO | Admitting: Psychiatry

## 2018-12-28 ENCOUNTER — Encounter (HOSPITAL_COMMUNITY): Payer: Self-pay | Admitting: Psychiatry

## 2018-12-28 ENCOUNTER — Other Ambulatory Visit: Payer: Self-pay

## 2018-12-28 DIAGNOSIS — F331 Major depressive disorder, recurrent, moderate: Secondary | ICD-10-CM | POA: Diagnosis not present

## 2018-12-28 MED ORDER — CLONAZEPAM 0.5 MG PO TABS: 0.5000 mg | ORAL_TABLET | Freq: Every day | ORAL | 2 refills | Status: DC | PRN

## 2018-12-28 MED ORDER — PRAZOSIN HCL 1 MG PO CAPS: 1.0000 mg | ORAL_CAPSULE | Freq: Every day | ORAL | 2 refills | Status: DC

## 2018-12-28 MED ORDER — DULOXETINE HCL 30 MG PO CPEP: 30.0000 mg | ORAL_CAPSULE | Freq: Every day | ORAL | 2 refills | Status: DC

## 2018-12-28 MED ORDER — DULOXETINE HCL 60 MG PO CPEP: 60.0000 mg | ORAL_CAPSULE | Freq: Every day | ORAL | 2 refills | Status: DC

## 2018-12-28 NOTE — Progress Notes (Signed)
Virtual Visit via Video Note  I connected with Pamela Walters on 12/28/18 at  9:20 AM EDT by a video enabled telemedicine application and verified that I am speaking with the correct person using two identifiers.   I discussed the limitations of evaluation and management by telemedicine and the availability of in person appointments. The patient expressed understanding and agreed to proceed.     I discussed the assessment and treatment plan with the patient. The patient was provided an opportunity to ask questions and all were answered. The patient agreed with the plan and demonstrated an understanding of the instructions.   The patient was advised to call back or seek an in-person evaluation if the symptoms worsen or if the condition fails to improve as anticipated.  I provided 15 minutes of non-face-to-face time during this encounter.   [name], MD  [facility] MD/PA/NP OP Progress Note  12/28/2018 9:50 AM Pamela Walters  MRN:  [id number]  Chief Complaint:  Chief Complaint    Depression; Anxiety; Follow-up     HPI: This patient is a 58 year old married white female who lives with her husband in [place].  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  The patient returns after 3 months for follow-up regarding depression and anxiety.  She states that overall she is doing pretty well.  She is taken a couple of vacations and spent some time just resting.  She is not had any further panic attacks.  Her mood is generally been stable.  She is sleeping well at night and no longer having the nightmares.  She thinks her medications are at a good level at this point.  Her energy is fairly good but she knows when she needs to take time off to rest. Visit Diagnosis:    ICD-10-CM   1. Major depressive disorder, recurrent episode, moderate (HCC)  F33.1     Past Psychiatric History: none  Past Medical History:  Past Medical History:  Diagnosis Date  . Allergy   . Back  pain   . Bell's palsy 08/2017  . Depression   . GERD (gastroesophageal reflux disease)   . Hepatic steatosis   . Hiatal hernia   . Hyperlipidemia   . Hypertension   . Internal hemorrhoids   . MS (multiple sclerosis) ([place])    2007  . Multiple sclerosis ([place])   . Multiple sclerosis ([place]) 04/04/2008   Qualifier: Diagnosis of  By: Moshe Cipro MD, Joycelyn Schmid    . Schatzki's ring   . Sleep apnea    wears CPAP    Past Surgical History:  Procedure Laterality Date  . ABDOMINAL HYSTERECTOMY  2005   fibroids  . CARDIAC CATHETERIZATION N/A 05/04/2016   Procedure: Left Heart Cath and Coronary Angiography;  Surgeon: Jettie Booze, MD;  Location: [facility];  Service: Cardiovascular;  Laterality: N/A;  . ESOPHAGOGASTRODUODENOSCOPY N/A 09/18/2013   Procedure: ESOPHAGOGASTRODUODENOSCOPY (EGD);  Surgeon: Jerene Bears, MD;  Location: [facility];  Service: Gastroenterology;  Laterality: N/A;  . UPPER GASTROINTESTINAL ENDOSCOPY    . WISDOM TOOTH EXTRACTION      Family Psychiatric History: see below  Family History:  Family History  Problem Relation Age of Onset  . Hypertension Mother   . Hyperlipidemia Mother   . Depression Mother   . Hypertension Father   . Colon cancer Neg Hx   . Esophageal cancer Neg Hx   . Rectal cancer Neg Hx   . Stomach cancer Neg  Hx     Social History:  Social History   Socioeconomic History  . Marital status: Married    Spouse name: Not on file  . Number of children: Not on file  . Years of education: Not on file  . Highest education level: Not on file  Occupational History  . Not on file  Social Needs  . Financial resource strain: Not on file  . Food insecurity    Worry: Not on file    Inability: Not on file  . Transportation needs    Medical: Not on file    Non-medical: Not on file  Tobacco Use  . Smoking status: Never Smoker  . Smokeless tobacco: Never Used  Substance and Sexual Activity  . Alcohol use: No  . Drug use: No  . Sexual  activity: Yes    Birth control/protection: Surgical  Lifestyle  . Physical activity    Days per week: Not on file    Minutes per session: Not on file  . Stress: Not on file  Relationships  . Social Herbalist on phone: Not on file    Gets together: Not on file    Attends religious service: Not on file    Active member of club or organization: Not on file    Attends meetings of clubs or organizations: Not on file    Relationship status: Not on file  Other Topics Concern  . Not on file  Social History Narrative  . Not on file    Allergies: No Known Allergies  Metabolic Disorder Labs: Lab Results  Component Value Date   HGBA1C 5.5 05/10/2017   MPG 111 05/10/2017   MPG 108 02/20/2016   No results found for: PROLACTIN Lab Results  Component Value Date   CHOL 205 (H) 03/07/2018   TRIG 149 03/07/2018   HDL 36 (L) 03/07/2018   CHOLHDL 5.7 (H) 03/07/2018   VLDL 34 (H) 06/30/2016   LDLCALC 141 (H) 03/07/2018   LDLCALC 39 10/13/2017   Lab Results  Component Value Date   TSH 2.36 05/10/2017   TSH 4.02 02/20/2016    Therapeutic Level Labs: No results found for: LITHIUM No results found for: VALPROATE No components found for:  CBMZ  Current Medications: Current Outpatient Medications  Medication Sig Dispense Refill  . acyclovir (ZOVIRAX) 400 MG tablet Take 1 tablet (400 mg total) by mouth 3 (three) times daily. (Patient not taking: Reported on 10/26/2018) 21 tablet 2  . amLODipine (NORVASC) 2.5 MG tablet Take 1 tablet (2.5 mg total) by mouth daily. 90 tablet 1  . Armodafinil 250 MG tablet TAKE 1 TABLET BY MOUTH  DAILY 30 tablet 5  . aspirin 81 MG tablet Take 81 mg by mouth daily.    . clonazePAM (KLONOPIN) 0.5 MG tablet Take 1 tablet (0.5 mg total) by mouth daily as needed for anxiety. 30 tablet 2  . DULoxetine (CYMBALTA) 30 MG capsule Take 1 capsule (30 mg total) by mouth daily. 30 capsule 2  . DULoxetine (CYMBALTA) 60 MG capsule Take 1 capsule (60 mg total) by  mouth daily. 30 capsule 2  . ezetimibe (ZETIA) 10 MG tablet Take 1 tablet (10 mg total) by mouth daily. 90 tablet 1  . gabapentin (NEURONTIN) 600 MG tablet Take 1 tablet (600 mg total) by mouth 3 (three) times daily. 270 tablet 3  . metaxalone (SKELAXIN) 800 MG tablet Take 800 mg by mouth 2 (two) times daily as needed for muscle spasms.     Marland Kitchen  mometasone (NASONEX) 50 MCG/ACT nasal spray USE 2 SPRAYS NASALLY DAILY 51 g 1  . Multiple Vitamin (MULTIVITAMIN WITH MINERALS) TABS tablet Take 1 tablet by mouth daily.    Marland Kitchen oxybutynin (DITROPAN-XL) 10 MG 24 hr tablet TAKE 1 TABLET BY MOUTH AT  BEDTIME 90 tablet 3  . pantoprazole (PROTONIX) 40 MG tablet Take 1 tablet (40 mg total) by mouth every morning. 90 tablet 0  . prazosin (MINIPRESS) 1 MG capsule Take 1 capsule (1 mg total) by mouth at bedtime. 30 capsule 2  . promethazine (PHENERGAN) 25 MG tablet Take 1 tablet (25 mg total) by mouth every 8 (eight) hours as needed for nausea. 8 tablet 0  . rosuvastatin (CRESTOR) 40 MG tablet Take 1 tablet (40 mg total) by mouth daily. 90 tablet 1  . traMADol (ULTRAM) 50 MG tablet TAKE 1 TO 2 TABLETS BY  MOUTH UP TO 4 PILLS DAILY  AS NEEDED , BUT NO MORE THAN 90 TABLETS PER MONTH 270 tablet 1  . triamterene-hydrochlorothiazide (MAXZIDE-25) 37.5-25 MG tablet Take 1 tablet by mouth daily. 90 tablet 1   No current facility-administered medications for this visit.    Facility-Administered Medications Ordered in Other Visits  Medication Dose Route Frequency Provider Last Rate Last Dose  . gadopentetate dimeglumine (MAGNEVIST) injection 20 mL  20 mL Intravenous Once PRN Sater, Nanine Means, MD         Musculoskeletal: Strength & Muscle Tone: within normal limits Gait & Station: normal Patient leans: N/A  Psychiatric Specialty Exam: Review of Systems  Musculoskeletal: Positive for back pain.  All other systems reviewed and are negative.   There were no vitals taken for this visit.There is no height or weight on file  to calculate BMI.  General Appearance: Casual, Neat and Well Groomed  Eye Contact:  Good  Speech:  Clear and Coherent  Volume:  Normal  Mood:  Euthymic  Affect:  Appropriate and Congruent  Thought Process:  Goal Directed  Orientation:  Full (Time, Place, and Person)  Thought Content: WDL   Suicidal Thoughts:  No  Homicidal Thoughts:  No  Memory:  Immediate;   Good Recent;   Good Remote;   Good  Judgement:  Good  Insight:  Good  Psychomotor Activity:  Normal  Concentration:  Concentration: Good and Attention Span: Good  Recall:  Good  Fund of Knowledge: Good  Language: Good  Akathisia:  No  Handed:  Right  AIMS (if indicated): not done  Assets:  Communication Skills Desire for Improvement Resilience Social Support Talents/Skills Vocational/Educational  ADL's:  Intact  Cognition: WNL  Sleep:  Good   Screenings: PHQ2-9     Counselor from 06/01/2018 in [facility] Visit from 03/07/2018 in [facility] Office Visit from 01/05/2018 in [facility] from 12/22/2017 in [facility] Office Visit from 11/10/2017 in [facility]  PHQ-2 Total Score  4  2  3  1   (Pended)   2  PHQ-9 Total Score  16  10  12   -  10       Assessment and Plan: This patient is a 58 year old female with a history of depression and anxiety.  She continues to do well on her current regimen along with her therapy.  She will continue Cymbalta 90 mg daily for depression, prazosin 1 mg at bedtime for nightmares and clonazepam 0.5 mg daily as needed for anxiety.  She will return to see me in 3 months  [name], MD 12/28/2018, 9:50 AM

## 2019-01-04 ENCOUNTER — Ambulatory Visit (HOSPITAL_COMMUNITY): Payer: BC Managed Care – PPO | Admitting: Psychiatry

## 2019-01-18 ENCOUNTER — Ambulatory Visit (HOSPITAL_COMMUNITY): Payer: BC Managed Care – PPO | Admitting: Psychiatry

## 2019-01-18 ENCOUNTER — Other Ambulatory Visit: Payer: Self-pay

## 2019-01-30 ENCOUNTER — Other Ambulatory Visit: Payer: Self-pay

## 2019-01-30 ENCOUNTER — Encounter: Payer: Self-pay | Admitting: Family Medicine

## 2019-01-30 ENCOUNTER — Ambulatory Visit: Payer: BC Managed Care – PPO | Admitting: Family Medicine

## 2019-01-30 VITALS — BP 130/82 | HR 87 | Temp 97.4°F | Resp 16 | Ht 63.0 in | Wt 167.0 lb

## 2019-01-30 DIAGNOSIS — E785 Hyperlipidemia, unspecified: Secondary | ICD-10-CM

## 2019-01-30 DIAGNOSIS — M25562 Pain in left knee: Secondary | ICD-10-CM

## 2019-01-30 DIAGNOSIS — Z23 Encounter for immunization: Secondary | ICD-10-CM | POA: Diagnosis not present

## 2019-01-30 DIAGNOSIS — I10 Essential (primary) hypertension: Secondary | ICD-10-CM | POA: Diagnosis not present

## 2019-01-30 DIAGNOSIS — R1013 Epigastric pain: Secondary | ICD-10-CM

## 2019-01-30 DIAGNOSIS — F418 Other specified anxiety disorders: Secondary | ICD-10-CM

## 2019-01-30 DIAGNOSIS — M25561 Pain in right knee: Secondary | ICD-10-CM

## 2019-01-30 DIAGNOSIS — M25569 Pain in unspecified knee: Secondary | ICD-10-CM | POA: Insufficient documentation

## 2019-01-30 DIAGNOSIS — M549 Dorsalgia, unspecified: Secondary | ICD-10-CM | POA: Diagnosis not present

## 2019-01-30 DIAGNOSIS — E559 Vitamin D deficiency, unspecified: Secondary | ICD-10-CM | POA: Diagnosis not present

## 2019-01-30 MED ORDER — METAXALONE 800 MG PO TABS: 800.0000 mg | ORAL_TABLET | Freq: Three times a day (TID) | ORAL | 3 refills | Status: DC

## 2019-01-30 NOTE — Assessment & Plan Note (Signed)
Reports p progressively disabling bilateral knee pain, wants o Ortho eval, reports buckling

## 2019-01-30 NOTE — Assessment & Plan Note (Signed)
Controlled, no change in medication  

## 2019-01-30 NOTE — Assessment & Plan Note (Signed)
Message to treating Psych, so they are aware to adjust medication Counseled for approx 10 minutes, recommend spiritual growth

## 2019-01-30 NOTE — Assessment & Plan Note (Signed)
Hyperlipidemia:Low fat diet discussed and encouraged.   Lipid Panel  Lab Results  Component Value Date   CHOL 205 (H) 03/07/2018   HDL 36 (L) 03/07/2018   LDLCALC 141 (H) 03/07/2018   TRIG 149 03/07/2018   CHOLHDL 5.7 (H) 03/07/2018   Uncontrolled, and updated lab is past due

## 2019-01-30 NOTE — Progress Notes (Signed)
   ASHTYN HATHWAY     MRN: [id number]      DOB: [birth year]   HPI Ms. Bala is here for follow up and re-evaluation of chronic medical conditions, medication management and review of any available recent lab and radiology data.  Preventive health is updated, specifically  Cancer screening and Immunization.   Questions or concerns regarding consultations or procedures which the PT has had in the interim are  addressed. The PT denies any adverse reactions to current medications since the last visit.  C/o never feeling well Continued uncontrolled back pain and spasm C/o inadequately treated depression, not suicidal or homicidal, feels as  Tho she has little purpose in life, considering disability due to chronic back and knee pain  ROS Denies recent fever or chills. Denies sinus pressure, nasal congestion, ear pain or sore throat. Denies chest congestion, productive cough or wheezing. Denies chest pains, palpitations and leg swelling Denies abdominal pain, nausea, vomiting,diarrhea or constipation.   Denies dysuria, frequency, hesitancy or incontinence.  Denies headaches, seizures, numbness, or tingling.  Denies skin break down or rash.   PE  BP 130/82   Pulse 87   Temp (!) 97.4 F (36.3 C) (Temporal)   Resp 16   Ht 5\' 3"  (1.6 m)   Wt 167 lb (75.8 kg)   SpO2 97%   BMI 29.58 kg/m   Patient alert and oriented and in no cardiopulmonary distress.  HEENT: No facial asymmetry, EOMI,   .  Neck supple no JVD, no mass.  Chest: Clear to auscultation bilaterally.  CVS: S1, S2 no murmurs, no S3.Regular rate.  ABD: Soft non tender.   Ext: No edema  MS: decreased ROM lumbar  spine, adequate in shoulders, hips and reduced in knees.  Skin: Intact, no ulcerations or rash noted.  Psych: Good eye contact, normal affect. Memory intact  anxious and  depressed appearing.  CNS: CN 2-12 intact, power,  normal throughout.no focal deficits noted.   Assessment & Plan  Depression with  anxiety Message to treating Psych, so they are aware to adjust medication Counseled for approx 10 minutes, recommend spiritual growth  Back pain with radiation Radiates to right calf, uncontrolled with spasm Skelaxin dose increased if able to tolerate and work  Hyperlipidemia LDL goal <100 Hyperlipidemia:Low fat diet discussed and encouraged.   Lipid Panel  Lab Results  Component Value Date   CHOL 205 (H) 03/07/2018   HDL 36 (L) 03/07/2018   LDLCALC 141 (H) 03/07/2018   TRIG 149 03/07/2018   CHOLHDL 5.7 (H) 03/07/2018   Uncontrolled, and updated lab is past due    Dyspepsia Controlled, no change in medication   Knee pain Reports p progressively disabling bilateral knee pain, wants o Ortho eval, reports buckling

## 2019-01-30 NOTE — Patient Instructions (Signed)
F/U in 4. 5 months, call if you need me before  Please get shingrix  # 2 today , may get flu vaccine in next 2 weeks  Labs today  You are referred to Ortho of your choice (Dr. Rolena Infante office)Novemebr  Increase dose of skelaxin to 3 times daily   I will message Dr Harrington Challenger about your mental health

## 2019-01-30 NOTE — Assessment & Plan Note (Signed)
Radiates to right calf, uncontrolled with spasm Skelaxin dose increased if able to tolerate and work

## 2019-01-31 ENCOUNTER — Encounter: Payer: Self-pay | Admitting: Family Medicine

## 2019-01-31 LAB — COMPLETE METABOLIC PANEL WITH GFR
AG Ratio: 1.7 (calc) (ref 1.0–2.5)
ALT: 11 U/L (ref 6–29)
AST: 24 U/L (ref 10–35)
Albumin: 4.3 g/dL (ref 3.6–5.1)
Alkaline phosphatase (APISO): 67 U/L (ref 37–153)
BUN: 16 mg/dL (ref 7–25)
CO2: 28 mmol/L (ref 20–32)
Calcium: 9.4 mg/dL (ref 8.6–10.4)
Chloride: 96 mmol/L — ABNORMAL LOW (ref 98–110)
Creat: 0.92 mg/dL (ref 0.50–0.99)
GFR, Est African American: 78 mL/min/{1.73_m2} (ref >=60)
GFR, Est Non African American: 68 mL/min/{1.73_m2} (ref >=60)
Globulin: 2.6 g/dL (calc) (ref 1.9–3.7)
Glucose, Bld: 85 mg/dL (ref 65–99)
Potassium: 3.8 mmol/L (ref 3.5–5.3)
Sodium: 136 mmol/L (ref 135–146)
Total Bilirubin: 0.8 mg/dL (ref 0.2–1.2)
Total Protein: 6.9 g/dL (ref 6.1–8.1)

## 2019-01-31 LAB — CBC
HCT: 34.3 % — ABNORMAL LOW (ref 35.0–45.0)
Hemoglobin: 11.1 g/dL — ABNORMAL LOW (ref 11.7–15.5)
MCH: 26.2 pg — ABNORMAL LOW (ref 27.0–33.0)
MCHC: 32.4 g/dL (ref 32.0–36.0)
MCV: 80.9 fL (ref 80.0–100.0)
MPV: 9.2 fL (ref 7.5–12.5)
Platelets: 330 10*3/uL (ref 140–400)
RBC: 4.24 10*6/uL (ref 3.80–5.10)
RDW: 15.5 % — ABNORMAL HIGH (ref 11.0–15.0)
WBC: 9.4 10*3/uL (ref 3.8–10.8)

## 2019-01-31 LAB — LIPID PANEL
Cholesterol: 118 mg/dL (ref <200)
HDL: 54 mg/dL (ref >=50)
LDL Cholesterol (Calc): 45 mg/dL (calc)
Non-HDL Cholesterol (Calc): 64 mg/dL (calc) (ref <130)
Total CHOL/HDL Ratio: 2.2 (calc) (ref <5.0)
Triglycerides: 100 mg/dL (ref <150)

## 2019-01-31 LAB — VITAMIN D 25 HYDROXY (VIT D DEFICIENCY, FRACTURES): Vit D, 25-Hydroxy: 28 ng/mL — ABNORMAL LOW (ref 30–100)

## 2019-01-31 LAB — TSH: TSH: 2.36 mIU/L (ref 0.40–4.50)

## 2019-02-03 ENCOUNTER — Other Ambulatory Visit: Payer: Self-pay | Admitting: Family Medicine

## 2019-02-03 MED ORDER — MIRABEGRON ER 25 MG PO TB24: 25.0000 mg | ORAL_TABLET | Freq: Every day | ORAL | 3 refills | Status: DC

## 2019-02-13 ENCOUNTER — Encounter: Payer: Self-pay | Admitting: Diagnostic Neuroimaging

## 2019-02-13 ENCOUNTER — Ambulatory Visit: Payer: BLUE CROSS/BLUE SHIELD | Admitting: Neurology

## 2019-02-13 ENCOUNTER — Encounter: Payer: Self-pay | Admitting: Neurology

## 2019-02-14 ENCOUNTER — Encounter: Payer: Self-pay | Admitting: Neurology

## 2019-02-24 ENCOUNTER — Ambulatory Visit (HOSPITAL_COMMUNITY): Payer: BC Managed Care – PPO | Admitting: Psychiatry

## 2019-02-26 ENCOUNTER — Other Ambulatory Visit: Payer: Self-pay | Admitting: Neurology

## 2019-02-26 ENCOUNTER — Other Ambulatory Visit: Payer: Self-pay | Admitting: Family Medicine

## 2019-03-01 ENCOUNTER — Ambulatory Visit (INDEPENDENT_AMBULATORY_CARE_PROVIDER_SITE_OTHER): Payer: BC Managed Care – PPO | Admitting: Psychiatry

## 2019-03-01 ENCOUNTER — Other Ambulatory Visit: Payer: Self-pay

## 2019-03-01 DIAGNOSIS — F331 Major depressive disorder, recurrent, moderate: Secondary | ICD-10-CM | POA: Diagnosis not present

## 2019-03-01 NOTE — Progress Notes (Signed)
Virtual Visit via Video Note  I connected with Pamela Walters on 03/01/19 at 9:15 AM EST by a video enabled telemedicine application and verified that I am speaking with the correct person using two identifiers.   I discussed the limitations of evaluation and management by telemedicine and the availability of in person appointments. The patient expressed understanding and agreed to proceed.   I provided 45  minutes of non-face-to-face time during this encounter.   Alonza Smoker, LCSW                     THERAPIST PROGRESS NOTE          Session Time:    Wednesday 03/01/2019 9:15 AM - 10:00 AM   Participation Level: Active  Behavioral Response: Casual/ alert, euthymic             Type of Therapy: Individual Therapy        Treatment Goals:                       1. Learn and implement coping and behavioral strategies to overcome depression.      2. Identify and replace thoughts and beliefs that support depression.                                 Treatment Goals addressed: 1,2  Interventions: Supportive,  CBT  Summary: Pamela Walters is a 58 y.o. female who is referred for services by PCP Dr. Moshe Cipro due to patient suffering symptoms of depression. Patient has been seen in the office interminttently for medication management and outpatient psychotherapy since 2016. She recently resumed services and was last seen 4 weeks ago. She reports increased depressed mood, poor motivation, anxiety, decreased interest in activities, diminished pleasure in activities, lack of appetite, excessive worry, and fatigue since last session. Patient also reports increased worry about having another panic attack as she had one about 2 months ago. She expresses frustration with self as she was not able to enjoy the holidays as she has in the past. 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 She also reports she has not   been practicing relaxation techniques  consistently.        Patient last was seen by virtual visit via video about 2 months ago. She states doing okay but reports decreased interest and pleasure in previously enjoyed activities like visiting family at the beach.  She also reports poor concentration, poor motivation, fatigue, poor appetite, and sleep difficulty.  Patient reports sometimes sleeping excessively and then sometimes not sleeping.  She averages about 6 hours per night but reports it takes hours for her to fall asleep.  She reports ruminating thoughts.  She is pleased she has learned to pace self at work and and reports taking breaks as well as time off work when needed.  She also reports experiencing empty nest syndrome as she no longer needs to take care of her grandchildren.  Patient states having difficulty adjusting to this and knowing what to do with self.     Suicidal/Homicidal: No  Therapist Response:  Reviewed symptoms, praised and reinforced patient taking time off work and resting/setting limits regarding work schedule, assisted patient try to identify triggers of increased symptoms of depression, facilitated expression of thoughts and feelings, validated feelings, encouraged patient to follow through with medication management appointment with psychiatrist Dr.  Ross, discussed the role of self-care in coping with depression and assisted patient identify ways to improve sleep hygiene and physical activity within her capability, developed plan with patient to write a journal just prior to going to bed to process her day, assigned patient to review handout on progressive muscle relaxation (therapist will send to patient via mail), also encouraged patient to continue using meditation apps and gratitude list, asked patient to make a list of her interests in preparation for next session    Plan: Return again in 2 weeks.            Diagnosis: Axis I: Major Depressive Disorder, Recurrent, Moderate    Axis II: No  diagnosis    Alonza Smoker, LCSW 03/01/2019

## 2019-03-07 ENCOUNTER — Other Ambulatory Visit: Payer: Self-pay

## 2019-03-07 ENCOUNTER — Ambulatory Visit (INDEPENDENT_AMBULATORY_CARE_PROVIDER_SITE_OTHER): Payer: BC Managed Care – PPO | Admitting: Psychiatry

## 2019-03-07 ENCOUNTER — Encounter (HOSPITAL_COMMUNITY): Payer: Self-pay | Admitting: Psychiatry

## 2019-03-07 DIAGNOSIS — F331 Major depressive disorder, recurrent, moderate: Secondary | ICD-10-CM | POA: Diagnosis not present

## 2019-03-07 MED ORDER — DULOXETINE HCL 60 MG PO CPEP: 120.0000 mg | ORAL_CAPSULE | Freq: Every day | ORAL | 2 refills | Status: DC

## 2019-03-07 MED ORDER — PRAZOSIN HCL 1 MG PO CAPS: 1.0000 mg | ORAL_CAPSULE | Freq: Every day | ORAL | 2 refills | Status: DC

## 2019-03-07 MED ORDER — CLONAZEPAM 0.5 MG PO TABS: 0.5000 mg | ORAL_TABLET | Freq: Every day | ORAL | 2 refills | Status: DC | PRN

## 2019-03-07 NOTE — Progress Notes (Signed)
Virtual Visit via Video Note  I connected with Pamela Walters on 03/07/19 at  8:40 AM EST by a video enabled telemedicine application and verified that I am speaking with the correct person using two identifiers.   I discussed the limitations of evaluation and management by telemedicine and the availability of in person appointments. The patient expressed understanding and agreed to proceed.     I discussed the assessment and treatment plan with the patient. The patient was provided an opportunity to ask questions and all were answered. The patient agreed with the plan and demonstrated an understanding of the instructions.   The patient was advised to call back or seek an in-person evaluation if the symptoms worsen or if the condition fails to improve as anticipated.  I provided 15 minutes of non-face-to-face time during this encounter.   [name], MD  [facility] MD/PA/NP OP Progress Note  03/07/2019 9:06 AM Pamela Walters  MRN:  [id number]  Chief Complaint:  Chief Complaint    Depression; Anxiety; Follow-up     HPI: This patient is a 58 year old married white female who lives with her husband in [place].  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  She states that lately she has been more stressed.  It has been harder for her to relax and go to sleep.  She still working the night shift.  Unfortunately her hospital is getting more acutely ill patients many of them with COVID-19.  Theoretically given her age and underlying health conditions she should not be working with these patients but they do not have enough staff and she feels obligated to do so.  I think this is a big part of her stress.  She states to that she is always had either at children or grandchildren living with her and the last one left about a year ago and she feels like she has no purpose without taking care of  children.  She has been going to therapy here and they are working on helping her find things that she enjoys outside of work.  She denies being suicidal in the least.  I suggested that we go up on her Cymbalta to 120 and she agrees.  She is also having a lot of chronic back and knee pain and it may help this as well. Visit Diagnosis:    ICD-10-CM   1. Major depressive disorder, recurrent episode, moderate (HCC)  F33.1     Past Psychiatric History: none  Past Medical History:  Past Medical History:  Diagnosis Date  . Allergy   . Back pain   . Bell's palsy 08/2017  . Depression   . GERD (gastroesophageal reflux disease)   . Hepatic steatosis   . Hiatal hernia   . Hyperlipidemia   . Hypertension   . Internal hemorrhoids   . MS (multiple sclerosis) ([place])    2007  . Multiple sclerosis ([place])   . Multiple sclerosis ([place]) 04/04/2008   Qualifier: Diagnosis of  By: Moshe Cipro MD, Joycelyn Schmid    . Schatzki's ring   . Sleep apnea    wears CPAP    Past Surgical History:  Procedure Laterality Date  . ABDOMINAL HYSTERECTOMY  2005   fibroids  . CARDIAC CATHETERIZATION N/A 05/04/2016   Procedure: Left Heart Cath and Coronary Angiography;  Surgeon: Jettie Booze, MD;  Location: [facility];  Service: Cardiovascular;  Laterality: N/A;  .  ESOPHAGOGASTRODUODENOSCOPY N/A 09/18/2013   Procedure: ESOPHAGOGASTRODUODENOSCOPY (EGD);  Surgeon: Jerene Bears, MD;  Location: [facility];  Service: Gastroenterology;  Laterality: N/A;  . UPPER GASTROINTESTINAL ENDOSCOPY    . WISDOM TOOTH EXTRACTION      Family Psychiatric History: see below  Family History:  Family History  Problem Relation Age of Onset  . Hypertension Mother   . Hyperlipidemia Mother   . Depression Mother   . Hypertension Father   . Colon cancer Neg Hx   . Esophageal cancer Neg Hx   . Rectal cancer Neg Hx   . Stomach cancer Neg Hx     Social History:  Social History   Socioeconomic History  . Marital status:  Married    Spouse name: Not on file  . Number of children: Not on file  . Years of education: Not on file  . Highest education level: Not on file  Occupational History  . Not on file  Social Needs  . Financial resource strain: Not on file  . Food insecurity    Worry: Not on file    Inability: Not on file  . Transportation needs    Medical: Not on file    Non-medical: Not on file  Tobacco Use  . Smoking status: Never Smoker  . Smokeless tobacco: Never Used  Substance and Sexual Activity  . Alcohol use: No  . Drug use: No  . Sexual activity: Yes    Birth control/protection: Surgical  Lifestyle  . Physical activity    Days per week: Not on file    Minutes per session: Not on file  . Stress: Not on file  Relationships  . Social Herbalist on phone: Not on file    Gets together: Not on file    Attends religious service: Not on file    Active member of club or organization: Not on file    Attends meetings of clubs or organizations: Not on file    Relationship status: Not on file  Other Topics Concern  . Not on file  Social History Narrative  . Not on file    Allergies: No Known Allergies  Metabolic Disorder Labs: Lab Results  Component Value Date   HGBA1C 5.5 05/10/2017   MPG 111 05/10/2017   MPG 108 02/20/2016   No results found for: PROLACTIN Lab Results  Component Value Date   CHOL 118 01/30/2019   TRIG 100 01/30/2019   HDL 54 01/30/2019   CHOLHDL 2.2 01/30/2019   VLDL 34 (H) 06/30/2016   LDLCALC 45 01/30/2019   LDLCALC 141 (H) 03/07/2018   Lab Results  Component Value Date   TSH 2.36 01/30/2019   TSH 2.36 05/10/2017    Therapeutic Level Labs: No results found for: LITHIUM No results found for: VALPROATE No components found for:  CBMZ  Current Medications: Current Outpatient Medications  Medication Sig Dispense Refill  . amLODipine (NORVASC) 2.5 MG tablet TAKE 1 TABLET BY MOUTH  DAILY 90 tablet 3  . Armodafinil 250 MG tablet TAKE 1  TABLET BY MOUTH  DAILY 30 tablet 5  . aspirin 81 MG tablet Take 81 mg by mouth daily.    . clonazePAM (KLONOPIN) 0.5 MG tablet Take 1 tablet (0.5 mg total) by mouth daily as needed for anxiety. 30 tablet 2  . DULoxetine (CYMBALTA) 60 MG capsule Take 2 capsules (120 mg total) by mouth daily. 30 capsule 2  . ezetimibe (ZETIA) 10 MG tablet TAKE 1 TABLET BY MOUTH  DAILY 90 tablet 3  . gabapentin (NEURONTIN) 600 MG tablet Take 1 tablet (600 mg total) by mouth 3 (three) times daily. 270 tablet 3  . metaxalone (SKELAXIN) 800 MG tablet Take 1 tablet (800 mg total) by mouth 3 (three) times daily. 90 tablet 3  . mirabegron ER (MYRBETRIQ) 25 MG TB24 tablet Take 1 tablet (25 mg total) by mouth daily. 30 tablet 3  . mometasone (NASONEX) 50 MCG/ACT nasal spray USE 2 SPRAYS NASALLY DAILY 51 g 1  . Multiple Vitamin (MULTIVITAMIN WITH MINERALS) TABS tablet Take 1 tablet by mouth daily.    . pantoprazole (PROTONIX) 40 MG tablet Take 1 tablet (40 mg total) by mouth every morning. 90 tablet 0  . prazosin (MINIPRESS) 1 MG capsule Take 1 capsule (1 mg total) by mouth at bedtime. 30 capsule 2  . rosuvastatin (CRESTOR) 40 MG tablet TAKE 1 TABLET BY MOUTH  DAILY 90 tablet 3  . traMADol (ULTRAM) 50 MG tablet TAKE 1 TO 2 TABLETS BY  MOUTH UP TO 4 PILLS DAILY  AS NEEDED , BUT NO MORE THAN 90 TABLETS PER MONTH 270 tablet 1  . triamterene-hydrochlorothiazide (MAXZIDE-25) 37.5-25 MG tablet Take 1 tablet by mouth daily. 90 tablet 1   No current facility-administered medications for this visit.    Facility-Administered Medications Ordered in Other Visits  Medication Dose Route Frequency Provider Last Rate Last Dose  . gadopentetate dimeglumine (MAGNEVIST) injection 20 mL  20 mL Intravenous Once PRN Sater, Nanine Means, MD         Musculoskeletal: Strength & Muscle Tone: within normal limits Gait & Station: normal Patient leans: N/A  Psychiatric Specialty Exam: Review of Systems  Constitutional: Positive for  malaise/fatigue.  Musculoskeletal: Positive for back pain and joint pain.  Psychiatric/Behavioral: Positive for depression. The patient is nervous/anxious.   All other systems reviewed and are negative.   There were no vitals taken for this visit.There is no height or weight on file to calculate BMI.  General Appearance: Casual, Neat and Well Groomed  Eye Contact:  Good  Speech:  Clear and Coherent  Volume:  Normal  Mood:  Anxious and Dysphoric  Affect:  Constricted  Thought Process:  Goal Directed  Orientation:  Full (Time, Place, and Person)  Thought Content: Rumination   Suicidal Thoughts:  No  Homicidal Thoughts:  No  Memory:  Immediate;   Good Recent;   Good Remote;   Good  Judgement:  Good  Insight:  Good  Psychomotor Activity:  Decreased  Concentration:  Concentration: Good and Attention Span: Good  Recall:  Good  Fund of Knowledge: Good  Language: Good  Akathisia:  No  Handed:  Right  AIMS (if indicated): not done  Assets:  Communication Skills Desire for Improvement Resilience Social Support Talents/Skills Vocational/Educational  ADL's:  Intact  Cognition: WNL  Sleep:  Fair   Screenings: PHQ2-9     Office Visit from 01/30/2019 in [facility] from 06/01/2018 in [facility] Office Visit from 03/07/2018 in [facility] Office Visit from 01/05/2018 in [facility] from 12/22/2017 in [facility]  PHQ-2 Total Score  4  4  2  3  1   (Pended)   PHQ-9 Total Score  15  16  10  12   -       Assessment and Plan: This patient is a 58 year old female with a history of depression and anxiety.  She is does not seem to be doing  as well I am convinced that a lot of it has to do with her work situation and feeling overwhelmed.  Unfortunately she does not feel like she can change her quit her job right now.  Since she is a bit more depressed we  will increase Cymbalta from 90 to 120 mg daily.  She will continue prazosin 1 mg at bedtime for nightmares and clonazepam 0.5 mg daily as needed for anxiety.  She will return to see me in 2 months or call sooner if needed   [name], MD 03/07/2019, 9:06 AM

## 2019-03-10 ENCOUNTER — Telehealth: Payer: Self-pay | Admitting: *Deleted

## 2019-03-10 NOTE — Telephone Encounter (Signed)
Pt needs all of her prescriptions sent to [facility] from now on they are too high at [facility]

## 2019-03-10 NOTE — Telephone Encounter (Signed)
Patients pharmacy changed to [facility]

## 2019-03-15 ENCOUNTER — Other Ambulatory Visit: Payer: Self-pay

## 2019-03-15 ENCOUNTER — Ambulatory Visit (INDEPENDENT_AMBULATORY_CARE_PROVIDER_SITE_OTHER): Payer: BC Managed Care – PPO | Admitting: Psychiatry

## 2019-03-15 DIAGNOSIS — F331 Major depressive disorder, recurrent, moderate: Secondary | ICD-10-CM | POA: Diagnosis not present

## 2019-03-15 NOTE — Progress Notes (Signed)
Virtual Visit via Video Note  I connected with BRITISH WEINBERG on 03/15/19 at 9:10 AM by a video enabled telemedicine application and verified that I am speaking with the correct person using two identifiers.   I discussed the limitations of evaluation and management by telemedicine and the availability of in person appointments. The patient expressed understanding and agreed to proceed.  I provided 45 minutes of non-face-to-face time during this encounter.   Alonza Smoker, LCSW                     THERAPIST PROGRESS NOTE          Session Time:    Wednesday 03/15/2019 9:10 AM -  9:55 AM   Participation Level: Active  Behavioral Response: Casual/ alert, euthymic             Type of Therapy: Individual Therapy        Treatment Goals:                       1. Learn and implement coping and behavioral strategies to overcome depression.      2. Identify and replace thoughts and beliefs that support depression.                                 Treatment Goals addressed: 1,2  Interventions: Supportive,  CBT  Summary: FALIN ADERMAN is a 58 y.o. female who is referred for services by PCP Dr. Moshe Cipro due to patient suffering symptoms of depression. Patient has been seen in the office interminttently for medication management and outpatient psychotherapy since 2016. She recently resumed services and was last seen 4 weeks ago. She reports increased depressed mood, poor motivation, anxiety, decreased interest in activities, diminished pleasure in activities, lack of appetite, excessive worry, and fatigue since last session. Patient also reports increased worry about having another panic attack as she had one about 2 months ago. She expresses frustration with self as she was not able to enjoy the holidays as she has in the past. 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 She also reports she has not   been practicing relaxation techniques consistently.         Patient last was seen by virtual visit via video about 2 weeks ago.  She reports increased energy, increased motivation, and improved mood since taking increased dosage of Cymbalta as instructed by psychiatrist Dr. Harrington Challenger.  She reports increased involvement in various activities such as performing household tasks.she also is looking forward to visiting her grandchildren at the beach this weekend and already has planned doing craft projects with her granddaughter.  Patient reports managing stress on job better and decreased ruminating thoughts prior to going to sleep.  She reports she has been journaling just prior to going to bed and says this has been very helpful.  She also has continued to use a gratitude list and has been practicing progressive muscle relaxation.  She reports these have been helpful as well.  Patient reports decreased negative thoughts and increased acceptance of transition to an empty nest.  She is looking forward to remodeling her home to reflect this new phase in her life.  She has been looking on Pinterest for decorating ideas.   Suicidal/Homicidal: No  Therapist Response:  Reviewed symptoms, praised and reinforced patient increased behavioral activation/use of journaling/and use of progressive muscle  relaxation, discussed effects on thoughts/mood/behavior, discussed the role of balance between responsibilities,self-care, pleasurable activities in managing stress/anxiety/and depression, assisted patient identify ways to maintain balance and consistent efforts through the use of a daily planner and calendar, assigned patient to use calendar and bring completed calendar to next session.  Plan: Return again in 2 weeks.            Diagnosis: Axis I: Major Depressive Disorder, Recurrent, Moderate    Axis II: No diagnosis    Alonza Smoker, LCSW 03/15/2019

## 2019-03-22 ENCOUNTER — Other Ambulatory Visit: Payer: Self-pay | Admitting: Family Medicine

## 2019-03-22 DIAGNOSIS — I10 Essential (primary) hypertension: Secondary | ICD-10-CM

## 2019-03-25 ENCOUNTER — Other Ambulatory Visit: Payer: Self-pay | Admitting: Family Medicine

## 2019-03-29 ENCOUNTER — Other Ambulatory Visit: Payer: Self-pay

## 2019-03-29 ENCOUNTER — Ambulatory Visit (INDEPENDENT_AMBULATORY_CARE_PROVIDER_SITE_OTHER): Payer: BC Managed Care – PPO | Admitting: Psychiatry

## 2019-03-29 DIAGNOSIS — F331 Major depressive disorder, recurrent, moderate: Secondary | ICD-10-CM

## 2019-03-29 NOTE — Progress Notes (Signed)
Virtual Visit via Video Note  I connected with Pamela Walters on 03/29/19 at  9:00 AM EST by a video enabled telemedicine application and verified that I am speaking with the correct person using two identifiers.   I discussed the limitations of evaluation and management by telemedicine and the availability of in person appointments. The patient expressed understanding and agreed to proceed.   I provided 45 minutes of non-face-to-face time during this encounter.   Pamela Smoker, LCSW                     THERAPIST PROGRESS NOTE          Session Time:    Wednesday 03/29/2019 9:00 AM - 9:45 AM   Participation Level: Active  Behavioral Response: Casual/ alert, euthymic             Type of Therapy: Individual Therapy        Treatment Goals:                       1. Learn and implement coping and behavioral strategies to overcome depression.      2. Identify and replace thoughts and beliefs that support depression.                                 Treatment Goals addressed: 1,2  Interventions: Supportive,  CBT  Summary: Pamela Walters is a 58 y.o. female who is referred for services by PCP Dr. Moshe Cipro due to patient suffering symptoms of depression. Patient has been seen in the office interminttently for medication management and outpatient psychotherapy since 2016. She recently resumed services and was last seen 4 weeks ago. She reports increased depressed mood, poor motivation, anxiety, decreased interest in activities, diminished pleasure in activities, lack of appetite, excessive worry, and fatigue since last session. Patient also reports increased worry about having another panic attack as she had one about 2 months ago. She expresses frustration with self as she was not able to enjoy the holidays as she has in the past. 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 She also reports she has not   been practicing relaxation techniques consistently.         Patient last was seen by virtual visit via video about 2 weeks ago.  She reports doing well since last session.  She expresses disappointment she was unable to celebrate Thanksgiving with her extended family in the way she normally does.  However, she reports the holiday was nice.  She used positive self talk and engaged in activity including preparing a meal for her and her husband.  She also connected with extended family via [place].  She reports managing the holiday better than she thought she would.  She also is pleased she has been setting limits regarding her work hours.  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  She reports trying to pace self on job and using mindfulness skills when distressed.  She reports not receiving handouts on daily planning but has been thinking about ways to improve structure and routine.  Patient continues to have difficulty establishing and maintaining balance and consistency regarding responsibilities, self-care, and pleasurable activities.  Suicidal/Homicidal: No  Therapist Response:  Reviewed symptoms, praised and reinforced patient's use of healthy coping techniques to manage the holiday,  discussed effects on mood/thoughts/behavior, reviewed rationale for using a daily planner and calendar to establish and maintain balance and consistency, assisted patient identify activities to include on calendar, assigned patient to use calendar and bring completed calendar to next session  Plan: Return again in 2 weeks.            Diagnosis: Axis I: Major Depressive Disorder, Recurrent, Moderate    Axis II: No diagnosis    Pamela Smoker, LCSW 03/29/2019

## 2019-04-12 ENCOUNTER — Ambulatory Visit (HOSPITAL_COMMUNITY): Payer: BC Managed Care – PPO | Admitting: Psychiatry

## 2019-04-18 DIAGNOSIS — M5136 Other intervertebral disc degeneration, lumbar region: Secondary | ICD-10-CM | POA: Diagnosis not present

## 2019-04-24 ENCOUNTER — Telehealth (HOSPITAL_COMMUNITY): Payer: Self-pay | Admitting: Psychiatry

## 2019-04-24 ENCOUNTER — Other Ambulatory Visit: Payer: Self-pay

## 2019-04-24 ENCOUNTER — Ambulatory Visit (HOSPITAL_COMMUNITY): Payer: BC Managed Care – PPO | Admitting: Psychiatry

## 2019-04-24 NOTE — Telephone Encounter (Signed)
Therapist attempted to contact patient twice via text through [email or web address] platform for scheduled appointment, no response.  Therapist called patient and left message indicating attempt and requesting patient call office.

## 2019-05-08 ENCOUNTER — Ambulatory Visit (HOSPITAL_COMMUNITY): Payer: BC Managed Care – PPO | Admitting: Psychiatry

## 2019-05-09 ENCOUNTER — Ambulatory Visit (INDEPENDENT_AMBULATORY_CARE_PROVIDER_SITE_OTHER): Payer: Self-pay | Admitting: Psychiatry

## 2019-05-09 ENCOUNTER — Other Ambulatory Visit: Payer: Self-pay

## 2019-05-09 ENCOUNTER — Encounter (HOSPITAL_COMMUNITY): Payer: Self-pay | Admitting: Psychiatry

## 2019-05-09 DIAGNOSIS — F331 Major depressive disorder, recurrent, moderate: Secondary | ICD-10-CM

## 2019-05-09 DIAGNOSIS — F419 Anxiety disorder, unspecified: Secondary | ICD-10-CM

## 2019-05-09 MED ORDER — PRAZOSIN HCL 1 MG PO CAPS: 1.0000 mg | ORAL_CAPSULE | Freq: Every day | ORAL | 2 refills | Status: DC

## 2019-05-09 MED ORDER — CLONAZEPAM 0.5 MG PO TABS: 0.5000 mg | ORAL_TABLET | Freq: Every day | ORAL | 2 refills | Status: DC | PRN

## 2019-05-09 MED ORDER — DULOXETINE HCL 60 MG PO CPEP: 120.0000 mg | ORAL_CAPSULE | Freq: Every day | ORAL | 2 refills | Status: DC

## 2019-05-09 NOTE — Progress Notes (Signed)
Virtual Visit via Video Note  I connected with Pamela Walters on 05/09/19 at 10:40 AM EST by a video enabled telemedicine application and verified that I am speaking with the correct person using two identifiers.   I discussed the limitations of evaluation and management by telemedicine and the availability of in person appointments. The patient expressed understanding and agreed to proceed.    I discussed the assessment and treatment plan with the patient. The patient was provided an opportunity to ask questions and all were answered. The patient agreed with the plan and demonstrated an understanding of the instructions.   The patient was advised to call back or seek an in-person evaluation if the symptoms worsen or if the condition fails to improve as anticipated.  I provided 10minutes of non-face-to-face time during this encounter.   Pamela Spiller, MD  [facility] MD/PA/NP OP Progress Note  05/09/2019 10:47 AM Pamela Walters  MRN:  [id number]  Chief Complaint:  Chief Complaint    Depression; Anxiety; Follow-up     HPI: This patient is a 59 year old married white female who lives with her husband in [place].  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  The patient returns after 2 months for follow-up regarding depression and anxiety.  Last time she was very stressed because she was working in a hospital that was getting overwhelmed with coronavirus patients.  Unfortunately this is still the case and she has had to work with these high risk patients.  We did increase her Cymbalta to 60 mg twice daily and she thinks it is helping her mood and her mental clarity.  She feels "less foggy."  She states that she is putting up with her job but try not to overdo it or take extra shifts.  For the most part she is sleeping well and very occasionally has nightmares and she thinks the prazosin has gone a long way to cut down the frequency.  Her depression and anxiety are under good control  for the most part.  She denies any thoughts of self-harm.  She still has chronic back and knee pain but recently got an injection in her back that has been helpful. Visit Diagnosis:    ICD-10-CM   1. Major depressive disorder, recurrent episode, moderate (HCC)  F33.1     Past Psychiatric History: none  Past Medical History:  Past Medical History:  Diagnosis Date  . Allergy   . Back pain   . Bell's palsy 08/2017  . Depression   . GERD (gastroesophageal reflux disease)   . Hepatic steatosis   . Hiatal hernia   . Hyperlipidemia   . Hypertension   . Internal hemorrhoids   . MS (multiple sclerosis) ([place])    2007  . Multiple sclerosis ([place])   . Multiple sclerosis ([place]) 04/04/2008   Qualifier: Diagnosis of  By: Moshe Cipro MD, Joycelyn Schmid    . Schatzki's ring   . Sleep apnea    wears CPAP    Past Surgical History:  Procedure Laterality Date  . ABDOMINAL HYSTERECTOMY  2005   fibroids  . CARDIAC CATHETERIZATION N/A 05/04/2016   Procedure: Left Heart Cath and Coronary Angiography;  Surgeon: Jettie Booze, MD;  Location: [facility];  Service: Cardiovascular;  Laterality: N/A;  . ESOPHAGOGASTRODUODENOSCOPY N/A 09/18/2013   Procedure: ESOPHAGOGASTRODUODENOSCOPY (EGD);  Surgeon: Jerene Bears, MD;  Location: [facility];  Service: Gastroenterology;  Laterality: N/A;  . UPPER GASTROINTESTINAL ENDOSCOPY    . WISDOM TOOTH  EXTRACTION      Family Psychiatric History: see below  Family History:  Family History  Problem Relation Age of Onset  . Hypertension Mother   . Hyperlipidemia Mother   . Depression Mother   . Hypertension Father   . Colon cancer Neg Hx   . Esophageal cancer Neg Hx   . Rectal cancer Neg Hx   . Stomach cancer Neg Hx     Social History:  Social History   Socioeconomic History  . Marital status: Married    Spouse name: Not on file  . Number of children: Not on file  . Years of education: Not on file  . Highest education level: Not on file   Occupational History  . Not on file  Tobacco Use  . Smoking status: Never Smoker  . Smokeless tobacco: Never Used  Substance and Sexual Activity  . Alcohol use: No  . Drug use: No  . Sexual activity: Yes    Birth control/protection: Surgical  Other Topics Concern  . Not on file  Social History Narrative  . Not on file   Social Determinants of Health   Financial Resource Strain:   . Difficulty of Paying Living Expenses: Not on file  Food Insecurity:   . Worried About Charity fundraiser in the Last Year: Not on file  . Ran Out of Food in the Last Year: Not on file  Transportation Needs:   . Lack of Transportation (Medical): Not on file  . Lack of Transportation (Non-Medical): Not on file  Physical Activity:   . Days of Exercise per Week: Not on file  . Minutes of Exercise per Session: Not on file  Stress:   . Feeling of Stress : Not on file  Social Connections:   . Frequency of Communication with Friends and Family: Not on file  . Frequency of Social Gatherings with Friends and Family: Not on file  . Attends Religious Services: Not on file  . Active Member of Clubs or Organizations: Not on file  . Attends Archivist Meetings: Not on file  . Marital Status: Not on file    Allergies: No Known Allergies  Metabolic Disorder Labs: Lab Results  Component Value Date   HGBA1C 5.5 05/10/2017   MPG 111 05/10/2017   MPG 108 02/20/2016   No results found for: PROLACTIN Lab Results  Component Value Date   CHOL 118 01/30/2019   TRIG 100 01/30/2019   HDL 54 01/30/2019   CHOLHDL 2.2 01/30/2019   VLDL 34 (H) 06/30/2016   LDLCALC 45 01/30/2019   LDLCALC 141 (H) 03/07/2018   Lab Results  Component Value Date   TSH 2.36 01/30/2019   TSH 2.36 05/10/2017    Therapeutic Level Labs: No results found for: LITHIUM No results found for: VALPROATE No components found for:  CBMZ  Current Medications: Current Outpatient Medications  Medication Sig Dispense Refill   . amLODipine (NORVASC) 2.5 MG tablet Take 1 tablet by mouth once daily 30 tablet 3  . Armodafinil 250 MG tablet TAKE 1 TABLET BY MOUTH  DAILY 30 tablet 5  . aspirin 81 MG tablet Take 81 mg by mouth daily.    . clonazePAM (KLONOPIN) 0.5 MG tablet Take 1 tablet (0.5 mg total) by mouth daily as needed for anxiety. 30 tablet 2  . DULoxetine (CYMBALTA) 60 MG capsule Take 2 capsules (120 mg total) by mouth daily. 60 capsule 2  . ezetimibe (ZETIA) 10 MG tablet TAKE 1 TABLET BY MOUTH  DAILY 90 tablet 3  . gabapentin (NEURONTIN) 600 MG tablet Take 1 tablet (600 mg total) by mouth 3 (three) times daily. 270 tablet 3  . metaxalone (SKELAXIN) 800 MG tablet Take 1 tablet (800 mg total) by mouth 3 (three) times daily. 90 tablet 3  . mirabegron ER (MYRBETRIQ) 25 MG TB24 tablet Take 1 tablet (25 mg total) by mouth daily. 30 tablet 3  . mometasone (NASONEX) 50 MCG/ACT nasal spray USE 2 SPRAYS NASALLY DAILY 51 g 1  . Multiple Vitamin (MULTIVITAMIN WITH MINERALS) TABS tablet Take 1 tablet by mouth daily.    . pantoprazole (PROTONIX) 40 MG tablet Take 1 tablet (40 mg total) by mouth every morning. 90 tablet 0  . prazosin (MINIPRESS) 1 MG capsule Take 1 capsule (1 mg total) by mouth at bedtime. 30 capsule 2  . rosuvastatin (CRESTOR) 40 MG tablet TAKE 1 TABLET BY MOUTH  DAILY 90 tablet 3  . traMADol (ULTRAM) 50 MG tablet TAKE 1 TO 2 TABLETS BY  MOUTH UP TO 4 PILLS DAILY  AS NEEDED , BUT NO MORE THAN 90 TABLETS PER MONTH 270 tablet 1  . triamterene-hydrochlorothiazide (MAXZIDE-25) 37.5-25 MG tablet TAKE 1 TABLET BY MOUTH  DAILY 90 tablet 2   No current facility-administered medications for this visit.   Facility-Administered Medications Ordered in Other Visits  Medication Dose Route Frequency Provider Last Rate Last Admin  . gadopentetate dimeglumine (MAGNEVIST) injection 20 mL  20 mL Intravenous Once PRN Sater, Nanine Means, MD         Musculoskeletal: Strength & Muscle Tone: within normal limits Gait & Station:  normal Patient leans: N/A  Psychiatric Specialty Exam: Review of Systems  Musculoskeletal: Positive for arthralgias and back pain.  All other systems reviewed and are negative.   There were no vitals taken for this visit.There is no height or weight on file to calculate BMI.  General Appearance: Casual, Neat and Well Groomed  Eye Contact:  Good  Speech:  Clear and Coherent  Volume:  Normal  Mood:  Euthymic  Affect:  Appropriate and Congruent  Thought Process:  Goal Directed  Orientation:  Full (Time, Place, and Person)  Thought Content: WDL   Suicidal Thoughts:  No  Homicidal Thoughts:  No  Memory:  Immediate;   Good Recent;   Good Remote;   Fair  Judgement:  Good  Insight:  Good  Psychomotor Activity:  Normal  Concentration:  Concentration: Good and Attention Span: Good  Recall:  Good  Fund of Knowledge: Good  Language: Good  Akathisia:  No  Handed:  Right  AIMS (if indicated): not done  Assets:  Communication Skills Desire for Improvement Physical Health Resilience Social Support Talents/Skills Vocational/Educational  ADL's:  Intact  Cognition: WNL  Sleep:  Good   Screenings: PHQ2-9     Office Visit from 01/30/2019 in [facility] Counselor from 06/01/2018 in [facility] Office Visit from 03/07/2018 in [facility] Office Visit from 01/05/2018 in [facility]or from 12/22/2017 in [facility]  PHQ-2 Total Score  4  4  2  3  1   (Pended)   PHQ-9 Total Score  15  16  10  12   --       Assessment and Plan: This patient is a 59 year old female with a history of depression and anxiety.  She is doing somewhat better with the increase in Cymbalta to 120 mg daily.  We will continue this dosage as well  as prazosin 1 mg at bedtime for nightmares and clonazepam 0.5 mg daily as needed for anxiety.  She will return to see me in 3 months or call sooner if  needed   Pamela Spiller, MD 05/09/2019, 10:47 AM

## 2019-05-11 ENCOUNTER — Ambulatory Visit (HOSPITAL_COMMUNITY): Payer: Self-pay | Admitting: Psychiatry

## 2019-05-18 ENCOUNTER — Encounter: Payer: Self-pay | Admitting: Family Medicine

## 2019-05-25 ENCOUNTER — Other Ambulatory Visit: Payer: Self-pay

## 2019-05-25 ENCOUNTER — Ambulatory Visit (INDEPENDENT_AMBULATORY_CARE_PROVIDER_SITE_OTHER): Payer: BC Managed Care – PPO | Admitting: Psychiatry

## 2019-05-25 DIAGNOSIS — F331 Major depressive disorder, recurrent, moderate: Secondary | ICD-10-CM

## 2019-05-25 NOTE — Progress Notes (Signed)
Virtual Visit via Video Note  I connected with Pamela Walters on 05/25/19 at  9:00 AM EST by a video enabled telemedicine application and verified that I am speaking with the correct person using two identifiers.   I discussed the limitations of evaluation and management by telemedicine and the availability of in person appointments. The patient expressed understanding and agreed to proceed.  I provided 25 minutes of non-face-to-face time during this encounter.   Alonza Smoker, LCSW                      THERAPIST PROGRESS NOTE          Session Time:    Thursday 05/25/2019 9:00 AM - 9:25 AM  Participation Level: Active  Behavioral Response: Casual/ alert, anxious          Type of Therapy: Individual Therapy        Treatment Goals:                       1. Learn and implement coping and behavioral strategies to overcome depression.      2. Identify and replace thoughts and beliefs that support depression.                                 Treatment Goals addressed: 1,2  Interventions: Supportive,  CBT  Summary: Pamela Walters is a 59 y.o. female who is referred for services by PCP Dr. Moshe Cipro due to patient suffering symptoms of depression. Patient has been seen in the office interminttently for medication management and outpatient psychotherapy since 2016. She recently resumed services and was last seen 4 weeks ago. She reports increased depressed mood, poor motivation, anxiety, decreased interest in activities, diminished pleasure in activities, lack of appetite, excessive worry, and fatigue since last session. Patient also reports increased worry about having another panic attack as she had one about 2 months ago. She expresses frustration with self as she was not able to enjoy the holidays as she has in the past. 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 She also reports she has not   been practicing relaxation techniques consistently.        Patient last was seen by virtual visit via video about 6 weeks ago.  She reports being diagnosed with COVID-19 last Thursday.  She currently is at home in quarantine with her husband.  She reports experiencing muscle aches and severe headache but is thankful she has not experienced any respiratory symptoms.  She reports good support from family, friends, neighbors, and her church family.  She reports minimal symptoms of depression.  She reports medication as prescribed by psychiatrist Dr. Harrington Challenger has helped and now being able to think through things more clearly without the mind clutter.  However, she reports increased anxiety.  She expresses worry about having COVID-19 and possible effects.  She reports she is using clonazepam.  She also has been trying to use self talk and mindfulness activities to cope.  Prior to her diagnosis, patient reports she made improvement in planning along with maintaining balance and consistency regarding activities/responsibilities.  She is pleased she began to have more realistic expectations of self.     Suicidal/Homicidal: No  Therapist Response:  Reviewed symptoms, praised and reinforced patient's efforts to use planning to maintain balance and consistency, praised and reinforced patient's efforts to use helpful  coping strategies to manage anxiety, discussed effects, discussed stressors, facilitated expression of thoughts and feelings, validated feelings, assisted patient identify coping statements, also reviewed ways to use mindfulness using breath awareness as a coping tool   Plan: Return again in 2 weeks.            Diagnosis: Axis I: Major Depressive Disorder, Recurrent, Moderate    Axis II: No diagnosis    Alonza Smoker, LCSW 05/25/2019

## 2019-06-13 ENCOUNTER — Ambulatory Visit: Payer: BC Managed Care – PPO | Admitting: Family Medicine

## 2019-06-20 ENCOUNTER — Ambulatory Visit: Payer: BC Managed Care – PPO | Admitting: Family Medicine

## 2019-06-20 ENCOUNTER — Encounter: Payer: Self-pay | Admitting: Family Medicine

## 2019-06-20 ENCOUNTER — Other Ambulatory Visit: Payer: Self-pay

## 2019-06-20 VITALS — BP 128/78 | HR 68 | Temp 97.7°F | Ht 63.0 in | Wt 169.2 lb

## 2019-06-20 DIAGNOSIS — I10 Essential (primary) hypertension: Secondary | ICD-10-CM

## 2019-06-20 DIAGNOSIS — E785 Hyperlipidemia, unspecified: Secondary | ICD-10-CM

## 2019-06-20 DIAGNOSIS — Z1211 Encounter for screening for malignant neoplasm of colon: Secondary | ICD-10-CM

## 2019-06-20 DIAGNOSIS — Z1231 Encounter for screening mammogram for malignant neoplasm of breast: Secondary | ICD-10-CM

## 2019-06-20 DIAGNOSIS — R1312 Dysphagia, oropharyngeal phase: Secondary | ICD-10-CM

## 2019-06-20 DIAGNOSIS — R1314 Dysphagia, pharyngoesophageal phase: Secondary | ICD-10-CM

## 2019-06-20 DIAGNOSIS — Z8616 Personal history of COVID-19: Secondary | ICD-10-CM | POA: Diagnosis not present

## 2019-06-20 DIAGNOSIS — E663 Overweight: Secondary | ICD-10-CM

## 2019-06-20 DIAGNOSIS — N39498 Other specified urinary incontinence: Secondary | ICD-10-CM

## 2019-06-20 DIAGNOSIS — F418 Other specified anxiety disorders: Secondary | ICD-10-CM

## 2019-06-20 NOTE — Patient Instructions (Addendum)
F/U in October, call if you need me before  Please get first week in April, cBC, lipid, cmp and eGFr , results will be sent via My chart, fasting  Please schedule mammogram at checkout  Please contact Dr Pyrtle ( [facility]) re your appt for endoscopies, they are past due  Thankful you are overall doing better  Try to get gyne exam done before October  It is important that you exercise regularly at least 30 minutes 5 times a week. If you develop chest pain, have severe difficulty breathing, or feel very tired, stop exercising immediately and seek medical attention    Think about what you will eat, plan ahead. Choose " clean, green, fresh or frozen" over canned, processed or packaged foods which are more sugary, salty and fatty. 70 to 75% of food eaten should be vegetables and fruit. Three meals at set times with snacks allowed between meals, but they must be fruit or vegetables. Aim to eat over a 12 hour period , example 7 am to 7 pm, and STOP after  your last meal of the day. Drink water,generally about 64 ounces per day, no other drink is as healthy. Fruit juice is best enjoyed in a healthy way, by EATING the fruit. Thanks for choosing [facility], we consider it a privelige to serve you.  

## 2019-06-20 NOTE — Progress Notes (Signed)
Pamela Walters     MRN: [id number]      DOB: Feb 22, 1961   HPI [name] is here for follow up and re-evaluation of chronic medical conditions, medication management and review of any available recent lab and radiology data.  Preventive health is updated, specifically  Cancer screening and Immunization.   Questions or concerns regarding consultations or procedures which the PT has had in the interim are  addressed. The PT denies any adverse reactions to current medications since the last visit.  Increased solid dysphagia in past 5 months, no change in stool or abdominal pain, colonoscopy and upper endo due  Was diagnosed ith Covid I n January, and was out of work x 3 weeks, some fatigue persists,and no taste no recent fever or chills  ROS Denies recent fever or chills. Denies sinus pressure, nasal congestion, ear pain or sore throat. Denies chest congestion, productive cough or wheezing. Denies chest pains, palpitations and leg swelling Denies abdominal pain, nausea, vomiting,diarrhea or constipation.   Denies dysuria, frequency, hesitancy or incontinence. Denies uncontrolled  joint pain, swelling and limitation in mobility. Denies headaches, seizures, numbness, or tingling. c/o depression, anxiety denies  Insomnia.improved symptoms on current regime Denies skin break down or rash.   PE  BP 128/78 (BP Location: Left Arm, Patient Position: Sitting)   Pulse 68   Temp 97.7 F (36.5 C) (Temporal)   Ht 5\' 3"  (1.6 m)   Wt 169 lb 3.2 oz (76.7 kg)   SpO2 95%   BMI 29.97 kg/m   Patient alert and oriented and in no cardiopulmonary distress.  HEENT: No facial asymmetry, EOMI,     Neck supple .  Chest: Clear to auscultation bilaterally.  CVS: S1, S2 no murmurs, no S3.Regular rate.  ABD: Soft non tender.   Ext: No edema  MS: Adequate ROM spine, shoulders, hips and knees.  Skin: Intact, no ulcerations or rash noted.  Psych: Good eye contact, normal affect. Memory intact not  anxious or depressed appearing.  CNS: CN 2-12 intact, power,  normal throughout.no focal deficits noted.   Assessment & Plan  Essential hypertension Controlled, no change in medication DASH diet and commitment to daily physical activity for a minimum of 30 minutes discussed and encouraged, as a part of hypertension management. The importance of attaining a healthy weight is also discussed.  BP/Weight 06/20/2019 01/30/2019 09/01/2018 03/07/2018 02/11/2018 01/17/2018 [id number]  Systolic BP 0000000 [id number]  Diastolic BP 78 82 86 80 87 76 80  Wt. (Lbs) 169.2 167 166 174 173.5 177.8 169  BMI 29.97 29.58 29.41 30.82 30.73 31.5 29.94  Some encounter information is confidential and restricted. Go to Review Flowsheets activity to see all data.       Hyperlipidemia LDL goal <100 Hyperlipidemia:Low fat diet discussed and encouraged.   Lipid Panel  Lab Results  Component Value Date   CHOL 118 01/30/2019   HDL 54 01/30/2019   LDLCALC 45 01/30/2019   TRIG 100 01/30/2019   CHOLHDL 2.2 01/30/2019   Controlled, no change in medication     History of 2019 novel coronavirus disease (COVID-19) Persistent fatigue and loss of taste 4 weeks after initial diagnosis, improving overall  Urinary incontinence Controlled, no change in medication   Depression with anxiety Closely followed by Psych , improving, not suicidal or homicidal  Overweight (BMI 25.0-29.9)  Patient re-educated about  the importance of commitment to a  minimum of 150 minutes of exercise per week as able.  The importance of healthy food choices with portion control discussed, as well as eating regularly and within a 12 hour window most days. The need to choose "clean , green" food 50 to 75% of the time is discussed, as well as to make water the primary drink and set a goal of 64 ounces water daily.    Weight /BMI 06/20/2019 01/30/2019 09/01/2018  WEIGHT 169 lb 3.2 oz 167 lb 166 lb  HEIGHT 5\' 3"  5\' 3"  5\' 3"     BMI 29.97 kg/m2 29.58 kg/m2 29.41 kg/m2  Some encounter information is confidential and restricted. Go to Review Flowsheets activity to see all data.      Dysphagia Progressing over past 5 months, has benefited in the past from dilation, refer to GI and colonoscopy also currently due

## 2019-06-23 ENCOUNTER — Encounter: Payer: Self-pay | Admitting: Internal Medicine

## 2019-06-24 ENCOUNTER — Encounter: Payer: Self-pay | Admitting: Family Medicine

## 2019-06-24 DIAGNOSIS — R1310 Dysphagia, unspecified: Secondary | ICD-10-CM | POA: Insufficient documentation

## 2019-06-24 NOTE — Assessment & Plan Note (Signed)
Progressing over past 5 months, has benefited in the past from dilation, refer to GI and colonoscopy also currently due

## 2019-06-24 NOTE — Assessment & Plan Note (Signed)
Persistent fatigue and loss of taste 4 weeks after initial diagnosis, improving overall

## 2019-06-24 NOTE — Assessment & Plan Note (Signed)
Hyperlipidemia:Low fat diet discussed and encouraged.   Lipid Panel  Lab Results  Component Value Date   CHOL 118 01/30/2019   HDL 54 01/30/2019   LDLCALC 45 01/30/2019   TRIG 100 01/30/2019   CHOLHDL 2.2 01/30/2019   Controlled, no change in medication

## 2019-06-24 NOTE — Assessment & Plan Note (Signed)
Controlled, no change in medication DASH diet and commitment to daily physical activity for a minimum of 30 minutes discussed and encouraged, as a part of hypertension management. The importance of attaining a healthy weight is also discussed.  BP/Weight 06/20/2019 01/30/2019 09/01/2018 03/07/2018 02/11/2018 01/17/2018 [id number]  Systolic BP 0000000 [id number]  Diastolic BP 78 82 86 80 87 76 80  Wt. (Lbs) 169.2 167 166 174 173.5 177.8 169  BMI 29.97 29.58 29.41 30.82 30.73 31.5 29.94  Some encounter information is confidential and restricted. Go to Review Flowsheets activity to see all data.

## 2019-06-24 NOTE — Assessment & Plan Note (Signed)
  Patient re-educated about  the importance of commitment to a  minimum of 150 minutes of exercise per week as able.  The importance of healthy food choices with portion control discussed, as well as eating regularly and within a 12 hour window most days. The need to choose "clean , green" food 50 to 75% of the time is discussed, as well as to make water the primary drink and set a goal of 64 ounces water daily.    Weight /BMI 06/20/2019 01/30/2019 09/01/2018  WEIGHT 169 lb 3.2 oz 167 lb 166 lb  HEIGHT 5\' 3"  5\' 3"  5\' 3"   BMI 29.97 kg/m2 29.58 kg/m2 29.41 kg/m2  Some encounter information is confidential and restricted. Go to Review Flowsheets activity to see all data.

## 2019-06-24 NOTE — Assessment & Plan Note (Signed)
Controlled, no change in medication  

## 2019-06-24 NOTE — Assessment & Plan Note (Signed)
Closely followed by Psych , improving, not suicidal or homicidal

## 2019-07-14 DIAGNOSIS — H4720 Unspecified optic atrophy: Secondary | ICD-10-CM | POA: Diagnosis not present

## 2019-07-17 ENCOUNTER — Ambulatory Visit (HOSPITAL_COMMUNITY): Payer: BC Managed Care – PPO | Admitting: Psychiatry

## 2019-07-17 ENCOUNTER — Other Ambulatory Visit: Payer: Self-pay

## 2019-07-25 DIAGNOSIS — M722 Plantar fascial fibromatosis: Secondary | ICD-10-CM | POA: Diagnosis not present

## 2019-07-25 DIAGNOSIS — M67471 Ganglion, right ankle and foot: Secondary | ICD-10-CM | POA: Diagnosis not present

## 2019-07-31 ENCOUNTER — Telehealth: Payer: Self-pay | Admitting: *Deleted

## 2019-07-31 ENCOUNTER — Other Ambulatory Visit: Payer: Self-pay

## 2019-07-31 ENCOUNTER — Ambulatory Visit (AMBULATORY_SURGERY_CENTER): Payer: Self-pay | Admitting: *Deleted

## 2019-07-31 VITALS — Temp 97.3°F | Ht 63.0 in | Wt 167.0 lb

## 2019-07-31 DIAGNOSIS — Z86010 Personal history of colonic polyps: Secondary | ICD-10-CM

## 2019-07-31 DIAGNOSIS — Z01818 Encounter for other preprocedural examination: Secondary | ICD-10-CM

## 2019-07-31 MED ORDER — NA SULFATE-K SULFATE-MG SULF 17.5-3.13-1.6 GM/177ML PO SOLN: 1.0000 | Freq: Once | ORAL | 0 refills | Status: AC

## 2019-07-31 NOTE — Progress Notes (Signed)

## 2019-07-31 NOTE — Telephone Encounter (Signed)
Called, LVM asking that pt call back. She is on wait list for appt and Dr. Felecia Shelling has openings tomorrow currently. Asked her to call back asap

## 2019-08-07 ENCOUNTER — Other Ambulatory Visit: Payer: Self-pay

## 2019-08-07 ENCOUNTER — Encounter (HOSPITAL_COMMUNITY): Payer: Self-pay | Admitting: Psychiatry

## 2019-08-07 ENCOUNTER — Ambulatory Visit (INDEPENDENT_AMBULATORY_CARE_PROVIDER_SITE_OTHER): Payer: BC Managed Care – PPO | Admitting: Psychiatry

## 2019-08-07 DIAGNOSIS — F331 Major depressive disorder, recurrent, moderate: Secondary | ICD-10-CM | POA: Diagnosis not present

## 2019-08-07 MED ORDER — DULOXETINE HCL 60 MG PO CPEP: 120.0000 mg | ORAL_CAPSULE | Freq: Every day | ORAL | 2 refills | Status: DC

## 2019-08-07 MED ORDER — CLONAZEPAM 0.5 MG PO TABS: 0.5000 mg | ORAL_TABLET | Freq: Every day | ORAL | 2 refills | Status: DC | PRN

## 2019-08-07 MED ORDER — PRAZOSIN HCL 1 MG PO CAPS: 1.0000 mg | ORAL_CAPSULE | Freq: Every day | ORAL | 2 refills | Status: DC

## 2019-08-07 MED ORDER — BUPROPION HCL ER (XL) 150 MG PO TB24: 150.0000 mg | ORAL_TABLET | ORAL | 2 refills | Status: DC

## 2019-08-07 NOTE — Progress Notes (Signed)
Virtual Visit via Video Note  I connected with Pamela Walters on 08/07/19 at 11:20 AM EDT by a video enabled telemedicine application and verified that I am speaking with the correct person using two identifiers.   I discussed the limitations of evaluation and management by telemedicine and the availability of in person appointments. The patient expressed understanding and agreed to proceed.   I discussed the assessment and treatment plan with the patient. The patient was provided an opportunity to ask questions and all were answered. The patient agreed with the plan and demonstrated an understanding of the instructions.   The patient was advised to call back or seek an in-person evaluation if the symptoms worsen or if the condition fails to improve as anticipated.  I provided 15 minutes of non-face-to-face time during this encounter.   Pamela Spiller, MD  [facility] MD/PA/NP OP Progress Note  08/07/2019 11:34 AM ZEVAEH ADCOX  MRN:  [id number]  Chief Complaint:  Chief Complaint    Depression; Anxiety; Follow-up     HPI: This patient is a 59 year old married white female who lives with her husband in [place].  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  The patient returns for follow-up after 3 months regarding depression and anxiety.  She states that she still feels fatigued with low motivation and sleeping all the time.  She has had her thyroid checked which was normal and she is about to have her CBC checked to make sure she is not anemic.  She is still working nights and works 20 to 25 hours a week and therefore has her sleep schedule somewhat backwards.  I suggested we add Wellbutrin which may give her a measure of energy as long as everything else medically turns out okay.  She states she is sleeping well and in fact is sleeping too much.  She is no longer having the nightmares.  She denies any thoughts of self-harm or suicide.  She feels like her anxiety is under good  control. Visit Diagnosis:    ICD-10-CM   1. Major depressive disorder, recurrent episode, moderate (HCC)  F33.1     Past Psychiatric History: none  Past Medical History:  Past Medical History:  Diagnosis Date  . Allergy   . Anemia   . Anxiety   . Arthritis   . Back pain   . Bell's palsy 08/2017  . Depression   . GERD (gastroesophageal reflux disease)   . Hepatic steatosis   . Hiatal hernia   . History of Bell's palsy 02/11/2018  . Hyperlipidemia   . Hypertension   . Internal hemorrhoids   . MS (multiple sclerosis) ([place])    2007  . Multiple sclerosis ([place])   . Multiple sclerosis ([place]) 04/04/2008   Qualifier: Diagnosis of  By: Moshe Cipro MD, Joycelyn Schmid    . Schatzki's ring   . Sleep apnea    wears CPAP    Past Surgical History:  Procedure Laterality Date  . ABDOMINAL HYSTERECTOMY  2005   fibroids  . CARDIAC CATHETERIZATION N/A 05/04/2016   Procedure: Left Heart Cath and Coronary Angiography;  Surgeon: Jettie Booze, MD;  Location: [facility];  Service: Cardiovascular;  Laterality: N/A;  . COLONOSCOPY    . ESOPHAGOGASTRODUODENOSCOPY N/A 09/18/2013   Procedure: ESOPHAGOGASTRODUODENOSCOPY (EGD);  Surgeon: Jerene Bears, MD;  Location: [facility];  Service: Gastroenterology;  Laterality: N/A;  . UPPER GASTROINTESTINAL ENDOSCOPY    . [place] EXTRACTION      Family Psychiatric  History: see below  Family History:  Family History  Problem Relation Age of Onset  . Hypertension Mother   . Hyperlipidemia Mother   . Depression Mother   . Hypertension Father   . Colon cancer Neg Hx   . Esophageal cancer Neg Hx   . Rectal cancer Neg Hx   . Stomach cancer Neg Hx   . Colon polyps Neg Hx     Social History:  Social History   Socioeconomic History  . Marital status: Married    Spouse name: Not on file  . Number of children: Not on file  . Years of education: Not on file  . Highest education level: Not on file  Occupational History  . Not on file   Tobacco Use  . Smoking status: Never Smoker  . Smokeless tobacco: Never Used  Substance and Sexual Activity  . Alcohol use: No  . Drug use: No  . Sexual activity: Yes    Birth control/protection: Surgical  Other Topics Concern  . Not on file  Social History Narrative  . Not on file   Social Determinants of Health   Financial Resource Strain:   . Difficulty of Paying Living Expenses:   Food Insecurity:   . Worried About Charity fundraiser in the Last Year:   . Arboriculturist in the Last Year:   Transportation Needs:   . Film/video editor (Medical):   Marland Kitchen Lack of Transportation (Non-Medical):   Physical Activity:   . Days of Exercise per Week:   . Minutes of Exercise per Session:   Stress:   . Feeling of Stress :   Social Connections:   . Frequency of Communication with Friends and Family:   . Frequency of Social Gatherings with Friends and Family:   . Attends Religious Services:   . Active Member of Clubs or Organizations:   . Attends Archivist Meetings:   Marland Kitchen Marital Status:     Allergies: No Known Allergies  Metabolic Disorder Labs: Lab Results  Component Value Date   HGBA1C 5.5 05/10/2017   MPG 111 05/10/2017   MPG 108 02/20/2016   No results found for: PROLACTIN Lab Results  Component Value Date   CHOL 118 01/30/2019   TRIG 100 01/30/2019   HDL 54 01/30/2019   CHOLHDL 2.2 01/30/2019   VLDL 34 (H) 06/30/2016   LDLCALC 45 01/30/2019   LDLCALC 141 (H) 03/07/2018   Lab Results  Component Value Date   TSH 2.36 01/30/2019   TSH 2.36 05/10/2017    Therapeutic Level Labs: No results found for: LITHIUM No results found for: VALPROATE No components found for:  CBMZ  Current Medications: Current Outpatient Medications  Medication Sig Dispense Refill  . amLODipine (NORVASC) 2.5 MG tablet Take 1 tablet by mouth once daily 30 tablet 3  . Armodafinil 250 MG tablet TAKE 1 TABLET BY MOUTH  DAILY 30 tablet 5  . aspirin 81 MG tablet Take 81 mg  by mouth daily.    Marland Kitchen buPROPion (WELLBUTRIN XL) 150 MG 24 hr tablet Take 1 tablet (150 mg total) by mouth every morning. 30 tablet 2  . clonazePAM (KLONOPIN) 0.5 MG tablet Take 1 tablet (0.5 mg total) by mouth daily as needed for anxiety. 30 tablet 2  . DULoxetine (CYMBALTA) 60 MG capsule Take 2 capsules (120 mg total) by mouth daily. 60 capsule 2  . ezetimibe (ZETIA) 10 MG tablet TAKE 1 TABLET BY MOUTH  DAILY 90 tablet 3  .  gabapentin (NEURONTIN) 600 MG tablet Take 1 tablet (600 mg total) by mouth 3 (three) times daily. 270 tablet 3  . metaxalone (SKELAXIN) 800 MG tablet Take 1 tablet (800 mg total) by mouth 3 (three) times daily. 90 tablet 3  . mirabegron ER (MYRBETRIQ) 25 MG TB24 tablet Take 1 tablet (25 mg total) by mouth daily. 30 tablet 3  . mometasone (NASONEX) 50 MCG/ACT nasal spray USE 2 SPRAYS NASALLY DAILY 51 g 1  . Multiple Vitamin (MULTIVITAMIN WITH MINERALS) TABS tablet Take 1 tablet by mouth daily.    . pantoprazole (PROTONIX) 40 MG tablet Take 1 tablet (40 mg total) by mouth every morning. 90 tablet 0  . prazosin (MINIPRESS) 1 MG capsule Take 1 capsule (1 mg total) by mouth at bedtime. 30 capsule 2  . rosuvastatin (CRESTOR) 40 MG tablet TAKE 1 TABLET BY MOUTH  DAILY 90 tablet 3  . traMADol (ULTRAM) 50 MG tablet TAKE 1 TO 2 TABLETS BY  MOUTH UP TO 4 PILLS DAILY  AS NEEDED , BUT NO MORE THAN 90 TABLETS PER MONTH 270 tablet 1  . triamterene-hydrochlorothiazide (MAXZIDE-25) 37.5-25 MG tablet TAKE 1 TABLET BY MOUTH  DAILY 90 tablet 2   No current facility-administered medications for this visit.   Facility-Administered Medications Ordered in Other Visits  Medication Dose Route Frequency Provider Last Rate Last Admin  . gadopentetate dimeglumine (MAGNEVIST) injection 20 mL  20 mL Intravenous Once PRN Sater, Nanine Means, MD         Musculoskeletal: Strength & Muscle Tone: within normal limits Gait & Station: normal Patient leans: N/A  Psychiatric Specialty Exam: Review of Systems   Constitutional: Positive for fatigue.  Psychiatric/Behavioral: Positive for dysphoric mood.  All other systems reviewed and are negative.   There were no vitals taken for this visit.There is no height or weight on file to calculate BMI.  General Appearance: Casual and Fairly Groomed  Eye Contact:  Good  Speech:  Clear and Coherent  Volume:  Normal  Mood:  dysphoric  Affect:  Appropriate and Constricted  Thought Process:  Goal Directed  Orientation:  Full (Time, Place, and Person)  Thought Content: Rumination   Suicidal Thoughts:  No  Homicidal Thoughts:  No  Memory:  Immediate;   Good Recent;   Good Remote;   Good  Judgement:  Good  Insight:  Good  Psychomotor Activity:  Decreased  Concentration:  Concentration: Good and Attention Span: Good  Recall:  Good  Fund of Knowledge: Good  Language: Good  Akathisia:  No  Handed:  Right  AIMS (if indicated): not done  Assets:  Communication Skills Desire for Improvement Resilience Social Support Talents/Skills  ADL's:  Intact  Cognition: WNL  Sleep:  Good   Screenings: PHQ2-9     Office Visit from 06/20/2019 in [facility] Office Visit from 01/30/2019 in [facility] from 06/01/2018 in [facility] Office Visit from 03/07/2018 in [facility] Office Visit from 01/05/2018 in [facility]  PHQ-2 Total Score  4  4  4  2  3   PHQ-9 Total Score  12  15  16  10  12        Assessment and Plan: This patient is a 59 year old female with a history of depression and anxiety.  She still has her days and nights backwards due to her work schedule.  She still feels low in energy and somewhat dysphoric.  I will add Wellbutrin XL 150 mg daily to  her regimen.  She will continue Cymbalta 120 mg daily for depression.  She will also continue prazosin 1 mg at bedtime for nightmares and clonazepam 0.5 mg daily as needed for anxiety.  She will return to see me  in 6 weeks   Pamela Spiller, MD 08/07/2019, 11:34 AM

## 2019-08-09 ENCOUNTER — Ambulatory Visit (INDEPENDENT_AMBULATORY_CARE_PROVIDER_SITE_OTHER): Payer: BC Managed Care – PPO | Admitting: Psychiatry

## 2019-08-09 ENCOUNTER — Encounter (HOSPITAL_COMMUNITY): Payer: Self-pay | Admitting: Psychiatry

## 2019-08-09 DIAGNOSIS — F331 Major depressive disorder, recurrent, moderate: Secondary | ICD-10-CM

## 2019-08-09 NOTE — Progress Notes (Signed)
Virtual Visit via Video Note  I connected with Pamela Walters on 08/09/19 at 9:10 AM  by a video enabled telemedicine application and verified that I am speaking with the correct person using two identifiers.   I discussed the limitations of evaluation and management by telemedicine and the availability of in person appointments. The patient expressed understanding and agreed to proceed.  I provided 45 minutes of non-face-to-face time during this encounter.   Alonza Smoker, LCSW                      THERAPIST PROGRESS NOTE          Session Time:   Wednesday 08/09/2019 9:10 AM -  9:55 AM   Participation Level: Active  Behavioral Response: Casual/ alert, anxious          Type of Therapy: Individual Therapy        Treatment Goals:                       1. Learn and implement coping and behavioral strategies to overcome depression.      2. Identify and replace thoughts and beliefs that support depression.                                 Treatment Goals addressed: 1,2  Interventions: Supportive,  CBT  Summary: Pamela Walters is a 59 y.o. female who is referred for services by PCP Dr. Moshe Cipro due to patient suffering symptoms of depression. Patient has been seen in the office interminttently for medication management and outpatient psychotherapy since 2016. She recently resumed services and was last seen 4 weeks ago. She reports increased depressed mood, poor motivation, anxiety, decreased interest in activities, diminished pleasure in activities, lack of appetite, excessive worry, and fatigue since last session. Patient also reports increased worry about having another panic attack as she had one about 2 months ago. She expresses frustration with self as she was not able to enjoy the holidays as she has in the past. 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 She also reports she has not   been practicing relaxation techniques consistently.         Patient last was seen by virtual visit via video visit in January 2021. She is experiencing moderate symptoms of anxiety and decreased symptoms of depression.  She reports being glad she has recovered from COVID-19 but is concerned she still does not have a sense of taste.  She also has some other GI issues and is scheduled to have an endoscopy and colonoscopy Monday.  She expresses some anxiety about this but is trying to avoid catastrophizing.  She reports increased acceptance of having to pace self at work and has set boundaries regarding her work schedule.  She also reports struggling less with letting some things go.  However, she reports still struggling with having an empty nest.  She maintains regular contact with her family and tries to maintain involvement in activities.  She also reports increased thoughts about family as she experiences sadness regarding the recent death of a long time neighbor as well as the death of one of her friends daughters.  Patient reports continued fatigue and low energy and states trying to enjoy life but just not enjoying it like she would like.    Suicidal/Homicidal: No  Therapist Response:  Reviewed symptoms,  praised and reinforced patient's efforts to pace self/set and maintain boundaries regarding work schedule, also praised and reinforced patient try to maintain involvement with family and activities, discussed effects, processed patient's feelings regarding grief and loss issues, validated feelings, discussed strengths patient has used in previous adversity including her faith, began to explore ways she can use strengths   Plan: Return again in 2 weeks.            Diagnosis: Axis I: Major Depressive Disorder, Recurrent, Moderate    Axis II: No diagnosis    Alonza Smoker, LCSW 08/09/2019

## 2019-08-10 ENCOUNTER — Other Ambulatory Visit (HOSPITAL_COMMUNITY)
Admission: RE | Admit: 2019-08-10 | Discharge: 2019-08-10 | Disposition: A | Discharge status: Home / Self Care | Payer: BC Managed Care – PPO | Source: Ambulatory Visit | Attending: Internal Medicine | Admitting: Internal Medicine

## 2019-08-10 ENCOUNTER — Other Ambulatory Visit: Payer: Self-pay

## 2019-08-10 ENCOUNTER — Encounter: Payer: Self-pay | Admitting: Internal Medicine

## 2019-08-10 DIAGNOSIS — I10 Essential (primary) hypertension: Secondary | ICD-10-CM | POA: Diagnosis not present

## 2019-08-10 DIAGNOSIS — E785 Hyperlipidemia, unspecified: Secondary | ICD-10-CM | POA: Diagnosis not present

## 2019-08-10 DIAGNOSIS — Z01812 Encounter for preprocedural laboratory examination: Secondary | ICD-10-CM | POA: Diagnosis not present

## 2019-08-10 DIAGNOSIS — Z20822 Contact with and (suspected) exposure to covid-19: Secondary | ICD-10-CM | POA: Diagnosis not present

## 2019-08-11 ENCOUNTER — Encounter: Payer: Self-pay | Admitting: Family Medicine

## 2019-08-11 LAB — CBC
HCT: 39.1 % (ref 35.0–45.0)
Hemoglobin: 12.6 g/dL (ref 11.7–15.5)
MCH: 26.3 pg — ABNORMAL LOW (ref 27.0–33.0)
MCHC: 32.2 g/dL (ref 32.0–36.0)
MCV: 81.5 fL (ref 80.0–100.0)
MPV: 9.7 fL (ref 7.5–12.5)
Platelets: 357 10*3/uL (ref 140–400)
RBC: 4.8 10*6/uL (ref 3.80–5.10)
RDW: 15.6 % — ABNORMAL HIGH (ref 11.0–15.0)
WBC: 7.1 10*3/uL (ref 3.8–10.8)

## 2019-08-11 LAB — COMPLETE METABOLIC PANEL WITH GFR
AG Ratio: 1.6 (calc) (ref 1.0–2.5)
ALT: 13 U/L (ref 6–29)
AST: 21 U/L (ref 10–35)
Albumin: 4.4 g/dL (ref 3.6–5.1)
Alkaline phosphatase (APISO): 69 U/L (ref 37–153)
BUN/Creatinine Ratio: 6 (calc) (ref 6–22)
BUN: 11 mg/dL (ref 7–25)
CO2: 32 mmol/L (ref 20–32)
Calcium: 9.5 mg/dL (ref 8.6–10.4)
Chloride: 100 mmol/L (ref 98–110)
Creat: 1.91 mg/dL — ABNORMAL HIGH (ref 0.50–0.99)
GFR, Est African American: 32 mL/min/{1.73_m2} — ABNORMAL LOW (ref >=60)
GFR, Est Non African American: 28 mL/min/{1.73_m2} — ABNORMAL LOW (ref >=60)
Globulin: 2.7 g/dL (calc) (ref 1.9–3.7)
Glucose, Bld: 85 mg/dL (ref 65–99)
Potassium: 3.7 mmol/L (ref 3.5–5.3)
Sodium: 140 mmol/L (ref 135–146)
Total Bilirubin: 0.6 mg/dL (ref 0.2–1.2)
Total Protein: 7.1 g/dL (ref 6.1–8.1)

## 2019-08-11 LAB — LIPID PANEL
Cholesterol: 143 mg/dL (ref <200)
HDL: 53 mg/dL (ref >=50)
LDL Cholesterol (Calc): 65 mg/dL (calc)
Non-HDL Cholesterol (Calc): 90 mg/dL (calc) (ref <130)
Total CHOL/HDL Ratio: 2.7 (calc) (ref <5.0)
Triglycerides: 186 mg/dL — ABNORMAL HIGH (ref <150)

## 2019-08-11 LAB — SARS CORONAVIRUS 2 (TAT 6-24 HRS): SARS Coronavirus 2: NEGATIVE

## 2019-08-12 ENCOUNTER — Ambulatory Visit: Payer: BC Managed Care – PPO | Attending: Internal Medicine

## 2019-08-12 DIAGNOSIS — Z23 Encounter for immunization: Secondary | ICD-10-CM

## 2019-08-12 NOTE — Progress Notes (Signed)
   Covid-19 Vaccination Clinic  Name:  Pamela Walters    MRN: [id number] DOB: 04/29/1960  08/12/2019  [name] was observed post Covid-19 immunization for 15 minutes without incident. She was provided with Vaccine Information Sheet and instruction to access the V-Safe system.   [name] was instructed to call 911 with any severe reactions post vaccine: Marland Kitchen Difficulty breathing  . Swelling of face and throat  . A fast heartbeat  . A bad rash all over body  . Dizziness and weakness   Immunizations Administered    Name Date Dose VIS Date Route   Pfizer COVID-19 Vaccine 08/12/2019 11:03 AM 0.3 mL 04/07/2019 Intramuscular   Manufacturer: [place]   Lot: [id number]   [place]: [id number]

## 2019-08-14 ENCOUNTER — Ambulatory Visit (AMBULATORY_SURGERY_CENTER): Payer: BC Managed Care – PPO | Admitting: Internal Medicine

## 2019-08-14 ENCOUNTER — Encounter: Payer: Self-pay | Admitting: Internal Medicine

## 2019-08-14 ENCOUNTER — Other Ambulatory Visit: Payer: Self-pay | Admitting: *Deleted

## 2019-08-14 ENCOUNTER — Other Ambulatory Visit: Payer: Self-pay

## 2019-08-14 VITALS — BP 129/64 | HR 73 | Temp 97.8°F | Resp 16 | Ht 63.0 in | Wt 167.0 lb

## 2019-08-14 DIAGNOSIS — Z86010 Personal history of colonic polyps: Secondary | ICD-10-CM | POA: Diagnosis not present

## 2019-08-14 DIAGNOSIS — D127 Benign neoplasm of rectosigmoid junction: Secondary | ICD-10-CM

## 2019-08-14 DIAGNOSIS — K635 Polyp of colon: Secondary | ICD-10-CM

## 2019-08-14 DIAGNOSIS — Z1211 Encounter for screening for malignant neoplasm of colon: Secondary | ICD-10-CM | POA: Diagnosis not present

## 2019-08-14 MED ORDER — PANTOPRAZOLE SODIUM 40 MG PO TBEC: 40.0000 mg | DELAYED_RELEASE_TABLET | Freq: Two times a day (BID) | ORAL | 3 refills | Status: DC

## 2019-08-14 MED ORDER — SODIUM CHLORIDE 0.9 % IV SOLN: 500.0000 mL | INTRAVENOUS | Status: DC

## 2019-08-14 NOTE — Op Note (Signed)
[facility] Patient Name: Pamela Walters Procedure Date: 08/14/2019 1:31 PM MRN: [id number] Endoscopist: Jerene Bears , MD Age: 59 Referring MD:  Date of Birth: 31-Mar-1961 Gender: Female Account #: [id number] Procedure:                Colonoscopy Indications:              High risk colon cancer surveillance: Personal                            history of non-advanced adenoma, Personal history                            of sessile serrated colon polyps (less than 10 mm                            in size) with no dysplasia, Last colonoscopy 3                            years ago Medicines:                Monitored Anesthesia Care Procedure:                Pre-Anesthesia Assessment:                           - Prior to the procedure, a History and Physical                            was performed, and patient medications and                            allergies were reviewed. The patient's tolerance of                            previous anesthesia was also reviewed. The risks                            and benefits of the procedure and the sedation                            options and risks were discussed with the patient.                            All questions were answered, and informed consent                            was obtained. Prior Anticoagulants: The patient has                            taken no previous anticoagulant or antiplatelet                            agents. ASA Grade Assessment: II - A patient with  mild systemic disease. After reviewing the risks                            and benefits, the patient was deemed in                            satisfactory condition to undergo the procedure.                           After obtaining informed consent, the colonoscope                            was passed under direct vision. Throughout the                            procedure, the patient's blood pressure, pulse, and                    oxygen saturations were monitored continuously. The                            Colonoscope was introduced through the anus and                            advanced to the cecum, identified by appendiceal                            orifice and ileocecal valve. The colonoscopy was                            performed without difficulty. The patient tolerated                            the procedure well. The quality of the bowel                            preparation was good. The ileocecal valve,                            appendiceal orifice, and rectum were photographed. Scope In: 1:49:12 PM Scope Out: 2:06:29 PM Scope Withdrawal Time: 0 hours 10 minutes 25 seconds  Total Procedure Duration: 0 hours 17 minutes 17 seconds  Findings:                 The digital rectal exam was normal.                           A 4 mm polyp was found in the recto-sigmoid colon.                            The polyp was sessile. The polyp was removed with a                            cold snare. Resection and retrieval were complete.  A few small-mouthed diverticula were found in the                            sigmoid colon and descending colon.                           Internal hemorrhoids were found during                            retroflexion. The hemorrhoids were small. Complications:            No immediate complications. Estimated Blood Loss:     Estimated blood loss was minimal. Impression:               - One 4 mm polyp at the recto-sigmoid colon,                            removed with a cold snare. Resected and retrieved.                           - Diverticulosis in the sigmoid colon and in the                            descending colon.                           - Small internal hemorrhoids. Recommendation:           - Patient has a contact number available for                            emergencies. The signs and symptoms of potential                             delayed complications were discussed with the                            patient. Return to normal activities tomorrow.                            Written discharge instructions were provided to the                            patient.                           - Resume previous diet.                           - Continue present medications.                           - Await pathology results.                           - Repeat colonoscopy is recommended for  surveillance. The colonoscopy date will be                            determined after pathology results from today's                            exam become available for review.                           - Prior to procedure patient reports RUQ pain and                            recurrent dysphagia symptom. Please proceed with                            abdominal US (attention to hyperechoic lesion                            adjacent to gallbladder seen in Dec 2019) and if                            unremarkable then likely EGD. Continue twice daily                            pantoprazole 40 mg before meals for now. Jerene Bears, MD 08/14/2019 2:15:19 PM This report has been signed electronically.

## 2019-08-14 NOTE — Patient Instructions (Signed)
Handouts provided:  Polyps  YOU HAD AN ENDOSCOPIC PROCEDURE TODAY AT THE [facility]:   Refer to the procedure report that was given to you for any specific questions about what was found during the examination.  If the procedure report does not answer your questions, please call your gastroenterologist to clarify.  If you requested that your care partner not be given the details of your procedure findings, then the procedure report has been included in a sealed envelope for you to review at your convenience later.  YOU SHOULD EXPECT: Some feelings of bloating in the abdomen. Passage of more gas than usual.  Walking can help get rid of the air that was put into your GI tract during the procedure and reduce the bloating. If you had a lower endoscopy (such as a colonoscopy or flexible sigmoidoscopy) you may notice spotting of blood in your stool or on the toilet paper. If you underwent a bowel prep for your procedure, you may not have a normal bowel movement for a few days.  Please Note:  You might notice some irritation and congestion in your nose or some drainage.  This is from the oxygen used during your procedure.  There is no need for concern and it should clear up in a day or so.  SYMPTOMS TO REPORT IMMEDIATELY:   Following lower endoscopy (colonoscopy or flexible sigmoidoscopy):  Excessive amounts of blood in the stool  Significant tenderness or worsening of abdominal pains  Swelling of the abdomen that is new, acute  Fever of 100F or higher  For urgent or emergent issues, a gastroenterologist can be reached at any hour by calling [phone]. Do not use MyChart messaging for urgent concerns.    DIET:  We do recommend a small meal at first, but then you may proceed to your regular diet.  Drink plenty of fluids but you should avoid alcoholic beverages for 24 hours.  ACTIVITY:  You should plan to take it easy for the rest of today and you should NOT DRIVE or use heavy  machinery until tomorrow (because of the sedation medicines used during the test).    FOLLOW UP: Our staff will call the number listed on your records 48-72 hours following your procedure to check on you and address any questions or concerns that you may have regarding the information given to you following your procedure. If we do not reach you, we will leave a message.  We will attempt to reach you two times.  During this call, we will ask if you have developed any symptoms of COVID 19. If you develop any symptoms (ie: fever, flu-like symptoms, shortness of breath, cough etc.) before then, please call [phone].  If you test positive for Covid 19 in the 2 weeks post procedure, please call and report this information to us.    If any biopsies were taken you will be contacted by phone or by letter within the next 1-3 weeks.  Please call us at [phone] if you have not heard about the biopsies in 3 weeks.    SIGNATURES/CONFIDENTIALITY: You and/or your care partner have signed paperwork which will be entered into your electronic medical record.  These signatures attest to the fact that that the information above on your After Visit Summary has been reviewed and is understood.  Full responsibility of the confidentiality of this discharge information lies with you and/or your care-partner.  

## 2019-08-14 NOTE — Progress Notes (Signed)
Called to room to assist during endoscopic procedure.  Patient ID and intended procedure confirmed with present staff. Received instructions for my participation in the procedure from the performing physician.  

## 2019-08-14 NOTE — Progress Notes (Signed)
Pt's states no medical or surgical changes since previsit or office visit. 

## 2019-08-15 ENCOUNTER — Other Ambulatory Visit: Payer: Self-pay

## 2019-08-15 ENCOUNTER — Telehealth: Payer: Self-pay

## 2019-08-15 DIAGNOSIS — R1011 Right upper quadrant pain: Secondary | ICD-10-CM

## 2019-08-15 NOTE — Telephone Encounter (Signed)
Pt scheduled for [place] of abd at [facility] 08/25/19@9am , pt to arrive there at 8:45am and be NPO after midnight. Pt aware of appt.

## 2019-08-16 ENCOUNTER — Telehealth: Payer: Self-pay

## 2019-08-16 ENCOUNTER — Telehealth: Payer: Self-pay | Admitting: *Deleted

## 2019-08-16 NOTE — Telephone Encounter (Signed)
Follow up call attempted. unable to leave message, voicemail was full.

## 2019-08-16 NOTE — Telephone Encounter (Signed)
  Follow up Call-  Call back number 08/14/2019  Post procedure Call Back phone  # [phone] cell  Permission to leave phone message Yes  Some recent data might be hidden     Patient questions:  Do you have a fever, pain , or abdominal swelling? No. Pain Score  0 *  Have you tolerated food without any problems? Yes.    Have you been able to return to your normal activities? Yes.    Do you have any questions about your discharge instructions: Diet   No. Medications  No. Follow up visit  No.  Do you have questions or concerns about your Care? No.  Actions: * If pain score is 4 or above: No action needed, pain <4.  1. Have you developed a fever since your procedure? no  2.   Have you had an respiratory symptoms (SOB or cough) since your procedure? no  3.   Have you tested positive for COVID 19 since your procedure no   4.   Have you had any family members/close contacts diagnosed with the COVID 19 since your procedure?  no   If yes to any of these questions please route to Joylene John, RN and Erenest Rasher, RN

## 2019-08-18 ENCOUNTER — Encounter: Payer: Self-pay | Admitting: Internal Medicine

## 2019-08-22 ENCOUNTER — Telehealth: Payer: Self-pay | Admitting: *Deleted

## 2019-08-22 NOTE — Telephone Encounter (Signed)
Levin Erp Key: [id number] - PA Case ID: [id number] Need help? Call us at [phone] Outcome Approvedon April 23 Request Reference Number: [id number]. PANTOPRAZOLE TAB 40MG  is approved through 08/17/2020. Your patient may now fill this prescription and it will be covered. Drug Pantoprazole Sodium 40MG  dr tablets Form [facility] Electronic Prior Authorization Form [id number] NCPDP)

## 2019-08-23 ENCOUNTER — Telehealth (HOSPITAL_COMMUNITY): Payer: BC Managed Care – PPO | Admitting: Psychiatry

## 2019-08-25 ENCOUNTER — Other Ambulatory Visit: Payer: Self-pay

## 2019-08-25 ENCOUNTER — Ambulatory Visit (HOSPITAL_COMMUNITY)
Admission: RE | Admit: 2019-08-25 | Discharge: 2019-08-25 | Disposition: A | Discharge status: Home / Self Care | Payer: BC Managed Care – PPO | Source: Ambulatory Visit | Attending: Internal Medicine | Admitting: Internal Medicine

## 2019-08-25 DIAGNOSIS — R1011 Right upper quadrant pain: Secondary | ICD-10-CM | POA: Insufficient documentation

## 2019-08-25 DIAGNOSIS — K828 Other specified diseases of gallbladder: Secondary | ICD-10-CM | POA: Diagnosis not present

## 2019-08-30 ENCOUNTER — Telehealth (HOSPITAL_COMMUNITY): Payer: BC Managed Care – PPO | Admitting: Psychiatry

## 2019-09-05 ENCOUNTER — Ambulatory Visit: Payer: BC Managed Care – PPO | Attending: Internal Medicine

## 2019-09-05 DIAGNOSIS — Z23 Encounter for immunization: Secondary | ICD-10-CM

## 2019-09-05 NOTE — Progress Notes (Signed)
   Covid-19 Vaccination Clinic  Name:  Pamela Walters    MRN: [id number] DOB: [birth year]  09/05/2019  [name] was observed post Covid-19 immunization for 15 minutes without incident. She was provided with Vaccine Information Sheet and instruction to access the V-Safe system.   [name] was instructed to call 911 with any severe reactions post vaccine: Marland Kitchen Difficulty breathing  . Swelling of face and throat  . A fast heartbeat  . A bad rash all over body  . Dizziness and weakness   Immunizations Administered    Name Date Dose VIS Date Route   Pfizer COVID-19 Vaccine 09/05/2019  4:50 PM 0.3 mL 06/21/2018 Intramuscular   Manufacturer: [employer], [employer]   Lot: [id number]   [place]: [id number]

## 2019-09-06 ENCOUNTER — Telehealth (INDEPENDENT_AMBULATORY_CARE_PROVIDER_SITE_OTHER): Payer: BC Managed Care – PPO | Admitting: Psychiatry

## 2019-09-06 ENCOUNTER — Other Ambulatory Visit: Payer: Self-pay

## 2019-09-06 ENCOUNTER — Encounter (HOSPITAL_COMMUNITY): Payer: Self-pay | Admitting: Psychiatry

## 2019-09-06 DIAGNOSIS — F331 Major depressive disorder, recurrent, moderate: Secondary | ICD-10-CM | POA: Diagnosis not present

## 2019-09-06 MED ORDER — DULOXETINE HCL 60 MG PO CPEP: 120.0000 mg | ORAL_CAPSULE | Freq: Every day | ORAL | 2 refills | Status: DC

## 2019-09-06 MED ORDER — BUPROPION HCL ER (XL) 150 MG PO TB24: 150.0000 mg | ORAL_TABLET | ORAL | 2 refills | Status: DC

## 2019-09-06 MED ORDER — PRAZOSIN HCL 1 MG PO CAPS: 1.0000 mg | ORAL_CAPSULE | Freq: Every day | ORAL | 2 refills | Status: DC

## 2019-09-06 NOTE — Progress Notes (Signed)
Virtual Visit via Video Note  I connected with Pamela Walters on 09/06/19 at 11:40 AM EDT by a video enabled telemedicine application and verified that I am speaking with the correct person using two identifiers.   I discussed the limitations of evaluation and management by telemedicine and the availability of in person appointments. The patient expressed understanding and agreed to proceed.    I discussed the assessment and treatment plan with the patient. The patient was provided an opportunity to ask questions and all were answered. The patient agreed with the plan and demonstrated an understanding of the instructions.   The patient was advised to call back or seek an in-person evaluation if the symptoms worsen or if the condition fails to improve as anticipated.  I provided 15 minutes of non-face-to-face time during this encounter.   [name], MD  [facility] MD/PA/NP OP Progress Note  09/06/2019 11:53 AM Pamela Walters  MRN:  [id number]  Chief Complaint:  Chief Complaint    Depression; Follow-up     HPI: This patient is a 59 year old married white female who lives with her husband in [place].  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  Last time she was very fatigued and having low motivation and sleeping all the time.  I have added Wellbutrin to her regimen it seems to have made a big difference.  She states that her energy is improved and she does not sleep as much and feels a lot more like doing things.  She states her mood is improved as well.  She did have her labs checked and her creatinine is a little bit high and she thinks this is from using so much ibuprofen for chronic pain.  It sounds as if she has fibromyalgia because she has aches and pains all the time.  However she is satisfied with the addition of Wellbutrin and thinks it in the general level of function is much  improved Visit Diagnosis:    ICD-10-CM   1. Major depressive disorder, recurrent episode, moderate (HCC)  F33.1     Past Psychiatric History:none  Past Medical History:  Past Medical History:  Diagnosis Date  . Allergy   . Anemia   . Anxiety   . Arthritis   . Back pain   . Bell's palsy 08/2017  . Depression   . GERD (gastroesophageal reflux disease)   . Hepatic steatosis   . Hiatal hernia   . History of Bell's palsy 02/11/2018  . Hyperlipidemia   . Hypertension   . Internal hemorrhoids   . MS (multiple sclerosis) ([place])    2007  . Multiple sclerosis ([place])   . Multiple sclerosis ([place]) 04/04/2008   Qualifier: Diagnosis of  By: Moshe Cipro MD, Joycelyn Schmid    . Schatzki's ring   . Sleep apnea    wears CPAP    Past Surgical History:  Procedure Laterality Date  . ABDOMINAL HYSTERECTOMY  2005   fibroids  . CARDIAC CATHETERIZATION N/A 05/04/2016   Procedure: Left Heart Cath and Coronary Angiography;  Surgeon: Jettie Booze, MD;  Location: [facility];  Service: Cardiovascular;  Laterality: N/A;  . COLONOSCOPY    . ESOPHAGOGASTRODUODENOSCOPY N/A 09/18/2013   Procedure: ESOPHAGOGASTRODUODENOSCOPY (EGD);  Surgeon: Jerene Bears, MD;  Location: [place];  Service: Gastroenterology;  Laterality: N/A;  . UPPER GASTROINTESTINAL ENDOSCOPY    . WISDOM TOOTH EXTRACTION  Family Psychiatric History: see below  Family History:  Family History  Problem Relation Age of Onset  . Hypertension Mother   . Hyperlipidemia Mother   . Depression Mother   . Hypertension Father   . Colon cancer Neg Hx   . Esophageal cancer Neg Hx   . Rectal cancer Neg Hx   . Stomach cancer Neg Hx   . Colon polyps Neg Hx     Social History:  Social History   Socioeconomic History  . Marital status: Married    Spouse name: Not on file  . Number of children: Not on file  . Years of education: Not on file  . Highest education level: Not on file  Occupational History  . Not on file   Tobacco Use  . Smoking status: Never Smoker  . Smokeless tobacco: Never Used  Substance and Sexual Activity  . Alcohol use: No  . Drug use: No  . Sexual activity: Yes    Birth control/protection: Surgical  Other Topics Concern  . Not on file  Social History Narrative  . Not on file   Social Determinants of Health   Financial Resource Strain:   . Difficulty of Paying Living Expenses:   Food Insecurity:   . Worried About Charity fundraiser in the Last Year:   . Arboriculturist in the Last Year:   Transportation Needs:   . Film/video editor (Medical):   Marland Kitchen Lack of Transportation (Non-Medical):   Physical Activity:   . Days of Exercise per Week:   . Minutes of Exercise per Session:   Stress:   . Feeling of Stress :   Social Connections:   . Frequency of Communication with Friends and Family:   . Frequency of Social Gatherings with Friends and Family:   . Attends Religious Services:   . Active Member of Clubs or Organizations:   . Attends Archivist Meetings:   Marland Kitchen Marital Status:     Allergies: No Known Allergies  Metabolic Disorder Labs: Lab Results  Component Value Date   HGBA1C 5.5 05/10/2017   MPG 111 05/10/2017   MPG 108 02/20/2016   No results found for: PROLACTIN Lab Results  Component Value Date   CHOL 143 08/10/2019   TRIG 186 (H) 08/10/2019   HDL 53 08/10/2019   CHOLHDL 2.7 08/10/2019   VLDL 34 (H) 06/30/2016   LDLCALC 65 08/10/2019   LDLCALC 45 01/30/2019   Lab Results  Component Value Date   TSH 2.36 01/30/2019   TSH 2.36 05/10/2017    Therapeutic Level Labs: No results found for: LITHIUM No results found for: VALPROATE No components found for:  CBMZ  Current Medications: Current Outpatient Medications  Medication Sig Dispense Refill  . amLODipine (NORVASC) 2.5 MG tablet Take 1 tablet by mouth once daily 30 tablet 3  . Armodafinil 250 MG tablet TAKE 1 TABLET BY MOUTH  DAILY 30 tablet 5  . aspirin 81 MG tablet Take 81 mg  by mouth daily.    Marland Kitchen buPROPion (WELLBUTRIN XL) 150 MG 24 hr tablet Take 1 tablet (150 mg total) by mouth every morning. 30 tablet 2  . clonazePAM (KLONOPIN) 0.5 MG tablet Take 1 tablet (0.5 mg total) by mouth daily as needed for anxiety. 30 tablet 2  . DULoxetine (CYMBALTA) 60 MG capsule Take 2 capsules (120 mg total) by mouth daily. 60 capsule 2  . ezetimibe (ZETIA) 10 MG tablet TAKE 1 TABLET BY MOUTH  DAILY 90 tablet  3  . gabapentin (NEURONTIN) 600 MG tablet Take 1 tablet (600 mg total) by mouth 3 (three) times daily. 270 tablet 3  . metaxalone (SKELAXIN) 800 MG tablet Take 1 tablet (800 mg total) by mouth 3 (three) times daily. 90 tablet 3  . mirabegron ER (MYRBETRIQ) 25 MG TB24 tablet Take 1 tablet (25 mg total) by mouth daily. 30 tablet 3  . mometasone (NASONEX) 50 MCG/ACT nasal spray USE 2 SPRAYS NASALLY DAILY 51 g 1  . Multiple Vitamin (MULTIVITAMIN WITH MINERALS) TABS tablet Take 1 tablet by mouth daily.    . pantoprazole (PROTONIX) 40 MG tablet Take 1 tablet (40 mg total) by mouth 2 (two) times daily. 180 tablet 3  . prazosin (MINIPRESS) 1 MG capsule Take 1 capsule (1 mg total) by mouth at bedtime. 30 capsule 2  . rosuvastatin (CRESTOR) 40 MG tablet TAKE 1 TABLET BY MOUTH  DAILY 90 tablet 3  . traMADol (ULTRAM) 50 MG tablet TAKE 1 TO 2 TABLETS BY  MOUTH UP TO 4 PILLS DAILY  AS NEEDED , BUT NO MORE THAN 90 TABLETS PER MONTH 270 tablet 1  . triamterene-hydrochlorothiazide (MAXZIDE-25) 37.5-25 MG tablet TAKE 1 TABLET BY MOUTH  DAILY 90 tablet 2   No current facility-administered medications for this visit.   Facility-Administered Medications Ordered in Other Visits  Medication Dose Route Frequency Provider Last Rate Last Admin  . gadopentetate dimeglumine (MAGNEVIST) injection 20 mL  20 mL Intravenous Once PRN Sater, Nanine Means, MD         Musculoskeletal: Strength & Muscle Tone: within normal limits Gait & Station: normal Patient leans: N/A  Psychiatric Specialty Exam: Review of  Systems  Musculoskeletal: Positive for myalgias.  All other systems reviewed and are negative.   There were no vitals taken for this visit.There is no height or weight on file to calculate BMI.  General Appearance: Casual and Fairly Groomed  Eye Contact:  Good  Speech:  Clear and Coherent  Volume:  Normal  Mood:  Euthymic  Affect:  NA and Congruent  Thought Process:  Goal Directed  Orientation:  Full (Time, Place, and Person)  Thought Content: WDL   Suicidal Thoughts:  No  Homicidal Thoughts:  No  Memory:  Immediate;   Good Recent;   Good Remote;   Fair  Judgement:  Good  Insight:  Good  Psychomotor Activity:  Normal  Concentration:  Concentration: Good and Attention Span: Good  Recall:  Good  Fund of Knowledge: Good  Language: Good  Akathisia:  No  Handed:  Right  AIMS (if indicated): not done  Assets:  Communication Skills Desire for Improvement Resilience Social Support Talents/Skills  ADL's:  Intact  Cognition: WNL  Sleep:  Good   Screenings: PHQ2-9     Office Visit from 06/20/2019 in [facility] Office Visit from 01/30/2019 in [facility] from 06/01/2018 in [facility] Office Visit from 03/07/2018 in [facility] Office Visit from 01/05/2018 in [facility]  PHQ-2 Total Score  4  4  4  2  3   PHQ-9 Total Score  12  15  16  10  12        Assessment and Plan: This patient is a 59 year old female with a history of depression anxiety and probable chronic fatigue and fibromyalgia.  She is doing better since we added Wellbutrin.  She will continue Wellbutrin XL 150 mg daily as well as Cymbalta 120 mg daily for depression.  She will  continue prazosin 1 mg at bedtime for nightmares and clonazepam 0.5 mg daily as needed for anxiety.  She states that she rarely needs to clonazepam.  She will return to see me in 3 months   [name], MD 09/06/2019, 11:53 AM

## 2019-09-07 DIAGNOSIS — I129 Hypertensive chronic kidney disease with stage 1 through stage 4 chronic kidney disease, or unspecified chronic kidney disease: Secondary | ICD-10-CM | POA: Diagnosis not present

## 2019-09-07 DIAGNOSIS — K229 Disease of esophagus, unspecified: Secondary | ICD-10-CM | POA: Diagnosis not present

## 2019-09-07 DIAGNOSIS — K219 Gastro-esophageal reflux disease without esophagitis: Secondary | ICD-10-CM | POA: Diagnosis not present

## 2019-09-07 DIAGNOSIS — N179 Acute kidney failure, unspecified: Secondary | ICD-10-CM | POA: Diagnosis not present

## 2019-09-12 ENCOUNTER — Other Ambulatory Visit (HOSPITAL_COMMUNITY): Payer: Self-pay | Admitting: Psychiatry

## 2019-09-12 MED ORDER — DULOXETINE HCL 60 MG PO CPEP: 120.0000 mg | ORAL_CAPSULE | Freq: Every day | ORAL | 2 refills | Status: DC

## 2019-09-13 ENCOUNTER — Ambulatory Visit (AMBULATORY_SURGERY_CENTER): Payer: BC Managed Care – PPO | Admitting: *Deleted

## 2019-09-13 ENCOUNTER — Other Ambulatory Visit: Payer: Self-pay

## 2019-09-13 ENCOUNTER — Encounter: Payer: Self-pay | Admitting: Neurology

## 2019-09-13 ENCOUNTER — Ambulatory Visit: Payer: BC Managed Care – PPO | Admitting: Neurology

## 2019-09-13 VITALS — Ht 63.0 in | Wt 162.0 lb

## 2019-09-13 VITALS — BP 112/70 | HR 84 | Ht 63.0 in | Wt 169.5 lb

## 2019-09-13 DIAGNOSIS — G4733 Obstructive sleep apnea (adult) (pediatric): Secondary | ICD-10-CM | POA: Diagnosis not present

## 2019-09-13 DIAGNOSIS — G4489 Other headache syndrome: Secondary | ICD-10-CM | POA: Diagnosis not present

## 2019-09-13 DIAGNOSIS — Z8669 Personal history of other diseases of the nervous system and sense organs: Secondary | ICD-10-CM

## 2019-09-13 DIAGNOSIS — R9089 Other abnormal findings on diagnostic imaging of central nervous system: Secondary | ICD-10-CM

## 2019-09-13 DIAGNOSIS — M542 Cervicalgia: Secondary | ICD-10-CM | POA: Diagnosis not present

## 2019-09-13 DIAGNOSIS — R4589 Other symptoms and signs involving emotional state: Secondary | ICD-10-CM | POA: Insufficient documentation

## 2019-09-13 DIAGNOSIS — R1310 Dysphagia, unspecified: Secondary | ICD-10-CM

## 2019-09-13 MED ORDER — TRAMADOL HCL 50 MG PO TABS: ORAL_TABLET | ORAL | 1 refills | Status: DC

## 2019-09-13 MED ORDER — ARMODAFINIL 250 MG PO TABS: 250.0000 mg | ORAL_TABLET | Freq: Every day | ORAL | 1 refills | Status: DC

## 2019-09-13 NOTE — Progress Notes (Signed)
PV completed virtually. No egg or soy allergy known to patient  No issues with past sedation with any surgeries  or procedures, no intubation problems  No diet pills per patient No home 02 use per patient  No blood thinners per patient  Pt denies issues with constipation  No A fib or A flutter  EMMI video sent to pt's e mail   Due to the COVID-19 pandemic we are asking patients to follow these guidelines. Please only bring one care partner. Please be aware that your care partner may wait in the car in the parking lot or if they feel like they will be too hot to wait in the car, they may wait in the lobby on the 4th floor. All care partners are required to wear a mask the entire time (we do not have any that we can provide them), they need to practice social distancing, and we will do a Covid check for all patient's and care partners when you arrive. Also we will check their temperature and your temperature. If the care partner waits in their car they need to stay in the parking lot the entire time and we will call them on their cell phone when the patient is ready for discharge so they can bring the car to the front of the building. Also all patient's will need to wear a mask into building.

## 2019-09-13 NOTE — Progress Notes (Signed)
[facility]  PATIENT: Pamela Walters DOB: [birth year]  REFERRING CLINICIAN: [name] HISTORY FROM: Patient REASON FOR VISIT: Possible MS, fatigue, myalgias   HISTORICAL  CHIEF COMPLAINT:  Chief Complaint  Patient presents with  . Follow-up    RM 12., alone. Last seen 02/11/2018. Has hx Bells Palsy (May 2019).   . Multiple Sclerosis    Off DMT.    Marland Kitchen Headache    Takes excedrin prn. Has a constant headache.   . neck/back pain    Having more pain  . Cpap    Takes armodafinil for daytime sleepiness. Something is wrong with her CPAP machine. DME: [employer]. She has not contacted them about this. I provided her their contact info to f/u with them about this. Last download from resmed only goes through 06/28/2019. Pt confirmed this is when she stopped using it.    HISTORY OF PRESENT ILLNESS:  She is a 59 y.o. woman who has headache, possible MS and obstructive sleep apnea  Update 09/13/2019: She is noting that she is very fidgety.   She is not on any neuroleptics or related medications.  She is on Wellbutrin but symptoms started before that was added last year.    She is also on duloxetine 120 mg daily.    In the past, she was diagnosed with MS and was on DMTs for a while (none since 2011).   MRI shows nonspecific foci.     She hasn't felt well with more fatigue and more headaches.     She has daily dull headache but migraines are uncommon (< 1/month).   In the past a splenius capitus injection helped.    She has pain and she takes gabapentin 600 mg po tid with some benefit.   NSAIDs were affecting kidneys and they were stopped.    Creatinine is still 1.9   OSA   She has reduced compliance (download today shows 27% use;  Only 7% use > 4 hrs.     AHI is good at 4.1.  She feels her machine is not working as well.     PSG was performed 08/20/2014.   She uses [facility]        Update 02/11/2018: She has possible MS (was diagnosed in past and on DMTs for a  while):   She has been off of DMTs since 2011.   MRI shows nonspecific foci.     She hasn't felt well with more fatigue and more headaches.     She gets migraines associated with N/V vertigo.    She gets one severe migraine a month.   She gets milder dull ache on the left side.     She had Bells palsy Sep 11, 2017.   She went to the ED and was told she had Bells palsy.   Valtrex and prednisone was started.  She started improving in 3 weeks and is now 100% back to baseline.     OSA   She has reduced compliance (download today shows 50% use; 40% use > 4 hrs).   She often falls asleep before the mask is own.   She takes 600 mg gabapentin and prazosin.   She feels more energetic when she wears it the whole night.   She uses a nasal mask and likes her mask and machine.     Update 05/19/2017: She denies any new ne.   urologic symptoms/exacerbations. Last MRI performed 03/27/2015 was unchanged compared to the MRI from 02/09/2013. She has  mild difficulty with her balance sometimes veering to the left or right while she walks. She denies any significant weakness. She has noted hand numbness the past few months.   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    She wore wrist splints for possible CTS years ago but never had any NCV/EMG.    She will sometimes have tingling helped by gabapentin.. She has urinary frequency and urgency that is helped by oxybutynin. She denies any difficulty with her vision.  She notes some fatigue.  Usually her fatigue will be worse when the temperatures are elevated. She has severe obstructive sleep apnea (AHI was 37) controlled on CPAP +9 cm.   She uses nightly for > 4 hours almost every night.   However, since she does third shifts, she has variable sleep.   Nuvigil helps at least 8 hours.   She was having more depression a couple months ago and rare anxiety attacks.   She was prescribed clonazepam but has not taken recently.    From 09/02/2016: MS:   She has been off  of any disease modifying therapy since 2011 or so.    Her last MRI 03/27/2015 was unchanged compared to the MRI 02/09/2013.   She does not believe she has had any exacerbations.  Gait/strength/sensation:   She feels gait is the same -- she is mildly of balanced and veers some with walking.   Strength is good and she denies any significant spasticity.  She notes no numbness but has some painful tingling in the legs at times    Bladder:  She has bladder frequency and urgency helped by oxybutynin.   She tolerates it well.     Vision:   She feels vision is stable.      She wears glasses.     No h/o optic neuritis  OSA/sleep:  She has severe OSA with an AHI equal 37. She is on CPAP +9 cm. She has been using CPAP every night but not always for the entire night.      When she wears CPAP the whole night, she does feel less tired the next day.  She sometimes falls asleep outside of the bed and then doesn't use CPAP the whole night.   She has also noted less nocturia when she uses CPAP.   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   She is sometimes sleepy during the day. time        Fatigue:   She notes fatigue many days even if not sleepy.   The fatigue is both physical and mental.   Fatigue is always worse when she is hot.    Fatigue has been a little better since starting CPAP.      Mood/cognitive:   She has had mood swings and some irritability but no crying spells.  She sees Dr. Sima Matas.   She is gaining weight on Abilify but current regimen is helping mood a lot.   She notes decreased focus and attention.    Pain:   She reports myalgias in the legs. She gets some benefit from Skelaxin and hydrocodone and gabapentin.     She tolerates these well.   She has seen Dr. Rolena Infante and Dr. Nelva Bush for Pain.  She also has LBP from facet hypertrophy and spondylolisthesis at L4L5.     ESI injections have helped in past.    LBP is better  MS History: In  2006 and 2007 she presented  with headaches. Headaches were  bilateral and were associated with visual aura. Pain was throbbing and she had associated photophobia and phonophobia.  Mrs. Keirsey underwent an MRI of the brain and an MR angiogram. The angiogram was normal but the MRI of the brain showed multiple white matter foci in a nonspecific pattern. She reports that Dr. Doy Mince did a lumbar puncture and she was told that the results were abnormal, consistent with MS. She was placed on Betaseron but had difficulties tolerating it. She has not been on any DMT for her MS over the last 4 - 5 years.  She did not have any definite exacerbations.  Specifically, there were no episodes of numbness, weakness, clumsiness or visual change that came on over a short period of time.  A repeat MRI of the brain showed that the might of been some progression of the MRI changes. An MRI of the thoracic spine did not show any spinal lesions.    DATA :   I reviewed her MRI images from 12//2016.  She has multiple white matter lesions and the majority are round and either subcortical or in the deep white matter.  A couple smaller ones are in the periventricular white matter.   There is symmetric hyperintensity in the mid midbrain.   I reviewed recent labwork showing normal vasculitis labs but elevated homocysteine.     MRI of the thoracic spine at that time showed no spinal cord lesions. MRI of the lumbar spine shows degenerative changes at a worse at L4-L5. She has 4 mm of anterolisthesis at that level    REVIEW OF SYSTEMS:  Constitutional: No fevers, chills, sweats, or change in appetite.   She has fatigue Eyes: No double vision,.   She notes eye pain Ear, nose and throat: No hearing loss, ear pain, nasal congestion, sore throat Cardiovascular: No chest pain, palpitations Respiratory:  No shortness of breath at rest or with exertion.   No wheezes GastrointestinaI: No nausea, vomiting, diarrhea, abdominal pain, fecal incontinence.   She has a hiatal hernia.  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   Genitourinary:  reports frequency and has 2 x nocturia.  No nocturia. Musculoskeletal:  No neck pain.  She reports lower back pain and has L4L5 DJD Integumentary: No rash, pruritus, skin lesions Neurological: as above Psychiatric: No depression at this time.  No anxiety Endocrine: Her weight is stable.   She has had hot flashes recently Hematologic/Lymphatic:  No anemia, purpura, petechiae. Allergic/Immunologic: No itchy/runny eyes, nasal congestion, recent allergic reactions, rashes  ALLERGIES: No Known Allergies  HOME MEDICATIONS: Outpatient Medications Prior to Visit  Medication Sig Dispense Refill  . amLODipine (NORVASC) 10 MG tablet Take 10 mg by mouth daily.    Marland Kitchen aspirin 81 MG tablet Take 81 mg by mouth daily.    Marland Kitchen buPROPion (WELLBUTRIN XL) 150 MG 24 hr tablet Take 1 tablet (150 mg total) by mouth every morning. 30 tablet 2  . clonazePAM (KLONOPIN) 0.5 MG tablet Take 1 tablet (0.5 mg total) by mouth daily as needed for anxiety. 30 tablet 2  . DULoxetine (CYMBALTA) 60 MG capsule Take 2 capsules (120 mg total) by mouth daily. 180 capsule 2  . ezetimibe (ZETIA) 10 MG tablet TAKE 1 TABLET BY MOUTH  DAILY 90 tablet 3  . gabapentin (NEURONTIN) 600 MG tablet Take 1 tablet (600 mg total) by mouth 3 (three) times daily. 270 tablet 3  . metaxalone (SKELAXIN) 800 MG tablet Take 1 tablet (  800 mg total) by mouth 3 (three) times daily. 90 tablet 3  . mirabegron ER (MYRBETRIQ) 25 MG TB24 tablet Take 1 tablet (25 mg total) by mouth daily. 30 tablet 3  . mometasone (NASONEX) 50 MCG/ACT nasal spray USE 2 SPRAYS NASALLY DAILY 51 g 1  . Multiple Vitamin (MULTIVITAMIN WITH MINERALS) TABS tablet Take 1 tablet by mouth daily.    . pantoprazole (PROTONIX) 40 MG tablet Take 1 tablet (40 mg total) by mouth 2 (two) times daily. 180 tablet 3  . prazosin (MINIPRESS) 1 MG capsule Take 1 capsule (1 mg total) by mouth at bedtime. 30 capsule 2  . rosuvastatin (CRESTOR) 40 MG tablet  TAKE 1 TABLET BY MOUTH  DAILY 90 tablet 3  . Armodafinil 250 MG tablet TAKE 1 TABLET BY MOUTH  DAILY 30 tablet 5  . traMADol (ULTRAM) 50 MG tablet TAKE 1 TO 2 TABLETS BY  MOUTH UP TO 4 PILLS DAILY  AS NEEDED , BUT NO MORE THAN 90 TABLETS PER MONTH 270 tablet 1  . amLODipine (NORVASC) 2.5 MG tablet Take 1 tablet by mouth once daily 30 tablet 3  . triamterene-hydrochlorothiazide (MAXZIDE-25) 37.5-25 MG tablet TAKE 1 TABLET BY MOUTH  DAILY 90 tablet 2   Facility-Administered Medications Prior to Visit  Medication Dose Route Frequency Provider Last Rate Last Admin  . gadopentetate dimeglumine (MAGNEVIST) injection 20 mL  20 mL Intravenous Once PRN Mare Ludtke, Nanine Means, MD        PAST MEDICAL HISTORY: Past Medical History:  Diagnosis Date  . Allergy   . Anemia   . Anxiety   . Arthritis   . Back pain   . Bell's palsy 08/2017  . Depression   . GERD (gastroesophageal reflux disease)   . Hepatic steatosis   . Hiatal hernia   . History of Bell's palsy 02/11/2018  . Hyperlipidemia   . Hypertension   . Internal hemorrhoids   . MS (multiple sclerosis) ([place])    2007  . Multiple sclerosis ([place])   . Multiple sclerosis ([place]) 04/04/2008   Qualifier: Diagnosis of  By: Moshe Cipro MD, Joycelyn Schmid    . Schatzki's ring   . Sleep apnea    wears CPAP    PAST SURGICAL HISTORY: Past Surgical History:  Procedure Laterality Date  . ABDOMINAL HYSTERECTOMY  2005   fibroids  . CARDIAC CATHETERIZATION N/A 05/04/2016   Procedure: Left Heart Cath and Coronary Angiography;  Surgeon: Jettie Booze, MD;  Location: [facility];  Service: Cardiovascular;  Laterality: N/A;  . COLONOSCOPY    . ESOPHAGOGASTRODUODENOSCOPY N/A 09/18/2013   Procedure: ESOPHAGOGASTRODUODENOSCOPY (EGD);  Surgeon: Jerene Bears, MD;  Location: [facility];  Service: Gastroenterology;  Laterality: N/A;  . UPPER GASTROINTESTINAL ENDOSCOPY    . WISDOM TOOTH EXTRACTION      FAMILY HISTORY: Family History  Problem Relation Age of  Onset  . Hypertension Mother   . Hyperlipidemia Mother   . Depression Mother   . Hypertension Father   . Colon cancer Neg Hx   . Esophageal cancer Neg Hx   . Rectal cancer Neg Hx   . Stomach cancer Neg Hx   . Colon polyps Neg Hx     SOCIAL HISTORY:  Social History   Socioeconomic History  . Marital status: Married    Spouse name: Not on file  . Number of children: Not on file  . Years of education: Not on file  . Highest education level: Not on file  Occupational History  .  Not on file  Tobacco Use  . Smoking status: Never Smoker  . Smokeless tobacco: Never Used  Substance and Sexual Activity  . Alcohol use: No  . Drug use: No  . Sexual activity: Yes    Birth control/protection: Surgical  Other Topics Concern  . Not on file  Social History Narrative  . Not on file   Social Determinants of Health   Financial Resource Strain:   . Difficulty of Paying Living Expenses:   Food Insecurity:   . Worried About Charity fundraiser in the Last Year:   . Arboriculturist in the Last Year:   Transportation Needs:   . Film/video editor (Medical):   Marland Kitchen Lack of Transportation (Non-Medical):   Physical Activity:   . Days of Exercise per Week:   . Minutes of Exercise per Session:   Stress:   . Feeling of Stress :   Social Connections:   . Frequency of Communication with Friends and Family:   . Frequency of Social Gatherings with Friends and Family:   . Attends Religious Services:   . Active Member of Clubs or Organizations:   . Attends Archivist Meetings:   Marland Kitchen Marital Status:   Intimate Partner Violence:   . Fear of Current or Ex-Partner:   . Emotionally Abused:   Marland Kitchen Physically Abused:   . Sexually Abused:      PHYSICAL EXAM  Vitals:   09/13/19 1418  BP: 112/70  Pulse: 84  Weight: 169 lb 8 oz (76.9 kg)  Height: 5\' 3"  (1.6 m)    Body mass index is 30.03 kg/m.   General: The patient is well-developed and well-nourished and in no acute distress.   Affect is normal.   She is fidgety.  No lip smacking   Musculoskeletal:   She is tender over left occiput at the splenius capitis muscle and occipital nerve  Neurologic Exam  Mental status: The patient is alert and oriented x 3 at the time of the examination. The patient has apparent normal recent and remote memory, with an apparently normal attention span and concentration ability.   Speech is normal.  Cranial nerves: Extraocular movements are full.  Facial strength and sensation is normal.  Trapezius strength is normal.   Trapezius and sternocleidomastoid strength is normal. No dysarthria is noted.     Motor:  Muscle bulk is normal. Tone was normal in the arms but slightly increased in the legs, a little more on the right. Strength is  5 / 5 in all 4 extremities.   Sensory: Sensory testing is intact to touch in all 4 extremities.  Coordination: Cerebellar testing reveals good finger-nose-finger   Gait and station: The station is stable.. Gait is normal.  Tandem gait is wide.   Borderline Romberg.    Reflexes: Deep tendon reflexes are brisk in the legs withoout spread and normal in the arms bilaterally.     DIAGNOSTIC DATA (LABS, IMAGING, TESTING) - I reviewed patient records, labs, notes, testing and imaging myself where available.  Lab Results  Component Value Date   WBC 7.1 08/10/2019   HGB 12.6 08/10/2019   HCT 39.1 08/10/2019   MCV 81.5 08/10/2019   PLT 357 08/10/2019      Component Value Date/Time   NA 140 08/10/2019 1403   K 3.7 08/10/2019 1403   CL 100 08/10/2019 1403   CO2 32 08/10/2019 1403   GLUCOSE 85 08/10/2019 1403   BUN 11 08/10/2019 1403  CREATININE 1.91 (H) 08/10/2019 1403   CALCIUM 9.5 08/10/2019 1403   PROT 7.1 08/10/2019 1403   ALBUMIN 3.8 11/10/2017 1229   AST 21 08/10/2019 1403   ALT 13 08/10/2019 1403   ALKPHOS 58 11/10/2017 1229   BILITOT 0.6 08/10/2019 1403   GFRNONAA 28 (L) 08/10/2019 1403   GFRAA 32 (L) 08/10/2019 1403   Lab Results    Component Value Date   CHOL 143 08/10/2019   HDL 53 08/10/2019   LDLCALC 65 08/10/2019   TRIG 186 (H) 08/10/2019   CHOLHDL 2.7 08/10/2019   Lab Results  Component Value Date   HGBA1C 5.5 05/10/2017   No results found for: VITAMINB12 Lab Results  Component Value Date   TSH 2.36 01/30/2019       ASSESSMENT AND PLAN  Obstructive sleep apnea  Other headache syndrome  History of Bell's palsy  Abnormal brain MRI  Fidgeting   1.   She will remain off of a disease modifying therapy as the diagnosis of MS is unlikely based on the stable nonspecific foci on MRI. 2.    Due to possible issues with CPAP machine, she is advised to have her DME company look at her CPAP.  If necessary, we will get another machine.   3.    Left splenius capitis trigger point injection with 80 mg Depo-Medrol and 3 cc Marcaine using sterile technique.  Headache and neck pain was better afterwards.   4.   Reduced Cymbalta to 60 mg.     Return in 12 months or sooner if there are new or worsening neurologic symptoms.   Nour Rodrigues A. Felecia Shelling, MD, PhD [id number], [id number] PM Certified in Neurology, Clinical Neurophysiology, Sleep Medicine, Pain Medicine and Neuroimaging  [facility] [address] [place], [place] [address] [phone]

## 2019-09-15 ENCOUNTER — Encounter: Payer: Self-pay | Admitting: Internal Medicine

## 2019-09-18 ENCOUNTER — Telehealth: Payer: Self-pay | Admitting: *Deleted

## 2019-09-18 NOTE — Telephone Encounter (Signed)
PA submitted, waiting on determination from [facility]. Marked urgent

## 2019-09-18 NOTE — Telephone Encounter (Signed)
Request Reference Number: [id number]. TRAMADOL HCL TAB 50MG  is approved through 03/20/2020. Your patient may now fill this prescription and it will be covered.

## 2019-09-18 NOTE — Telephone Encounter (Signed)
Submitted PA armodafinil on CMM. Key: [id number]. Waiting on determination from [facility].

## 2019-09-18 NOTE — Telephone Encounter (Signed)
Initiated PA tramadol on CMM. Key: [id number] - PA Case [id number]. In process of completing.

## 2019-09-19 MED ORDER — MODAFINIL 200 MG PO TABS: 200.0000 mg | ORAL_TABLET | Freq: Every day | ORAL | 1 refills | Status: DC

## 2019-09-19 NOTE — Addendum Note (Signed)
Addended by: Britt Bottom on: 09/19/2019 12:31 PM   Modules accepted: Orders

## 2019-09-19 NOTE — Addendum Note (Signed)
Addended by: Wyvonnia Lora on: 09/19/2019 11:35 AM   Modules accepted: Orders

## 2019-09-19 NOTE — Telephone Encounter (Addendum)
Received letter from [facility] that PA is denied d/t non-compliance with CPAP machine. Pt can use goodrx and fill at a retail pharmacy like [facility] or [facility]. We can also switch to modafinil 200mg  per MD. This is a little cheaper than armodafinil with goodrx (90days supply).   I called pt to discuss. She is agreeable to switch rx to modafinil 200mg  tablet po qd #90, 1 refill and fill at [facility]  [address], [place], [place] [address]. Advised I will send there with goodrx coupon info. She verbalized understanding and appreciation. I called [facility] at [phone] and spoke with Tammy. Cx rx armodafinil sent 09/13/19 #90, 1 refill.

## 2019-09-20 ENCOUNTER — Ambulatory Visit (HOSPITAL_COMMUNITY): Payer: BC Managed Care – PPO | Admitting: Psychiatry

## 2019-09-20 ENCOUNTER — Telehealth (HOSPITAL_COMMUNITY): Payer: Self-pay | Admitting: Psychiatry

## 2019-09-20 ENCOUNTER — Other Ambulatory Visit: Payer: Self-pay

## 2019-09-20 NOTE — Telephone Encounter (Signed)
Therapist attempted to contact patient twice via text through care agility platform for scheduled appointment, no response.  Therapist called patient, left message indicating attempt and requesting patient call office. 

## 2019-09-26 ENCOUNTER — Other Ambulatory Visit: Payer: Self-pay

## 2019-09-26 ENCOUNTER — Ambulatory Visit (AMBULATORY_SURGERY_CENTER): Payer: BC Managed Care – PPO | Admitting: Internal Medicine

## 2019-09-26 ENCOUNTER — Encounter: Payer: Self-pay | Admitting: Internal Medicine

## 2019-09-26 VITALS — BP 112/58 | HR 67 | Temp 96.9°F | Resp 14 | Ht 63.0 in | Wt 162.0 lb

## 2019-09-26 DIAGNOSIS — K317 Polyp of stomach and duodenum: Secondary | ICD-10-CM

## 2019-09-26 DIAGNOSIS — K219 Gastro-esophageal reflux disease without esophagitis: Secondary | ICD-10-CM

## 2019-09-26 DIAGNOSIS — R1310 Dysphagia, unspecified: Secondary | ICD-10-CM

## 2019-09-26 DIAGNOSIS — R1011 Right upper quadrant pain: Secondary | ICD-10-CM

## 2019-09-26 MED ORDER — SODIUM CHLORIDE 0.9 % IV SOLN: 500.0000 mL | Freq: Once | INTRAVENOUS | Status: DC

## 2019-09-26 MED ORDER — DEXLANSOPRAZOLE 60 MG PO CPDR: 60.0000 mg | DELAYED_RELEASE_CAPSULE | Freq: Every day | ORAL | 3 refills | Status: DC

## 2019-09-26 NOTE — Op Note (Signed)
[facility] Patient Name: Pamela Walters Procedure Date: 09/26/2019 11:31 AM MRN: [id number] Endoscopist: Jerene Bears , MD Age: 59 Referring MD:  Date of Birth: 1960/12/05 Gender: Female Account #: [id number] Procedure:                Upper GI endoscopy Indications:              Abdominal pain in the right upper quadrant,                            Dysphagia, Gastro-esophageal reflux disease,                            previous dilation of the esophageal March 2018 to                            17 mm with balloon, recent abd [place] with fatty liver                            but normal gallbladder Medicines:                Monitored Anesthesia Care Procedure:                Pre-Anesthesia Assessment:                           - Prior to the procedure, a History and Physical                            was performed, and patient medications and                            allergies were reviewed. The patient's tolerance of                            previous anesthesia was also reviewed. The risks                            and benefits of the procedure and the sedation                            options and risks were discussed with the patient.                            All questions were answered, and informed consent                            was obtained. Prior Anticoagulants: The patient has                            taken no previous anticoagulant or antiplatelet                            agents. ASA Grade Assessment: II - A patient with  mild systemic disease. After reviewing the risks                            and benefits, the patient was deemed in                            satisfactory condition to undergo the procedure.                           After obtaining informed consent, the endoscope was                            passed under direct vision. Throughout the                            procedure, the patient's blood pressure, pulse,  and                            oxygen saturations were monitored continuously. The                            Endoscope was introduced through the mouth, and                            advanced to the second part of duodenum. The upper                            GI endoscopy was accomplished without difficulty.                            The patient tolerated the procedure well. Scope In: Scope Out: Findings:                 Normal mucosa was found in the entire esophagus.                           One benign-appearing, intrinsic moderate                            (circumferential scarring or stenosis; an endoscope                            may pass) stenosis was found at the                            gastroesophageal junction. This stenosis measured                            less than one cm (in length). The stenosis was                            traversed. A TTS dilator was passed through the  scope. Dilation with a 16-17-18 mm balloon dilator                            was performed to 18 mm. The dilation site was                            examined and showed mild mucosal disruption.                           The cardia and gastric fundus were normal on                            retroflexion.                           Multiple small sessile polyps with no bleeding were                            found in the gastric fundus and in the gastric                            body. These polyp have the typical appearance of                            fundic gland polyps.                           The exam of the stomach was otherwise normal. The                            previously seen antral gastritis has improved                            significantly.                           The examined duodenum was normal. Complications:            No immediate complications. Estimated Blood Loss:     Estimated blood loss was minimal. Impression:               -  Normal mucosa was found in the entire esophagus.                           - Benign-appearing esophageal stenosis. Dilated to                            18 mm with balloon.                           - Multiple benign gastric polyps (fundic gland                            polyps).                           -  Normal examined duodenum.                           - No specimens collected. Recommendation:           - Patient has a contact number available for                            emergencies. The signs and symptoms of potential                            delayed complications were discussed with the                            patient. Return to normal activities tomorrow.                            Written discharge instructions were provided to the                            patient.                           - Resume previous diet.                           - Continue present medications, but change                            pantoprazole from 40 mg BID to Dexilant 60 mg once                            daily (best taken 30 minutes before 1st meal of the                            day).                           - If RUQ pain continues, then can consider HIDA                            scan to evaluate gallbladder function. Fatty liver                            seen by [place], but liver enzymes normal.                           - Office follow-up if persistent right upper                            abdominal pain, uncontrolled acid reflux or                            persistent trouble swallowing. Jerene Bears, MD 09/26/2019 12:02:40 PM This report has been signed electronically.

## 2019-09-26 NOTE — Progress Notes (Signed)
Report to PACU, RN, vss, BBS= Clear.  

## 2019-09-26 NOTE — Patient Instructions (Addendum)
Thank you for allowing [place] to care for you today!  Resume previous diet and medications today.   Discontinue Pantoprazole and add  Dexilant 60 mg once daily by mouth. ( Best taken 30 minutes before first meal of the day) Prescription sent to your mail order pharmacy.  Resume your normal activities, including driving, tomorrow.  If right upper quadrant pain persists, then consider HIDA scan to evaluate gallbladder function.  Office follow-up if persistent right upper quadrant pain, uncontrolled acid reflux,  or persistent trouble swallowing.       YOU HAD AN ENDOSCOPIC PROCEDURE TODAY AT [facility]:   Refer to the procedure report that was given to you for any specific questions about what was found during the examination.  If the procedure report does not answer your questions, please call your gastroenterologist to clarify.  If you requested that your care partner not be given the details of your procedure findings, then the procedure report has been included in a sealed envelope for you to review at your convenience later.  YOU SHOULD EXPECT: Some feelings of bloating in the abdomen. Passage of more gas than usual.  Walking can help get rid of the air that was put into your GI tract during the procedure and reduce the bloating. If you had a lower endoscopy (such as a colonoscopy or flexible sigmoidoscopy) you may notice spotting of blood in your stool or on the toilet paper. If you underwent a bowel prep for your procedure, you may not have a normal bowel movement for a few days.  Please Note:  You might notice some irritation and congestion in your nose or some drainage.  This is from the oxygen used during your procedure.  There is no need for concern and it should clear up in a day or so.  SYMPTOMS TO REPORT IMMEDIATELY:    Following upper endoscopy (EGD)  Vomiting of blood or coffee ground material  New chest pain or pain under the shoulder blades  Painful or  persistently difficult swallowing  New shortness of breath  Fever of 100F or higher  Black, tarry-looking stools  For urgent or emergent issues, a gastroenterologist can be reached at any hour by calling [phone]. Do not use MyChart messaging for urgent concerns.    DIET:  We do recommend a small meal at first, but then you may proceed to your regular diet.  Drink plenty of fluids but you should avoid alcoholic beverages for 24 hours.  ACTIVITY:  You should plan to take it easy for the rest of today and you should NOT DRIVE or use heavy machinery until tomorrow (because of the sedation medicines used during the test).    FOLLOW UP: Our staff will call the number listed on your records 48-72 hours following your procedure to check on you and address any questions or concerns that you may have regarding the information given to you following your procedure. If we do not reach you, we will leave a message.  We will attempt to reach you two times.  During this call, we will ask if you have developed any symptoms of COVID 19. If you develop any symptoms (ie: fever, flu-like symptoms, shortness of breath, cough etc.) before then, please call [phone].  If you test positive for Covid 19 in the 2 weeks post procedure, please call and report this information to [place].    If any biopsies were taken you will be contacted by phone or by letter within  the next 1-3 weeks.  Please call us at [phone] if you have not heard about the biopsies in 3 weeks.    SIGNATURES/CONFIDENTIALITY: You and/or your care partner have signed paperwork which will be entered into your electronic medical record.  These signatures attest to the fact that that the information above on your After Visit Summary has been reviewed and is understood.  Full responsibility of the confidentiality of this discharge information lies with you and/or your care-partner.

## 2019-09-26 NOTE — Progress Notes (Signed)
Called to room to assist during endoscopic procedure.  Patient ID and intended procedure confirmed with present staff. Received instructions for my participation in the procedure from the performing physician.  

## 2019-09-26 NOTE — Progress Notes (Signed)
V/s cW I have reviewed the patient's medical history in detail and updated the computerized patient record.

## 2019-09-28 ENCOUNTER — Telehealth: Payer: Self-pay

## 2019-09-28 ENCOUNTER — Telehealth: Payer: Self-pay | Admitting: *Deleted

## 2019-09-28 NOTE — Telephone Encounter (Signed)
Left message on 2nd follow up call. 

## 2019-09-28 NOTE — Telephone Encounter (Signed)
First follow up call attempt.  Message left on voicemail. 

## 2019-10-04 ENCOUNTER — Other Ambulatory Visit: Payer: Self-pay

## 2019-10-04 ENCOUNTER — Ambulatory Visit (HOSPITAL_COMMUNITY): Payer: BC Managed Care – PPO | Admitting: Psychiatry

## 2019-10-04 ENCOUNTER — Telehealth (HOSPITAL_COMMUNITY): Payer: Self-pay | Admitting: Psychiatry

## 2019-10-04 NOTE — Telephone Encounter (Signed)
Therapist attempted to contact patient twice for scheduled appointment via text through [place], no response.  Therapist called patient and left message indicating attempt and requesting patient call office.

## 2019-10-12 ENCOUNTER — Other Ambulatory Visit: Payer: Self-pay | Admitting: Family Medicine

## 2019-10-18 ENCOUNTER — Other Ambulatory Visit: Payer: Self-pay

## 2019-10-18 ENCOUNTER — Telehealth (HOSPITAL_COMMUNITY): Payer: BC Managed Care – PPO | Admitting: Psychiatry

## 2019-10-23 ENCOUNTER — Other Ambulatory Visit (HOSPITAL_COMMUNITY): Payer: Self-pay | Admitting: Psychiatry

## 2019-10-23 ENCOUNTER — Inpatient Hospital Stay (HOSPITAL_COMMUNITY): Admission: RE | Admit: 2019-10-23 | Payer: BC Managed Care – PPO | Source: Ambulatory Visit

## 2019-10-23 MED ORDER — PRAZOSIN HCL 1 MG PO CAPS: 1.0000 mg | ORAL_CAPSULE | Freq: Every day | ORAL | 2 refills | Status: DC

## 2019-10-23 MED ORDER — BUPROPION HCL ER (XL) 150 MG PO TB24: 150.0000 mg | ORAL_TABLET | ORAL | 2 refills | Status: DC

## 2019-11-01 ENCOUNTER — Telehealth (HOSPITAL_COMMUNITY): Payer: Self-pay | Admitting: Psychiatry

## 2019-11-01 ENCOUNTER — Ambulatory Visit (HOSPITAL_COMMUNITY): Payer: BC Managed Care – PPO | Admitting: Psychiatry

## 2019-11-01 ENCOUNTER — Other Ambulatory Visit: Payer: Self-pay

## 2019-11-01 NOTE — Telephone Encounter (Signed)
Therapist attempted to contact patient twice via text through [place] for scheduled appointment, no response.  Therapist called patient,  left message indicating attempt and requesting patient call office.

## 2019-11-06 ENCOUNTER — Ambulatory Visit (HOSPITAL_COMMUNITY): Payer: BC Managed Care – PPO

## 2019-11-07 ENCOUNTER — Other Ambulatory Visit: Payer: Self-pay | Admitting: Neurology

## 2019-11-15 ENCOUNTER — Other Ambulatory Visit: Payer: Self-pay

## 2019-11-15 ENCOUNTER — Telehealth (HOSPITAL_COMMUNITY): Payer: BC Managed Care – PPO | Admitting: Psychiatry

## 2019-11-15 ENCOUNTER — Telehealth (HOSPITAL_COMMUNITY): Payer: Self-pay | Admitting: Psychiatry

## 2019-11-15 NOTE — Telephone Encounter (Signed)
Therapist attempted to contact patient twice via text for scheduled appointment through [place], no response.  Therapist called patient and left message indicating attempt and requesting patient call office.

## 2019-11-25 ENCOUNTER — Other Ambulatory Visit: Payer: Self-pay | Admitting: Family Medicine

## 2019-11-29 ENCOUNTER — Other Ambulatory Visit: Payer: Self-pay

## 2019-11-29 ENCOUNTER — Telehealth (HOSPITAL_COMMUNITY): Payer: BC Managed Care – PPO | Admitting: Psychiatry

## 2019-12-11 ENCOUNTER — Encounter (HOSPITAL_COMMUNITY): Payer: Self-pay | Admitting: Psychiatry

## 2019-12-11 ENCOUNTER — Other Ambulatory Visit: Payer: Self-pay

## 2019-12-11 ENCOUNTER — Telehealth (INDEPENDENT_AMBULATORY_CARE_PROVIDER_SITE_OTHER): Payer: BC Managed Care – PPO | Admitting: Psychiatry

## 2019-12-11 DIAGNOSIS — F331 Major depressive disorder, recurrent, moderate: Secondary | ICD-10-CM | POA: Diagnosis not present

## 2019-12-11 MED ORDER — BUPROPION HCL ER (XL) 150 MG PO TB24: 150.0000 mg | ORAL_TABLET | ORAL | 2 refills | Status: DC

## 2019-12-11 MED ORDER — CLONAZEPAM 0.5 MG PO TABS: 0.5000 mg | ORAL_TABLET | Freq: Every day | ORAL | 2 refills | Status: DC | PRN

## 2019-12-11 MED ORDER — PRAZOSIN HCL 1 MG PO CAPS: 1.0000 mg | ORAL_CAPSULE | Freq: Every day | ORAL | 2 refills | Status: DC

## 2019-12-11 MED ORDER — DULOXETINE HCL 60 MG PO CPEP: 120.0000 mg | ORAL_CAPSULE | Freq: Every day | ORAL | 2 refills | Status: DC

## 2019-12-11 NOTE — Progress Notes (Signed)
Virtual Visit via Video Note  I connected with Pamela Walters on 12/11/19 at  9:40 AM EDT by a video enabled telemedicine application and verified that I am speaking with the correct person using two identifiers.   I discussed the limitations of evaluation and management by telemedicine and the availability of in person appointments. The patient expressed understanding and agreed to proceed.   I discussed the assessment and treatment plan with the patient. The patient was provided an opportunity to ask questions and all were answered. The patient agreed with the plan and demonstrated an understanding of the instructions.   The patient was advised to call back or seek an in-person evaluation if the symptoms worsen or if the condition fails to improve as anticipated.  I provided 15 minutes of non-face-to-face time during this encounter. Location: Provider office, patient home  [name], MD  [facility] MD/PA/NP OP Progress Note  12/11/2019 9:55 AM Pamela Walters  MRN:  [id number]  Chief Complaint:  Chief Complaint    Depression; Anxiety; Follow-up     HPI: This patient is a 59 year old married white female who lives with her husband in [place].  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  The patient returns for follow-up after 3 months.  Several months ago we added Wellbutrin to her regimen and seems to have helped a lot with her mood and motivation and fatigue.  Her neurologist recently cut down her Cymbalta because he thought it might be causing some jerking and twitching.  However cutting it down has not helped the jerking or twitching which preceded the Wellbutrin as well.  She does not feel as well on the lower doses a little bit more lethargic and does not sleep as well.  She would like to go back to the 120 mg daily and I think this is reasonable.  She is sleeping well without nightmares.  She is thinking of retiring soon as she does not think she can handle another wave of the  Covid pandemic in her hospital as it was mentally and physically exhausting last year. Visit Diagnosis:    ICD-10-CM   1. Major depressive disorder, recurrent episode, moderate (HCC)  F33.1     Past Psychiatric History: none  Past Medical History:  Past Medical History:  Diagnosis Date  . Allergy   . Anemia   . Anxiety   . Arthritis   . Back pain   . Bell's palsy 08/2017  . Depression   . GERD (gastroesophageal reflux disease)   . Hepatic steatosis   . Hiatal hernia   . History of Bell's palsy 02/11/2018  . Hyperlipidemia   . Hypertension   . Internal hemorrhoids   . MS (multiple sclerosis) ([place])    2007  . Multiple sclerosis ([place])   . Multiple sclerosis ([place]) 04/04/2008   Qualifier: Diagnosis of  By: Moshe Cipro MD, Joycelyn Schmid    . Schatzki's ring   . Sleep apnea    wears CPAP    Past Surgical History:  Procedure Laterality Date  . ABDOMINAL HYSTERECTOMY  2005   fibroids  . CARDIAC CATHETERIZATION N/A 05/04/2016   Procedure: Left Heart Cath and Coronary Angiography;  Surgeon: Jettie Booze, MD;  Location: [facility];  Service: Cardiovascular;  Laterality: N/A;  . COLONOSCOPY    . ESOPHAGOGASTRODUODENOSCOPY N/A 09/18/2013   Procedure: ESOPHAGOGASTRODUODENOSCOPY (EGD);  Surgeon: Jerene Bears, MD;  Location: [facility];  Service: Gastroenterology;  Laterality: N/A;  . UPPER GASTROINTESTINAL ENDOSCOPY    .  WISDOM TOOTH EXTRACTION      Family Psychiatric History: see below  Family History:  Family History  Problem Relation Age of Onset  . Hypertension Mother   . Hyperlipidemia Mother   . Depression Mother   . Hypertension Father   . Colon cancer Neg Hx   . Esophageal cancer Neg Hx   . Rectal cancer Neg Hx   . Stomach cancer Neg Hx   . Colon polyps Neg Hx     Social History:  Social History   Socioeconomic History  . Marital status: Married    Spouse name: Not on file  . Number of children: Not on file  . Years of education: Not on file  .  Highest education level: Not on file  Occupational History  . Not on file  Tobacco Use  . Smoking status: Never Smoker  . Smokeless tobacco: Never Used  Vaping Use  . Vaping Use: Never used  Substance and Sexual Activity  . Alcohol use: No  . Drug use: No  . Sexual activity: Yes    Birth control/protection: Surgical  Other Topics Concern  . Not on file  Social History Narrative  . Not on file   Social Determinants of Health   Financial Resource Strain:   . Difficulty of Paying Living Expenses:   Food Insecurity:   . Worried About Charity fundraiser in the Last Year:   . Arboriculturist in the Last Year:   Transportation Needs:   . Film/video editor (Medical):   Marland Kitchen Lack of Transportation (Non-Medical):   Physical Activity:   . Days of Exercise per Week:   . Minutes of Exercise per Session:   Stress:   . Feeling of Stress :   Social Connections:   . Frequency of Communication with Friends and Family:   . Frequency of Social Gatherings with Friends and Family:   . Attends Religious Services:   . Active Member of Clubs or Organizations:   . Attends Archivist Meetings:   Marland Kitchen Marital Status:     Allergies: No Known Allergies  Metabolic Disorder Labs: Lab Results  Component Value Date   HGBA1C 5.5 05/10/2017   MPG 111 05/10/2017   MPG 108 02/20/2016   No results found for: PROLACTIN Lab Results  Component Value Date   CHOL 143 08/10/2019   TRIG 186 (H) 08/10/2019   HDL 53 08/10/2019   CHOLHDL 2.7 08/10/2019   VLDL 34 (H) 06/30/2016   LDLCALC 65 08/10/2019   LDLCALC 45 01/30/2019   Lab Results  Component Value Date   TSH 2.36 01/30/2019   TSH 2.36 05/10/2017    Therapeutic Level Labs: No results found for: LITHIUM No results found for: VALPROATE No components found for:  CBMZ  Current Medications: Current Outpatient Medications  Medication Sig Dispense Refill  . amLODipine (NORVASC) 10 MG tablet Take 10 mg by mouth daily.    Marland Kitchen  amLODipine (NORVASC) 5 MG tablet Take 5 mg by mouth daily.    Marland Kitchen aspirin 81 MG tablet Take 81 mg by mouth daily.    Marland Kitchen buPROPion (WELLBUTRIN XL) 150 MG 24 hr tablet Take 1 tablet (150 mg total) by mouth every morning. 90 tablet 2  . clonazePAM (KLONOPIN) 0.5 MG tablet Take 1 tablet (0.5 mg total) by mouth daily as needed for anxiety. 30 tablet 2  . dexlansoprazole (DEXILANT) 60 MG capsule Take 1 capsule (60 mg total) by mouth daily. 90 capsule 3  .  DULoxetine (CYMBALTA) 60 MG capsule Take 2 capsules (120 mg total) by mouth daily. 180 capsule 2  . ezetimibe (ZETIA) 10 MG tablet TAKE 1 TABLET BY MOUTH  DAILY 90 tablet 3  . gabapentin (NEURONTIN) 600 MG tablet Take 1 tablet (600 mg total) by mouth 3 (three) times daily. 270 tablet 3  . metaxalone (SKELAXIN) 800 MG tablet Take 1 tablet (800 mg total) by mouth 3 (three) times daily. 90 tablet 3  . mirabegron ER (MYRBETRIQ) 25 MG TB24 tablet Take 1 tablet (25 mg total) by mouth daily. 30 tablet 3  . modafinil (PROVIGIL) 200 MG tablet Take 1 tablet (200 mg total) by mouth daily. 90 tablet 1  . mometasone (NASONEX) 50 MCG/ACT nasal spray USE 2 SPRAYS NASALLY DAILY 51 g 1  . Multiple Vitamin (MULTIVITAMIN WITH MINERALS) TABS tablet Take 1 tablet by mouth daily.    . pantoprazole (PROTONIX) 40 MG tablet Take 1 tablet (40 mg total) by mouth 2 (two) times daily. 180 tablet 3  . prazosin (MINIPRESS) 1 MG capsule Take 1 capsule (1 mg total) by mouth at bedtime. 90 capsule 2  . rosuvastatin (CRESTOR) 40 MG tablet TAKE 1 TABLET BY MOUTH  DAILY 90 tablet 3  . traMADol (ULTRAM) 50 MG tablet TAKE 1 TO 2 TABLETS BY  MOUTH UP TO 4 PILLS DAILY  AS NEEDED , BUT NO MORE THAN 90 TABLETS PER MONTH 270 tablet 1  . triamterene-hydrochlorothiazide (MAXZIDE-25) 37.5-25 MG tablet TAKE 1 TABLET BY MOUTH  DAILY 90 tablet 3   No current facility-administered medications for this visit.   Facility-Administered Medications Ordered in Other Visits  Medication Dose Route Frequency  Provider Last Rate Last Admin  . gadopentetate dimeglumine (MAGNEVIST) injection 20 mL  20 mL Intravenous Once PRN Sater, Nanine Means, MD         Musculoskeletal: Strength & Muscle Tone: within normal limits Gait & Station: normal Patient leans: N/A  Psychiatric Specialty Exam: Review of Systems  Constitutional: Positive for fatigue.  Musculoskeletal: Positive for myalgias and neck pain.  Neurological: Positive for tremors.  All other systems reviewed and are negative.   There were no vitals taken for this visit.There is no height or weight on file to calculate BMI.  General Appearance: Casual and Fairly Groomed  Eye Contact:  Good  Speech:  Clear and Coherent  Volume:  Normal  Mood:  Euthymic  Affect:  Appropriate and Congruent  Thought Process:  Goal Directed  Orientation:  Full (Time, Place, and Person)  Thought Content: WDL   Suicidal Thoughts:  No  Homicidal Thoughts:  No  Memory:  Immediate;   Good Recent;   Good Remote;   Fair  Judgement:  Good  Insight:  Good  Psychomotor Activity:  Decreased  Concentration:  Concentration: Good and Attention Span: Good  Recall:  Good  Fund of Knowledge: Good  Language: Good  Akathisia:  No  Handed:  Right  AIMS (if indicated): not done  Assets:  Communication Skills Desire for Improvement Resilience Social Support Talents/Skills  ADL's:  Intact  Cognition: WNL  Sleep:  Good   Screenings: PHQ2-9     Office Visit from 06/20/2019 in [facility] Office Visit from 01/30/2019 in [facility] Counselor from 06/01/2018 in [facility] Office Visit from 03/07/2018 in [facility] Office Visit from 01/05/2018 in [facility]  PHQ-2 Total Score 4 4 4 2 3   PHQ-9 Total Score 12 15 16 10  12  Assessment and Plan: This patient is a 59 year old female with a history of depression anxiety chronic fatigue and fibromyalgia.  She seems to be doing well  although she feels better on the higher dosage of Cymbalta.  She states that the twitching seems to be muscular and is relieved with Skelaxin.  She will continue Wellbutrin XL 150 mg daily as well as go back to Cymbalta 120 mg daily for depression.  She will continue prazosin 1 mg at bedtime for nightmares and clonazepam 0.5 mg daily as needed for anxiety.  She will return to see me in 3 months   [name], MD 12/11/2019, 9:55 AM

## 2019-12-20 ENCOUNTER — Other Ambulatory Visit: Payer: Self-pay

## 2019-12-20 ENCOUNTER — Ambulatory Visit (INDEPENDENT_AMBULATORY_CARE_PROVIDER_SITE_OTHER): Payer: BC Managed Care – PPO | Admitting: Psychiatry

## 2019-12-20 DIAGNOSIS — F331 Major depressive disorder, recurrent, moderate: Secondary | ICD-10-CM

## 2019-12-20 NOTE — Progress Notes (Signed)
Virtual Visit via Video Note  I connected with Pamela Walters on 12/20/19 at  4:00 PM EDT by a video enabled telemedicine application and verified that I am speaking with the correct person using two identifiers.   I discussed the limitations of evaluation and management by telemedicine and the availability of in person appointments. The patient expressed understanding and agreed to proceed.  I provided 47 minutes of non-face-to-face time during this encounter.   Alonza Smoker, LCSW                     THERAPIST PROGRESS NOTE           Location: Patient - Home/ Provider - [place] office   Session Time:   Wednesday 12/20/2019 4:00 PM - 4:47 PM   Participation Level: Active  Behavioral Response: Casual/ alert, anxious, depressed           Type of Therapy: Individual Therapy        Treatment Goals:                       1. Learn and implement coping and behavioral strategies to overcome depression.      2. Identify and replace thoughts and beliefs that support depression.                                 Treatment Goals addressed: 1,2  Interventions: Supportive,  CBT  Summary: Pamela Walters is a 59 y.o. female who is referred for services by PCP Dr. Moshe Cipro due to patient suffering symptoms of depression. Patient has been seen in the office interminttently for medication management and outpatient psychotherapy since 2016. She recently resumed services and was last seen 4 weeks ago. She reports increased depressed mood, poor motivation, anxiety, decreased interest in activities, diminished pleasure in activities, lack of appetite, excessive worry, and fatigue since last session. Patient also reports increased worry about having another panic attack as she had one about 2 months ago. She expresses frustration with self as she was not able to enjoy the holidays as she has in the past. 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  She also reports she has not   been practicing relaxation techniques consistently.        Patient last was seen by virtual visit via video visit in April 2021. She is experiencing moderate symptoms of anxiety and mild symptoms of  depression.  She reports being very depressed a few months ago and missing some of her previous therapy appointments due to being depressed.  She states still feeling a little down.  She reports she is thinking more clearly and has more energy since taking BuSpar as prescribed by psychiatrist Dr. Harrington Challenger.  She reports anxiety and ruminating thoughts especially at night about a variety of issues but mainly her family.  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  She has begun to set limits regarding her work schedule at the hospital and is considering retirement.  Other stressors include worries about her son who is having marital issues and suffers from depression/worries about her granddaughter who is engaged to someone patient has concerns about.  She also worries about family members and fellow church members who have decided not to take the vaccine.  Patient  states not being at peace and having  difficulty enjoying life the way she would like.    Suicidal/Homicidal: No  Therapist Response:  Reviewed symptoms, discussed stressors, facilitated expression of thoughts and feelings, validated feelings, praised and reinforced patient's efforts to pace self/set limits regarding her job, discussed connection between thoughts/mood/behavior,began to assist patient examine her thought patterns and effects on her mood and behavior, reviewed relaxation techniques and developed plan with patient to practice daily, began to discuss next steps for treatment  Plan: Return again in 2 weeks.            Diagnosis: Axis I: Major Depressive Disorder, Recurrent, Moderate    Axis II: No diagnosis    Alonza Smoker, LCSW 12/20/2019

## 2020-01-03 ENCOUNTER — Ambulatory Visit (HOSPITAL_COMMUNITY): Payer: BC Managed Care – PPO | Admitting: Psychiatry

## 2020-01-12 ENCOUNTER — Other Ambulatory Visit: Payer: Self-pay | Admitting: Family Medicine

## 2020-01-17 ENCOUNTER — Ambulatory Visit (INDEPENDENT_AMBULATORY_CARE_PROVIDER_SITE_OTHER): Payer: BC Managed Care – PPO | Admitting: Psychiatry

## 2020-01-17 ENCOUNTER — Other Ambulatory Visit: Payer: Self-pay

## 2020-01-17 DIAGNOSIS — F331 Major depressive disorder, recurrent, moderate: Secondary | ICD-10-CM | POA: Diagnosis not present

## 2020-01-17 NOTE — Progress Notes (Signed)
Virtual Visit via Video Note  I connected with Pamela Walters on 01/17/20 at 11:20 AM EDT by a video enabled telemedicine application and verified that I am speaking with the correct person using two identifiers.   I discussed the limitations of evaluation and management by telemedicine and the availability of in person appointments. The patient expressed understanding and agreed to proceed.  I provided 45 minutes of non-face-to-face time during this encounter.   Alonza Smoker, LCSW                    THERAPIST PROGRESS NOTE           Location: Patient - Home/ Provider - [facility] [place] office   Session Time:   Wednesday 01/17/2020 11:20 AM -  12:05 PM   Participation Level: Active  Behavioral Response: Casual/ alert, anxious, depressed           Type of Therapy: Individual Therapy        Treatment Goals:                       1. Learn and implement coping and behavioral strategies to overcome depression.      2. Identify and replace thoughts and beliefs that support depression.                                 Treatment Goals addressed: 1,2  Interventions: Supportive,  CBT  Summary: Pamela Walters is a 59 y.o. female who is referred for services by PCP Dr. Moshe Cipro due to patient suffering symptoms of depression. Patient has been seen in the office interminttently for medication management and outpatient psychotherapy since 2016. She recently resumed services and was last seen 4 weeks ago. She reports increased depressed mood, poor motivation, anxiety, decreased interest in activities, diminished pleasure in activities, lack of appetite, excessive worry, and fatigue since last session. Patient also reports increased worry about having another panic attack as she had one about 2 months ago. She expresses frustration with self as she was not able to enjoy the holidays as she has in the past. 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  She also reports she has not   been practicing relaxation techniques consistently.        Patient last was seen by virtual visit via video about 4 weeks ago.  She reports increased symptoms of depression including depressed mood, fatigue, loss of appetite, and decreased interest and involvement in activities.  She states feeling as though she is thinking back into depression and reports staying in bed more.  She reports stress regarding her job and increased worry about her family.  Per patient's report, family is having conflict regarding members taking or not taking the vaccine for COVID-19.  She worries about how this will impact family events and fears this would negatively impact family unity. She reports worry of thoughts and is dreading the holidays.    Suicidal/Homicidal: No  Therapist Response:  Reviewed symptoms, discussed stressors, facilitated expression of thoughts and feelings, validated feelings, assisted patient identify triggers of increased symptoms of depression, assisted patient with problem solving regarding expressing her concerns to family in an assertive manner, discussed possible script using  I Messages, assisted patient identify ways to cope with ruminating thoughts by establishing a worry time and using cognitive defusion to cope with ruminating thoughts beyond designated  worry time, will send patient handouts ( worry period, leaves on a stream), developed plan for patient to establish a 15-minute daily worry time and to review handouts   Plan: Return again in 2 weeks.            Diagnosis: Axis I: Major Depressive Disorder, Recurrent, Moderate    Axis II: No diagnosis    Alonza Smoker, LCSW 01/17/2020

## 2020-01-23 ENCOUNTER — Other Ambulatory Visit: Payer: Self-pay | Admitting: Family Medicine

## 2020-02-05 ENCOUNTER — Other Ambulatory Visit: Payer: Self-pay

## 2020-02-05 MED ORDER — METAXALONE 800 MG PO TABS: 800.0000 mg | ORAL_TABLET | Freq: Three times a day (TID) | ORAL | 3 refills | Status: DC

## 2020-02-06 ENCOUNTER — Encounter: Payer: Self-pay | Admitting: Family Medicine

## 2020-02-06 ENCOUNTER — Ambulatory Visit (INDEPENDENT_AMBULATORY_CARE_PROVIDER_SITE_OTHER): Payer: BC Managed Care – PPO | Admitting: Family Medicine

## 2020-02-06 ENCOUNTER — Other Ambulatory Visit: Payer: Self-pay

## 2020-02-06 VITALS — BP 126/84 | HR 78 | Temp 98.4°F | Resp 18 | Ht 63.0 in | Wt 162.0 lb

## 2020-02-06 DIAGNOSIS — G35 Multiple sclerosis: Secondary | ICD-10-CM

## 2020-02-06 DIAGNOSIS — I10 Essential (primary) hypertension: Secondary | ICD-10-CM

## 2020-02-06 DIAGNOSIS — G4489 Other headache syndrome: Secondary | ICD-10-CM

## 2020-02-06 DIAGNOSIS — Z23 Encounter for immunization: Secondary | ICD-10-CM | POA: Diagnosis not present

## 2020-02-06 DIAGNOSIS — E663 Overweight: Secondary | ICD-10-CM

## 2020-02-06 DIAGNOSIS — Z01419 Encounter for gynecological examination (general) (routine) without abnormal findings: Secondary | ICD-10-CM

## 2020-02-06 DIAGNOSIS — G4733 Obstructive sleep apnea (adult) (pediatric): Secondary | ICD-10-CM

## 2020-02-06 DIAGNOSIS — E559 Vitamin D deficiency, unspecified: Secondary | ICD-10-CM | POA: Diagnosis not present

## 2020-02-06 DIAGNOSIS — F418 Other specified anxiety disorders: Secondary | ICD-10-CM

## 2020-02-06 DIAGNOSIS — E785 Hyperlipidemia, unspecified: Secondary | ICD-10-CM

## 2020-02-06 DIAGNOSIS — Z1231 Encounter for screening mammogram for malignant neoplasm of breast: Secondary | ICD-10-CM

## 2020-02-06 MED ORDER — FLUCONAZOLE 150 MG PO TABS
150.0000 mg | ORAL_TABLET | Freq: Once | ORAL | 0 refills | Status: AC
Start: 2020-02-06 — End: 2020-02-06

## 2020-02-06 MED ORDER — METAXALONE 800 MG PO TABS: ORAL_TABLET | ORAL | 5 refills | Status: DC

## 2020-02-06 MED ORDER — AZITHROMYCIN 250 MG PO TABS
ORAL_TABLET | ORAL | 0 refills | Status: DC
Start: 2020-02-06 — End: 2020-05-20

## 2020-02-06 NOTE — Assessment & Plan Note (Signed)
Message to psychiatry re high pHQ 9 score despite treatment

## 2020-02-06 NOTE — Assessment & Plan Note (Signed)
Hyperlipidemia:Low fat diet discussed and encouraged.   Lipid Panel  Lab Results  Component Value Date   CHOL 143 08/10/2019   HDL 53 08/10/2019   LDLCALC 65 08/10/2019   TRIG 186 (H) 08/10/2019   CHOLHDL 2.7 08/10/2019     Updated lab needed at/ before next visit. Not at goal

## 2020-02-06 NOTE — Assessment & Plan Note (Signed)
Controlled on gabapentin.

## 2020-02-06 NOTE — Patient Instructions (Signed)
F/U in office with MD in 4 months, call if you need me b before  Please schedule mammogram at checkout  Please get CBC, lipid, cmp and EGFr, TSH and vit D today  Dr Harrington Challenger has been notified re your current state    You are referred to [facility] for pelvic and pap   It is important that you exercise regularly at least 30 minutes 5 times a week. If you develop chest pain, have severe difficulty breathing, or feel very tired, stop exercising immediately and seek medical attention    Thanks for choosing [facility], we consider it a privelige to serve you.

## 2020-02-06 NOTE — Assessment & Plan Note (Signed)
Controlled, no change in medication DASH diet and commitment to daily physical activity for a minimum of 30 minutes discussed and encouraged, as a part of hypertension management. The importance of attaining a healthy weight is also discussed.  BP/Weight 02/06/2020 09/26/2019 09/13/2019 09/13/2019 08/14/2019 07/31/2019 0/30/0923  Systolic BP 300 762 - 263 335 - 456  Diastolic BP 84 58 - 70 64 - 78  Wt. (Lbs) 162 162 162 169.5 167 167 169.2  BMI 28.7 28.7 28.7 30.03 29.58 29.58 29.97  Some encounter information is confidential and restricted. Go to Review Flowsheets activity to see all data.

## 2020-02-06 NOTE — Assessment & Plan Note (Signed)
Reports compliance with CPAP.

## 2020-02-06 NOTE — Assessment & Plan Note (Signed)
-  Followed by Neurology. °

## 2020-02-06 NOTE — Progress Notes (Signed)
   Pamela Walters     MRN: [id number]      DOB: 1961/01/08   HPI [name] is here for follow up and re-evaluation of chronic medical conditions, medication management and review of any available recent lab and radiology data.  Preventive health is updated, specifically  Cancer screening and Immunization.   Nephrology evaluated pt and has stopped the maxzide needs updatedlab The PT denies any adverse reactions to current medications since the last visit.  Frontal and maxillary pressure x 1 week, no fever or chills, drainage is starting to be yellow, no chest congestion   ROS Denies recent fever or chills. Denies  nasal congestion, ear pain or sore throat. Denies chest congestion, productive cough or wheezing. Denies chest pains, palpitations and leg swelling Denies abdominal pain, nausea, vomiting,diarrhea or constipation.   Denies dysuria, frequency, hesitancy or incontinence. C/o generalized  joint pain, swelling and limitation in mobility. Denies headaches, seizures, numbness, or tingling. Uncontrolled depression, not suicidal or homicidal. Denies skin break down or rash.   PE  BP 126/84   Pulse 78   Temp 98.4 F (36.9 C)   Resp 18   Ht 5\' 3"  (1.6 m)   Wt 162 lb (73.5 kg)   SpO2 95%   BMI 28.70 kg/m   Patient alert and oriented and in no cardiopulmonary distress.  HEENT: No facial asymmetry, EOMI,     Neck supple .Frontal and maxillary sinus tenderness  Chest: Clear to auscultation bilaterally.  CVS: S1, S2 no murmurs, no S3.Regular rate.  ABD: Soft non tender.   Ext: No edema  MS: Adequate ROM spine, shoulders, hips and knees.  Skin: Intact, no ulcerations or rash noted.  Psych: Good eye contact, normal affect. Memory intact not anxious but at times tearful and  depressed appearing.  CNS: CN 2-12 intact, power,  normal throughout.no focal deficits noted.   Assessment & Plan  Essential hypertension Controlled, no change in medication DASH diet and  commitment to daily physical activity for a minimum of 30 minutes discussed and encouraged, as a part of hypertension management. The importance of attaining a healthy weight is also discussed.  BP/Weight 02/06/2020 09/26/2019 09/13/2019 09/13/2019 08/14/2019 07/31/2019 1/66/0630  Systolic BP 160 109 - 323 557 - 322  Diastolic BP 84 58 - 70 64 - 78  Wt. (Lbs) 162 162 162 169.5 167 167 169.2  BMI 28.7 28.7 28.7 30.03 29.58 29.58 29.97  Some encounter information is confidential and restricted. Go to Review Flowsheets activity to see all data.       Obstructive sleep apnea Reports compliance with CPAP  Hyperlipidemia LDL goal <100 Hyperlipidemia:Low fat diet discussed and encouraged.   Lipid Panel  Lab Results  Component Value Date   CHOL 143 08/10/2019   HDL 53 08/10/2019   LDLCALC 65 08/10/2019   TRIG 186 (H) 08/10/2019   CHOLHDL 2.7 08/10/2019     Updated lab needed at/ before next visit. Not at goal  Other headache syndrome Controlled on gabapentin  History of multiple sclerosis Followed by Neurology  Depression with anxiety Message to psychiatry re high pHQ 9 score despite treatment

## 2020-02-07 LAB — CBC
Hematocrit: 40.6 % (ref 34.0–46.6)
Hemoglobin: 13.1 g/dL (ref 11.1–15.9)
MCH: 26 pg — ABNORMAL LOW (ref 26.6–33.0)
MCHC: 32.3 g/dL (ref 31.5–35.7)
MCV: 81 fL (ref 79–97)
Platelets: 363 10*3/uL (ref 150–450)
RBC: 5.04 x10E6/uL (ref 3.77–5.28)
RDW: 15.7 % — ABNORMAL HIGH (ref 11.7–15.4)
WBC: 6.9 10*3/uL (ref 3.4–10.8)

## 2020-02-07 LAB — CMP14+EGFR
ALT: 12 IU/L (ref 0–32)
AST: 17 IU/L (ref 0–40)
Albumin/Globulin Ratio: 2 (ref 1.2–2.2)
Albumin: 4.7 g/dL (ref 3.8–4.8)
Alkaline Phosphatase: 88 IU/L (ref 44–121)
BUN/Creatinine Ratio: 12 (ref 12–28)
BUN: 11 mg/dL (ref 8–27)
Bilirubin Total: 0.3 mg/dL (ref 0.0–1.2)
CO2: 29 mmol/L (ref 20–29)
Calcium: 9.9 mg/dL (ref 8.7–10.3)
Chloride: 98 mmol/L (ref 96–106)
Creatinine, Ser: 0.93 mg/dL (ref 0.57–1.00)
GFR calc Af Amer: 77 mL/min/{1.73_m2} (ref >59)
GFR calc non Af Amer: 67 mL/min/{1.73_m2} (ref >59)
Globulin, Total: 2.4 g/dL (ref 1.5–4.5)
Glucose: 93 mg/dL (ref 65–99)
Potassium: 3.8 mmol/L (ref 3.5–5.2)
Sodium: 141 mmol/L (ref 134–144)
Total Protein: 7.1 g/dL (ref 6.0–8.5)

## 2020-02-07 LAB — LIPID PANEL
Chol/HDL Ratio: 5.9 ratio — ABNORMAL HIGH (ref 0.0–4.4)
Cholesterol, Total: 252 mg/dL — ABNORMAL HIGH (ref 100–199)
HDL: 43 mg/dL (ref >39)
LDL Chol Calc (NIH): 169 mg/dL — ABNORMAL HIGH (ref 0–99)
Triglycerides: 214 mg/dL — ABNORMAL HIGH (ref 0–149)
VLDL Cholesterol Cal: 40 mg/dL (ref 5–40)

## 2020-02-07 LAB — TSH: TSH: 2.48 u[IU]/mL (ref 0.450–4.500)

## 2020-02-07 LAB — VITAMIN D 25 HYDROXY (VIT D DEFICIENCY, FRACTURES): Vit D, 25-Hydroxy: 31.6 ng/mL (ref 30.0–100.0)

## 2020-02-12 ENCOUNTER — Telehealth (HOSPITAL_COMMUNITY): Payer: Self-pay | Admitting: Psychiatry

## 2020-02-12 NOTE — Telephone Encounter (Signed)
Called to schedule f/u left detailed message requesting call back to schedule

## 2020-02-14 ENCOUNTER — Other Ambulatory Visit: Payer: Self-pay | Admitting: Family Medicine

## 2020-02-15 ENCOUNTER — Ambulatory Visit (HOSPITAL_COMMUNITY): Payer: BC Managed Care – PPO

## 2020-02-26 ENCOUNTER — Telehealth (HOSPITAL_COMMUNITY): Payer: Self-pay | Admitting: Psychiatry

## 2020-02-26 NOTE — Telephone Encounter (Signed)
Called to schedule patient for f/u appts with Dr. Harrington Challenger and Vickii Chafe, LCSW. Left detailed voicemail

## 2020-03-06 ENCOUNTER — Other Ambulatory Visit: Payer: BC Managed Care – PPO | Admitting: Adult Health

## 2020-03-18 ENCOUNTER — Encounter: Payer: Self-pay | Admitting: Neurology

## 2020-03-18 ENCOUNTER — Ambulatory Visit: Payer: BC Managed Care – PPO | Admitting: Neurology

## 2020-03-30 ENCOUNTER — Other Ambulatory Visit: Payer: Self-pay | Admitting: Neurology

## 2020-04-04 DIAGNOSIS — Z87448 Personal history of other diseases of urinary system: Secondary | ICD-10-CM | POA: Diagnosis not present

## 2020-04-04 DIAGNOSIS — I129 Hypertensive chronic kidney disease with stage 1 through stage 4 chronic kidney disease, or unspecified chronic kidney disease: Secondary | ICD-10-CM | POA: Diagnosis not present

## 2020-04-04 DIAGNOSIS — G8929 Other chronic pain: Secondary | ICD-10-CM | POA: Diagnosis not present

## 2020-04-04 DIAGNOSIS — N179 Acute kidney failure, unspecified: Secondary | ICD-10-CM | POA: Diagnosis not present

## 2020-04-04 DIAGNOSIS — K222 Esophageal obstruction: Secondary | ICD-10-CM | POA: Diagnosis not present

## 2020-04-10 ENCOUNTER — Other Ambulatory Visit: Payer: BC Managed Care – PPO | Admitting: Adult Health

## 2020-04-30 ENCOUNTER — Ambulatory Visit: Payer: BC Managed Care – PPO | Admitting: Neurology

## 2020-05-17 ENCOUNTER — Encounter: Payer: Self-pay | Admitting: Family Medicine

## 2020-05-20 ENCOUNTER — Encounter: Payer: Self-pay | Admitting: Family Medicine

## 2020-05-20 ENCOUNTER — Telehealth: Payer: BC Managed Care – PPO | Admitting: Family Medicine

## 2020-05-20 ENCOUNTER — Other Ambulatory Visit: Payer: Self-pay

## 2020-05-20 VITALS — BP 145/90 | Ht 63.0 in | Wt 160.0 lb

## 2020-05-20 DIAGNOSIS — G35 Multiple sclerosis: Secondary | ICD-10-CM

## 2020-05-20 DIAGNOSIS — G4733 Obstructive sleep apnea (adult) (pediatric): Secondary | ICD-10-CM | POA: Diagnosis not present

## 2020-05-20 DIAGNOSIS — I10 Essential (primary) hypertension: Secondary | ICD-10-CM

## 2020-05-20 DIAGNOSIS — M79641 Pain in right hand: Secondary | ICD-10-CM

## 2020-05-20 DIAGNOSIS — M79642 Pain in left hand: Secondary | ICD-10-CM

## 2020-05-20 DIAGNOSIS — E785 Hyperlipidemia, unspecified: Secondary | ICD-10-CM

## 2020-05-20 DIAGNOSIS — E663 Overweight: Secondary | ICD-10-CM

## 2020-05-20 DIAGNOSIS — F418 Other specified anxiety disorders: Secondary | ICD-10-CM

## 2020-05-20 DIAGNOSIS — Z1231 Encounter for screening mammogram for malignant neoplasm of breast: Secondary | ICD-10-CM

## 2020-05-20 DIAGNOSIS — R2681 Unsteadiness on feet: Secondary | ICD-10-CM

## 2020-05-20 MED ORDER — MIRABEGRON ER 25 MG PO TB24
25.0000 mg | ORAL_TABLET | Freq: Every day | ORAL | 1 refills | Status: DC
Start: 2020-05-20 — End: 2020-12-04

## 2020-05-20 NOTE — Progress Notes (Signed)
Virtual Visit via Telephone Note  I connected with KEELEY SUSSMAN on 05/20/20 at 10:00 AM EST by telephone and verified that I am speaking with the correct person using two identifiers.  Location: Patient: home Provider: work   I discussed the limitations, risks, security and privacy concerns of performing an evaluation and management service by telephone and the availability of in person appointments. I also discussed with the patient that there may be a patient responsible charge related to this service. The patient expressed understanding and agreed to proceed.   History of Present Illness: 1 year h/o bilateral hand pain right worse than left with difficulty holding objects, opening objects and writing affected also, refer rheumatology Fell and hit her head while bending over to pick up an object , shortly afterChristmas , no more swelling, no lOC or bleeding Requests change in Neurologist as she reports having conflicting dx , is unsure if she has Ms and has some imbalance at times wit a recent fall Has OSA but does not use CPAP as she states poor fit, refer to pulmonary Ongoing stress but depression and anxiety fairly well controlled at this time C/o intermittent palpitations which cause her to feel tied and have to rest, not wanting Cardiology evaluation at this time,but will call in if she changes her  mind   Observations/Objective:  BP (!) 145/90    Ht 5\' 3"  (1.6 m)    Wt 160 lb (72.6 kg)    BMI 28.34 kg/m  Good communication with no confusion and intact memory. Alert and oriented x 3 No signs of respiratory distress during speech   Assessment and Plan: OSA (obstructive sleep apnea) Dx with severe OSA, states equipment not fitting and not using regularly, refer pulmonay  Overweight (BMI 25.0-29.9)  Patient re-educated about  the importance of commitment to a  minimum of 150 minutes of exercise per week as able.  The importance of healthy food choices with portion control  discussed, as well as eating regularly and within a 12 hour window most days. The need to choose "clean , green" food 50 to 75% of the time is discussed, as well as to make water the primary drink and set a goal of 64 ounces water daily.    Weight /BMI 05/20/2020 02/06/2020 09/26/2019  WEIGHT 160 lb 162 lb 162 lb  HEIGHT 5\' 3"  5\' 3"  5\' 3"   BMI 28.34 kg/m2 28.7 kg/m2 28.7 kg/m2  Some encounter information is confidential and restricted. Go to Review Flowsheets activity to see all data.      Essential hypertension DASH diet and commitment to daily physical activity for a minimum of 30 minutes discussed and encouraged, as a part of hypertension management. The importance of attaining a healthy weight is also discussed.  BP/Weight 05/20/2020 02/06/2020 09/26/2019 09/13/2019 09/13/2019 08/19/9561 12/01/5641  Systolic BP 329 518 841 - 660 630 -  Diastolic BP 90 84 58 - 70 64 -  Wt. (Lbs) 160 162 162 162 169.5 167 167  BMI 28.34 28.7 28.7 28.7 30.03 29.58 29.58  Some encounter information is confidential and restricted. Go to Review Flowsheets activity to see all data.  reporting elevated pressure , will review upcoming values in office setting     Depression with anxiety Managed by psych fairly well controlled currently  Hyperlipidemia LDL goal <100 Hyperlipidemia:Low fat diet discussed and encouraged.   Lipid Panel  Lab Results  Component Value Date   CHOL 252 (H) 02/06/2020   HDL 43 02/06/2020  [place] 169 (H) 02/06/2020   TRIG 214 (H) 02/06/2020   CHOLHDL 5.9 (H) 02/06/2020   Uncontrolled, needs updated lab    History of multiple sclerosis Was diagnosed by neurology in early 2000, however 2nd neurologist has a question mark on the diagnosis and she has neurologic concerns primarily related to gait , and  Requests 2nd opinion , referred to tertiary center    Follow Up Instructions:    I discussed the assessment and treatment plan with the patient. The patient was provided an  opportunity to ask questions and all were answered. The patient agreed with the plan and demonstrated an understanding of the instructions.   The patient was advised to call back or seek an in-person evaluation if the symptoms worsen or if the condition fails to improve as anticipated.  I provided 25 minutes of non-face-to-face time during this encounter.   [name], MD

## 2020-05-20 NOTE — Patient Instructions (Addendum)
F/u as before, call I f you need me sooner  You are referred to Neurology at [facility], and Pulmonary Specialist in [place], and alos to Rheumatology in [place], call if no appointment information from those offices within the next 1 to 2 weeks, so that we can asssist   Please schedule mammogram at checkout   Please get fasting lipid, cmp and EGFR 3 to 5 days before February appointment  Call if frequency, duration , or symptoms associated with palpitations worsen or become more concerning so that you may be referred to Cardiology, important to treat sleep apnea to protect your heart  It is important that you exercise regularly at least 30 minutes 5 times a week. If you develop chest pain, have severe difficulty breathing, or feel very tired, stop exercising immediately and seek medical attention     Heart-Healthy Eating Plan Heart-healthy meal planning includes:  Eating less unhealthy fats.  Eating more healthy fats.  Making other changes in your diet. Talk with your doctor or a diet specialist (dietitian) to create an eating plan that is right for you. What is my plan? Your doctor may recommend an eating plan that includes:  Total fat: ______% or less of total calories a day.  Saturated fat: ______% or less of total calories a day.  Cholesterol: less than _________mg a day. What are tips for following this plan? Cooking Avoid frying your food. Try to bake, boil, grill, or broil it instead. You can also reduce fat by:  Removing the skin from poultry.  Removing all visible fats from meats.  Steaming vegetables in water or broth. Meal planning  At meals, divide your plate into four equal parts: ? Fill one-half of your plate with vegetables and green salads. ? Fill one-fourth of your plate with whole grains. ? Fill one-fourth of your plate with lean protein foods.  Eat 4-5 servings of vegetables per day. A serving of vegetables is: ? 1 cup of raw or cooked  vegetables. ? 2 cups of raw leafy greens.  Eat 4-5 servings of fruit per day. A serving of fruit is: ? 1 medium whole fruit. ?  cup of dried fruit. ?  cup of fresh, frozen, or canned fruit. ?  cup of 100% fruit juice.  Eat more foods that have soluble fiber. These are apples, broccoli, carrots, beans, peas, and barley. Try to get 20-30 g of fiber per day.  Eat 4-5 servings of nuts, legumes, and seeds per week: ? 1 serving of dried beans or legumes equals  cup after being cooked. ? 1 serving of nuts is  cup. ? 1 serving of seeds equals 1 tablespoon.   General information  Eat more home-cooked food. Eat less restaurant, buffet, and fast food.  Limit or avoid alcohol.  Limit foods that are high in starch and sugar.  Avoid fried foods.  Lose weight if you are overweight.  Keep track of how much salt (sodium) you eat. This is important if you have high blood pressure. Ask your doctor to tell you more about this.  Try to add vegetarian meals each week. Fats  Choose healthy fats. These include olive oil and canola oil, flaxseeds, walnuts, almonds, and seeds.  Eat more omega-3 fats. These include salmon, mackerel, sardines, tuna, flaxseed oil, and ground flaxseeds. Try to eat fish at least 2 times each week.  Check food labels. Avoid foods with trans fats or high amounts of saturated fat.  Limit saturated fats. ? These are often found  in animal products, such as meats, butter, and cream. ? These are also found in plant foods, such as palm oil, palm kernel oil, and coconut oil.  Avoid foods with partially hydrogenated oils in them. These have trans fats. Examples are stick margarine, some tub margarines, cookies, crackers, and other baked goods. What foods can I eat? Fruits All fresh, canned (in natural juice), or frozen fruits. Vegetables Fresh or frozen vegetables (raw, steamed, roasted, or grilled). Green salads. Grains Most grains. Choose whole wheat and whole grains  most of the time. Rice and pasta, including brown rice and pastas made with whole wheat. Meats and other proteins Lean, well-trimmed beef, veal, pork, and lamb. Chicken and [place] without skin. All fish and shellfish. Wild duck, rabbit, pheasant, and venison. Egg whites or low-cholesterol egg substitutes. Dried beans, peas, lentils, and tofu. Seeds and most nuts. Dairy Low-fat or nonfat cheeses, including ricotta and mozzarella. Skim or 1% milk that is liquid, powdered, or evaporated. Buttermilk that is made with low-fat milk. Nonfat or low-fat yogurt. Fats and oils Non-hydrogenated (trans-free) margarines. Vegetable oils, including soybean, sesame, sunflower, olive, peanut, safflower, corn, canola, and cottonseed. Salad dressings or mayonnaise made with a vegetable oil. Beverages Mineral water. Coffee and tea. Diet carbonated beverages. Sweets and desserts Sherbet, gelatin, and fruit ice. Small amounts of dark chocolate. Limit all sweets and desserts. Seasonings and condiments All seasonings and condiments. The items listed above may not be a complete list of foods and drinks you can eat. Contact a dietitian for more options. What foods should I avoid? Fruits Canned fruit in heavy syrup. Fruit in cream or butter sauce. Fried fruit. Limit coconut. Vegetables Vegetables cooked in cheese, cream, or butter sauce. Fried vegetables. Grains Breads that are made with saturated or trans fats, oils, or whole milk. Croissants. Sweet rolls. Donuts. High-fat crackers, such as cheese crackers. Meats and other proteins Fatty meats, such as hot dogs, ribs, sausage, bacon, rib-eye roast or steak. High-fat deli meats, such as salami and bologna. Caviar. Domestic duck and goose. Organ meats, such as liver. Dairy Cream, sour cream, cream cheese, and creamed cottage cheese. Whole-milk cheeses. Whole or 2% milk that is liquid, evaporated, or condensed. Whole buttermilk. Cream sauce or high-fat cheese sauce.  Yogurt that is made from whole milk. Fats and oils Meat fat, or shortening. Cocoa butter, hydrogenated oils, palm oil, coconut oil, palm kernel oil. Solid fats and shortenings, including bacon fat, salt pork, lard, and butter. Nondairy cream substitutes. Salad dressings with cheese or sour cream. Beverages Regular sodas and juice drinks with added sugar. Sweets and desserts Frosting. Pudding. Cookies. Cakes. Pies. Milk chocolate or white chocolate. Buttered syrups. Full-fat ice cream or ice cream drinks. The items listed above may not be a complete list of foods and drinks to avoid. Contact a dietitian for more information. Summary  Heart-healthy meal planning includes eating less unhealthy fats, eating more healthy fats, and making other changes in your diet.  Eat a balanced diet. This includes fruits and vegetables, low-fat or nonfat dairy, lean protein, nuts and legumes, whole grains, and heart-healthy oils and fats. This information is not intended to replace advice given to you by your health care provider. Make sure you discuss any questions you have with your health care provider. Document Revised: 06/17/2017 Document Reviewed: 05/21/2017 Elsevier Patient Education  2021 [employer].

## 2020-05-20 NOTE — Assessment & Plan Note (Signed)
Dx with severe OSA, states equipment not fitting and not using regularly, refer pulmonay

## 2020-05-22 ENCOUNTER — Encounter: Payer: Self-pay | Admitting: Family Medicine

## 2020-05-22 NOTE — Assessment & Plan Note (Signed)
  Patient re-educated about  the importance of commitment to a  minimum of 150 minutes of exercise per week as able.  The importance of healthy food choices with portion control discussed, as well as eating regularly and within a 12 hour window most days. The need to choose "clean , green" food 50 to 75% of the time is discussed, as well as to make water the primary drink and set a goal of 64 ounces water daily.    Weight /BMI 05/20/2020 02/06/2020 09/26/2019  WEIGHT 160 lb 162 lb 162 lb  HEIGHT 5\' 3"  5\' 3"  5\' 3"   BMI 28.34 kg/m2 28.7 kg/m2 28.7 kg/m2  Some encounter information is confidential and restricted. Go to Review Flowsheets activity to see all data.

## 2020-05-22 NOTE — Assessment & Plan Note (Signed)
Hyperlipidemia:Low fat diet discussed and encouraged.   Lipid Panel  Lab Results  Component Value Date   CHOL 252 (H) 02/06/2020   HDL 43 02/06/2020   LDLCALC 169 (H) 02/06/2020   TRIG 214 (H) 02/06/2020   CHOLHDL 5.9 (H) 02/06/2020   Uncontrolled, needs updated lab

## 2020-05-22 NOTE — Assessment & Plan Note (Signed)
Managed by psych fairly well controlled currently

## 2020-05-22 NOTE — Assessment & Plan Note (Signed)
Was diagnosed by neurology in early 2000, however 2nd neurologist has a question mark on the diagnosis and she has neurologic concerns primarily related to gait , and  Requests 2nd opinion , referred to tertiary center

## 2020-05-22 NOTE — Assessment & Plan Note (Signed)
DASH diet and commitment to daily physical activity for a minimum of 30 minutes discussed and encouraged, as a part of hypertension management. The importance of attaining a healthy weight is also discussed.  BP/Weight 05/20/2020 02/06/2020 09/26/2019 09/13/2019 09/13/2019 05/19/4823 0/0/3704  Systolic BP 888 916 945 - 038 882 -  Diastolic BP 90 84 58 - 70 64 -  Wt. (Lbs) 160 162 162 162 169.5 167 167  BMI 28.34 28.7 28.7 28.7 30.03 29.58 29.58  Some encounter information is confidential and restricted. Go to Review Flowsheets activity to see all data.  reporting elevated pressure , will review upcoming values in office setting

## 2020-06-05 ENCOUNTER — Encounter: Payer: Self-pay | Admitting: Adult Health

## 2020-06-05 ENCOUNTER — Ambulatory Visit (INDEPENDENT_AMBULATORY_CARE_PROVIDER_SITE_OTHER): Payer: BC Managed Care – PPO | Admitting: Adult Health

## 2020-06-05 ENCOUNTER — Other Ambulatory Visit: Payer: Self-pay

## 2020-06-05 VITALS — BP 135/79 | HR 90 | Ht 62.5 in | Wt 160.5 lb

## 2020-06-05 DIAGNOSIS — I10 Essential (primary) hypertension: Secondary | ICD-10-CM | POA: Diagnosis not present

## 2020-06-05 DIAGNOSIS — K649 Unspecified hemorrhoids: Secondary | ICD-10-CM | POA: Diagnosis not present

## 2020-06-05 DIAGNOSIS — Z1211 Encounter for screening for malignant neoplasm of colon: Secondary | ICD-10-CM | POA: Diagnosis not present

## 2020-06-05 DIAGNOSIS — Z01419 Encounter for gynecological examination (general) (routine) without abnormal findings: Secondary | ICD-10-CM | POA: Insufficient documentation

## 2020-06-05 DIAGNOSIS — Z90710 Acquired absence of both cervix and uterus: Secondary | ICD-10-CM | POA: Insufficient documentation

## 2020-06-05 DIAGNOSIS — E785 Hyperlipidemia, unspecified: Secondary | ICD-10-CM | POA: Diagnosis not present

## 2020-06-05 LAB — HEMOCCULT GUIAC POC 1CARD (OFFICE): Fecal Occult Blood, POC: NEGATIVE

## 2020-06-05 NOTE — Progress Notes (Signed)
Patient ID: Pamela Walters, female   DOB: Jul 17, 1960, 60 y.o.   MRN: [id number] History of Present Illness: Pamela Walters is a 60 year old white female, married, sp hysterectomy, in for well woman gyn exam.She is a Marine scientist in [employer]. PCP is Dr Moshe Cipro.   Current Medications, Allergies, Past Medical History, Past Surgical History, Family History and Social History were reviewed in [employer] record.     Review of Systems: Patient denies any headaches, hearing loss, fatigue, blurred vision, shortness of breath, chest pain, abdominal pain, problems with bowel movements, urination(on meds for OAB), or intercourse(has dryness at times). No joint pain or mood swings.    Physical Exam:BP 135/79 (BP Location: Left Arm, Patient Position: Sitting, Cuff Size: Normal)   Pulse 90   Ht 5' 2.5" (1.588 m)   Wt 160 lb 8 oz (72.8 kg)   BMI 28.89 kg/m  General:  Well developed, well nourished, no acute distress Skin:  Warm and dry Neck:  Midline trachea, normal thyroid, good ROM, no lymphadenopathy Lungs; Clear to auscultation bilaterally Breast:  No dominant palpable mass, retraction, or nipple discharge Cardiovascular: Regular rate and rhythm Abdomen:  Soft, non tender, no hepatosplenomegaly Pelvic:  External genitalia is normal in appearance, no lesions.  The vagina is pale with loss of moisture and rugae. Urethra has no lesions or masses. The cervix and uterus are absent. No adnexal masses or tenderness noted.Bladder is non tender, no masses felt. Rectal: Good sphincter tone, no polyps, + hemorrhoids felt.  Hemoccult negative. Extremities/musculoskeletal:  No swelling or varicosities noted, no clubbing or cyanosis Psych:  No mood changes, alert and cooperative,seems happy AA is 0  Fall risk is low PHQ 9 score is 16, on meds and sees Dr Harrington Challenger, no SI GAD 7 score is  14  [facility] - 06/05/20 1110      Pregnancy Intention Screening   Does the patient want to become pregnant in  the next year? No    Does the patient's partner want to become pregnant in the next year? No    Would the patient like to discuss contraceptive options today? No      Contraception Wrap Up   Contraception Counseling Provided No         Examination chaperoned by Levy Pupa LPN   Impression and Plan:  1. Encounter for well woman exam with routine gynecological exam Physical with PCP  No pap needed  Mammogram yearly Labs with PCP  Colonoscopy per GI I will be glad to see as needed   2. Encounter for screening fecal occult blood testing  3. Hemorrhoids, unspecified hemorrhoid type   4. S/P hysterectomy Try astroglide with sex or EVOO or coconut oil

## 2020-06-06 LAB — CMP14+EGFR
ALT: 12 IU/L (ref 0–32)
AST: 22 IU/L (ref 0–40)
Albumin/Globulin Ratio: 1.9 (ref 1.2–2.2)
Albumin: 4.8 g/dL (ref 3.8–4.8)
Alkaline Phosphatase: 111 IU/L (ref 44–121)
BUN/Creatinine Ratio: 12 (ref 12–28)
BUN: 13 mg/dL (ref 8–27)
Bilirubin Total: 0.3 mg/dL (ref 0.0–1.2)
CO2: 28 mmol/L (ref 20–29)
Calcium: 9.8 mg/dL (ref 8.7–10.3)
Chloride: 96 mmol/L (ref 96–106)
Creatinine, Ser: 1.06 mg/dL — ABNORMAL HIGH (ref 0.57–1.00)
GFR calc Af Amer: 65 mL/min/{1.73_m2} (ref >59)
GFR calc non Af Amer: 57 mL/min/{1.73_m2} — ABNORMAL LOW (ref >59)
Globulin, Total: 2.5 g/dL (ref 1.5–4.5)
Glucose: 81 mg/dL (ref 65–99)
Potassium: 3.8 mmol/L (ref 3.5–5.2)
Sodium: 141 mmol/L (ref 134–144)
Total Protein: 7.3 g/dL (ref 6.0–8.5)

## 2020-06-06 LAB — LIPID PANEL
Chol/HDL Ratio: 5.8 ratio — ABNORMAL HIGH (ref 0.0–4.4)
Cholesterol, Total: 262 mg/dL — ABNORMAL HIGH (ref 100–199)
HDL: 45 mg/dL (ref >39)
LDL Chol Calc (NIH): 179 mg/dL — ABNORMAL HIGH (ref 0–99)
Triglycerides: 204 mg/dL — ABNORMAL HIGH (ref 0–149)
VLDL Cholesterol Cal: 38 mg/dL (ref 5–40)

## 2020-06-10 ENCOUNTER — Encounter: Payer: Self-pay | Admitting: Family Medicine

## 2020-06-10 ENCOUNTER — Encounter: Payer: Self-pay | Admitting: Pulmonary Disease

## 2020-06-10 ENCOUNTER — Telehealth: Payer: BC Managed Care – PPO | Admitting: Family Medicine

## 2020-06-10 ENCOUNTER — Other Ambulatory Visit: Payer: Self-pay

## 2020-06-10 VITALS — BP 136/82 | Ht 63.0 in | Wt 160.0 lb

## 2020-06-10 DIAGNOSIS — F418 Other specified anxiety disorders: Secondary | ICD-10-CM

## 2020-06-10 DIAGNOSIS — S0990XA Unspecified injury of head, initial encounter: Secondary | ICD-10-CM | POA: Insufficient documentation

## 2020-06-10 DIAGNOSIS — G4452 New daily persistent headache (NDPH): Secondary | ICD-10-CM

## 2020-06-10 DIAGNOSIS — G4733 Obstructive sleep apnea (adult) (pediatric): Secondary | ICD-10-CM

## 2020-06-10 DIAGNOSIS — I10 Essential (primary) hypertension: Secondary | ICD-10-CM | POA: Diagnosis not present

## 2020-06-10 DIAGNOSIS — S0990XS Unspecified injury of head, sequela: Secondary | ICD-10-CM

## 2020-06-10 DIAGNOSIS — E785 Hyperlipidemia, unspecified: Secondary | ICD-10-CM

## 2020-06-10 MED ORDER — AMLODIPINE BESYLATE 5 MG PO TABS: 5.0000 mg | ORAL_TABLET | Freq: Every day | ORAL | 3 refills | Status: DC

## 2020-06-10 MED ORDER — LORAZEPAM 1 MG PO TABS: ORAL_TABLET | ORAL | 0 refills | Status: DC

## 2020-06-10 NOTE — Assessment & Plan Note (Signed)
Hit head within 1 week of Christmas, hit marble counter top in bathroom when bending to pick upo a towel, approx 6 weeks ago forehead was hit , has swelling in the area to left above left eye and jaw, has pain over left and cehek intermiitent, 24/7 up to a 7, has to lay down for a while, new headache

## 2020-06-10 NOTE — Patient Instructions (Addendum)
F/U first week in June, call if you need me before  Please schedule the appointments for Neurology and Pulmonary from Jan visit and let pt know, if able  Please schedule Brain scan, soonest appt,  and let pt know  Fasting lipid, cmp and EGFr last week in May  NO MORE NUTS, egg yolk, oils, butter, margarine and lard, cholesterol will get [facility] better  Tablets sent to be taken before scan  Amlodipine refill sent to [facility] as requested  It is important that you exercise regularly at least 30 minutes 5 times a week. If you develop chest pain, have severe difficulty breathing, or feel very tired, stop exercising immediately and seek medical attention  Think about what you will eat, plan ahead. Choose " clean, green, fresh or frozen" over canned, processed or packaged foods which are more sugary, salty and fatty. 70 to 75% of food eaten should be vegetables and fruit. Three meals at set times with snacks allowed between meals, but they must be fruit or vegetables. Aim to eat over a 12 hour period , example 7 am to 7 pm, and STOP after  your last meal of the day. Drink water,generally about 64 ounces per day, no other drink is as healthy. Fruit juice is best enjoyed in a healthy way, by EATING the fruit. Thanks for choosing [facility], we consider it a privelige to serve you.

## 2020-06-10 NOTE — Progress Notes (Signed)
Virtual Visit via Telephone Note  I connected with Pamela Walters on 06/10/20 at  9:40 AM EST by telephone and verified that I am speaking with the correct person using two identifiers.  Location: Patient: home Provider: office   I discussed the limitations, risks, security and privacy concerns of performing an evaluation and management service by telephone and the availability of in person appointments. I also discussed with the patient that there may be a patient responsible charge related to this service. The patient expressed understanding and agreed to proceed.   History of Present Illness: F/U chronic problems and address any new or current concerns. Review and update medications and allergies. Review recent lab and radiologic data . Update routine health maintainace. Review an encourage improved health habits to include nutrition, exercise and  sleep .  Denies recent fever or chills. Denies sinus pressure, nasal congestion, ear pain or sore throat. Denies chest congestion, productive cough or wheezing. Denies chest pains, palpitations and leg swelling Denies abdominal pain, nausea, vomiting,diarrhea or constipation.   Denies dysuria, frequency, hesitancy or incontinence. Denies joint pain, swelling and limitation in mobility. C/o new daily persistent headache following direct head trauma shortly after Christmas. No memory impairment reported, no new weakness or numbness Chronic  Depression followed by Psych and doing fairly well currently, denies nxiety or insomnia. Denies skin break down or rash.       Observations/Objective: BP 136/82   Ht 5\' 3"  (1.6 m)   Wt 160 lb (72.6 kg)   BMI 28.34 kg/m  Good communication with no confusion and intact memory. Alert and oriented x 3 No signs of respiratory distress during speech    Assessment and Plan: Head trauma Hit head within 1 week of Christmas, hit marble counter top in bathroom when bending to pick upo a towel,  approx 6 weeks ago forehead was hit , has swelling in the area to left above left eye and jaw, has pain over left and cehek intermiitent, 24/7 up to a 7, has to lay down for a while, new headache  Essential hypertension DASH diet and commitment to daily physical activity for a minimum of 30 minutes discussed and encouraged, as a part of hypertension management. The importance of attaining a healthy weight is also discussed. Controlled, no change in medication   BP/Weight 06/10/2020 06/05/2020 05/20/2020 02/06/2020 09/26/2019 09/13/2019 08/19/9561  Systolic BP 875 643 329 518 841 - 660  Diastolic BP 82 79 90 84 58 - 70  Wt. (Lbs) 160 160.5 160 162 162 162 169.5  BMI 28.34 28.89 28.34 28.7 28.7 28.7 30.03  Some encounter information is confidential and restricted. Go to Review Flowsheets activity to see all data.       Head trauma, sequela Needs brain scan as reports [facility] following the injury  Depression with anxiety Improved , still not adequately treated, managed by psych  Hyperlipidemia LDL goal <100 Hyperlipidemia:Low fat diet discussed and encouraged.   Lipid Panel  Lab Results  Component Value Date   CHOL 262 (H) 06/05/2020   HDL 45 06/05/2020   LDLCALC 179 (H) 06/05/2020   TRIG 204 (H) 06/05/2020   CHOLHDL 5.8 (H) 06/05/2020   Uncontrolled and not at goal, dietary change needed    OSA (obstructive sleep apnea) Needs to establish with pulmonary for management    Follow Up Instructions:    I discussed the assessment and treatment plan with the patient. The patient was provided an opportunity to ask questions and all were answered. The  patient agreed with the plan and demonstrated an understanding of the instructions.   The patient was advised to call back or seek an in-person evaluation if the symptoms worsen or if the condition fails to improve as anticipated.  I provided 26 minutes of non-face-to-face time during this encounter.   [name], MD

## 2020-06-16 ENCOUNTER — Encounter: Payer: Self-pay | Admitting: Family Medicine

## 2020-06-16 DIAGNOSIS — G4452 New daily persistent headache (NDPH): Secondary | ICD-10-CM | POA: Insufficient documentation

## 2020-06-16 DIAGNOSIS — S0990XS Unspecified injury of head, sequela: Secondary | ICD-10-CM | POA: Insufficient documentation

## 2020-06-16 NOTE — Assessment & Plan Note (Signed)
Needs brain scan as reports [facility] following the injury

## 2020-06-16 NOTE — Assessment & Plan Note (Signed)
Hyperlipidemia:Low fat diet discussed and encouraged.   Lipid Panel  Lab Results  Component Value Date   CHOL 262 (H) 06/05/2020   HDL 45 06/05/2020   LDLCALC 179 (H) 06/05/2020   TRIG 204 (H) 06/05/2020   CHOLHDL 5.8 (H) 06/05/2020   Uncontrolled and not at goal, dietary change needed

## 2020-06-16 NOTE — Assessment & Plan Note (Signed)
Improved , still not adequately treated, managed by psych

## 2020-06-16 NOTE — Assessment & Plan Note (Signed)
Needs to establish with pulmonary for management

## 2020-06-16 NOTE — Assessment & Plan Note (Signed)
DASH diet and commitment to daily physical activity for a minimum of 30 minutes discussed and encouraged, as a part of hypertension management. The importance of attaining a healthy weight is also discussed. Controlled, no change in medication   BP/Weight 06/10/2020 06/05/2020 05/20/2020 02/06/2020 09/26/2019 09/13/2019 10/14/5091  Systolic BP 267 124 580 998 338 - 250  Diastolic BP 82 79 90 84 58 - 70  Wt. (Lbs) 160 160.5 160 162 162 162 169.5  BMI 28.34 28.89 28.34 28.7 28.7 28.7 30.03  Some encounter information is confidential and restricted. Go to Review Flowsheets activity to see all data.

## 2020-06-21 NOTE — Progress Notes (Signed)
Office Visit Note  Patient: Pamela Walters             Date of Birth: Aug 06, 1960           MRN: [id number]             PCP: Fayrene Helper, MD Referring: Fayrene Helper, MD Visit Date: 07/05/2020 Occupation: @GUAROCC @  Subjective:  Pain in multiple joints.   History of Present Illness: Pamela Walters is a 60 y.o. female seen in consultation per request of her PCP for evaluation of joint pain.  According to patient she has had history of pain in her hands for the last 5 years.  She has noticed worsening of the joint symptoms.  She is also noticed deformities in her hands.  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  She states lately she has been also having some discomfort in her left elbow which she relates to her spring cleaning.  She has been diagnosed with degenerative disc disease and spondylolisthesis by Dr. Rolena Infante and followed by Dr. Rolena Infante.  She also has discomfort in her bilateral knee joints for over 10 years.  She has been having progressively increased pain in her knee joints.  No history of joint swelling.  There is no family history of autoimmune disease.  Gravida 4, para 3, miscarriages 1.  Activities of Daily Living:  Patient reports morning stiffness for 2-3 hours.   Patient Reports nocturnal pain.  Difficulty dressing/grooming: Denies Difficulty climbing stairs: Reports Difficulty getting out of chair: Reports Difficulty using hands for taps, buttons, cutlery, and/or writing: Reports  Review of Systems  Constitutional: Positive for fatigue. Negative for night sweats, weight gain and weight loss.  HENT: Positive for mouth dryness. Negative for mouth sores, trouble swallowing, trouble swallowing and nose dryness.   Eyes: Negative for pain, redness, itching, visual disturbance and dryness.  Respiratory: Negative for cough, shortness of breath and difficulty breathing.   Cardiovascular: Negative for chest pain, palpitations, hypertension, irregular  heartbeat and swelling in legs/feet.  Gastrointestinal: Negative for blood in stool, constipation and diarrhea.  Endocrine: Negative for increased urination.  Genitourinary: Negative for difficulty urinating and vaginal dryness.  Musculoskeletal: Positive for arthralgias, joint pain, myalgias, morning stiffness, muscle tenderness and myalgias. Negative for joint swelling and muscle weakness.  Skin: Negative for color change, rash, hair loss, redness, skin tightness, ulcers and sensitivity to sunlight.  Allergic/Immunologic: Negative for susceptible to infections.  Neurological: Positive for numbness, headaches and weakness. Negative for dizziness, memory loss and night sweats.  Hematological: Positive for bruising/bleeding tendency. Negative for swollen glands.  Psychiatric/Behavioral: Positive for depressed mood and sleep disturbance. Negative for confusion. The patient is nervous/anxious.     PMFS History:  Patient Active Problem List   Diagnosis Date Noted  . New daily persistent headache ([facility]) 06/16/2020  . Head trauma, sequela 06/16/2020  . Head trauma 06/10/2020  . Hemorrhoids 06/05/2020  . S/P hysterectomy 06/05/2020  . Other headache syndrome 09/13/2019  . Fidgeting 09/13/2019  . Dysphagia 06/24/2019  . History of 2019 novel coronavirus disease (COVID-19) 06/20/2019  . Knee pain 01/30/2019  . Recurrent cold sores 03/11/2018  . Dyspepsia 03/07/2018  . History of multiple sclerosis ([place]) 02/11/2018  . Neck pain 02/11/2018  . History of Bell's palsy 02/11/2018  . Abnormal brain MRI 05/19/2017  . Carpal tunnel syndrome, bilateral 05/19/2017  . Abnormal stress test   . Allergic rhinitis 06/20/2015  . Dysesthesia 03/04/2015  . OSA (obstructive sleep apnea) 10/01/2014  .  Increased homocysteine 10/01/2014  . Urinary incontinence 05/30/2014  . Back pain with radiation 02/05/2013  . DISORDER OF BONE AND CARTILAGE UNSPECIFIED 12/16/2009  . Overweight (BMI 25.0-29.9) 04/08/2008   . Depression with anxiety 12/18/2007  . Headache 12/18/2007  . Hyperlipidemia LDL goal <100 12/16/2007  . Essential hypertension 12/16/2007    Past Medical History:  Diagnosis Date  . Allergy   . Anemia   . Anxiety   . Arthritis   . Back pain   . Bell's palsy 08/2017  . Depression   . Depression    Phreesia 05/19/2020  . GERD (gastroesophageal reflux disease)   . Hepatic steatosis   . Hiatal hernia   . History of Bell's palsy 02/11/2018  . Hyperlipidemia   . Hypertension   . Internal hemorrhoids   . MS (multiple sclerosis) ([place])    2007  . Multiple sclerosis ([place])   . Multiple sclerosis ([place]) 04/04/2008   Qualifier: Diagnosis of  By: Moshe Cipro MD, Joycelyn Schmid    . Schatzki's ring   . Sleep apnea    wears CPAP    Family History  Problem Relation Age of Onset  . Hypertension Mother   . Hyperlipidemia Mother   . Depression Mother   . Hypertension Father   . Hypertension Sister   . High Cholesterol Sister   . Healthy Son   . Healthy Daughter   . Colon cancer Neg Hx   . Esophageal cancer Neg Hx   . Rectal cancer Neg Hx   . Stomach cancer Neg Hx   . Colon polyps Neg Hx    Past Surgical History:  Procedure Laterality Date  . ABDOMINAL HYSTERECTOMY  2005   fibroids  . CARDIAC CATHETERIZATION N/A 05/04/2016   Procedure: Left Heart Cath and Coronary Angiography;  Surgeon: Jettie Booze, MD;  Location: [facility];  Service: Cardiovascular;  Laterality: N/A;  . COLONOSCOPY    . ESOPHAGOGASTRODUODENOSCOPY N/A 09/18/2013   Procedure: ESOPHAGOGASTRODUODENOSCOPY (EGD);  Surgeon: Jerene Bears, MD;  Location: [facility];  Service: Gastroenterology;  Laterality: N/A;  . UPPER GASTROINTESTINAL ENDOSCOPY    . WISDOM TOOTH EXTRACTION     Social History   Social History Narrative  . Not on file   Immunization History  Administered Date(s) Administered  . Influenza Split 01/26/2014  . Influenza,inj,Quad PF,6+ Mos 02/06/2020  . PFIZER(Purple Top)SARS-COV-2  Vaccination 08/12/2019, 09/05/2019, 06/05/2020  . Tdap 03/17/2013  . Zoster Recombinat (Shingrix) 10/20/2018, 01/30/2019     Objective: Vital Signs: BP (!) 147/81 (BP Location: Right Arm, Patient Position: Sitting, Cuff Size: Normal)   Pulse 82   Resp 15   Ht 5' 2.5" (1.588 m)   Wt 167 lb (75.8 kg)   BMI 30.06 kg/m    Physical Exam Vitals and nursing note reviewed.  Constitutional:      Appearance: She is well-developed.  HENT:     Head: Normocephalic and atraumatic.  Eyes:     Conjunctiva/sclera: Conjunctivae normal.  Cardiovascular:     Rate and Rhythm: Normal rate and regular rhythm.     Heart sounds: Normal heart sounds.  Pulmonary:     Effort: Pulmonary effort is normal.     Breath sounds: Normal breath sounds.  Abdominal:     General: Bowel sounds are normal.     Palpations: Abdomen is soft.  Musculoskeletal:     Cervical back: Normal range of motion.  Lymphadenopathy:     Cervical: No cervical adenopathy.  Skin:    General: Skin is  warm and dry.     Capillary Refill: Capillary refill takes less than 2 seconds.  Neurological:     Mental Status: She is alert and oriented to person, place, and time.  Psychiatric:        Behavior: Behavior normal.      Musculoskeletal Exam: C-spine was in good range of motion.  She has painful range of motion of her lumbar spine. There was no point tenderness over the lumbar spine.  No SI joint tenderness was noted.  Shoulder joints, elbow joints, wrist joints with good range of motion.  She had left lateral epicondyle area tenderness.  No synovitis was noted over wrist joints are MCP joints.  Bilateral PIP and DIP prominence was noted.  Hip joints and knee joints were in good range of motion.  She had discomfort range of motion of bilateral knee joints.  No warmth swelling effusion was noted.  There was no tenderness over ankles or MTPs.  CDAI Exam: CDAI Score: - Patient Global: -; Provider Global: - Swollen: -; Tender: - Joint  Exam 07/05/2020   No joint exam has been documented for this visit   There is currently no information documented on the homunculus. Go to the Rheumatology activity and complete the homunculus joint exam.  Investigation: No additional findings.  Imaging: No results found.  Recent Labs: Lab Results  Component Value Date   WBC 6.9 02/06/2020   HGB 13.1 02/06/2020   PLT 363 02/06/2020   NA 141 06/05/2020   K 3.8 06/05/2020   CL 96 06/05/2020   CO2 28 06/05/2020   GLUCOSE 81 06/05/2020   BUN 13 06/05/2020   CREATININE 1.06 (H) 06/05/2020   BILITOT 0.3 06/05/2020   ALKPHOS 111 06/05/2020   AST 22 06/05/2020   ALT 12 06/05/2020   PROT 7.3 06/05/2020   ALBUMIN 4.8 06/05/2020   CALCIUM 9.8 06/05/2020   GFRAA 65 06/05/2020    Speciality Comments: No specialty comments available.  Procedures:  No procedures performed Allergies: Patient has no known allergies.   Assessment / Plan:     Visit Diagnoses: Pain in both hands -she complains of pain and discomfort in her bilateral hands.  No swelling or synovitis was noted.  She has bilateral PIP and DIP thickening and CMC prominence.  Joint protection muscle strengthening was discussed.  A handout on hand exercises was given.- Plan: XR Hand 2 View Right, XR Hand 2 View Left.  X-rays are consistent with severe osteoarthritis.  X-ray findings were discussed with the patient.  Carpal tunnel syndrome, bilateral - occasional symptoms. She wears CTS braces at night.  Lateral epicondylitis, left elbow-related to spring cleaning.  Stretching exercises were discussed.  Chronic pain of both knees -she is having a lot of pain and discomfort in her knee joints.  She has difficulty walking.  No warmth swelling or effusion was noted.  A handout on lower extremity muscle strengthening exercises was given.  Plan: XR KNEE 3 VIEW RIGHT, XR KNEE 3 VIEW LEFT.  X-ray of bilateral knee joints showed severe medial compartment narrowing consistent with severe  osteoarthritis and severe chondromalacia patella.  X-ray findings were discussed with the patient.  She is not interested in getting total knee replacement.  She would like to have cortisone injection at the follow-up visit.  If she has an adequate response to cortisone injection may consider Visco supplement injections in the future.  Weight loss and muscle strengthening was emphasized.  DDD (degenerative disc disease), lumbar -she gives history  of lower back pain for many years.  She has seen Dr. Rolena Infante in the past.  She states she was diagnosed with a spondylolisthesis.  She continues to have lower back pain without any radiculopathy.  Plan: XR Lumbar Spine 2-3 Views x-ray showed multilevel spondylosis facet joint arthropathy and L4-L5 spondylolisthesis.  X-ray findings were discussed with the patient.  She continues to have some lower back discomfort.  Handout on back strengthening exercises were given.  Essential hypertension-her blood pressure was elevated today.  Have advised her to monitor blood pressure closely.  Hyperlipidemia LDL goal <100  Pharyngoesophageal dysphagia - gets esophageal streching, followed by Dr. Hilarie Fredrickson.  History of Bell's palsy - 2020  Recurrent cold sores  Increased homocysteine  S/P hysterectomy  Other urinary incontinence  Depression with anxiety  OSA (obstructive sleep apnea)  History of 2019 novel coronavirus disease (COVID-19) - 04/2019  Orders: Orders Placed This Encounter  Procedures  . XR Hand 2 View Right  . XR Hand 2 View Left  . XR KNEE 3 VIEW RIGHT  . XR KNEE 3 VIEW LEFT  . XR Lumbar Spine 2-3 Views   No orders of the defined types were placed in this encounter.     Follow-Up Instructions: Return for Osteoarthritis.   Bo Merino, MD  Note - This record has been created using Editor, commissioning.  Chart creation errors have been sought, but may not always  have been located. Such creation errors do not reflect on  the standard  of medical care.

## 2020-06-24 ENCOUNTER — Ambulatory Visit (HOSPITAL_COMMUNITY): Payer: BC Managed Care – PPO

## 2020-07-04 ENCOUNTER — Institutional Professional Consult (permissible substitution): Payer: BC Managed Care – PPO | Admitting: Pulmonary Disease

## 2020-07-05 ENCOUNTER — Ambulatory Visit: Payer: Self-pay

## 2020-07-05 ENCOUNTER — Ambulatory Visit: Payer: BC Managed Care – PPO | Admitting: Rheumatology

## 2020-07-05 ENCOUNTER — Encounter: Payer: Self-pay | Admitting: Rheumatology

## 2020-07-05 ENCOUNTER — Other Ambulatory Visit: Payer: Self-pay

## 2020-07-05 VITALS — BP 147/81 | HR 82 | Resp 15 | Ht 62.5 in | Wt 167.0 lb

## 2020-07-05 DIAGNOSIS — G35 Multiple sclerosis: Secondary | ICD-10-CM

## 2020-07-05 DIAGNOSIS — N39498 Other specified urinary incontinence: Secondary | ICD-10-CM

## 2020-07-05 DIAGNOSIS — G5603 Carpal tunnel syndrome, bilateral upper limbs: Secondary | ICD-10-CM | POA: Diagnosis not present

## 2020-07-05 DIAGNOSIS — M7712 Lateral epicondylitis, left elbow: Secondary | ICD-10-CM | POA: Diagnosis not present

## 2020-07-05 DIAGNOSIS — M79641 Pain in right hand: Secondary | ICD-10-CM

## 2020-07-05 DIAGNOSIS — Z8669 Personal history of other diseases of the nervous system and sense organs: Secondary | ICD-10-CM

## 2020-07-05 DIAGNOSIS — M51369 Other intervertebral disc degeneration, lumbar region without mention of lumbar back pain or lower extremity pain: Secondary | ICD-10-CM

## 2020-07-05 DIAGNOSIS — M25561 Pain in right knee: Secondary | ICD-10-CM

## 2020-07-05 DIAGNOSIS — F418 Other specified anxiety disorders: Secondary | ICD-10-CM

## 2020-07-05 DIAGNOSIS — B001 Herpesviral vesicular dermatitis: Secondary | ICD-10-CM

## 2020-07-05 DIAGNOSIS — R1314 Dysphagia, pharyngoesophageal phase: Secondary | ICD-10-CM

## 2020-07-05 DIAGNOSIS — M5136 Other intervertebral disc degeneration, lumbar region: Secondary | ICD-10-CM | POA: Diagnosis not present

## 2020-07-05 DIAGNOSIS — Z90710 Acquired absence of both cervix and uterus: Secondary | ICD-10-CM

## 2020-07-05 DIAGNOSIS — R7989 Other specified abnormal findings of blood chemistry: Secondary | ICD-10-CM

## 2020-07-05 DIAGNOSIS — I10 Essential (primary) hypertension: Secondary | ICD-10-CM

## 2020-07-05 DIAGNOSIS — M79642 Pain in left hand: Secondary | ICD-10-CM

## 2020-07-05 DIAGNOSIS — G8929 Other chronic pain: Secondary | ICD-10-CM | POA: Diagnosis not present

## 2020-07-05 DIAGNOSIS — E785 Hyperlipidemia, unspecified: Secondary | ICD-10-CM

## 2020-07-05 DIAGNOSIS — G4733 Obstructive sleep apnea (adult) (pediatric): Secondary | ICD-10-CM

## 2020-07-05 DIAGNOSIS — M25562 Pain in left knee: Secondary | ICD-10-CM | POA: Diagnosis not present

## 2020-07-05 DIAGNOSIS — Z8616 Personal history of COVID-19: Secondary | ICD-10-CM

## 2020-07-05 NOTE — Patient Instructions (Signed)
Journal for Nurse Practitioners, 15(4), 263-267. Retrieved January 31, 2018 from [email or web address]">  Knee Exercises Ask your health care provider which exercises are safe for you. Do exercises exactly as told by your health care provider and adjust them as directed. It is normal to feel mild stretching, pulling, tightness, or discomfort as you do these exercises. Stop right away if you feel sudden pain or your pain gets worse. Do not begin these exercises until told by your health care provider. Stretching and range-of-motion exercises These exercises warm up your muscles and joints and improve the movement and flexibility of your knee. These exercises also help to relieve pain and swelling. Knee extension, prone 1. Lie on your abdomen (prone position) on a bed. 2. Place your left / right knee just beyond the edge of the surface so your knee is not on the bed. You can put a towel under your left / right thigh just above your kneecap for comfort. 3. Relax your leg muscles and allow gravity to straighten your knee (extension). You should feel a stretch behind your left / right knee. 4. Hold this position for __________ seconds. 5. Scoot up so your knee is supported between repetitions. Repeat __________ times. Complete this exercise __________ times a day. Knee flexion, active 1. Lie on your back with both legs straight. If this causes back discomfort, bend your left / right knee so your foot is flat on the floor. 2. Slowly slide your left / right heel back toward your buttocks. Stop when you feel a gentle stretch in the front of your knee or thigh (flexion). 3. Hold this position for __________ seconds. 4. Slowly slide your left / right heel back to the starting position. Repeat __________ times. Complete this exercise __________ times a day.   Quadriceps stretch, prone 1. Lie on your abdomen on a firm surface, such as a bed or padded floor. 2. Bend your left / right knee and hold  your ankle. If you cannot reach your ankle or pant leg, loop a belt around your foot and grab the belt instead. 3. Gently pull your heel toward your buttocks. Your knee should not slide out to the side. You should feel a stretch in the front of your thigh and knee (quadriceps). 4. Hold this position for __________ seconds. Repeat __________ times. Complete this exercise __________ times a day.   Hamstring, supine 1. Lie on your back (supine position). 2. Loop a belt or towel over the ball of your left / right foot. The ball of your foot is on the walking surface, right under your toes. 3. Straighten your left / right knee and slowly pull on the belt to raise your leg until you feel a gentle stretch behind your knee (hamstring). ? Do not let your knee bend while you do this. ? Keep your other leg flat on the floor. 4. Hold this position for __________ seconds. Repeat __________ times. Complete this exercise __________ times a day. Strengthening exercises These exercises build strength and endurance in your knee. Endurance is the ability to use your muscles for a long time, even after they get tired. Quadriceps, isometric This exercise stretches the muscles in front of your thigh (quadriceps) without moving your knee joint (isometric). 1. Lie on your back with your left / right leg extended and your other knee bent. Put a rolled towel or small pillow under your knee if told by your health care provider. 2. Slowly tense the muscles in the front of your   left / right thigh. You should see your kneecap slide up toward your hip or see increased dimpling just above the knee. This motion will push the back of the knee toward the floor. 3. For __________ seconds, hold the muscle as tight as you can without increasing your pain. 4. Relax the muscles slowly and completely. Repeat __________ times. Complete this exercise __________ times a day.   Straight leg raises This exercise stretches the muscles in  front of your thigh (quadriceps) and the muscles that move your hips (hip flexors). 1. Lie on your back with your left / right leg extended and your other knee bent. 2. Tense the muscles in the front of your left / right thigh. You should see your kneecap slide up or see increased dimpling just above the knee. Your thigh may even shake a bit. 3. Keep these muscles tight as you raise your leg 4-6 inches (10-15 cm) off the floor. Do not let your knee bend. 4. Hold this position for __________ seconds. 5. Keep these muscles tense as you lower your leg. 6. Relax your muscles slowly and completely after each repetition. Repeat __________ times. Complete this exercise __________ times a day. Hamstring, isometric 1. Lie on your back on a firm surface. 2. Bend your left / right knee about __________ degrees. 3. Dig your left / right heel into the surface as if you are trying to pull it toward your buttocks. Tighten the muscles in the back of your thighs (hamstring) to "dig" as hard as you can without increasing any pain. 4. Hold this position for __________ seconds. 5. Release the tension gradually and allow your muscles to relax completely for __________ seconds after each repetition. Repeat __________ times. Complete this exercise __________ times a day. Hamstring curls If told by your health care provider, do this exercise while wearing ankle weights. Begin with __________ lb weights. Then increase the weight by 1 lb (0.5 kg) increments. Do not wear ankle weights that are more than __________ lb. 1. Lie on your abdomen with your legs straight. 2. Bend your left / right knee as far as you can without feeling pain. Keep your hips flat against the floor. 3. Hold this position for __________ seconds. 4. Slowly lower your leg to the starting position. Repeat __________ times. Complete this exercise __________ times a day.   Squats This exercise strengthens the muscles in front of your thigh and knee  (quadriceps). 1. Stand in front of a table, with your feet and knees pointing straight ahead. You may rest your hands on the table for balance but not for support. 2. Slowly bend your knees and lower your hips like you are going to sit in a chair. ? Keep your weight over your heels, not over your toes. ? Keep your lower legs upright so they are parallel with the table legs. ? Do not let your hips go lower than your knees. ? Do not bend lower than told by your health care provider. ? If your knee pain increases, do not bend as low. 3. Hold the squat position for __________ seconds. 4. Slowly push with your legs to return to standing. Do not use your hands to pull yourself to standing. Repeat __________ times. Complete this exercise __________ times a day. Wall slides This exercise strengthens the muscles in front of your thigh and knee (quadriceps). 1. Lean your back against a smooth wall or door, and walk your feet out 18-24 inches (46-61 cm) from it. 2.   Place your feet hip-width apart. 3. Slowly slide down the wall or door until your knees bend __________ degrees. Keep your knees over your heels, not over your toes. Keep your knees in line with your hips. 4. Hold this position for __________ seconds. Repeat __________ times. Complete this exercise __________ times a day.   Straight leg raises This exercise strengthens the muscles that rotate the leg at the hip and move it away from your body (hip abductors). 1. Lie on your side with your left / right leg in the top position. Lie so your head, shoulder, knee, and hip line up. You may bend your bottom knee to help you keep your balance. 2. Roll your hips slightly forward so your hips are stacked directly over each other and your left / right knee is facing forward. 3. Leading with your heel, lift your top leg 4-6 inches (10-15 cm). You should feel the muscles in your outer hip lifting. ? Do not let your foot drift forward. ? Do not let your  knee roll toward the ceiling. 4. Hold this position for __________ seconds. 5. Slowly return your leg to the starting position. 6. Let your muscles relax completely after each repetition. Repeat __________ times. Complete this exercise __________ times a day.   Straight leg raises This exercise stretches the muscles that move your hips away from the front of the pelvis (hip extensors). 1. Lie on your abdomen on a firm surface. You can put a pillow under your hips if that is more comfortable. 2. Tense the muscles in your buttocks and lift your left / right leg about 4-6 inches (10-15 cm). Keep your knee straight as you lift your leg. 3. Hold this position for __________ seconds. 4. Slowly lower your leg to the starting position. 5. Let your leg relax completely after each repetition. Repeat __________ times. Complete this exercise __________ times a day. This information is not intended to replace advice given to you by your health care provider. Make sure you discuss any questions you have with your health care provider. Document Revised: 02/01/2018 Document Reviewed: 02/01/2018 [employer] Patient Education  2021 [employer] Hand Exercises Hand exercises can be helpful for almost anyone. These exercises can strengthen the hands, improve flexibility and movement, and increase blood flow to the hands. These results can make work and daily tasks easier. Hand exercises can be especially helpful for people who have joint pain from arthritis or have nerve damage from overuse (carpal tunnel syndrome). These exercises can also help people who have injured a hand. Exercises Most of these hand exercises are gentle stretching and motion exercises. It is usually safe to do them often throughout the day. Warming up your hands before exercise may help to reduce stiffness. You can do this with gentle massage or by placing your hands in warm water for 10-15 minutes. It is normal to feel some stretching,  pulling, tightness, or mild discomfort as you begin new exercises. This will gradually improve. Stop an exercise right away if you feel sudden, severe pain or your pain gets worse. Ask your health care provider which exercises are best for you. Knuckle bend or "claw" fist 1. Stand or sit with your arm, hand, and all five fingers pointed straight up. Make sure to keep your wrist straight during the exercise. 2. Gently bend your fingers down toward your palm until the tips of your fingers are touching the top of your palm. Keep your big knuckle straight and just bend the   small knuckles in your fingers. 3. Hold this position for __________ seconds. 4. Straighten (extend) your fingers back to the starting position. Repeat this exercise 5-10 times with each hand. Full finger fist 1. Stand or sit with your arm, hand, and all five fingers pointed straight up. Make sure to keep your wrist straight during the exercise. 2. Gently bend your fingers into your palm until the tips of your fingers are touching the middle of your palm. 3. Hold this position for __________ seconds. 4. Extend your fingers back to the starting position, stretching every joint fully. Repeat this exercise 5-10 times with each hand. Straight fist 1. Stand or sit with your arm, hand, and all five fingers pointed straight up. Make sure to keep your wrist straight during the exercise. 2. Gently bend your fingers at the big knuckle, where your fingers meet your hand, and the middle knuckle. Keep the knuckle at the tips of your fingers straight and try to touch the bottom of your palm. 3. Hold this position for __________ seconds. 4. Extend your fingers back to the starting position, stretching every joint fully. Repeat this exercise 5-10 times with each hand. Tabletop 1. Stand or sit with your arm, hand, and all five fingers pointed straight up. Make sure to keep your wrist straight during the exercise. 2. Gently bend your fingers at the  big knuckle, where your fingers meet your hand, as far down as you can while keeping the small knuckles in your fingers straight. Think of forming a tabletop with your fingers. 3. Hold this position for __________ seconds. 4. Extend your fingers back to the starting position, stretching every joint fully. Repeat this exercise 5-10 times with each hand. Finger spread 1. Place your hand flat on a table with your palm facing down. Make sure your wrist stays straight as you do this exercise. 2. Spread your fingers and thumb apart from each other as far as you can until you feel a gentle stretch. Hold this position for __________ seconds. 3. Bring your fingers and thumb tight together again. Hold this position for __________ seconds. Repeat this exercise 5-10 times with each hand. Making circles 1. Stand or sit with your arm, hand, and all five fingers pointed straight up. Make sure to keep your wrist straight during the exercise. 2. Make a circle by touching the tip of your thumb to the tip of your index finger. 3. Hold for __________ seconds. Then open your hand wide. 4. Repeat this motion with your thumb and each finger on your hand. Repeat this exercise 5-10 times with each hand. Thumb motion 1. Sit with your forearm resting on a table and your wrist straight. Your thumb should be facing up toward the ceiling. Keep your fingers relaxed as you move your thumb. 2. Lift your thumb up as high as you can toward the ceiling. Hold for __________ seconds. 3. Bend your thumb across your palm as far as you can, reaching the tip of your thumb for the small finger (pinkie) side of your palm. Hold for __________ seconds. Repeat this exercise 5-10 times with each hand. Grip strengthening 1. Hold a stress ball or other soft ball in the middle of your hand. 2. Slowly increase the pressure, squeezing the ball as much as you can without causing pain. Think of bringing the tips of your fingers into the middle of  your palm. All of your finger joints should bend when doing this exercise. 3. Hold your squeeze for __________ seconds,   then relax. Repeat this exercise 5-10 times with each hand.   Contact a health care provider if:  Your hand pain or discomfort gets much worse when you do an exercise.  Your hand pain or discomfort does not improve within 2 hours after you exercise. If you have any of these problems, stop doing these exercises right away. Do not do them again unless your health care provider says that you can. Get help right away if:  You develop sudden, severe hand pain or swelling. If this happens, stop doing these exercises right away. Do not do them again unless your health care provider says that you can. This information is not intended to replace advice given to you by your health care provider. Make sure you discuss any questions you have with your health care provider. Document Revised: 08/04/2018 Document Reviewed: 04/14/2018 [employer] Patient Education  2021 [place]. Back Exercises The following exercises strengthen the muscles that help to support the trunk and back. They also help to keep the lower back flexible. Doing these exercises can help to prevent back pain or lessen existing pain.  If you have back pain or discomfort, try doing these exercises 2-3 times each day or as told by your health care provider.  As your pain improves, do them once each day, but increase the number of times that you repeat the steps for each exercise (do more repetitions).  To prevent the recurrence of back pain, continue to do these exercises once each day or as told by your health care provider. Do exercises exactly as told by your health care provider and adjust them as directed. It is normal to feel mild stretching, pulling, tightness, or discomfort as you do these exercises, but you should stop right away if you feel sudden pain or your pain gets worse. Exercises Single knee to  chest Repeat these steps 3-5 times for each leg: 5. Lie on your back on a firm bed or the floor with your legs extended. 6. Bring one knee to your chest. Your other leg should stay extended and in contact with the floor. 7. Hold your knee in place by grabbing your knee or thigh with both hands and hold. 8. Pull on your knee until you feel a gentle stretch in your lower back or buttocks. 9. Hold the stretch for 10-30 seconds. 10. Slowly release and straighten your leg. Pelvic tilt Repeat these steps 5-10 times: 5. Lie on your back on a firm bed or the floor with your legs extended. 6. Bend your knees so they are pointing toward the ceiling and your feet are flat on the floor. 7. Tighten your lower abdominal muscles to press your lower back against the floor. This motion will tilt your pelvis so your tailbone points up toward the ceiling instead of pointing to your feet or the floor. 8. With gentle tension and even breathing, hold this position for 5-10 seconds. Cat-cow Repeat these steps until your lower back becomes more flexible: 5. Get into a hands-and-knees position on a firm surface. Keep your hands under your shoulders, and keep your knees under your hips. You may place padding under your knees for comfort. 6. Let your head hang down toward your chest. Contract your abdominal muscles and point your tailbone toward the floor so your lower back becomes rounded like the back of a cat. 7. Hold this position for 5 seconds. 8. Slowly lift your head, let your abdominal muscles relax and point your tailbone up toward  the ceiling so your back forms a sagging arch like the back of a cow. 9. Hold this position for 5 seconds.   Press-ups Repeat these steps 5-10 times: 5. Lie on your abdomen (face-down) on the floor. 6. Place your palms near your head, about shoulder-width apart. 7. Keeping your back as relaxed as possible and keeping your hips on the floor, slowly straighten your arms to raise the  top half of your body and lift your shoulders. Do not use your back muscles to raise your upper torso. You may adjust the placement of your hands to make yourself more comfortable. 8. Hold this position for 5 seconds while you keep your back relaxed. 9. Slowly return to lying flat on the floor.   Bridges Repeat these steps 10 times: 7. Lie on your back on a firm surface. 8. Bend your knees so they are pointing toward the ceiling and your feet are flat on the floor. Your arms should be flat at your sides, next to your body. 9. Tighten your buttocks muscles and lift your buttocks off the floor until your waist is at almost the same height as your knees. You should feel the muscles working in your buttocks and the back of your thighs. If you do not feel these muscles, slide your feet 1-2 inches farther away from your buttocks. 10. Hold this position for 3-5 seconds. 11. Slowly lower your hips to the starting position, and allow your buttocks muscles to relax completely. If this exercise is too easy, try doing it with your arms crossed over your chest.   Abdominal crunches Repeat these steps 5-10 times: 6. Lie on your back on a firm bed or the floor with your legs extended. 7. Bend your knees so they are pointing toward the ceiling and your feet are flat on the floor. 8. Cross your arms over your chest. 9. Tip your chin slightly toward your chest without bending your neck. 10. Tighten your abdominal muscles and slowly raise your trunk (torso) high enough to lift your shoulder blades a tiny bit off the floor. Avoid raising your torso higher than that because it can put too much stress on your low back and does not help to strengthen your abdominal muscles. 11. Slowly return to your starting position. Back lifts Repeat these steps 5-10 times: 5. Lie on your abdomen (face-down) with your arms at your sides, and rest your forehead on the floor. 6. Tighten the muscles in your legs and your  buttocks. 7. Slowly lift your chest off the floor while you keep your hips pressed to the floor. Keep the back of your head in line with the curve in your back. Your eyes should be looking at the floor. 8. Hold this position for 3-5 seconds. 9. Slowly return to your starting position. Contact a health care provider if:  Your back pain or discomfort gets much worse when you do an exercise.  Your worsening back pain or discomfort does not lessen within 2 hours after you exercise. If you have any of these problems, stop doing these exercises right away. Do not do them again unless your health care provider says that you can. Get help right away if:  You develop sudden, severe back pain. If this happens, stop doing the exercises right away. Do not do them again unless your health care provider says that you can. This information is not intended to replace advice given to you by your health care provider. Make sure you discuss  any questions you have with your health care provider. Document Revised: 08/18/2018 Document Reviewed: 01/13/2018 Else[employer] Patient Education  [place].

## 2020-07-08 ENCOUNTER — Ambulatory Visit (HOSPITAL_COMMUNITY)
Admission: RE | Admit: 2020-07-08 | Discharge: 2020-07-08 | Disposition: A | Discharge status: Home / Self Care | Payer: BC Managed Care – PPO | Source: Ambulatory Visit | Attending: Family Medicine | Admitting: Family Medicine

## 2020-07-08 ENCOUNTER — Other Ambulatory Visit: Payer: Self-pay

## 2020-07-08 DIAGNOSIS — S0990XA Unspecified injury of head, initial encounter: Secondary | ICD-10-CM | POA: Diagnosis not present

## 2020-07-08 DIAGNOSIS — G4452 New daily persistent headache (NDPH): Secondary | ICD-10-CM

## 2020-07-08 DIAGNOSIS — S0990XS Unspecified injury of head, sequela: Secondary | ICD-10-CM | POA: Diagnosis not present

## 2020-07-19 NOTE — Progress Notes (Signed)
Office Visit Note  Patient: Pamela Walters             Date of Birth: 1961-03-14           MRN: [id number]             PCP: Pamela Helper, MD Referring: Pamela Helper, MD Visit Date: 07/23/2020 Occupation: @GUAROCC @  Subjective:  Pain in both knees.   History of Present Illness: Pamela Walters is a 60 y.o. female with history of osteoarthritis.  She states she continues to have a lot of pain and discomfort in her knee joints.  Although she is not ready to have total knee replacement.  She has some stiffness in her hands.  She also complains of left lateral epicondyle pain.  She denies any history of joint swelling.  Activities of Daily Living:  Patient reports morning stiffness for 30-60 minutes.   Patient Reports nocturnal pain.  Difficulty dressing/grooming: Denies Difficulty climbing stairs: Reports Difficulty getting out of chair: Reports Difficulty using hands for taps, buttons, cutlery, and/or writing: Reports  Review of Systems  Constitutional: Positive for fatigue.  HENT: Positive for mouth dryness. Negative for mouth sores and nose dryness.   Eyes: Positive for dryness. Negative for pain and itching.  Respiratory: Negative for shortness of breath and difficulty breathing.   Cardiovascular: Negative for chest pain and palpitations.  Gastrointestinal: Negative for blood in stool, constipation and diarrhea.  Endocrine: Negative for increased urination.  Genitourinary: Negative for difficulty urinating.  Musculoskeletal: Positive for arthralgias, joint pain, myalgias, morning stiffness, muscle tenderness and myalgias. Negative for joint swelling.  Skin: Negative for color change, rash and redness.  Allergic/Immunologic: Negative for susceptible to infections.  Neurological: Positive for headaches and weakness. Negative for dizziness, numbness and memory loss.  Hematological: Negative for bruising/bleeding tendency.  Psychiatric/Behavioral: Negative for  confusion.    PMFS History:  Patient Active Problem List   Diagnosis Date Noted  . New daily persistent headache (ndph) 06/16/2020  . Head trauma, sequela 06/16/2020  . Head trauma 06/10/2020  . Hemorrhoids 06/05/2020  . S/P hysterectomy 06/05/2020  . Other headache syndrome 09/13/2019  . Fidgeting 09/13/2019  . Dysphagia 06/24/2019  . History of 2019 novel coronavirus disease (COVID-19) 06/20/2019  . Knee pain 01/30/2019  . Recurrent cold sores 03/11/2018  . Dyspepsia 03/07/2018  . History of multiple sclerosis ([place]) 02/11/2018  . Neck pain 02/11/2018  . History of Bell's palsy 02/11/2018  . Abnormal brain MRI 05/19/2017  . Carpal tunnel syndrome, bilateral 05/19/2017  . Abnormal stress test   . Allergic rhinitis 06/20/2015  . Dysesthesia 03/04/2015  . OSA (obstructive sleep apnea) 10/01/2014  . Increased homocysteine 10/01/2014  . Urinary incontinence 05/30/2014  . Back pain with radiation 02/05/2013  . DISORDER OF BONE AND CARTILAGE UNSPECIFIED 12/16/2009  . Overweight (BMI 25.0-29.9) 04/08/2008  . Depression with anxiety 12/18/2007  . Headache 12/18/2007  . Hyperlipidemia LDL goal <100 12/16/2007  . Essential hypertension 12/16/2007    Past Medical History:  Diagnosis Date  . Allergy   . Anemia   . Anxiety   . Arthritis   . Back pain   . Bell's palsy 08/2017  . Depression   . Depression    Phreesia 05/19/2020  . GERD (gastroesophageal reflux disease)   . Hepatic steatosis   . Hiatal hernia   . History of Bell's palsy 02/11/2018  . Hyperlipidemia   . Hypertension   . Internal hemorrhoids   . MS (multiple sclerosis) ([place])  2007  . Multiple sclerosis ([place])   . Multiple sclerosis ([place]) 04/04/2008   Qualifier: Diagnosis of  By: Moshe Cipro MD, Joycelyn Schmid    . Schatzki's ring   . Sleep apnea    wears CPAP    Family History  Problem Relation Age of Onset  . Hypertension Mother   . Hyperlipidemia Mother   . Depression Mother   . Hypertension Father   .  Hypertension Sister   . High Cholesterol Sister   . Healthy Son   . Healthy Daughter   . Colon cancer Neg Hx   . Esophageal cancer Neg Hx   . Rectal cancer Neg Hx   . Stomach cancer Neg Hx   . Colon polyps Neg Hx    Past Surgical History:  Procedure Laterality Date  . ABDOMINAL HYSTERECTOMY  2005   fibroids  . CARDIAC CATHETERIZATION N/A 05/04/2016   Procedure: Left Heart Cath and Coronary Angiography;  Surgeon: Jettie Booze, MD;  Location: [facility];  Service: Cardiovascular;  Laterality: N/A;  . COLONOSCOPY    . ESOPHAGOGASTRODUODENOSCOPY N/A 09/18/2013   Procedure: ESOPHAGOGASTRODUODENOSCOPY (EGD);  Surgeon: Jerene Bears, MD;  Location: [facility];  Service: Gastroenterology;  Laterality: N/A;  . UPPER GASTROINTESTINAL ENDOSCOPY    . WISDOM TOOTH EXTRACTION     Social History   Social History Narrative  . Not on file   Immunization History  Administered Date(s) Administered  . Influenza Split 01/26/2014  . Influenza,inj,Quad PF,6+ Mos 02/06/2020  . PFIZER(Purple Top)SARS-COV-2 Vaccination 08/12/2019, 09/05/2019, 06/05/2020  . Tdap 03/17/2013  . Zoster Recombinat (Shingrix) 10/20/2018, 01/30/2019     Objective: Vital Signs: BP 126/79 (BP Location: Left Arm, Patient Position: Sitting, Cuff Size: Normal)   Pulse 75   Resp 15   Ht 5\' 3"  (1.6 m)   Wt 165 lb (74.8 kg)   BMI 29.23 kg/m    Physical Exam Vitals and nursing note reviewed.  Constitutional:      Appearance: She is well-developed.  HENT:     Head: Normocephalic and atraumatic.  Eyes:     Conjunctiva/sclera: Conjunctivae normal.  Cardiovascular:     Rate and Rhythm: Normal rate and regular rhythm.     Heart sounds: Normal heart sounds.  Pulmonary:     Effort: Pulmonary effort is normal.     Breath sounds: Normal breath sounds.  Abdominal:     General: Bowel sounds are normal.     Palpations: Abdomen is soft.  Musculoskeletal:     Cervical back: Normal range of motion.   Lymphadenopathy:     Cervical: No cervical adenopathy.  Skin:    General: Skin is warm and dry.     Capillary Refill: Capillary refill takes less than 2 seconds.  Neurological:     Mental Status: She is alert and oriented to person, place, and time.  Psychiatric:        Behavior: Behavior normal.      Musculoskeletal Exam: C-spine was in good range of motion.  She had no discomfort in thoracic or lumbar spine.  Shoulder joints, elbow joints, wrist joints with good range of motion.  She had bilateral PIP and DIP thickening.  She had tenderness over left lateral epicondyle.  Hip joints in good range of motion.  Knee joints were in good range of motion with discomfort.  Crepitus was noted in bilateral knee joints without any warmth swelling or effusion.  There was no tenderness over ankles or MTPs.  CDAI Exam: CDAI Score: -  Patient Global: -; Provider Global: - Swollen: -; Tender: - Joint Exam 07/23/2020   No joint exam has been documented for this visit   There is currently no information documented on the homunculus. Go to the Rheumatology activity and complete the homunculus joint exam.  Investigation: No additional findings.  Imaging: MR Brain Wo Contrast  Result Date: 07/08/2020 CLINICAL DATA:  Severe headache.  Trauma 6 weeks ago.w EXAM: MRI HEAD WITHOUT CONTRAST TECHNIQUE: Multiplanar, multiecho pulse sequences of the brain and surrounding structures were obtained without intravenous contrast. COMPARISON:  MRI head March 27, 2015. FINDINGS: Brain: No acute infarction, hemorrhage, hydrocephalus, or mass lesion. In comparison to March 27, 2015, similar versus slightly progressed T2/FLAIR hyperintensities within the periventricular and juxtacortical supratentorial white matter. No susceptibility artifact to suggest prior hemorrhage. No restricted diffusion. No extra-axial fluid collections. Partially empty sella. Vascular: Major arterial flow voids are maintained at the skull  base. Skull and upper cervical spine: Normal marrow signal. Sinuses/Orbits: Minimal mucosal thickening without air-fluid levels. Unremarkable orbits. Other: No mastoid effusions. IMPRESSION: 1. In comparison to March 27, 2015, similar versus slightly progressed T2/FLAIR white matter lesions which are consistent with the patient's diagnosis of multiple sclerosis (as documented in EPIC). The absence of contrast precludes evaluation for active demyelination. 2. Otherwise, no acute intracranial abnormality. 3. Partially empty sella. This finding is nonspecific and may be incidental, but can be seen with idiopathic intracranial hypertension in the correct clinical setting. Electronically Signed   By: Margaretha Sheffield MD   On: 07/08/2020 18:12   XR Hand 2 View Left  Result Date: 07/05/2020 CMC narrowing and subluxation was noted.  PIP and DIP narrowing was noted.  No MCP, intercarpal radiocarpal joint space narrowing was noted.  No erosive changes were noted. Impression: These findings are consistent with osteoarthritis of the hand.  XR Hand 2 View Right  Result Date: 07/05/2020 CMC, PIP and DIP narrowing was noted.  No MCP, intercarpal or radiocarpal joint space narrowing was noted.  No erosive changes were noted. Impression: These findings are consistent with osteoarthritis of the hand.  XR KNEE 3 VIEW LEFT  Result Date: 07/05/2020 Severe medial compartment narrowing with medial and intercondylar osteophytes was noted.  Severe patellofemoral narrowing was noted.  No chondrocalcinosis was noted. Impression: These findings are consistent with severe osteoarthritis and severe chondromalacia patella.  XR KNEE 3 VIEW RIGHT  Result Date: 07/05/2020 Right severe medial compartment narrowing with medial and intercondylar osteophytes was noted.  Severe patellofemoral narrowing was noted.  No chondrocalcinosis was noted. Impression: These findings are consistent with severe osteoarthritis and severe  chondromalacia patella.  XR Lumbar Spine 2-3 Views  Result Date: 07/05/2020 Multilevel spondylosis was noted.  L4-L5 spondylolisthesis was noted.  Severe facet joint arthropathy was noted. Impression: These findings are consistent with multilevel spondylosis, L4-L5 spondylolisthesis and facet joint arthropathy.   Recent Labs: Lab Results  Component Value Date   WBC 6.9 02/06/2020   HGB 13.1 02/06/2020   PLT 363 02/06/2020   NA 141 06/05/2020   K 3.8 06/05/2020   CL 96 06/05/2020   CO2 28 06/05/2020   GLUCOSE 81 06/05/2020   BUN 13 06/05/2020   CREATININE 1.06 (H) 06/05/2020   BILITOT 0.3 06/05/2020   ALKPHOS 111 06/05/2020   AST 22 06/05/2020   ALT 12 06/05/2020   PROT 7.3 06/05/2020   ALBUMIN 4.8 06/05/2020   CALCIUM 9.8 06/05/2020   GFRAA 65 06/05/2020    Speciality Comments: No specialty comments available.  Procedures:  Large Joint Inj: bilateral knee on 07/23/2020 2:25 PM Indications: pain Details: 27 G 1.5 in needle, medial approach  Arthrogram: No  Medications (Right): 1.5 mL lidocaine 1 %; 40 mg triamcinolone acetonide 40 MG/ML Aspirate (Right): 0 mL Medications (Left): 1.5 mL lidocaine 1 %; 40 mg triamcinolone acetonide 40 MG/ML Aspirate (Left): 0 mL Outcome: tolerated well, no immediate complications Procedure, treatment alternatives, risks and benefits explained, specific risks discussed. Consent was given by the patient. Immediately prior to procedure a time out was called to verify the correct patient, procedure, equipment, support staff and site/side marked as required. Patient was prepped and draped in the usual sterile fashion.     Allergies: Patient has no known allergies.   Assessment / Plan:     Visit Diagnoses: Primary osteoarthritis of both hands - Clinical and radiographic findings were consistent with bilateral severe osteoarthritis.  Joint protection muscle strengthening was discussed.  Carpal tunnel syndrome, bilateral - She uses bilateral  carpal tunnel syndrome braces at night.  She does not want to proceed with nerve conduction velocities.  She states her symptoms are mild.  Lateral epicondylitis, left elbow - A handout on exercises was given.  We will see response to the exercises.  Primary osteoarthritis of both knees - bilateral severe osteoarthritis and severe chondromalacia patella.  Detailed counseling osteoarthritis was provided.  Joint protection muscle strengthening was discussed.  She is not ready for total knee replacement yet.  She has tried over-the-counter medications without much help.  She requested cortisone injection to bilateral knee joints.  Side effects of cortisone injections were discussed.  Informed consent was obtained and per patient's request bilateral knee joints were injected with cortisone as described above.  She tolerated the procedure well.  Postprocedure instructions were given.  A handout on knee joint muscle strengthening exercises was given.  Use of recumbent bike was discussed.  Maintaining good weight was discussed.  DDD (degenerative disc disease), lumbar - Multilevel spondylosis and L4-L5 spondylolisthesis with facet joint arthropathy.  She is followed by Dr. Rolena Infante and gets injections by Dr. Herma Mering.  Essential hypertension-blood pressure was within normal limits today.  Hyperlipidemia LDL goal <100-benefit of daily exercise was discussed.  Pharyngoesophageal dysphagia  History of Bell's palsy  Increased homocysteine  Depression with anxiety  OSA (obstructive sleep apnea)  History of 2019 novel coronavirus disease (COVID-19) - January 2021  Postmenopausal  Osteoporosis screening-patient states she has never had a DEXA scan.  I will schedule DEXA scan.  Use of calcium, vitamin D and resistive exercises was emphasized.  Orders: Orders Placed This Encounter  Procedures  . Large Joint Inj  . DG BONE DENSITY (DXA)   No orders of the defined types were placed in this  encounter.    Follow-Up Instructions: Return in about 3 months (around 10/23/2020) for Osteoarthritis.   Bo Merino, MD  Note - This record has been created using Editor, commissioning.  Chart creation errors have been sought, but may not always  have been located. Such creation errors do not reflect on  the standard of medical care.

## 2020-07-23 ENCOUNTER — Encounter: Payer: Self-pay | Admitting: Rheumatology

## 2020-07-23 ENCOUNTER — Other Ambulatory Visit: Payer: Self-pay

## 2020-07-23 ENCOUNTER — Ambulatory Visit: Payer: BC Managed Care – PPO | Admitting: Rheumatology

## 2020-07-23 VITALS — BP 126/79 | HR 75 | Resp 15 | Ht 63.0 in | Wt 165.0 lb

## 2020-07-23 DIAGNOSIS — M19041 Primary osteoarthritis, right hand: Secondary | ICD-10-CM | POA: Diagnosis not present

## 2020-07-23 DIAGNOSIS — M170 Bilateral primary osteoarthritis of knee: Secondary | ICD-10-CM

## 2020-07-23 DIAGNOSIS — Z13820 Encounter for screening for osteoporosis: Secondary | ICD-10-CM

## 2020-07-23 DIAGNOSIS — R1314 Dysphagia, pharyngoesophageal phase: Secondary | ICD-10-CM

## 2020-07-23 DIAGNOSIS — Z8669 Personal history of other diseases of the nervous system and sense organs: Secondary | ICD-10-CM

## 2020-07-23 DIAGNOSIS — M5136 Other intervertebral disc degeneration, lumbar region: Secondary | ICD-10-CM

## 2020-07-23 DIAGNOSIS — Z8616 Personal history of COVID-19: Secondary | ICD-10-CM

## 2020-07-23 DIAGNOSIS — Z780 Asymptomatic menopausal state: Secondary | ICD-10-CM

## 2020-07-23 DIAGNOSIS — E785 Hyperlipidemia, unspecified: Secondary | ICD-10-CM

## 2020-07-23 DIAGNOSIS — M7712 Lateral epicondylitis, left elbow: Secondary | ICD-10-CM | POA: Diagnosis not present

## 2020-07-23 DIAGNOSIS — I10 Essential (primary) hypertension: Secondary | ICD-10-CM

## 2020-07-23 DIAGNOSIS — G5603 Carpal tunnel syndrome, bilateral upper limbs: Secondary | ICD-10-CM | POA: Diagnosis not present

## 2020-07-23 DIAGNOSIS — F418 Other specified anxiety disorders: Secondary | ICD-10-CM

## 2020-07-23 DIAGNOSIS — M19042 Primary osteoarthritis, left hand: Secondary | ICD-10-CM

## 2020-07-23 DIAGNOSIS — R7989 Other specified abnormal findings of blood chemistry: Secondary | ICD-10-CM

## 2020-07-23 DIAGNOSIS — G4733 Obstructive sleep apnea (adult) (pediatric): Secondary | ICD-10-CM

## 2020-07-23 MED ORDER — MODAFINIL 200 MG PO TABS: 200.0000 mg | ORAL_TABLET | Freq: Every day | ORAL | 0 refills | Status: DC

## 2020-07-23 NOTE — Patient Instructions (Signed)
Journal for Nurse Practitioners, 15(4), 263-267. Retrieved January 31, 2018 from [email or web address]">  Knee Exercises Ask your health care provider which exercises are safe for you. Do exercises exactly as told by your health care provider and adjust them as directed. It is normal to feel mild stretching, pulling, tightness, or discomfort as you do these exercises. Stop right away if you feel sudden pain or your pain gets worse. Do not begin these exercises until told by your health care provider. Stretching and range-of-motion exercises These exercises warm up your muscles and joints and improve the movement and flexibility of your knee. These exercises also help to relieve pain and swelling. Knee extension, prone 1. Lie on your abdomen (prone position) on a bed. 2. Place your left / right knee just beyond the edge of the surface so your knee is not on the bed. You can put a towel under your left / right thigh just above your kneecap for comfort. 3. Relax your leg muscles and allow gravity to straighten your knee (extension). You should feel a stretch behind your left / right knee. 4. Hold this position for __________ seconds. 5. Scoot up so your knee is supported between repetitions. Repeat __________ times. Complete this exercise __________ times a day. Knee flexion, active 1. Lie on your back with both legs straight. If this causes back discomfort, bend your left / right knee so your foot is flat on the floor. 2. Slowly slide your left / right heel back toward your buttocks. Stop when you feel a gentle stretch in the front of your knee or thigh (flexion). 3. Hold this position for __________ seconds. 4. Slowly slide your left / right heel back to the starting position. Repeat __________ times. Complete this exercise __________ times a day.   Quadriceps stretch, prone 1. Lie on your abdomen on a firm surface, such as a bed or padded floor. 2. Bend your left / right knee and hold  your ankle. If you cannot reach your ankle or pant leg, loop a belt around your foot and grab the belt instead. 3. Gently pull your heel toward your buttocks. Your knee should not slide out to the side. You should feel a stretch in the front of your thigh and knee (quadriceps). 4. Hold this position for __________ seconds. Repeat __________ times. Complete this exercise __________ times a day.   Hamstring, supine 1. Lie on your back (supine position). 2. Loop a belt or towel over the ball of your left / right foot. The ball of your foot is on the walking surface, right under your toes. 3. Straighten your left / right knee and slowly pull on the belt to raise your leg until you feel a gentle stretch behind your knee (hamstring). ? Do not let your knee bend while you do this. ? Keep your other leg flat on the floor. 4. Hold this position for __________ seconds. Repeat __________ times. Complete this exercise __________ times a day. Strengthening exercises These exercises build strength and endurance in your knee. Endurance is the ability to use your muscles for a long time, even after they get tired. Quadriceps, isometric This exercise stretches the muscles in front of your thigh (quadriceps) without moving your knee joint (isometric). 1. Lie on your back with your left / right leg extended and your other knee bent. Put a rolled towel or small pillow under your knee if told by your health care provider. 2. Slowly tense the muscles in the front of your   left / right thigh. You should see your kneecap slide up toward your hip or see increased dimpling just above the knee. This motion will push the back of the knee toward the floor. 3. For __________ seconds, hold the muscle as tight as you can without increasing your pain. 4. Relax the muscles slowly and completely. Repeat __________ times. Complete this exercise __________ times a day.   Straight leg raises This exercise stretches the muscles in  front of your thigh (quadriceps) and the muscles that move your hips (hip flexors). 1. Lie on your back with your left / right leg extended and your other knee bent. 2. Tense the muscles in the front of your left / right thigh. You should see your kneecap slide up or see increased dimpling just above the knee. Your thigh may even shake a bit. 3. Keep these muscles tight as you raise your leg 4-6 inches (10-15 cm) off the floor. Do not let your knee bend. 4. Hold this position for __________ seconds. 5. Keep these muscles tense as you lower your leg. 6. Relax your muscles slowly and completely after each repetition. Repeat __________ times. Complete this exercise __________ times a day. Hamstring, isometric 1. Lie on your back on a firm surface. 2. Bend your left / right knee about __________ degrees. 3. Dig your left / right heel into the surface as if you are trying to pull it toward your buttocks. Tighten the muscles in the back of your thighs (hamstring) to "dig" as hard as you can without increasing any pain. 4. Hold this position for __________ seconds. 5. Release the tension gradually and allow your muscles to relax completely for __________ seconds after each repetition. Repeat __________ times. Complete this exercise __________ times a day. Hamstring curls If told by your health care provider, do this exercise while wearing ankle weights. Begin with __________ lb weights. Then increase the weight by 1 lb (0.5 kg) increments. Do not wear ankle weights that are more than __________ lb. 1. Lie on your abdomen with your legs straight. 2. Bend your left / right knee as far as you can without feeling pain. Keep your hips flat against the floor. 3. Hold this position for __________ seconds. 4. Slowly lower your leg to the starting position. Repeat __________ times. Complete this exercise __________ times a day.   Squats This exercise strengthens the muscles in front of your thigh and knee  (quadriceps). 1. Stand in front of a table, with your feet and knees pointing straight ahead. You may rest your hands on the table for balance but not for support. 2. Slowly bend your knees and lower your hips like you are going to sit in a chair. ? Keep your weight over your heels, not over your toes. ? Keep your lower legs upright so they are parallel with the table legs. ? Do not let your hips go lower than your knees. ? Do not bend lower than told by your health care provider. ? If your knee pain increases, do not bend as low. 3. Hold the squat position for __________ seconds. 4. Slowly push with your legs to return to standing. Do not use your hands to pull yourself to standing. Repeat __________ times. Complete this exercise __________ times a day. Wall slides This exercise strengthens the muscles in front of your thigh and knee (quadriceps). 1. Lean your back against a smooth wall or door, and walk your feet out 18-24 inches (46-61 cm) from it. 2.   Place your feet hip-width apart. 3. Slowly slide down the wall or door until your knees bend __________ degrees. Keep your knees over your heels, not over your toes. Keep your knees in line with your hips. 4. Hold this position for __________ seconds. Repeat __________ times. Complete this exercise __________ times a day.   Straight leg raises This exercise strengthens the muscles that rotate the leg at the hip and move it away from your body (hip abductors). 1. Lie on your side with your left / right leg in the top position. Lie so your head, shoulder, knee, and hip line up. You may bend your bottom knee to help you keep your balance. 2. Roll your hips slightly forward so your hips are stacked directly over each other and your left / right knee is facing forward. 3. Leading with your heel, lift your top leg 4-6 inches (10-15 cm). You should feel the muscles in your outer hip lifting. ? Do not let your foot drift forward. ? Do not let your  knee roll toward the ceiling. 4. Hold this position for __________ seconds. 5. Slowly return your leg to the starting position. 6. Let your muscles relax completely after each repetition. Repeat __________ times. Complete this exercise __________ times a day.   Straight leg raises This exercise stretches the muscles that move your hips away from the front of the pelvis (hip extensors). 1. Lie on your abdomen on a firm surface. You can put a pillow under your hips if that is more comfortable. 2. Tense the muscles in your buttocks and lift your left / right leg about 4-6 inches (10-15 cm). Keep your knee straight as you lift your leg. 3. Hold this position for __________ seconds. 4. Slowly lower your leg to the starting position. 5. Let your leg relax completely after each repetition. Repeat __________ times. Complete this exercise __________ times a day. This information is not intended to replace advice given to you by your health care provider. Make sure you discuss any questions you have with your health care provider. Document Revised: 02/01/2018 Document Reviewed: 02/01/2018 [employer] Patient Education  2021 [employer] Hand Exercises Hand exercises can be helpful for almost anyone. These exercises can strengthen the hands, improve flexibility and movement, and increase blood flow to the hands. These results can make work and daily tasks easier. Hand exercises can be especially helpful for people who have joint pain from arthritis or have nerve damage from overuse (carpal tunnel syndrome). These exercises can also help people who have injured a hand. Exercises Most of these hand exercises are gentle stretching and motion exercises. It is usually safe to do them often throughout the day. Warming up your hands before exercise may help to reduce stiffness. You can do this with gentle massage or by placing your hands in warm water for 10-15 minutes. It is normal to feel some stretching,  pulling, tightness, or mild discomfort as you begin new exercises. This will gradually improve. Stop an exercise right away if you feel sudden, severe pain or your pain gets worse. Ask your health care provider which exercises are best for you. Knuckle bend or "claw" fist 1. Stand or sit with your arm, hand, and all five fingers pointed straight up. Make sure to keep your wrist straight during the exercise. 2. Gently bend your fingers down toward your palm until the tips of your fingers are touching the top of your palm. Keep your big knuckle straight and just bend the   small knuckles in your fingers. 3. Hold this position for __________ seconds. 4. Straighten (extend) your fingers back to the starting position. Repeat this exercise 5-10 times with each hand. Full finger fist 1. Stand or sit with your arm, hand, and all five fingers pointed straight up. Make sure to keep your wrist straight during the exercise. 2. Gently bend your fingers into your palm until the tips of your fingers are touching the middle of your palm. 3. Hold this position for __________ seconds. 4. Extend your fingers back to the starting position, stretching every joint fully. Repeat this exercise 5-10 times with each hand. Straight fist 1. Stand or sit with your arm, hand, and all five fingers pointed straight up. Make sure to keep your wrist straight during the exercise. 2. Gently bend your fingers at the big knuckle, where your fingers meet your hand, and the middle knuckle. Keep the knuckle at the tips of your fingers straight and try to touch the bottom of your palm. 3. Hold this position for __________ seconds. 4. Extend your fingers back to the starting position, stretching every joint fully. Repeat this exercise 5-10 times with each hand. Tabletop 1. Stand or sit with your arm, hand, and all five fingers pointed straight up. Make sure to keep your wrist straight during the exercise. 2. Gently bend your fingers at the  big knuckle, where your fingers meet your hand, as far down as you can while keeping the small knuckles in your fingers straight. Think of forming a tabletop with your fingers. 3. Hold this position for __________ seconds. 4. Extend your fingers back to the starting position, stretching every joint fully. Repeat this exercise 5-10 times with each hand. Finger spread 1. Place your hand flat on a table with your palm facing down. Make sure your wrist stays straight as you do this exercise. 2. Spread your fingers and thumb apart from each other as far as you can until you feel a gentle stretch. Hold this position for __________ seconds. 3. Bring your fingers and thumb tight together again. Hold this position for __________ seconds. Repeat this exercise 5-10 times with each hand. Making circles 1. Stand or sit with your arm, hand, and all five fingers pointed straight up. Make sure to keep your wrist straight during the exercise. 2. Make a circle by touching the tip of your thumb to the tip of your index finger. 3. Hold for __________ seconds. Then open your hand wide. 4. Repeat this motion with your thumb and each finger on your hand. Repeat this exercise 5-10 times with each hand. Thumb motion 1. Sit with your forearm resting on a table and your wrist straight. Your thumb should be facing up toward the ceiling. Keep your fingers relaxed as you move your thumb. 2. Lift your thumb up as high as you can toward the ceiling. Hold for __________ seconds. 3. Bend your thumb across your palm as far as you can, reaching the tip of your thumb for the small finger (pinkie) side of your palm. Hold for __________ seconds. Repeat this exercise 5-10 times with each hand. Grip strengthening 1. Hold a stress ball or other soft ball in the middle of your hand. 2. Slowly increase the pressure, squeezing the ball as much as you can without causing pain. Think of bringing the tips of your fingers into the middle of  your palm. All of your finger joints should bend when doing this exercise. 3. Hold your squeeze for __________ seconds,   then relax. Repeat this exercise 5-10 times with each hand.   Contact a health care provider if:  Your hand pain or discomfort gets much worse when you do an exercise.  Your hand pain or discomfort does not improve within 2 hours after you exercise. If you have any of these problems, stop doing these exercises right away. Do not do them again unless your health care provider says that you can. Get help right away if:  You develop sudden, severe hand pain or swelling. If this happens, stop doing these exercises right away. Do not do them again unless your health care provider says that you can. This information is not intended to replace advice given to you by your health care provider. Make sure you discuss any questions you have with your health care provider. Document Revised: 08/04/2018 Document Reviewed: 04/14/2018 [employer] Patient Education  2021 [employer]  

## 2020-07-25 ENCOUNTER — Institutional Professional Consult (permissible substitution): Payer: BC Managed Care – PPO | Admitting: Pulmonary Disease

## 2020-07-25 ENCOUNTER — Ambulatory Visit (HOSPITAL_COMMUNITY): Payer: BC Managed Care – PPO

## 2020-07-26 ENCOUNTER — Ambulatory Visit: Payer: BC Managed Care – PPO | Admitting: Rheumatology

## 2020-08-02 ENCOUNTER — Encounter: Payer: Self-pay | Admitting: *Deleted

## 2020-08-07 ENCOUNTER — Other Ambulatory Visit (HOSPITAL_COMMUNITY): Payer: Self-pay | Admitting: Family Medicine

## 2020-08-07 DIAGNOSIS — Z1231 Encounter for screening mammogram for malignant neoplasm of breast: Secondary | ICD-10-CM

## 2020-08-08 ENCOUNTER — Other Ambulatory Visit (HOSPITAL_COMMUNITY): Payer: BC Managed Care – PPO

## 2020-08-12 ENCOUNTER — Other Ambulatory Visit (HOSPITAL_COMMUNITY): Payer: BC Managed Care – PPO

## 2020-08-12 ENCOUNTER — Ambulatory Visit (HOSPITAL_COMMUNITY): Payer: BC Managed Care – PPO

## 2020-08-27 ENCOUNTER — Ambulatory Visit (HOSPITAL_COMMUNITY)
Admission: RE | Admit: 2020-08-27 | Discharge: 2020-08-27 | Disposition: A | Discharge status: Home / Self Care | Payer: BC Managed Care – PPO | Source: Ambulatory Visit | Attending: Rheumatology | Admitting: Rheumatology

## 2020-08-27 ENCOUNTER — Other Ambulatory Visit: Payer: Self-pay

## 2020-08-27 ENCOUNTER — Encounter: Payer: Self-pay | Admitting: *Deleted

## 2020-08-27 DIAGNOSIS — M8589 Other specified disorders of bone density and structure, multiple sites: Secondary | ICD-10-CM | POA: Diagnosis not present

## 2020-08-27 DIAGNOSIS — Z780 Asymptomatic menopausal state: Secondary | ICD-10-CM | POA: Diagnosis not present

## 2020-08-27 DIAGNOSIS — Z13820 Encounter for screening for osteoporosis: Secondary | ICD-10-CM | POA: Diagnosis not present

## 2020-08-27 NOTE — Progress Notes (Signed)
DEXA scan is consistent with osteopenia.  Recommendations are calcium rich diet with vitamin D.  Total intake of calcium should be 1200 mg a day.  Resistive exercises including walking encouraged.  We will repeat bone density in 2 to 3 years.

## 2020-08-30 ENCOUNTER — Inpatient Hospital Stay (HOSPITAL_COMMUNITY): Admission: RE | Admit: 2020-08-30 | Payer: BC Managed Care – PPO | Source: Ambulatory Visit

## 2020-09-17 ENCOUNTER — Other Ambulatory Visit (HOSPITAL_COMMUNITY): Payer: Self-pay | Admitting: Psychiatry

## 2020-09-17 NOTE — Telephone Encounter (Signed)
Call for appt

## 2020-09-20 ENCOUNTER — Encounter (HOSPITAL_COMMUNITY): Payer: Self-pay | Admitting: Psychiatry

## 2020-09-20 ENCOUNTER — Telehealth (INDEPENDENT_AMBULATORY_CARE_PROVIDER_SITE_OTHER): Payer: BC Managed Care – PPO | Admitting: Psychiatry

## 2020-09-20 ENCOUNTER — Other Ambulatory Visit: Payer: Self-pay

## 2020-09-20 DIAGNOSIS — F331 Major depressive disorder, recurrent, moderate: Secondary | ICD-10-CM

## 2020-09-20 MED ORDER — BUPROPION HCL ER (XL) 150 MG PO TB24: 1.0000 | ORAL_TABLET | Freq: Every morning | ORAL | 3 refills | Status: DC

## 2020-09-20 MED ORDER — CLONAZEPAM 0.5 MG PO TABS: 0.5000 mg | ORAL_TABLET | Freq: Every day | ORAL | 2 refills | Status: DC | PRN

## 2020-09-20 MED ORDER — PRAZOSIN HCL 1 MG PO CAPS: 1.0000 mg | ORAL_CAPSULE | Freq: Every day | ORAL | 3 refills | Status: DC

## 2020-09-20 MED ORDER — DULOXETINE HCL 60 MG PO CPEP: 120.0000 mg | ORAL_CAPSULE | Freq: Every day | ORAL | 3 refills | Status: DC

## 2020-09-20 MED ORDER — METHYLPHENIDATE HCL 10 MG PO TABS: 10.0000 mg | ORAL_TABLET | Freq: Two times a day (BID) | ORAL | 0 refills | Status: DC

## 2020-09-20 NOTE — Progress Notes (Signed)
Virtual Visit via Telephone Note  I connected with Pamela Walters on 09/20/20 at 10:40 AM EDT by telephone and verified that I am speaking with the correct person using two identifiers.  Location: Patient: home Provider: home office   I discussed the limitations, risks, security and privacy concerns of performing an evaluation and management service by telephone and the availability of in person appointments. I also discussed with the patient that there may be a patient responsible charge related to this service. The patient expressed understanding and agreed to proceed.     I discussed the assessment and treatment plan with the patient. The patient was provided an opportunity to ask questions and all were answered. The patient agreed with the plan and demonstrated an understanding of the instructions.   The patient was advised to call back or seek an in-person evaluation if the symptoms worsen or if the condition fails to improve as anticipated.  I provided 15 minutes of non-face-to-face time during this encounter.   [name], MD  [facility] MD/PA/NP OP Progress Note  09/20/2020 11:24 AM Pamela Walters  MRN:  [id number]  Chief Complaint:  Chief Complaint    Depression; Anxiety; Follow-up     HPI: This patient is a 60year-old married white female who lives with her husband in [place].  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  Patient returns for follow-up after long absence.  She was last seen about 10 months ago.  She still has a lot of fatigue and lots of loss of energy and motivation.  She said a lot of this started after she got COVID 2 years ago.  She is on Provigil but it does seem like it is helping anymore.  She gets depressed because she cannot do or does not feel like doing the things she usually could do in the past.  I suggested that we try a low-dose stimulant instead of the Provigil and she is willing to give this a try.  She is still functioning and doing her  job but just does not feel like she is enjoying much of anything right now. Visit Diagnosis:    ICD-10-CM   1. Major depressive disorder, recurrent episode, moderate (HCC)  F33.1     Past Psychiatric History: none  Past Medical History:  Past Medical History:  Diagnosis Date  . Allergy   . Anemia   . Anxiety   . Arthritis   . Back pain   . Bell's palsy 08/2017  . Depression   . Depression    Phreesia 05/19/2020  . GERD (gastroesophageal reflux disease)   . Hepatic steatosis   . Hiatal hernia   . History of Bell's palsy 02/11/2018  . Hyperlipidemia   . Hypertension   . Internal hemorrhoids   . MS (multiple sclerosis) ([place])    2007  . Multiple sclerosis ([place])   . Multiple sclerosis ([place]) 04/04/2008   Qualifier: Diagnosis of  By: Moshe Cipro MD, Joycelyn Schmid    . Schatzki's ring   . Sleep apnea    wears CPAP    Past Surgical History:  Procedure Laterality Date  . ABDOMINAL HYSTERECTOMY  2005   fibroids  . CARDIAC CATHETERIZATION N/A 05/04/2016   Procedure: Left Heart Cath and Coronary Angiography;  Surgeon: Jettie Booze, MD;  Location: [facility];  Service: Cardiovascular;  Laterality: N/A;  . COLONOSCOPY    . ESOPHAGOGASTRODUODENOSCOPY N/A 09/18/2013   Procedure: ESOPHAGOGASTRODUODENOSCOPY (EGD);  Surgeon: Jerene Bears, MD;  Location: [facility];  Service: Gastroenterology;  Laterality: N/A;  . UPPER GASTROINTESTINAL ENDOSCOPY    . WISDOM TOOTH EXTRACTION      Family Psychiatric History: see below  Family History:  Family History  Problem Relation Age of Onset  . Hypertension Mother   . Hyperlipidemia Mother   . Depression Mother   . Hypertension Father   . Hypertension Sister   . High Cholesterol Sister   . Healthy Son   . Healthy Daughter   . Colon cancer Neg Hx   . Esophageal cancer Neg Hx   . Rectal cancer Neg Hx   . Stomach cancer Neg Hx   . Colon polyps Neg Hx     Social History:  Social History   Socioeconomic History  . Marital  status: Married    Spouse name: Not on file  . Number of children: Not on file  . Years of education: Not on file  . Highest education level: Not on file  Occupational History  . Not on file  Tobacco Use  . Smoking status: Never Smoker  . Smokeless tobacco: Never Used  Vaping Use  . Vaping Use: Never used  Substance and Sexual Activity  . Alcohol use: No  . Drug use: No  . Sexual activity: Yes    Birth control/protection: Surgical    Comment: hyst  Other Topics Concern  . Not on file  Social History Narrative  . Not on file   Social Determinants of Health   Financial Resource Strain: Low Risk   . Difficulty of Paying Living Expenses: Not very hard  Food Insecurity: No Food Insecurity  . Worried About Charity fundraiser in the Last Year: Never true  . Ran Out of Food in the Last Year: Never true  Transportation Needs: No Transportation Needs  . Lack of Transportation (Medical): No  . Lack of Transportation (Non-Medical): No  Physical Activity: Insufficiently Active  . Days of Exercise per Week: 3 days  . Minutes of Exercise per Session: 30 min  Stress: Stress Concern Present  . Feeling of Stress : Very much  Social Connections: Moderately Isolated  . Frequency of Communication with Friends and Family: Once a week  . Frequency of Social Gatherings with Friends and Family: Once a week  . Attends Religious Services: More than 4 times per year  . Active Member of Clubs or Organizations: No  . Attends Archivist Meetings: Never  . Marital Status: Married    Allergies: No Known Allergies  Metabolic Disorder Labs: Lab Results  Component Value Date   HGBA1C 5.5 05/10/2017   MPG 111 05/10/2017   MPG 108 02/20/2016   No results found for: PROLACTIN Lab Results  Component Value Date   CHOL 262 (H) 06/05/2020   TRIG 204 (H) 06/05/2020   HDL 45 06/05/2020   CHOLHDL 5.8 (H) 06/05/2020   VLDL 34 (H) 06/30/2016   LDLCALC 179 (H) 06/05/2020   LDLCALC 169 (H)  02/06/2020   Lab Results  Component Value Date   TSH 2.480 02/06/2020   TSH 2.36 01/30/2019    Therapeutic Level Labs: No results found for: LITHIUM No results found for: VALPROATE No components found for:  CBMZ  Current Medications: Current Outpatient Medications  Medication Sig Dispense Refill  . methylphenidate (RITALIN) 10 MG tablet Take 1 tablet (10 mg total) by mouth 2 (two) times daily with breakfast and lunch. 60 tablet 0  . amLODipine (NORVASC) 5 MG tablet Take 1 tablet (5  mg total) by mouth daily. 90 tablet 3  . aspirin 81 MG tablet Take 81 mg by mouth daily.    Marland Kitchen buPROPion (WELLBUTRIN XL) 150 MG 24 hr tablet Take 1 tablet (150 mg total) by mouth every morning. 90 tablet 3  . clonazePAM (KLONOPIN) 0.5 MG tablet Take 1 tablet (0.5 mg total) by mouth daily as needed for anxiety. 30 tablet 2  . dexlansoprazole (DEXILANT) 60 MG capsule Take 1 capsule (60 mg total) by mouth daily. 90 capsule 3  . DULoxetine (CYMBALTA) 60 MG capsule Take 2 capsules (120 mg total) by mouth daily. 180 capsule 3  . ezetimibe (ZETIA) 10 MG tablet TAKE 1 TABLET BY MOUTH  DAILY 90 tablet 3  . gabapentin (NEURONTIN) 600 MG tablet Take 1 tablet (600 mg total) by mouth 3 (three) times daily. 270 tablet 3  . LORazepam (ATIVAN) 1 MG tablet Take one tablet 30 minutes before test, may repeat once after 15 minutes if needed (Patient not taking: No sig reported) 2 tablet 0  . metaxalone (SKELAXIN) 800 MG tablet Take one tablet by mouth two times daily for  Muscle spasm 60 tablet 5  . mirabegron ER (MYRBETRIQ) 25 MG TB24 tablet Take 1 tablet (25 mg total) by mouth daily. 90 tablet 1  . mometasone (NASONEX) 50 MCG/ACT nasal spray USE 2 SPRAYS NASALLY DAILY 51 g 1  . Multiple Vitamin (MULTIVITAMIN WITH MINERALS) TABS tablet Take 1 tablet by mouth daily.    . prazosin (MINIPRESS) 1 MG capsule Take 1 capsule (1 mg total) by mouth at bedtime. 90 capsule 3  . rosuvastatin (CRESTOR) 40 MG tablet TAKE 1 TABLET BY MOUTH   DAILY 90 tablet 3  . traMADol (ULTRAM) 50 MG tablet TAKE 1 TO 2 TABLETS BY  MOUTH UP TO 4 PILLS DAILY  AS NEEDED , BUT NO MORE THAN 90 TABLETS PER MONTH 270 tablet 1   No current facility-administered medications for this visit.   Facility-Administered Medications Ordered in Other Visits  Medication Dose Route Frequency Provider Last Rate Last Admin  . gadopentetate dimeglumine (MAGNEVIST) injection 20 mL  20 mL Intravenous Once PRN Sater, Nanine Means, MD         Musculoskeletal: Strength & Muscle Tone: within normal limits Gait & Station: normal Patient leans: N/A  Psychiatric Specialty Exam: Review of Systems  Constitutional: Positive for fatigue.  Musculoskeletal: Positive for back pain.  Psychiatric/Behavioral: Positive for decreased concentration and dysphoric mood.  All other systems reviewed and are negative.   There were no vitals taken for this visit.There is no height or weight on file to calculate BMI.  General Appearance: NA  Eye Contact:  NA  Speech:  Clear and Coherent  Volume:  Normal  Mood:  Dysphoric  Affect:  NA  Thought Process:  Goal Directed  Orientation:  Full (Time, Place, and Person)  Thought Content: Rumination   Suicidal Thoughts:  No  Homicidal Thoughts:  No  Memory:  Immediate;   Good Recent;   Good Remote;   Good  Judgement:  Good  Insight:  Good  Psychomotor Activity:  Decreased  Concentration:  Concentration: Fair and Attention Span: Fair  Recall:  Good  Fund of Knowledge: Good  Language: Good  Akathisia:  No  Handed:  Right  AIMS (if indicated): not done  Assets:  Communication Skills Desire for Improvement Resilience Social Support Talents/Skills  ADL's:  Intact  Cognition: WNL  Sleep:  Good   Screenings: GAD-7   Personnel officer Visit  from 06/05/2020 in [facility]  Total GAD-7 Score 14    PHQ2-9   Flowsheet Row Video Visit from 09/20/2020 in [facility] Video Visit  from 06/10/2020 in [facility] Office Visit from 06/05/2020 in [facility] Office Visit from 02/06/2020 in [facility] Office Visit from 06/20/2019 in [facility]  PHQ-2 Total Score 2 2 3 6 4   PHQ-9 Total Score 14 8 16 24 12        Assessment and Plan: This patient is a 60 year old female with a history depression anxiety chronic fatigue and fibromyalgia.  The COVID seems to have made her chronic fatigue worse and she may be suffering from [place] as well.  Since the Provigil is not really helping with the fatigue we will switch to methylphenidate 10 mg twice daily.  She will continue Wellbutrin XL 150 mg daily as well as Cymbalta 120 mg daily for depression prazosin 1 mg at bedtime for nightmares and clonazepam 0.5 mg daily as needed for anxiety.  She will return to see me in 3 weeks   [name], MD 09/20/2020, 11:24 AM

## 2020-09-30 ENCOUNTER — Ambulatory Visit: Payer: BC Managed Care – PPO | Admitting: Family Medicine

## 2020-10-02 ENCOUNTER — Encounter (HOSPITAL_COMMUNITY): Payer: Self-pay | Admitting: Psychiatry

## 2020-10-02 ENCOUNTER — Other Ambulatory Visit: Payer: Self-pay

## 2020-10-02 ENCOUNTER — Ambulatory Visit (INDEPENDENT_AMBULATORY_CARE_PROVIDER_SITE_OTHER): Payer: BC Managed Care – PPO | Admitting: Psychiatry

## 2020-10-02 DIAGNOSIS — F331 Major depressive disorder, recurrent, moderate: Secondary | ICD-10-CM | POA: Diagnosis not present

## 2020-10-02 NOTE — Progress Notes (Signed)
Virtual Visit via Video Note  I connected with Pamela Walters on 10/02/20 at 11:00 AM EDT by a video enabled telemedicine application and verified that I am speaking with the correct person using two identifiers.  Location: Patient: Home Provider: [place] office    I discussed the limitations of evaluation and management by telemedicine and the availability of in person appointments. The patient expressed understanding and agreed to proceed.  I provided 46 minutes of non-face-to-face time during this encounter.   Alonza Smoker, LCSW    Comprehensive Clinical Assessment (CCA) Note  10/02/2020 Pamela Walters [id number]  Chief Complaint:  Chief Complaint  Patient presents with  . Depression   Visit Diagnosis: MDD   CCA Biopsychosocial Intake/Chief Complaint:  " I have had a lot of things weighing me down and I don't want to get back to where I was in the past. Stressors are family conflict and work"  Current Symptoms/Problems: irritability, worry, impatient, poor concentration, no appetite, poor motivation   Patient Reported Schizophrenia/Schizoaffective Diagnosis in Past: No   Strengths: desire for improvemnt  Preferences: compassionate  Abilities: nursing skills   Type of Services Patient Feels are Needed: Inidvidual therapy   Initial Clinical Notes/Concerns: Patient is seeking services due to symptoms of depression. She denies any psychiatric hospitalizations. She has been seen by this clinician and the psychiatrist in this practice for several years.   Mental Health Symptoms Depression:  Difficulty Concentrating; Fatigue; Increase/decrease in appetite; Irritability; Sleep (too much or little); Tearfulness   Duration of Depressive symptoms: Greater than two weeks   Mania:  None   Anxiety:   Difficulty concentrating; Fatigue; Irritability; Sleep; Tension; Worrying; Restlessness   Psychosis:  None   Duration of Psychotic symptoms: No  data recorded  Trauma:  None   Obsessions:  None   Compulsions:  None   Inattention:  None   Hyperactivity/Impulsivity:  N/A   Oppositional/Defiant Behaviors:  None   Emotional Irregularity:  None   Other Mood/Personality Symptoms:  No data recorded   Mental Status Exam Appearance and self-care  Stature:  Average   Weight:  Overweight   Clothing:  No data recorded  Grooming:  Normal   Cosmetic use:  Age appropriate   Posture/gait:  No data recorded  Motor activity:  No data recorded  Sensorium  Attention:  Normal   Concentration:  Anxiety interferes   Orientation:  X5   Recall/memory:  Defective in Short-term; Defective in Remote   Affect and Mood  Affect:  Appropriate   Mood:  Depressed; Anxious   Relating  Eye contact:  No data recorded  Facial expression:  Responsive   Attitude toward examiner:  Cooperative   Thought and Language  Speech flow: Normal   Thought content:  Appropriate to Mood and Circumstances   Preoccupation:  Ruminations   Hallucinations:  None   Organization:  No data recorded  [employer] of Knowledge:  Average   Intelligence:  Average   Abstraction:  Normal   Judgement:  Good   Reality Testing:  Realistic   Insight:  Good   Decision Making:  Normal   Social Functioning  Social Maturity:  Responsible   Social Judgement:  Normal   Stress  Stressors:  Family conflict ("Christmas got ugly over COVID and the masks, family members aren't speaking to each other like they used to")   Coping Ability:  Exhausted   Skill Deficits:  No data recorded  Supports:  Church; Family;  Friends/Service system     Religion: Religion/Spirituality Are You A Religious Person?: Yes What is Your Religious Affiliation?: Personal assistant: Leisure / Recreation Do You Have Hobbies?: No Leisure and Hobbies: can't  get interested in anything  Exercise/Diet: Exercise/Diet Do You Exercise?: Yes What Type  of Exercise Do You Do?:  (chair exercises, walking is difficult due to needing knee replacements) How Many Times a Week Do You Exercise?: 1-3 times a week Have You Gained or Lost A Significant Amount of Weight in the Past Six Months?: No Do You Follow a Special Diet?: No Do You Have Any Trouble Sleeping?: Yes Explanation of Sleeping Difficulties: wanting to stay in bed alot, sleeping excessively   CCA Employment/Education Employment/Work Situation: Employment / Work Situation Employment situation: Employed Where is patient currently employed?: [employer] How long has patient been employed?: 5  years Patient's job has been impacted by current illness: Yes Describe how patient's job has been impacted: irritability, poor concentration What is the longest time patient has a held a job?: 20 years Where was the patient employed at that time?: [place] Has patient ever been in the [employer]?: No  Education: Education Is Patient Currently Attending School?: No Did Teacher, adult education From [employer]?: Yes Did Physicist, medical?: Yes What Type of College Degree Do you Have?: [employer] ( LPN - diploma) Did You Have Any Chief Technology Officer In School?: none Did You Have An Individualized Education Program (IIEP): No Did You Have Any Difficulty At School?: No   CCA Family/Childhood History Family and Relationship History: Family history Marital status: Married Number of Years Married: 31 What types of issues is patient dealing with in the relationship?: some communication issues Are you sexually active?: Yes Has your sexual activity been affected by drugs, alcohol, medication, or emotional stress?: medication - decreased libido Does patient have children?: Yes How many children?: 2 How is patient's relationship with their children?: get along well with son, relationship with daughter comes and goes  Childhood History:  Childhood History By whom  was/is the patient raised?: Both parents (Mother died when patient was 73 years old.) Additional childhood history information: Pt was born and reared in [facility] Description of patient's relationship with caregiver when they were a child: Father became an alcoholic after mother died, pt's relationship with mother was good Patient's description of current relationship with people who raised him/her: Deceased How were you disciplined when you got in trouble as a child/adolescent?: spankings Does patient have siblings?: Yes Number of Siblings: 1 Description of patient's current relationship with siblings: "no relationship with sister, we hardly ever speak" Did patient suffer any verbal/emotional/physical/sexual abuse as a child?: No Did patient suffer from severe childhood neglect?: No Has patient ever been sexually abused/assaulted/raped as an adolescent or adult?: No Was the patient ever a victim of a crime or a disaster?: No Witnessed domestic violence?: No Has patient been affected by domestic violence as an adult?: No  Child/Adolescent Assessment:     CCA Substance Use Alcohol/Drug Use: Alcohol / Drug Use Pain Medications: see patient record Prescriptions: see patient record Over the Counter: see patient record History of alcohol / drug use?: No history of alcohol / drug abuse    ASAM's:  Six Dimensions of Multidimensional Assessment  Dimension 1:  Acute Intoxication and/or Withdrawal Potential:   Dimension 1:  Description of individual's past and current experiences of substance use and withdrawal: none  Dimension 2:  Biomedical Conditions and Complications:  Dimension 2:  Description of patient's biomedical conditions and  complications: none  Dimension 3:  Emotional, Behavioral, or Cognitive Conditions and Complications:    Dimension 4:  Readiness to Change:    Dimension 5:  Relapse, Continued use, or Continued Problem Potential:    Dimension 6:  Recovery/Living  Environment:    ASAM Severity Score: ASAM's Severity Rating Score: 0  ASAM Recommended Level of Treatment:     Substance use Disorder (SUD) N/A  Recommendations for Services/Supports/Treatments: Recommendations for Services/Supports/Treatments Recommendations For Services/Supports/Treatments: Individual Therapy,Medication Management/patient attends the assessment appointment today.  Nutritional assessment, pain assessment, PHQ 2 and 9 with C-SS RS administered.  Patient agrees to return for an appointment in 2 weeks.  She will continue to see psychiatrist Dr. Harrington Challenger for medication management.  Individual therapy is recommended 1 time every 1 to 4 weeks to learn and implement cognitive and behavioral strategies to overcome depression, resume normal interest/involvement in activities, decrease anxiety and worry.-----------------------  CMK3 Diagnoses: Patient Active Problem List   Diagnosis Date Noted  . New daily persistent headache (ndph) 06/16/2020  . Head trauma, sequela 06/16/2020  . Head trauma 06/10/2020  . Hemorrhoids 06/05/2020  . S/P hysterectomy 06/05/2020  . Other headache syndrome 09/13/2019  . Fidgeting 09/13/2019  . Dysphagia 06/24/2019  . History of 2019 novel coronavirus disease (COVID-19) 06/20/2019  . Knee pain 01/30/2019  . Recurrent cold sores 03/11/2018  . Dyspepsia 03/07/2018  . History of multiple sclerosis ([place]) 02/11/2018  . Neck pain 02/11/2018  . History of Bell's palsy 02/11/2018  . Abnormal brain MRI 05/19/2017  . Carpal tunnel syndrome, bilateral 05/19/2017  . Abnormal stress test   . Allergic rhinitis 06/20/2015  . Dysesthesia 03/04/2015  . OSA (obstructive sleep apnea) 10/01/2014  . Increased homocysteine 10/01/2014  . Urinary incontinence 05/30/2014  . Back pain with radiation 02/05/2013  . DISORDER OF BONE AND CARTILAGE UNSPECIFIED 12/16/2009  . Overweight (BMI 25.0-29.9) 04/08/2008  . Depression with anxiety 12/18/2007  . Headache 12/18/2007   . Hyperlipidemia LDL goal <100 12/16/2007  . Essential hypertension 12/16/2007    Patient Centered Plan: Patient is on the following Treatment Plan(s): Will be developed next session Referrals to Alternative Service(s): Referred to Alternative Service(s):   Place:   Date:   Time:    Referred to Alternative Service(s):   Place:   Date:   Time:    Referred to Alternative Service(s):   Place:   Date:   Time:    Referred to Alternative Service(s):   Place:   Date:   Time:     Alonza Smoker, LCSW

## 2020-10-08 NOTE — Progress Notes (Deleted)
Office Visit Note  Patient: Pamela Walters             Date of Birth: 08/03/1960           MRN: [id number]             PCP: Fayrene Helper, MD Referring: Fayrene Helper, MD Visit Date: 10/22/2020 Occupation: @GUAROCC @  Subjective:  No chief complaint on file.   History of Present Illness: Pamela Walters is a 60 y.o. female ***   Activities of Daily Living:  Patient reports morning stiffness for *** {minute/hour:19697}.   Patient {ACTIONS;DENIES/REPORTS:21021675::"Denies"} nocturnal pain.  Difficulty dressing/grooming: {ACTIONS;DENIES/REPORTS:21021675::"Denies"} Difficulty climbing stairs: {ACTIONS;DENIES/REPORTS:21021675::"Denies"} Difficulty getting out of chair: {ACTIONS;DENIES/REPORTS:21021675::"Denies"} Difficulty using hands for taps, buttons, cutlery, and/or writing: {ACTIONS;DENIES/REPORTS:21021675::"Denies"}  No Rheumatology ROS completed.   PMFS History:  Patient Active Problem List   Diagnosis Date Noted   New daily persistent headache (ndph) 06/16/2020   Head trauma, sequela 06/16/2020   Head trauma 06/10/2020   Hemorrhoids 06/05/2020   S/P hysterectomy 06/05/2020   Other headache syndrome 09/13/2019   Fidgeting 09/13/2019   Dysphagia 06/24/2019   History of 2019 novel coronavirus disease (COVID-19) 06/20/2019   Knee pain 01/30/2019   Recurrent cold sores 03/11/2018   Dyspepsia 03/07/2018   History of multiple sclerosis ([place]) 02/11/2018   Neck pain 02/11/2018   History of Bell's palsy 02/11/2018   Abnormal brain MRI 05/19/2017   Carpal tunnel syndrome, bilateral 05/19/2017   Abnormal stress test    Allergic rhinitis 06/20/2015   Dysesthesia 03/04/2015   OSA (obstructive sleep apnea) 10/01/2014   Increased homocysteine 10/01/2014   Urinary incontinence 05/30/2014   Back pain with radiation 02/05/2013   DISORDER OF BONE AND CARTILAGE UNSPECIFIED 12/16/2009   Overweight (BMI 25.0-29.9) 04/08/2008   Depression with anxiety 12/18/2007    Headache 12/18/2007   Hyperlipidemia LDL goal <100 12/16/2007   Essential hypertension 12/16/2007    Past Medical History:  Diagnosis Date   Allergy    Anemia    Anxiety    Arthritis    Back pain    Bell's palsy 08/2017   Depression    Depression    Phreesia 05/19/2020   GERD (gastroesophageal reflux disease)    Hepatic steatosis    Hiatal hernia    History of Bell's palsy 02/11/2018   Hyperlipidemia    Hypertension    Internal hemorrhoids    MS (multiple sclerosis) ([place])    2007   Multiple sclerosis ([place])    Multiple sclerosis ([place]) 04/04/2008   Qualifier: Diagnosis of  By: Moshe Cipro MD, Margaret     Schatzki's ring    Sleep apnea    wears CPAP    Family History  Problem Relation Age of Onset   Hypertension Mother    Hyperlipidemia Mother    Depression Mother    Hypertension Father    Hypertension Sister    High Cholesterol Sister    Healthy Son    Healthy Daughter    Colon cancer Neg Hx    Esophageal cancer Neg Hx    Rectal cancer Neg Hx    Stomach cancer Neg Hx    Colon polyps Neg Hx    Past Surgical History:  Procedure Laterality Date   ABDOMINAL HYSTERECTOMY  2005   fibroids   CARDIAC CATHETERIZATION N/A 05/04/2016   Procedure: Left Heart Cath and Coronary Angiography;  Surgeon: Jettie Booze, MD;  Location: [facility];  Service: Cardiovascular;  Laterality: N/A;   COLONOSCOPY  ESOPHAGOGASTRODUODENOSCOPY N/A 09/18/2013   Procedure: ESOPHAGOGASTRODUODENOSCOPY (EGD);  Surgeon: Jerene Bears, MD;  Location: [facility];  Service: Gastroenterology;  Laterality: N/A;   UPPER GASTROINTESTINAL ENDOSCOPY     WISDOM TOOTH EXTRACTION     Social History   Social History Narrative   Not on file   Immunization History  Administered Date(s) Administered   Influenza Split 01/26/2014   Influenza,inj,Quad PF,6+ Mos 02/06/2020   PFIZER(Purple Top)SARS-COV-2 Vaccination 08/12/2019, 09/05/2019, 06/05/2020   Tdap 03/17/2013   Zoster Recombinat  (Shingrix) 10/20/2018, 01/30/2019     Objective: Vital Signs: There were no vitals taken for this visit.   Physical Exam   Musculoskeletal Exam:   CDAI Exam: CDAI Score: -- Patient Global: --; Provider Global: -- Swollen: --; Tender: -- Joint Exam 10/22/2020   No joint exam has been documented for this visit   There is currently no information documented on the homunculus. Go to the Rheumatology activity and complete the homunculus joint exam.  Investigation: No additional findings.  Imaging: No results found.  Recent Labs: Lab Results  Component Value Date   WBC 6.9 02/06/2020   HGB 13.1 02/06/2020   PLT 363 02/06/2020   NA 141 06/05/2020   K 3.8 06/05/2020   CL 96 06/05/2020   CO2 28 06/05/2020   GLUCOSE 81 06/05/2020   BUN 13 06/05/2020   CREATININE 1.06 (H) 06/05/2020   BILITOT 0.3 06/05/2020   ALKPHOS 111 06/05/2020   AST 22 06/05/2020   ALT 12 06/05/2020   PROT 7.3 06/05/2020   ALBUMIN 4.8 06/05/2020   CALCIUM 9.8 06/05/2020   GFRAA 65 06/05/2020    Speciality Comments: No specialty comments available.  Procedures:  No procedures performed Allergies: Patient has no known allergies.   Assessment / Plan:     Visit Diagnoses: No diagnosis found.  Orders: No orders of the defined types were placed in this encounter.  No orders of the defined types were placed in this encounter.   Face-to-face time spent with patient was *** minutes. Greater than 50% of time was spent in counseling and coordination of care.  Follow-Up Instructions: No follow-ups on file.   Earnestine Mealing, CMA  Note - This record has been created using Editor, commissioning.  Chart creation errors have been sought, but may not always  have been located. Such creation errors do not reflect on  the standard of medical care.

## 2020-10-11 ENCOUNTER — Other Ambulatory Visit: Payer: Self-pay

## 2020-10-11 ENCOUNTER — Encounter (HOSPITAL_COMMUNITY): Payer: Self-pay | Admitting: Psychiatry

## 2020-10-11 ENCOUNTER — Telehealth (INDEPENDENT_AMBULATORY_CARE_PROVIDER_SITE_OTHER): Payer: BC Managed Care – PPO | Admitting: Psychiatry

## 2020-10-11 DIAGNOSIS — F331 Major depressive disorder, recurrent, moderate: Secondary | ICD-10-CM

## 2020-10-11 MED ORDER — MODAFINIL 200 MG PO TABS: 200.0000 mg | ORAL_TABLET | Freq: Every day | ORAL | 0 refills | Status: DC

## 2020-10-11 MED ORDER — BUPROPION HCL ER (XL) 150 MG PO TB24: 1.0000 | ORAL_TABLET | Freq: Every morning | ORAL | 3 refills | Status: DC

## 2020-10-11 MED ORDER — PRAZOSIN HCL 1 MG PO CAPS: 1.0000 mg | ORAL_CAPSULE | Freq: Every day | ORAL | 3 refills | Status: DC

## 2020-10-11 MED ORDER — DULOXETINE HCL 60 MG PO CPEP: 120.0000 mg | ORAL_CAPSULE | Freq: Every day | ORAL | 3 refills | Status: DC

## 2020-10-11 MED ORDER — CLONAZEPAM 0.5 MG PO TABS: 0.5000 mg | ORAL_TABLET | Freq: Every day | ORAL | 2 refills | Status: DC | PRN

## 2020-10-11 NOTE — Progress Notes (Signed)
Virtual Visit via Video Note  I connected with Pamela Walters on 10/11/20 at 10:40 AM EDT by a video enabled telemedicine application and verified that I am speaking with the correct person using two identifiers.  Location: Patient: home Provider: home office   I discussed the limitations of evaluation and management by telemedicine and the availability of in person appointments. The patient expressed understanding and agreed to proceed.     I discussed the assessment and treatment plan with the patient. The patient was provided an opportunity to ask questions and all were answered. The patient agreed with the plan and demonstrated an understanding of the instructions.   The patient was advised to call back or seek an in-person evaluation if the symptoms worsen or if the condition fails to improve as anticipated.  I provided 15 minutes of non-face-to-face time during this encounter.   [name], MD  [facility] MD/PA/NP OP Progress Note  10/11/2020 11:04 AM Pamela Walters  MRN:  [id number]  Chief Complaint:  Chief Complaint   Anxiety; Depression; Follow-up    HPI: This patient is a 60year-old married white female who lives with her husband in [place].  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  We had tried methylphenidate 10 mg twice daily to help with her focus and alertness and fatigue.  However she stated did not think it made her much more nauseated and tired.  She states she would like to go back to the Provigil.  She is back sitting up on the [place] for counseling and this is been helpful.  She thinks to the the situation with her family has been improving and this is helped her mood.  She really does not want to make any other medication changes right now.  She denies thoughts of self-harm or suicidal ideation. Visit Diagnosis:    ICD-10-CM   1. Major depressive disorder, recurrent episode, moderate ([place])  F33.1        Past Psychiatric History: none  Past Medical History:  Past Medical History:  Diagnosis Date   Allergy    Anemia    Anxiety    Arthritis    Back pain    Bell's palsy 08/2017   Depression    Depression    Phreesia 05/19/2020   GERD (gastroesophageal reflux disease)    Hepatic steatosis    Hiatal hernia    History of Bell's palsy 02/11/2018   Hyperlipidemia    Hypertension    Internal hemorrhoids    MS (multiple sclerosis) ([place])    2007   Multiple sclerosis ([place])    Multiple sclerosis ([place]) 04/04/2008   Qualifier: Diagnosis of  By: Moshe Cipro MD, Margaret     Schatzki's ring    Sleep apnea    wears CPAP    Past Surgical History:  Procedure Laterality Date   ABDOMINAL HYSTERECTOMY  2005   fibroids   CARDIAC CATHETERIZATION N/A 05/04/2016   Procedure: Left Heart Cath and Coronary Angiography;  Surgeon: Jettie Booze, MD;  Location: [facility];  Service: Cardiovascular;  Laterality: N/A;   COLONOSCOPY     ESOPHAGOGASTRODUODENOSCOPY N/A 09/18/2013   Procedure: ESOPHAGOGASTRODUODENOSCOPY (EGD);  Surgeon: Jerene Bears, MD;  Location: [facility];  Service: Gastroenterology;  Laterality: N/A;   UPPER GASTROINTESTINAL ENDOSCOPY     WISDOM TOOTH EXTRACTION      Family Psychiatric History: see below  Family History:  Family History  Problem Relation  Age of Onset   Hypertension Mother    Hyperlipidemia Mother    Depression Mother    Hypertension Father    Hypertension Sister    High Cholesterol Sister    Healthy Son    Healthy Daughter    Colon cancer Neg Hx    Esophageal cancer Neg Hx    Rectal cancer Neg Hx    Stomach cancer Neg Hx    Colon polyps Neg Hx     Social History:  Social History   Socioeconomic History   Marital status: Married    Spouse name: Not on file   Number of children: Not on file   Years of education: Not on file   Highest education level: Not on file  Occupational History   Not on file  Tobacco Use   Smoking status:  Never   Smokeless tobacco: Never  Vaping Use   Vaping Use: Never used  Substance and Sexual Activity   Alcohol use: No   Drug use: No   Sexual activity: Yes    Birth control/protection: Surgical    Comment: hyst  Other Topics Concern   Not on file  Social History Narrative   Not on file   Social Determinants of Health   Financial Resource Strain: Low Risk    Difficulty of Paying Living Expenses: Not very hard  Food Insecurity: No Food Insecurity   Worried About Running Out of Food in the Last Year: Never true   Ran Out of Food in the Last Year: Never true  Transportation Needs: No Transportation Needs   Lack of Transportation (Medical): No   Lack of Transportation (Non-Medical): No  Physical Activity: Insufficiently Active   Days of Exercise per Week: 3 days   Minutes of Exercise per Session: 30 min  Stress: Stress Concern Present   Feeling of Stress : Very much  Social Connections: Moderately Isolated   Frequency of Communication with Friends and Family: Once a week   Frequency of Social Gatherings with Friends and Family: Once a week   Attends Religious Services: More than 4 times per year   Active Member of Clubs or Organizations: No   Attends Archivist Meetings: Never   Marital Status: Married    Allergies: No Known Allergies  Metabolic Disorder Labs: Lab Results  Component Value Date   HGBA1C 5.5 05/10/2017   MPG 111 05/10/2017   MPG 108 02/20/2016   No results found for: PROLACTIN Lab Results  Component Value Date   CHOL 262 (H) 06/05/2020   TRIG 204 (H) 06/05/2020   HDL 45 06/05/2020   CHOLHDL 5.8 (H) 06/05/2020   VLDL 34 (H) 06/30/2016   LDLCALC 179 (H) 06/05/2020   LDLCALC 169 (H) 02/06/2020   Lab Results  Component Value Date   TSH 2.480 02/06/2020   TSH 2.36 01/30/2019    Therapeutic Level Labs: No results found for: LITHIUM No results found for: VALPROATE No components found for:  CBMZ  Current Medications: Current  Outpatient Medications  Medication Sig Dispense Refill   amLODipine (NORVASC) 5 MG tablet Take 1 tablet (5 mg total) by mouth daily. 90 tablet 3   aspirin 81 MG tablet Take 81 mg by mouth daily.     buPROPion (WELLBUTRIN XL) 150 MG 24 hr tablet Take 1 tablet (150 mg total) by mouth every morning. 90 tablet 3   clonazePAM (KLONOPIN) 0.5 MG tablet Take 1 tablet (0.5 mg total) by mouth daily as needed for anxiety. 30 tablet 2  dexlansoprazole (DEXILANT) 60 MG capsule Take 1 capsule (60 mg total) by mouth daily. 90 capsule 3   DULoxetine (CYMBALTA) 60 MG capsule Take 2 capsules (120 mg total) by mouth daily. 180 capsule 3   ezetimibe (ZETIA) 10 MG tablet TAKE 1 TABLET BY MOUTH  DAILY 90 tablet 3   gabapentin (NEURONTIN) 600 MG tablet Take 1 tablet (600 mg total) by mouth 3 (three) times daily. 270 tablet 3   LORazepam (ATIVAN) 1 MG tablet Take one tablet 30 minutes before test, may repeat once after 15 minutes if needed 2 tablet 0   metaxalone (SKELAXIN) 800 MG tablet Take one tablet by mouth two times daily for  Muscle spasm 60 tablet 5   mirabegron ER (MYRBETRIQ) 25 MG TB24 tablet Take 1 tablet (25 mg total) by mouth daily. 90 tablet 1   modafinil (PROVIGIL) 200 MG tablet Take 1 tablet (200 mg total) by mouth daily. CALL [phone] to schedule follow up around 09/12/20 for ongoing refills 90 tablet 0   mometasone (NASONEX) 50 MCG/ACT nasal spray USE 2 SPRAYS NASALLY DAILY 51 g 1   Multiple Vitamin (MULTIVITAMIN WITH MINERALS) TABS tablet Take 1 tablet by mouth daily.     prazosin (MINIPRESS) 1 MG capsule Take 1 capsule (1 mg total) by mouth at bedtime. 90 capsule 3   rosuvastatin (CRESTOR) 40 MG tablet TAKE 1 TABLET BY MOUTH  DAILY 90 tablet 3   traMADol (ULTRAM) 50 MG tablet TAKE 1 TO 2 TABLETS BY  MOUTH UP TO 4 PILLS DAILY  AS NEEDED , BUT NO MORE THAN 90 TABLETS PER MONTH 270 tablet 1   No current facility-administered medications for this visit.   Facility-Administered Medications Ordered  in Other Visits  Medication Dose Route Frequency Provider Last Rate Last Admin   gadopentetate dimeglumine (MAGNEVIST) injection 20 mL  20 mL Intravenous Once PRN Sater, Nanine Means, MD         Musculoskeletal: Strength & Muscle Tone: within normal limits Gait & Station: normal Patient leans: N/A  Psychiatric Specialty Exam: Review of Systems  Constitutional:  Positive for fatigue.  Gastrointestinal:  Positive for nausea.  All other systems reviewed and are negative.  There were no vitals taken for this visit.There is no height or weight on file to calculate BMI.  General Appearance: Casual and Fairly Groomed  Eye Contact:  Good  Speech:  Clear and Coherent  Volume:  Normal  Mood:  Euthymic  Affect:  Appropriate and Congruent  Thought Process:  Goal Directed  Orientation:  Full (Time, Place, and Person)  Thought Content: Rumination   Suicidal Thoughts:  No  Homicidal Thoughts:  No  Memory:  Immediate;   Good Recent;   Good Remote;   Good  Judgement:  Good  Insight:  Good  Psychomotor Activity:  Decreased  Concentration:  Concentration: Good and Attention Span: Good  Recall:  Good  Fund of Knowledge: Good  Language: Good  Akathisia:  No  Handed:  Right  AIMS (if indicated): not done  Assets:  Communication Skills Desire for Improvement Resilience Social Support Talents/Skills  ADL's:  Intact  Cognition: WNL  Sleep:  Good   Screenings: GAD-7    Flowsheet Row Office Visit from 06/05/2020 in [facility]  Total GAD-7 Score 14      PHQ2-9    Flowsheet Row Video Visit from 10/11/2020 in [facility] from 10/02/2020 in [facility] Video Visit from 09/20/2020 in [facility] Video Visit from 06/10/2020 in [facility] Office Visit from 06/05/2020 in [facility]  PHQ-2 Total Score 0 2 2 2 3   PHQ-9 Total Score  -- 12 14 8 16       Flowsheet Row Video Visit from 10/11/2020 in [facility] Counselor from 10/02/2020 in [facility] No Risk No Risk        Assessment and Plan: This patient is a 60 year old female with a history of depression anxiety chronic fatigue and fibromyalgia.  The methylphenidate seem to make her feel worse so we will go back to Provigil 150 mg daily for treatment of fatigue.  She will continue Wellbutrin XL 150 mg as well as Cymbalta 120 mg daily for depression, prazosin 1 mg at bedtime for nightmares and clonazepam 0.5 mg daily as needed for anxiety.  She will return to see me in 2 months   [name], MD 10/11/2020, 11:04 AM

## 2020-10-16 ENCOUNTER — Other Ambulatory Visit: Payer: Self-pay

## 2020-10-16 ENCOUNTER — Ambulatory Visit (INDEPENDENT_AMBULATORY_CARE_PROVIDER_SITE_OTHER): Payer: BC Managed Care – PPO | Admitting: Psychiatry

## 2020-10-16 DIAGNOSIS — F331 Major depressive disorder, recurrent, moderate: Secondary | ICD-10-CM | POA: Diagnosis not present

## 2020-10-16 NOTE — Progress Notes (Signed)
Virtual Visit via Video Note  I connected with [name] on 10/16/20 at 1:08 PM EDT  by a video enabled telemedicine application and verified that I am speaking with the correct person using two identifiers.  Location: Patient:Patient Provider:  [place] office    I discussed the limitations of evaluation and management by telemedicine and the availability of in person appointments. The patient expressed understanding and agreed to proceed.  I provided 47 minutes of non-face-to-face time during this encounter.   Alonza Smoker, LCSW                    THERAPIST PROGRESS NOTE             Session Time:   Wednesday 10/16/2020 1:08 PM - 1:55 PM   Participation Level: Active  Behavioral Response: Casual/ alert, anxious, depressed           Type of Therapy: Individual Therapy        Treatment Goals:                       1. Learn and implement coping and behavioral strategies to overcome depression.      2. Identify and replace thoughts and beliefs that support depression.                                   Interventions: Supportive,  CBT  Summary: Pamela Walters is a 60 y.o. female who is referred for services by PCP Dr. Moshe Cipro due to patient suffering symptoms of depression. Patient has been seen in this office interminttently for medication management and outpatient psychotherapy since 2016. She is resuming services  due to increased stress related to family conflict and her job.  Patient's reports increased irritability, decreased interest in activities, excessive worry, poor concentration, tearfulness, fatigue, restlessness, loss of appetite, and poor motivation.       Patient last was seen by virtual visit via video 2 weeks ago.  She reports little to no change in symptoms since that time.  She reports continued stress regarding family issues and her job.  Patient expresses fear and worry about her family as dynamics are changing.  Family members are  not as close and often have conflict.  They are still issues regarding various members response to wearing the mask and not wearing the mask as a result of the pandemic.  She also expresses frustration regarding her daughter.       Suicidal/Homicidal: No  Therapist Response:  Reviewed symptoms, discussed stressors, facilitated expression of thoughts and feelings, validated feelings, assisted patient began to examine her pattern of interaction with the family, her expectations of the family, also began to help patient discriminate between thoughts and feelings, developed plan with patient to begin journaling daily  Plan: Return again in 2 weeks.            Diagnosis: Axis I: Major Depressive Disorder, Recurrent, Moderate    Axis II: No diagnosis    Alonza Smoker, LCSW 10/16/2020

## 2020-10-22 ENCOUNTER — Ambulatory Visit: Payer: BC Managed Care – PPO | Admitting: Rheumatology

## 2020-10-26 ENCOUNTER — Other Ambulatory Visit: Payer: Self-pay | Admitting: Family Medicine

## 2020-10-29 ENCOUNTER — Ambulatory Visit: Payer: BC Managed Care – PPO | Admitting: Family Medicine

## 2020-10-31 ENCOUNTER — Other Ambulatory Visit: Payer: Self-pay

## 2020-10-31 ENCOUNTER — Ambulatory Visit (INDEPENDENT_AMBULATORY_CARE_PROVIDER_SITE_OTHER): Payer: BC Managed Care – PPO | Admitting: Psychiatry

## 2020-10-31 DIAGNOSIS — F331 Major depressive disorder, recurrent, moderate: Secondary | ICD-10-CM

## 2020-10-31 NOTE — Progress Notes (Signed)
Virtual Visit via Video Note  I connected with Pamela Walters on 10/31/20 at 10:07 AM EDT by a video enabled telemedicine application and verified that I am speaking with the correct person using two identifiers.  Location: Patient: Home  Provider: [facility] office    I discussed the limitations of evaluation and management by telemedicine and the availability of in person appointments. The patient expressed understanding and agreed to proceed.   I provided 58 minutes of non-face-to-face time during this encounter.   Alonza Smoker, LCSW                    THERAPIST PROGRESS NOTE             Session Time:  Thursday 10/31/2020 10:07 AM - 11:05 AM   Participation Level: Active  Behavioral Response: Casual/ alert, anxious, depressed           Type of Therapy: Individual Therapy        Treatment Goals: Elevate mood and show evidence of usual energy, activities, and socialization level , resolve conflicted  feelings and adapt to new life circumstances AEB by patient implementing 3 new activities that increase a level of satisfaction consistently for 3 weeks patient identifying 5 advantages of current life situation                                                 Interventions: Supportive,  CBT  Summary: Pamela Walters is a 60 y.o. female who is referred for services by PCP Dr. Moshe Cipro due to patient suffering symptoms of depression. Patient has been seen in this office interminttently for medication management and outpatient psychotherapy since 2016. She is resuming services  due to increased stress related to family conflict and her job.  Patient's reports increased irritability, decreased interest in activities, excessive worry, poor concentration, tearfulness, fatigue, restlessness, loss of appetite, and poor motivation.       Patient last was seen by virtual visit via video 2 weeks ago.  She reports little to no change in symptoms since that time.  She  continues to experience poor motivation, lack of drive, diminished interest and pleasure in activities, and excessive worry.  She reports continued stress regarding family issues and her job.  She reports [place] her granddaughter's birthday but reports not enjoying this event because of the change in family dynamics.  She reports feeling excluded and confused about her role in her family.  She has thoughts of people not really caring about her feelings and expresses feelings of abandonment.  Patient reports she has not been able to journal.  Suicidal/Homicidal: No  Therapist Response:  Reviewed symptoms, discussed stressors, facilitated expression of thoughts and feelings, validated feelings, developed treatment plan, obtained patient's verbal consent to initial plan for patient as this was a virtual visit, began to assist patient identify losses she has incurred due to the change in family dynamics/connections, validated and normalized feelings related to grief and loss, developed plan with patient to write the way her relationship has changed with her oldest 2 granddaughters and her expectations regarding her future relationship with her granddaughters Plan: Return again in 2 weeks.            Diagnosis: Axis I: Major Depressive Disorder, Recurrent, Moderate    Axis II:  No diagnosis    Alonza Smoker, LCSW 10/31/2020

## 2020-11-05 ENCOUNTER — Ambulatory Visit: Payer: BC Managed Care – PPO | Admitting: Family Medicine

## 2020-11-13 ENCOUNTER — Other Ambulatory Visit: Payer: Self-pay

## 2020-11-13 ENCOUNTER — Ambulatory Visit (INDEPENDENT_AMBULATORY_CARE_PROVIDER_SITE_OTHER): Payer: BC Managed Care – PPO | Admitting: Psychiatry

## 2020-11-13 DIAGNOSIS — F331 Major depressive disorder, recurrent, moderate: Secondary | ICD-10-CM

## 2020-11-13 NOTE — Progress Notes (Signed)
Virtual Visit via Video Note  I connected with Pamela Walters on 11/13/20 at 1:05 PM EDT  by a video enabled telemedicine application and verified that I am speaking with the correct person using two identifiers.  Location: Patient:  Home Provider:  [place] office   I discussed the limitations of evaluation and management by telemedicine and the availability of in person appointments. The patient expressed understanding and agreed to proceed.  I provided 53 minutes of non-face-to-face time during this encounter.   Alonza Smoker, LCSW                     THERAPIST PROGRESS NOTE             Session Time:  Wednesday 11/13/2020 1:05 PM  -1:58 PM   Participation Level: Active  Behavioral Response: Casual/ alert, anxious, depressed           Type of Therapy: Individual Therapy        Treatment Goals: Elevate mood and show evidence of usual energy, activities, and socialization level , resolve conflicted  feelings and adapt to new life circumstances AEB by patient implementing 3 new activities that increase a level of satisfaction consistently for 3 weeks patient identifying 5 advantages of current life situation                                                 Interventions: Supportive,  CBT  Summary: Pamela Walters is a 60 y.o. female who is referred for services by PCP Dr. Moshe Cipro due to patient suffering symptoms of depression. Patient has been seen in this office interminttently for medication management and outpatient psychotherapy since 2016. She is resuming services  due to increased stress related to family conflict and her job.  Patient's reports increased irritability, decreased interest in activities, excessive worry, poor concentration, tearfulness, fatigue, restlessness, loss of appetite, and poor motivation.       Patient last was seen by virtual visit via video 2 weeks ago.  She reports little to no change in symptoms since that time.  She  continues to experience poor motivation, lack of drive, diminished interest and pleasure in activities, poor appetite, and excessive worry.  Patient also reports fatigue and skipping meals.  Patient reports wanting to do things but just not having the energy.  Per her report, she eats only 1 meal per day and says that sometimes this is not a full meal.  She reports recently having an emotional breakdown or her job.  She reports continued stress regarding family issues and her job.  Patient reports completing homework assigned for this session.  Suicidal/Homicidal: No  Therapist Response:  Reviewed symptoms, assisted patient identify triggers of recent emotional breakdown, discussed the role of positive self-care regarding eating, sleeping, exercise in coping with stress and managing depression, assisted patient identify ways to improve eating patterns including setting an alarm to remind patient to eat, meal planning and prep, developed plan with patient to eat 3 meals per day and to record what she has eaten during the day, assisted patient develop list of reasons for improving eating patterns, developed plan with patient to read list daily, assisted patient identify and address thoughts and processes that may inhibit implementation of plan, praised and reinforced patient's efforts to complete her homework, agreed to discuss at next  session,  Plan: Return again in 2 weeks.            Diagnosis: Axis I: Major Depressive Disorder, Recurrent, Moderate    Axis II: No diagnosis    Alonza Smoker, LCSW 11/13/2020

## 2020-11-19 ENCOUNTER — Ambulatory Visit: Payer: BC Managed Care – PPO | Admitting: Family Medicine

## 2020-11-25 DIAGNOSIS — I10 Essential (primary) hypertension: Secondary | ICD-10-CM | POA: Diagnosis not present

## 2020-11-25 DIAGNOSIS — E785 Hyperlipidemia, unspecified: Secondary | ICD-10-CM | POA: Diagnosis not present

## 2020-11-25 DIAGNOSIS — X58XXXA Exposure to other specified factors, initial encounter: Secondary | ICD-10-CM | POA: Diagnosis not present

## 2020-11-25 DIAGNOSIS — I7789 Other specified disorders of arteries and arterioles: Secondary | ICD-10-CM | POA: Diagnosis not present

## 2020-11-25 DIAGNOSIS — N3281 Overactive bladder: Secondary | ICD-10-CM | POA: Diagnosis not present

## 2020-11-25 DIAGNOSIS — R42 Dizziness and giddiness: Secondary | ICD-10-CM | POA: Diagnosis not present

## 2020-11-25 DIAGNOSIS — S0990XA Unspecified injury of head, initial encounter: Secondary | ICD-10-CM | POA: Diagnosis not present

## 2020-11-25 DIAGNOSIS — G35 Multiple sclerosis: Secondary | ICD-10-CM | POA: Diagnosis not present

## 2020-11-25 DIAGNOSIS — D72829 Elevated white blood cell count, unspecified: Secondary | ICD-10-CM | POA: Diagnosis not present

## 2020-11-25 DIAGNOSIS — Z20822 Contact with and (suspected) exposure to covid-19: Secondary | ICD-10-CM | POA: Diagnosis not present

## 2020-11-25 DIAGNOSIS — W1830XA Fall on same level, unspecified, initial encounter: Secondary | ICD-10-CM | POA: Diagnosis not present

## 2020-11-25 DIAGNOSIS — Z043 Encounter for examination and observation following other accident: Secondary | ICD-10-CM | POA: Diagnosis not present

## 2020-11-25 DIAGNOSIS — S0003XA Contusion of scalp, initial encounter: Secondary | ICD-10-CM | POA: Diagnosis not present

## 2020-11-25 DIAGNOSIS — M47816 Spondylosis without myelopathy or radiculopathy, lumbar region: Secondary | ICD-10-CM | POA: Diagnosis not present

## 2020-11-25 DIAGNOSIS — S0083XA Contusion of other part of head, initial encounter: Secondary | ICD-10-CM | POA: Diagnosis not present

## 2020-11-25 DIAGNOSIS — M47812 Spondylosis without myelopathy or radiculopathy, cervical region: Secondary | ICD-10-CM | POA: Diagnosis not present

## 2020-11-25 DIAGNOSIS — G8911 Acute pain due to trauma: Secondary | ICD-10-CM | POA: Diagnosis not present

## 2020-11-25 DIAGNOSIS — M797 Fibromyalgia: Secondary | ICD-10-CM | POA: Diagnosis not present

## 2020-11-25 DIAGNOSIS — T07XXXA Unspecified multiple injuries, initial encounter: Secondary | ICD-10-CM | POA: Diagnosis not present

## 2020-11-25 DIAGNOSIS — R55 Syncope and collapse: Secondary | ICD-10-CM | POA: Diagnosis not present

## 2020-11-25 DIAGNOSIS — S060X0A Concussion without loss of consciousness, initial encounter: Secondary | ICD-10-CM | POA: Diagnosis not present

## 2020-11-25 DIAGNOSIS — S060X9A Concussion with loss of consciousness of unspecified duration, initial encounter: Secondary | ICD-10-CM | POA: Diagnosis not present

## 2020-11-25 DIAGNOSIS — R110 Nausea: Secondary | ICD-10-CM | POA: Diagnosis not present

## 2020-11-25 NOTE — Progress Notes (Signed)
Office Visit Note  Patient: Pamela Walters             Date of Birth: [birth year]           MRN: [id number]             PCP: Fayrene Helper, MD Referring: Fayrene Helper, MD Visit Date: 12/09/2020 Occupation: '@GUAROCC'$ @  Subjective:  Pain in both knees .   History of Present Illness: Pamela Walters is a 60 y.o. female history of osteoarthritis and degenerative disc disease.  She states she fell about 2 weeks ago and landed on her left hip and both hands.  She states she had x-ray of her left hip which was unremarkable.  She continues to have some discomfort in her hands.  She had good response to cortisone injection to her knee joints in March 2022.  The pain has returned now.  She states she would not be having total knee replacement in the near future.  She continues to have some discomfort in her hands and lower back.  She has symptoms of carpal tunnel syndrome and she has been wearing braces at night.  Her left lateral condyle-itis has improved.  She also came to discuss DEXA results today.  Activities of Daily Living:  Patient reports morning stiffness for 30-60 minutes.   Patient Reports nocturnal pain.  Difficulty dressing/grooming: Reports Difficulty climbing stairs: Reports Difficulty getting out of chair: Reports Difficulty using hands for taps, buttons, cutlery, and/or writing: Reports  Review of Systems  Constitutional:  Positive for fatigue.  HENT:  Positive for mouth dryness. Negative for mouth sores and nose dryness.   Eyes:  Negative for pain, itching and dryness.  Respiratory:  Negative for shortness of breath and difficulty breathing.   Cardiovascular:  Negative for chest pain and palpitations.  Gastrointestinal:  Negative for blood in stool, constipation and diarrhea.  Endocrine: Negative for increased urination.  Genitourinary:  Negative for difficulty urinating.  Musculoskeletal:  Positive for joint pain, joint pain and morning stiffness. Negative  for joint swelling, myalgias, muscle tenderness and myalgias.  Skin:  Negative for color change, rash and redness.  Allergic/Immunologic: Negative for susceptible to infections.  Neurological:  Positive for headaches and weakness. Negative for dizziness, numbness and memory loss.  Hematological:  Positive for bruising/bleeding tendency.  Psychiatric/Behavioral:  Negative for confusion.    PMFS History:  Patient Active Problem List   Diagnosis Date Noted   New daily persistent headache (ndph) 06/16/2020   Head trauma, sequela 06/16/2020   Head trauma 06/10/2020   Hemorrhoids 06/05/2020   S/P hysterectomy 06/05/2020   Other headache syndrome 09/13/2019   Fidgeting 09/13/2019   Dysphagia 06/24/2019   History of 2019 novel coronavirus disease (COVID-19) 06/20/2019   Knee pain 01/30/2019   Recurrent cold sores 03/11/2018   Dyspepsia 03/07/2018   History of multiple sclerosis ([place]) 02/11/2018   Neck pain 02/11/2018   History of Bell's palsy 02/11/2018   Abnormal brain MRI 05/19/2017   Carpal tunnel syndrome, bilateral 05/19/2017   Abnormal stress test    Allergic rhinitis 06/20/2015   Dysesthesia 03/04/2015   OSA (obstructive sleep apnea) 10/01/2014   Increased homocysteine 10/01/2014   Urinary incontinence 05/30/2014   Back pain with radiation 02/05/2013   DISORDER OF BONE AND CARTILAGE UNSPECIFIED 12/16/2009   Overweight (BMI 25.0-29.9) 04/08/2008   Depression with anxiety 12/18/2007   Headache 12/18/2007   Hyperlipidemia LDL goal <100 12/16/2007   Essential hypertension 12/16/2007    Past Medical  History:  Diagnosis Date   Allergy    Anemia    Anxiety    Arthritis    Back pain    Bell's palsy 08/2017   Depression    Depression    Phreesia 05/19/2020   GERD (gastroesophageal reflux disease)    Hepatic steatosis    Hiatal hernia    History of Bell's palsy 02/11/2018   Hyperlipidemia    Hypertension    Internal hemorrhoids    MS (multiple sclerosis) ([place])    2007    Multiple sclerosis ([place])    Multiple sclerosis ([place]) 04/04/2008   Qualifier: Diagnosis of  By: Moshe Cipro MD, Margaret     Schatzki's ring    Sleep apnea    wears CPAP    Family History  Problem Relation Age of Onset   Hypertension Mother    Hyperlipidemia Mother    Depression Mother    Hypertension Father    Hypertension Sister    High Cholesterol Sister    Healthy Son    Healthy Daughter    Colon cancer Neg Hx    Esophageal cancer Neg Hx    Rectal cancer Neg Hx    Stomach cancer Neg Hx    Colon polyps Neg Hx    Past Surgical History:  Procedure Laterality Date   ABDOMINAL HYSTERECTOMY  2005   fibroids   CARDIAC CATHETERIZATION N/A 05/04/2016   Procedure: Left Heart Cath and Coronary Angiography;  Surgeon: Jettie Booze, MD;  Location: [facility];  Service: Cardiovascular;  Laterality: N/A;   COLONOSCOPY     ESOPHAGOGASTRODUODENOSCOPY N/A 09/18/2013   Procedure: ESOPHAGOGASTRODUODENOSCOPY (EGD);  Surgeon: Jerene Bears, MD;  Location: [facility];  Service: Gastroenterology;  Laterality: N/A;   UPPER GASTROINTESTINAL ENDOSCOPY     WISDOM TOOTH EXTRACTION     Social History   Social History Narrative   Not on file   Immunization History  Administered Date(s) Administered   Influenza Split 01/26/2014   Influenza,inj,Quad PF,6+ Mos 02/06/2020   PFIZER(Purple Top)SARS-COV-2 Vaccination 08/12/2019, 09/05/2019, 06/05/2020   Tdap 03/17/2013   Zoster Recombinat (Shingrix) 10/20/2018, 01/30/2019     Objective: Vital Signs: BP 139/82 (BP Location: Left Arm, Patient Position: Sitting, Cuff Size: Normal)   Pulse 75   Ht '5\' 3"'$  (1.6 m)   Wt 172 lb (78 kg)   BMI 30.47 kg/m    Physical Exam Vitals and nursing note reviewed.  Constitutional:      Appearance: She is well-developed.  HENT:     Head: Normocephalic and atraumatic.  Eyes:     Conjunctiva/sclera: Conjunctivae normal.  Cardiovascular:     Rate and Rhythm: Normal rate and regular rhythm.     Heart  sounds: Normal heart sounds.  Pulmonary:     Effort: Pulmonary effort is normal.     Breath sounds: Normal breath sounds.  Abdominal:     General: Bowel sounds are normal.     Palpations: Abdomen is soft.  Musculoskeletal:     Cervical back: Normal range of motion.  Lymphadenopathy:     Cervical: No cervical adenopathy.  Skin:    General: Skin is warm and dry.     Capillary Refill: Capillary refill takes less than 2 seconds.  Neurological:     Mental Status: She is alert and oriented to person, place, and time.  Psychiatric:        Behavior: Behavior normal.     Musculoskeletal Exam: C-spine was in good range of motion.  She had  painful limited range of motion of her lumbar spine.  Shoulder joints, elbow joints, wrist joints with good range of motion.  She had bilateral PIP and DIP thickening with no synovitis.  She had good range of motion of her hip joints.  She had painful range of motion of bilateral knee joints without any warmth swelling or effusion.  There was no tenderness over ankles or MTP joints.  CDAI Exam: CDAI Score: -- Patient Global: --; Provider Global: -- Swollen: --; Tender: -- Joint Exam 12/09/2020   No joint exam has been documented for this visit   There is currently no information documented on the homunculus. Go to the Rheumatology activity and complete the homunculus joint exam.  Investigation: No additional findings.  Imaging: No results found.  Recent Labs: Lab Results  Component Value Date   WBC 6.9 02/06/2020   HGB 13.1 02/06/2020   PLT 363 02/06/2020   NA 141 06/05/2020   K 3.8 06/05/2020   CL 96 06/05/2020   CO2 28 06/05/2020   GLUCOSE 81 06/05/2020   BUN 13 06/05/2020   CREATININE 1.06 (H) 06/05/2020   BILITOT 0.3 06/05/2020   ALKPHOS 111 06/05/2020   AST 22 06/05/2020   ALT 12 06/05/2020   PROT 7.3 06/05/2020   ALBUMIN 4.8 06/05/2020   CALCIUM 9.8 06/05/2020   GFRAA 65 06/05/2020    Speciality Comments: No specialty  comments available.  Procedures:  Large Joint Inj: bilateral knee on 12/09/2020 11:49 AM Indications: pain Details: 27 G 1.5 in needle, medial approach  Arthrogram: No  Medications (Right): 1.5 mL lidocaine 1 %; 40 mg triamcinolone acetonide 40 MG/ML Medications (Left): 1.5 mL lidocaine 1 %; 40 mg triamcinolone acetonide 40 MG/ML Outcome: tolerated well, no immediate complications Procedure, treatment alternatives, risks and benefits explained, specific risks discussed. Consent was given by the patient. Immediately prior to procedure a time out was called to verify the correct patient, procedure, equipment, support staff and site/side marked as required. Patient was prepped and draped in the usual sterile fashion.    Allergies: Patient has no known allergies.   Assessment / Plan:     Visit Diagnoses: Primary osteoarthritis of both hands - Clinical and radiographic findings were consistent with bilateral severe osteoarthritis.  Joint protection muscle strengthening was discussed.  A handout on hand exercises was given.  Carpal tunnel syndrome, bilateral-she uses braces at nighttime.  Lateral epicondylitis, left elbow-improved.  Primary osteoarthritis of both knees -  bilateral severe osteoarthritis and severe chondromalacia patella.  We had detailed discussion regarding the x-ray findings.  She understands that she has severe osteoarthritis in her knee joints.  She is unable to undergo total knee replacement at this time.  She had fairly good response to the cortisone injections to her knee joints in March.  She is requesting repeat cortisone injection.  Per her request bilateral knee joints were prepped in sterile fashion after side effects were discussed bilateral knee joints were injected with cortisone.  She tolerated the procedure well.  Postprocedure instructions were given.  Patient is planning to go for a [place] trip in December.  She would like to have cortisone injections in December  prior to her trip.  We also discussed possible [place] supplement injections in the future.  A handout on knee joint exercises was given.  DDD (degenerative disc disease), lumbar - Multilevel spondylosis and L4-L5 spondylolisthesis with facet joint arthropathy.  She is followed by Dr. Rolena Infante and gets injections by Dr. Nelva Bush.  A handout on  back exercises was given.  Osteopenia of multiple sites - DEXA 08/27/2020 DualFemur Total Right 08/27/2020 61.9 Osteopenia -1.3 0.841 g/cm2.  DEXA findings were discussed with the patient.  Use of resistive exercises, calcium and vitamin D was discussed.  Vitamin D deficiency-she has history of vitamin D deficiency.  She states she takes over-the-counter vitamin D.  Have advised her to increase vitamin D dose to 2000 units daily.  We will check vitamin D at the follow-up visit.  Essential hypertension-blood pressure is well controlled.  Other medical problems are listed as follows:  Pharyngoesophageal dysphagia  Hyperlipidemia LDL goal <100  Depression with anxiety  History of Bell's palsy  Increased homocysteine  OSA (obstructive sleep apnea)  History of 2019 novel coronavirus disease (COVID-19) - January 2021  Orders: Orders Placed This Encounter  Procedures   Large Joint Inj    No orders of the defined types were placed in this encounter.    Follow-Up Instructions: Return in about 4 months (around 04/10/2021) for Osteoarthritis, osteopenia.   Bo Merino, MD  Note - This record has been created using Editor, commissioning.  Chart creation errors have been sought, but may not always  have been located. Such creation errors do not reflect on  the standard of medical care.

## 2020-11-27 ENCOUNTER — Ambulatory Visit (HOSPITAL_COMMUNITY): Payer: BC Managed Care – PPO | Admitting: Psychiatry

## 2020-12-03 ENCOUNTER — Ambulatory Visit (HOSPITAL_COMMUNITY): Payer: BC Managed Care – PPO | Admitting: Psychiatry

## 2020-12-04 ENCOUNTER — Other Ambulatory Visit: Payer: Self-pay | Admitting: Family Medicine

## 2020-12-09 ENCOUNTER — Ambulatory Visit: Payer: BC Managed Care – PPO | Admitting: Rheumatology

## 2020-12-09 ENCOUNTER — Encounter: Payer: Self-pay | Admitting: Rheumatology

## 2020-12-09 ENCOUNTER — Other Ambulatory Visit: Payer: Self-pay

## 2020-12-09 VITALS — BP 139/82 | HR 75 | Ht 63.0 in | Wt 172.0 lb

## 2020-12-09 DIAGNOSIS — M5136 Other intervertebral disc degeneration, lumbar region: Secondary | ICD-10-CM

## 2020-12-09 DIAGNOSIS — M170 Bilateral primary osteoarthritis of knee: Secondary | ICD-10-CM | POA: Diagnosis not present

## 2020-12-09 DIAGNOSIS — Z8616 Personal history of COVID-19: Secondary | ICD-10-CM

## 2020-12-09 DIAGNOSIS — R7989 Other specified abnormal findings of blood chemistry: Secondary | ICD-10-CM

## 2020-12-09 DIAGNOSIS — R1314 Dysphagia, pharyngoesophageal phase: Secondary | ICD-10-CM

## 2020-12-09 DIAGNOSIS — M7712 Lateral epicondylitis, left elbow: Secondary | ICD-10-CM | POA: Diagnosis not present

## 2020-12-09 DIAGNOSIS — E559 Vitamin D deficiency, unspecified: Secondary | ICD-10-CM

## 2020-12-09 DIAGNOSIS — G5603 Carpal tunnel syndrome, bilateral upper limbs: Secondary | ICD-10-CM

## 2020-12-09 DIAGNOSIS — M19041 Primary osteoarthritis, right hand: Secondary | ICD-10-CM

## 2020-12-09 DIAGNOSIS — I10 Essential (primary) hypertension: Secondary | ICD-10-CM

## 2020-12-09 DIAGNOSIS — Z8669 Personal history of other diseases of the nervous system and sense organs: Secondary | ICD-10-CM

## 2020-12-09 DIAGNOSIS — Z13820 Encounter for screening for osteoporosis: Secondary | ICD-10-CM

## 2020-12-09 DIAGNOSIS — G4733 Obstructive sleep apnea (adult) (pediatric): Secondary | ICD-10-CM

## 2020-12-09 DIAGNOSIS — M19042 Primary osteoarthritis, left hand: Secondary | ICD-10-CM

## 2020-12-09 DIAGNOSIS — Z780 Asymptomatic menopausal state: Secondary | ICD-10-CM

## 2020-12-09 DIAGNOSIS — M8589 Other specified disorders of bone density and structure, multiple sites: Secondary | ICD-10-CM

## 2020-12-09 DIAGNOSIS — E785 Hyperlipidemia, unspecified: Secondary | ICD-10-CM

## 2020-12-09 DIAGNOSIS — F418 Other specified anxiety disorders: Secondary | ICD-10-CM

## 2020-12-09 MED ORDER — TRIAMCINOLONE ACETONIDE 40 MG/ML IJ SUSP
40.0000 mg | INTRAMUSCULAR | Status: AC | PRN
  Administered 2020-12-09: 40 mg via INTRA_ARTICULAR

## 2020-12-09 MED ORDER — LIDOCAINE HCL 1 % IJ SOLN
1.5000 mL | INTRAMUSCULAR | Status: AC | PRN
  Administered 2020-12-09: 1.5 mL

## 2020-12-09 NOTE — Patient Instructions (Addendum)
Journal for Nurse Practitioners, 15(4), 263-267. Retrieved January 31, 2018 from [email or web address].[email or web address]">  Knee Exercises Ask your health care provider which exercises are safe for you. Do exercises exactly as told by your health care provider and adjust them as directed. It is normal to feel mild stretching, pulling, tightness, or discomfort as you do these exercises. Stop right away if you feel sudden pain or your pain gets worse. Do not begin these exercises until told by your health care provider. Stretching and range-of-motion exercises These exercises warm up your muscles and joints and improve the movement and flexibility of your knee. These exercises also help to relieve pain andswelling. Knee extension, prone Lie on your abdomen (prone position) on a bed. Place your left / right knee just beyond the edge of the surface so your knee is not on the bed. You can put a towel under your left / right thigh just above your kneecap for comfort. Relax your leg muscles and allow gravity to straighten your knee (extension). You should feel a stretch behind your left / right knee. Hold this position for __________ seconds. Scoot up so your knee is supported between repetitions. Repeat __________ times. Complete this exercise __________ times a day. Knee flexion, active  Lie on your back with both legs straight. If this causes back discomfort, bend your left / right knee so your foot is flat on the floor. Slowly slide your left / right heel back toward your buttocks. Stop when you feel a gentle stretch in the front of your knee or thigh (flexion). Hold this position for __________ seconds. Slowly slide your left / right heel back to the starting position. Repeat __________ times. Complete this exercise __________ times a day. Quadriceps stretch, prone  Lie on your abdomen on a firm surface, such as a bed or padded floor. Bend your left / right knee and hold your ankle. If you cannot reach  your ankle or pant leg, loop a belt around your foot and grab the belt instead. Gently pull your heel toward your buttocks. Your knee should not slide out to the side. You should feel a stretch in the front of your thigh and knee (quadriceps). Hold this position for __________ seconds. Repeat __________ times. Complete this exercise __________ times a day. Hamstring, supine Lie on your back (supine position). Loop a belt or towel over the ball of your left / right foot. The ball of your foot is on the walking surface, right under your toes. Straighten your left / right knee and slowly pull on the belt to raise your leg until you feel a gentle stretch behind your knee (hamstring). Do not let your knee bend while you do this. Keep your other leg flat on the floor. Hold this position for __________ seconds. Repeat __________ times. Complete this exercise __________ times a day. Strengthening exercises These exercises build strength and endurance in your knee. Endurance is theability to use your muscles for a long time, even after they get tired. Quadriceps, isometric This exercise stretches the muscles in front of your thigh (quadriceps) without moving your knee joint (isometric). Lie on your back with your left / right leg extended and your other knee bent. Put a rolled towel or small pillow under your knee if told by your health care provider. Slowly tense the muscles in the front of your left / right thigh. You should see your kneecap slide up toward your hip or see increased dimpling just above the knee. This motion will   push the back of the knee toward the floor. For __________ seconds, hold the muscle as tight as you can without increasing your pain. Relax the muscles slowly and completely. Repeat __________ times. Complete this exercise __________ times a day. Straight leg raises This exercise stretches the muscles in front of your thigh (quadriceps) and the muscles that move your hips (hip  flexors). Lie on your back with your left / right leg extended and your other knee bent. Tense the muscles in the front of your left / right thigh. You should see your kneecap slide up or see increased dimpling just above the knee. Your thigh may even shake a bit. Keep these muscles tight as you raise your leg 4-6 inches (10-15 cm) off the floor. Do not let your knee bend. Hold this position for __________ seconds. Keep these muscles tense as you lower your leg. Relax your muscles slowly and completely after each repetition. Repeat __________ times. Complete this exercise __________ times a day. Hamstring, isometric Lie on your back on a firm surface. Bend your left / right knee about __________ degrees. Dig your left / right heel into the surface as if you are trying to pull it toward your buttocks. Tighten the muscles in the back of your thighs (hamstring) to "dig" as hard as you can without increasing any pain. Hold this position for __________ seconds. Release the tension gradually and allow your muscles to relax completely for __________ seconds after each repetition. Repeat __________ times. Complete this exercise __________ times a day. Hamstring curls If told by your health care provider, do this exercise while wearing ankle weights. Begin with __________ lb weights. Then increase the weight by 1 lb (0.5 kg) increments. Do not wear ankle weights that are more than __________ lb. Lie on your abdomen with your legs straight. Bend your left / right knee as far as you can without feeling pain. Keep your hips flat against the floor. Hold this position for __________ seconds. Slowly lower your leg to the starting position. Repeat __________ times. Complete this exercise __________ times a day. Squats This exercise strengthens the muscles in front of your thigh and knee (quadriceps). Stand in front of a table, with your feet and knees pointing straight ahead. You may rest your hands on the  table for balance but not for support. Slowly bend your knees and lower your hips like you are going to sit in a chair. Keep your weight over your heels, not over your toes. Keep your lower legs upright so they are parallel with the table legs. Do not let your hips go lower than your knees. Do not bend lower than told by your health care provider. If your knee pain increases, do not bend as low. Hold the squat position for __________ seconds. Slowly push with your legs to return to standing. Do not use your hands to pull yourself to standing. Repeat __________ times. Complete this exercise __________ times a day. Wall slides This exercise strengthens the muscles in front of your thigh and knee (quadriceps). Lean your back against a smooth wall or door, and walk your feet out 18-24 inches (46-61 cm) from it. Place your feet hip-width apart. Slowly slide down the wall or door until your knees bend __________ degrees. Keep your knees over your heels, not over your toes. Keep your knees in line with your hips. Hold this position for __________ seconds. Repeat __________ times. Complete this exercise __________ times a day. Straight leg raises This exercise   strengthens the muscles that rotate the leg at the hip and move it away from your body (hip abductors). Lie on your side with your left / right leg in the top position. Lie so your head, shoulder, knee, and hip line up. You may bend your bottom knee to help you keep your balance. Roll your hips slightly forward so your hips are stacked directly over each other and your left / right knee is facing forward. Leading with your heel, lift your top leg 4-6 inches (10-15 cm). You should feel the muscles in your outer hip lifting. Do not let your foot drift forward. Do not let your knee roll toward the ceiling. Hold this position for __________ seconds. Slowly return your leg to the starting position. Let your muscles relax completely after each  repetition. Repeat __________ times. Complete this exercise __________ times a day. Straight leg raises This exercise stretches the muscles that move your hips away from the front of the pelvis (hip extensors). Lie on your abdomen on a firm surface. You can put a pillow under your hips if that is more comfortable. Tense the muscles in your buttocks and lift your left / right leg about 4-6 inches (10-15 cm). Keep your knee straight as you lift your leg. Hold this position for __________ seconds. Slowly lower your leg to the starting position. Let your leg relax completely after each repetition. Repeat __________ times. Complete this exercise __________ times a day. This information is not intended to replace advice given to you by your health care provider. Make sure you discuss any questions you have with your healthcare provider. Document Revised: 02/01/2018 Document Reviewed: 02/01/2018 Elsevier Patient Education  2022 [employer] Hand Exercises Hand exercises can be helpful for almost anyone. These exercises can strengthen the hands, improve flexibility and movement, and increase blood flow to the hands. These results can make work and daily tasks easier. Hand exercises can be especially helpful for people who have joint pain from arthritis or have nerve damage from overuse (carpal tunnel syndrome). These exercises can also help people who have injured a hand. Exercises Most of these hand exercises are gentle stretching and motion exercises. It is usually safe to do them often throughout the day. Warming up your hands before exercise may help to reduce stiffness. You can do this with gentle massage orby placing your hands in warm water for 10-15 minutes. It is normal to feel some stretching, pulling, tightness, or mild discomfort as you begin new exercises. This will gradually improve. Stop an exercise right away if you feel sudden, severe pain or your pain gets worse. Ask your healthcare  provider which exercises are best for you. Knuckle bend or "claw" fist Stand or sit with your arm, hand, and all five fingers pointed straight up. Make sure to keep your wrist straight during the exercise. Gently bend your fingers down toward your palm until the tips of your fingers are touching the top of your palm. Keep your big knuckle straight and just bend the small knuckles in your fingers. Hold this position for __________ seconds. Straighten (extend) your fingers back to the starting position. Repeat this exercise 5-10 times with each hand. Full finger fist Stand or sit with your arm, hand, and all five fingers pointed straight up. Make sure to keep your wrist straight during the exercise. Gently bend your fingers into your palm until the tips of your fingers are touching the middle of your palm. Hold this position for __________ seconds.   Extend your fingers back to the starting position, stretching every joint fully. Repeat this exercise 5-10 times with each hand. Straight fist Stand or sit with your arm, hand, and all five fingers pointed straight up. Make sure to keep your wrist straight during the exercise. Gently bend your fingers at the big knuckle, where your fingers meet your hand, and the middle knuckle. Keep the knuckle at the tips of your fingers straight and try to touch the bottom of your palm. Hold this position for __________ seconds. Extend your fingers back to the starting position, stretching every joint fully. Repeat this exercise 5-10 times with each hand. Tabletop Stand or sit with your arm, hand, and all five fingers pointed straight up. Make sure to keep your wrist straight during the exercise. Gently bend your fingers at the big knuckle, where your fingers meet your hand, as far down as you can while keeping the small knuckles in your fingers straight. Think of forming a tabletop with your fingers. Hold this position for __________ seconds. Extend your fingers  back to the starting position, stretching every joint fully. Repeat this exercise 5-10 times with each hand. Finger spread Place your hand flat on a table with your palm facing down. Make sure your wrist stays straight as you do this exercise. Spread your fingers and thumb apart from each other as far as you can until you feel a gentle stretch. Hold this position for __________ seconds. Bring your fingers and thumb tight together again. Hold this position for __________ seconds. Repeat this exercise 5-10 times with each hand. Making circles Stand or sit with your arm, hand, and all five fingers pointed straight up. Make sure to keep your wrist straight during the exercise. Make a circle by touching the tip of your thumb to the tip of your index finger. Hold for __________ seconds. Then open your hand wide. Repeat this motion with your thumb and each finger on your hand. Repeat this exercise 5-10 times with each hand. Thumb motion Sit with your forearm resting on a table and your wrist straight. Your thumb should be facing up toward the ceiling. Keep your fingers relaxed as you move your thumb. Lift your thumb up as high as you can toward the ceiling. Hold for __________ seconds. Bend your thumb across your palm as far as you can, reaching the tip of your thumb for the small finger (pinkie) side of your palm. Hold for __________ seconds. Repeat this exercise 5-10 times with each hand. Grip strengthening  Hold a stress ball or other soft ball in the middle of your hand. Slowly increase the pressure, squeezing the ball as much as you can without causing pain. Think of bringing the tips of your fingers into the middle of your palm. All of your finger joints should bend when doing this exercise. Hold your squeeze for __________ seconds, then relax. Repeat this exercise 5-10 times with each hand. Contact a health care provider if: Your hand pain or discomfort gets much worse when you do an  exercise. Your hand pain or discomfort does not improve within 2 hours after you exercise. If you have any of these problems, stop doing these exercises right away. Do not do them again unless your health care provider says that you can. Get help right away if: You develop sudden, severe hand pain or swelling. If this happens, stop doing these exercises right away. Do not do them again unless your health care provider says that you can. This   information is not intended to replace advice given to you by your health care provider. Make sure you discuss any questions you have with your healthcare provider. Document Revised: 08/04/2018 Document Reviewed: 04/14/2018 Elsevier Patient Education  [place]. Back Exercises The following exercises strengthen the muscles that help to support the trunk and back. They also help to keep the lower back flexible. Doing these exercises can help to prevent back pain or lessen existing pain. If you have back pain or discomfort, try doing these exercises 2-3 times each day or as told by your health care provider. As your pain improves, do them once each day, but increase the number of times that you repeat the steps for each exercise (do more repetitions). To prevent the recurrence of back pain, continue to do these exercises once each day or as told by your health care provider. Do exercises exactly as told by your health care provider and adjust them as directed. It is normal to feel mild stretching, pulling, tightness, or discomfort as you do these exercises, but you should stop right away if youfeel sudden pain or your pain gets worse. Exercises Single knee to chest Repeat these steps 3-5 times for each leg: Lie on your back on a firm bed or the floor with your legs extended. Bring one knee to your chest. Your other leg should stay extended and in contact with the floor. Hold your knee in place by grabbing your knee or thigh with both hands and  hold. Pull on your knee until you feel a gentle stretch in your lower back or buttocks. Hold the stretch for 10-30 seconds. Slowly release and straighten your leg. Pelvic tilt Repeat these steps 5-10 times: Lie on your back on a firm bed or the floor with your legs extended. Bend your knees so they are pointing toward the ceiling and your feet are flat on the floor. Tighten your lower abdominal muscles to press your lower back against the floor. This motion will tilt your pelvis so your tailbone points up toward the ceiling instead of pointing to your feet or the floor. With gentle tension and even breathing, hold this position for 5-10 seconds. Cat-cow Repeat these steps until your lower back becomes more flexible: Get into a hands-and-knees position on a firm surface. Keep your hands under your shoulders, and keep your knees under your hips. You may place padding under your knees for comfort. Let your head hang down toward your chest. Contract your abdominal muscles and point your tailbone toward the floor so your lower back becomes rounded like the back of a cat. Hold this position for 5 seconds. Slowly lift your head, let your abdominal muscles relax and point your tailbone up toward the ceiling so your back forms a sagging arch like the back of a cow. Hold this position for 5 seconds.  Press-ups Repeat these steps 5-10 times: Lie on your abdomen (face-down) on the floor. Place your palms near your head, about shoulder-width apart. Keeping your back as relaxed as possible and keeping your hips on the floor, slowly straighten your arms to raise the top half of your body and lift your shoulders. Do not use your back muscles to raise your upper torso. You may adjust the placement of your hands to make yourself more comfortable. Hold this position for 5 seconds while you keep your back relaxed. Slowly return to lying flat on the floor.  Bridges Repeat these steps 10 times: Lie on your  back  on a firm surface. Bend your knees so they are pointing toward the ceiling and your feet are flat on the floor. Your arms should be flat at your sides, next to your body. Tighten your buttocks muscles and lift your buttocks off the floor until your waist is at almost the same height as your knees. You should feel the muscles working in your buttocks and the back of your thighs. If you do not feel these muscles, slide your feet 1-2 inches farther away from your buttocks. Hold this position for 3-5 seconds. Slowly lower your hips to the starting position, and allow your buttocks muscles to relax completely. If this exercise is too easy, try doing it with your arms crossed over yourchest. Abdominal crunches Repeat these steps 5-10 times: Lie on your back on a firm bed or the floor with your legs extended. Bend your knees so they are pointing toward the ceiling and your feet are flat on the floor. Cross your arms over your chest. Tip your chin slightly toward your chest without bending your neck. Tighten your abdominal muscles and slowly raise your trunk (torso) high enough to lift your shoulder blades a tiny bit off the floor. Avoid raising your torso higher than that because it can put too much stress on your low back and does not help to strengthen your abdominal muscles. Slowly return to your starting position. Back lifts Repeat these steps 5-10 times: Lie on your abdomen (face-down) with your arms at your sides, and rest your forehead on the floor. Tighten the muscles in your legs and your buttocks. Slowly lift your chest off the floor while you keep your hips pressed to the floor. Keep the back of your head in line with the curve in your back. Your eyes should be looking at the floor. Hold this position for 3-5 seconds. Slowly return to your starting position. Contact a health care provider if: Your back pain or discomfort gets much worse when you do an exercise. Your worsening back  pain or discomfort does not lessen within 2 hours after you exercise. If you have any of these problems, stop doing these exercises right away. Do not do them again unless your health care provider says that you can. Get help right away if: You develop sudden, severe back pain. If this happens, stop doing the exercises right away. Do not do them again unless your health care provider says that you can. This information is not intended to replace advice given to you by your health care provider. Make sure you discuss any questions you have with your healthcare provider. Document Revised: 08/18/2018 Document Reviewed: 01/13/2018 Elsevier Patient Education  [place].

## 2020-12-11 ENCOUNTER — Telehealth: Payer: Self-pay

## 2020-12-11 ENCOUNTER — Encounter: Payer: Self-pay | Admitting: Family Medicine

## 2020-12-11 ENCOUNTER — Other Ambulatory Visit: Payer: Self-pay

## 2020-12-11 ENCOUNTER — Ambulatory Visit: Payer: BC Managed Care – PPO | Admitting: Family Medicine

## 2020-12-11 VITALS — BP 116/75 | HR 85 | Resp 16 | Ht 63.0 in | Wt 170.1 lb

## 2020-12-11 DIAGNOSIS — E559 Vitamin D deficiency, unspecified: Secondary | ICD-10-CM | POA: Diagnosis not present

## 2020-12-11 DIAGNOSIS — R42 Dizziness and giddiness: Secondary | ICD-10-CM | POA: Insufficient documentation

## 2020-12-11 DIAGNOSIS — E785 Hyperlipidemia, unspecified: Secondary | ICD-10-CM

## 2020-12-11 DIAGNOSIS — F418 Other specified anxiety disorders: Secondary | ICD-10-CM

## 2020-12-11 DIAGNOSIS — M25552 Pain in left hip: Secondary | ICD-10-CM | POA: Diagnosis not present

## 2020-12-11 DIAGNOSIS — I10 Essential (primary) hypertension: Secondary | ICD-10-CM | POA: Diagnosis not present

## 2020-12-11 DIAGNOSIS — S0990XS Unspecified injury of head, sequela: Secondary | ICD-10-CM

## 2020-12-11 MED ORDER — MECLIZINE HCL 12.5 MG PO TABS
12.5000 mg | ORAL_TABLET | Freq: Three times a day (TID) | ORAL | 0 refills | Status: DC | PRN
Start: 2020-12-11 — End: 2021-11-01

## 2020-12-11 NOTE — Telephone Encounter (Signed)
FMLA forms received  Copied Noted Sleeved
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
## 2020-12-11 NOTE — Patient Instructions (Addendum)
F/u in sept 20 call if you need me before  Work excuse from 8/15 till re evaluated  Sept 20 when re eval  Nurse please send for d/c summary   Labs today lipid cmp and EGFR and vit D  Please sched mammogram at checkout  You are referred urgently to ortho re left hip and to PT and ENT re vertigo   Need covid booster  Think about what you will eat, plan ahead. Choose " clean, green, fresh or frozen" over canned, processed or packaged foods which are more sugary, salty and fatty. 70 to 75% of food eaten should be vegetables and fruit. Three meals at set times with snacks allowed between meals, but they must be fruit or vegetables. Aim to eat over a 12 hour period , example 7 am to 7 pm, and STOP after  your last meal of the day. Drink water,generally about 64 ounces per day, no other drink is as healthy. Fruit juice is best enjoyed in a healthy way, by EATING the fruit.  Thanks for choosing [facility], we consider it a privelige to serve you.

## 2020-12-11 NOTE — Assessment & Plan Note (Signed)
Fall on 8/1.2022, refer ortho for rehab

## 2020-12-12 ENCOUNTER — Encounter: Payer: Self-pay | Admitting: Family Medicine

## 2020-12-12 ENCOUNTER — Other Ambulatory Visit: Payer: Self-pay

## 2020-12-12 DIAGNOSIS — S0990XS Unspecified injury of head, sequela: Secondary | ICD-10-CM

## 2020-12-12 LAB — CMP14+EGFR
ALT: 15 IU/L (ref 0–32)
AST: 19 IU/L (ref 0–40)
Albumin/Globulin Ratio: 1.8 (ref 1.2–2.2)
Albumin: 4.7 g/dL (ref 3.8–4.8)
Alkaline Phosphatase: 109 IU/L (ref 44–121)
BUN/Creatinine Ratio: 23 (ref 12–28)
BUN: 19 mg/dL (ref 8–27)
Bilirubin Total: 0.3 mg/dL (ref 0.0–1.2)
CO2: 25 mmol/L (ref 20–29)
Calcium: 9.7 mg/dL (ref 8.7–10.3)
Chloride: 99 mmol/L (ref 96–106)
Creatinine, Ser: 0.83 mg/dL (ref 0.57–1.00)
Globulin, Total: 2.6 g/dL (ref 1.5–4.5)
Glucose: 79 mg/dL (ref 65–99)
Potassium: 3.8 mmol/L (ref 3.5–5.2)
Sodium: 140 mmol/L (ref 134–144)
Total Protein: 7.3 g/dL (ref 6.0–8.5)
eGFR: 80 mL/min/{1.73_m2} (ref >59)

## 2020-12-12 LAB — LIPID PANEL
Chol/HDL Ratio: 2.2 ratio (ref 0.0–4.4)
Cholesterol, Total: 142 mg/dL (ref 100–199)
HDL: 64 mg/dL (ref >39)
LDL Chol Calc (NIH): 60 mg/dL (ref 0–99)
Triglycerides: 98 mg/dL (ref 0–149)
VLDL Cholesterol Cal: 18 mg/dL (ref 5–40)

## 2020-12-12 LAB — VITAMIN D 25 HYDROXY (VIT D DEFICIENCY, FRACTURES): Vit D, 25-Hydroxy: 41.4 ng/mL (ref 30.0–100.0)

## 2020-12-12 MED ORDER — TRAMADOL HCL 50 MG PO TABS: ORAL_TABLET | ORAL | 1 refills | Status: DC

## 2020-12-13 DIAGNOSIS — H4720 Unspecified optic atrophy: Secondary | ICD-10-CM | POA: Diagnosis not present

## 2020-12-16 ENCOUNTER — Encounter: Payer: Self-pay | Admitting: Family Medicine

## 2020-12-16 NOTE — Assessment & Plan Note (Signed)
Stable, managed by Psych

## 2020-12-16 NOTE — Assessment & Plan Note (Signed)
Vertigo with change in position, refer PT

## 2020-12-16 NOTE — Assessment & Plan Note (Signed)
DASH diet and commitment to daily physical activity for a minimum of 30 minutes discussed and encouraged, as a part of hypertension management. The importance of attaining a healthy weight is also discussed.  BP/Weight 12/11/2020 12/09/2020 07/23/2020 07/05/2020 06/10/2020 06/05/2020 [id number]  Systolic BP [id number] [id number] Q000111Q  Diastolic BP 75 82 79 81 82 79 90  Wt. (Lbs) 170.12 172 165 167 160 160.5 160  BMI 30.14 30.47 29.23 30.06 28.34 28.89 28.34  Some encounter information is confidential and restricted. Go to Review Flowsheets activity to see all data.   Controlled, no change in medication

## 2020-12-16 NOTE — Assessment & Plan Note (Signed)
Disabling vertigo, triggered by change in position, refer for PT

## 2020-12-16 NOTE — Assessment & Plan Note (Signed)
Hyperlipidemia:Low fat diet discussed and encouraged.   Lipid Panel  Lab Results  Component Value Date   CHOL 142 12/11/2020   HDL 64 12/11/2020   LDLCALC 60 12/11/2020   TRIG 98 12/11/2020   CHOLHDL 2.2 12/11/2020   Controlled, no change in medication

## 2020-12-18 ENCOUNTER — Ambulatory Visit (HOSPITAL_COMMUNITY): Payer: BC Managed Care – PPO | Admitting: Psychiatry

## 2020-12-19 DIAGNOSIS — R42 Dizziness and giddiness: Secondary | ICD-10-CM | POA: Diagnosis not present

## 2020-12-19 DIAGNOSIS — R2689 Other abnormalities of gait and mobility: Secondary | ICD-10-CM | POA: Diagnosis not present

## 2020-12-19 DIAGNOSIS — M542 Cervicalgia: Secondary | ICD-10-CM | POA: Diagnosis not present

## 2020-12-23 ENCOUNTER — Encounter: Payer: Self-pay | Admitting: Family Medicine

## 2020-12-23 DIAGNOSIS — S0003XA Contusion of scalp, initial encounter: Secondary | ICD-10-CM | POA: Insufficient documentation

## 2020-12-31 ENCOUNTER — Telehealth: Payer: Self-pay

## 2020-12-31 NOTE — Telephone Encounter (Signed)
Patient called not heard from ENT.  Referral notes says no longer accepting vertigo patient and Dr Lucia Gaskins is retiring.

## 2021-01-01 ENCOUNTER — Ambulatory Visit (HOSPITAL_COMMUNITY): Payer: BC Managed Care – PPO | Admitting: Psychiatry

## 2021-01-01 DIAGNOSIS — Z0279 Encounter for issue of other medical certificate: Secondary | ICD-10-CM

## 2021-01-03 DIAGNOSIS — R42 Dizziness and giddiness: Secondary | ICD-10-CM | POA: Diagnosis not present

## 2021-01-03 DIAGNOSIS — M542 Cervicalgia: Secondary | ICD-10-CM | POA: Diagnosis not present

## 2021-01-03 DIAGNOSIS — R2689 Other abnormalities of gait and mobility: Secondary | ICD-10-CM | POA: Diagnosis not present

## 2021-01-03 NOTE — Telephone Encounter (Signed)
Completed  Faxed and copy for patient

## 2021-01-06 ENCOUNTER — Other Ambulatory Visit: Payer: Self-pay

## 2021-01-06 ENCOUNTER — Ambulatory Visit (HOSPITAL_COMMUNITY): Payer: BC Managed Care – PPO | Attending: Family Medicine | Admitting: Physical Therapy

## 2021-01-06 ENCOUNTER — Encounter (HOSPITAL_COMMUNITY): Payer: Self-pay | Admitting: Physical Therapy

## 2021-01-06 DIAGNOSIS — R42 Dizziness and giddiness: Secondary | ICD-10-CM | POA: Insufficient documentation

## 2021-01-06 NOTE — Patient Instructions (Signed)
Access Code: [email or web address] URL: [email or web address] Date: 01/06/2021 Prepared by: Josue Hector  Exercises Seated Horizontal Saccades - 3 x daily - 7 x weekly - 3 sets - 10 reps

## 2021-01-06 NOTE — Therapy (Signed)
[place], [place], [address] Phone: [phone]   Fax:  [phone]  Physical Therapy Evaluation  Patient Details  Name: Pamela Walters MRN: [id number] Date of Birth: March 29, 1961 Referring Provider (PT): [name] MD   Encounter Date: 01/06/2021   PT End of Session - 01/06/21 1311     Visit Number 1    Number of Visits 8    Date for PT Re-Evaluation 02/03/21    Authorization Type BCBS COMM PPO (90 VL)    Authorization - Visit Number 1    Authorization - Number of Visits 64    PT Start Time 1115    PT Stop Time 1200    PT Time Calculation (min) 45 min    Activity Tolerance Patient tolerated treatment well    Behavior During Therapy [facility] for tasks assessed/performed             Past Medical History:  Diagnosis Date   Allergy    Anemia    Anxiety    Arthritis    Back pain    Bell's palsy 08/2017   Depression    Depression    Phreesia 05/19/2020   GERD (gastroesophageal reflux disease)    Hepatic steatosis    Hiatal hernia    History of Bell's palsy 02/11/2018   Hyperlipidemia    Hypertension    Internal hemorrhoids    MS (multiple sclerosis) ([place])    2007   Multiple sclerosis ([place])    Multiple sclerosis ([place]) 04/04/2008   Qualifier: Diagnosis of  By: Moshe Cipro MD, Margaret     Schatzki's ring    Sleep apnea    wears CPAP    Past Surgical History:  Procedure Laterality Date   ABDOMINAL HYSTERECTOMY  2005   fibroids   CARDIAC CATHETERIZATION N/A 05/04/2016   Procedure: Left Heart Cath and Coronary Angiography;  Surgeon: Jettie Booze, MD;  Location: [place] [facility];  Service: Cardiovascular;  Laterality: N/A;   COLONOSCOPY     ESOPHAGOGASTRODUODENOSCOPY N/A 09/18/2013   Procedure: ESOPHAGOGASTRODUODENOSCOPY (EGD);  Surgeon: Jerene Bears, MD;  Location: [place];  Service: Gastroenterology;  Laterality: N/A;   UPPER GASTROINTESTINAL ENDOSCOPY     WISDOM TOOTH EXTRACTION       There were no vitals filed for this visit.    Subjective Assessment - 01/06/21 1127     Subjective Patient presents to therapy with complaint of vertigo. She states 6 weeks ago she fell on the stairs while on vacation. She states she hit the back of her head and was unconscious for about 10 minutes. She was worked up at [employer] and eventually DC. She reports no internal damage or bleeding. She has been having issued with vertigo with lying in bed and making quick movements since then. She says this is improving a little. Was prescribed medication for dizziness initially but is no longer taking this. Denies any visual disturbance or ongoing ringing in ears. Did have some ringing initially.    Limitations Standing;Walking;House hold activities    Patient Stated Goals Get back to baseline    Currently in Pain? No/denies                [facility] PT Assessment - 01/06/21 0001       Assessment   Medical Diagnosis Vertigo    Referring Provider (PT) [name] MD    Onset Date/Surgical Date 11/25/20      Precautions   Precautions Fall  Restrictions   Weight Bearing Restrictions No      Balance Screen   Has the patient fallen in the past 6 months Yes    How many times? 1    Has the patient had a decrease in activity level because of a fear of falling?  Yes    Is the patient reluctant to leave their home because of a fear of falling?  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[employer]     Cognition   Overall Cognitive Status Within Functional Limits for tasks assessed      Observation/Other Assessments   Focus on Therapeutic Outcomes (FOTO)  46% function                    Vestibular Assessment - 01/06/21 0001       Symptom Behavior   Type of Dizziness  Spinning    Frequency of Dizziness daily    Duration of Dizziness  <15 seconds    Symptom Nature Motion provoked;Positional    Aggravating Factors Lying supine;Turning head quickly;Sitting with head tilted back;Forward bending    Relieving Factors Head stationary    Progression of Symptoms Better      Oculomotor Exam   Ocular ROM Decreased to LT, difficulty tracking to LT    Saccades Poor trajectory;Slow   difficulty tracking to LT     Positional Testing   Dix-Hallpike Dix-Hallpike Right;Dix-Hallpike Left      Dix-Hallpike Right   Dix-Hallpike Right Duration 15 sec    Dix-Hallpike Right Symptoms Other (comment)   increased dizziness but no noted nystagmus     Dix-Hallpike Left   Dix-Hallpike Left Symptoms No nystagmus                Objective measurements completed on examination: See above findings.                PT Education - 01/06/21 1128     Education Details on evaluation findings, POC and HEP    Person(s) Educated Patient    Methods Explanation;Handout    Comprehension Verbalized understanding              PT Short Term Goals - 01/06/21 1316       PT SHORT TERM GOAL #1   Title Patient will be independent with initial HEP and self-management strategies to improve functional outcomes    Time 2    Period Weeks    Status New    Target Date 01/20/21               PT Long Term Goals - 01/06/21 1316       PT LONG TERM GOAL #1   Title Patient will improve FOTO score to predicted value to indicate improvement in functional outcomes    Time 4    Period Weeks    Status New    Target Date 02/03/21      PT LONG TERM GOAL #2   Title Patient will report at least 80% overall improvement in subjective complaint to indicate improvement in ability to perform ADLs.    Time 4    Period Weeks    Status New    Target Date 02/03/21      PT LONG TERM GOAL #3   Title Patient will report no  vertigo with position changes in and out of bed for improved sleep quality, function, QOL    Time 4    Period Weeks     Status New    Target Date 02/03/21                    Plan - 01/06/21 1312     Clinical Impression Statement Patient is a 60 y.o. female who presents to physical therapy with complaint of dizziness and vertigo. Patient demonstrates dizziness with provocative testing, balance deficits and positional intolerance which are negatively impacting patient ability to perform ADLs and functional mobility tasks. Patient will benefit from skilled physical therapy services to address these deficits to improve level of function with ADLs, functional mobility tasks, and reduce risk for falls.    Examination-Activity Limitations Locomotion Level;Transfers;Sleep;Caring for Others;Other;Lift    Examination-Participation Restrictions Occupation;Community Activity;Laundry;Yard Work;Cleaning;Shop    Stability/Clinical Decision Making Stable/Uncomplicated    Clinical Decision Making Low    Rehab Potential Good    PT Frequency 2x / week    PT Duration 4 weeks    PT Treatment/Interventions ADLs/Self Care Home Management;Moist Heat;Traction;Biofeedback;Canalith Repostioning;Parrafin;Therapeutic activities;Patient/family education;Compression bandaging;Energy conservation;Splinting;Taping;Vasopneumatic Device;Joint Manipulations;Spinal Manipulations;Passive range of motion;Dry needling;Manual techniques;Cognitive remediation;Neuromuscular re-education;Balance training;Vestibular;Visual/perceptual remediation/compensation;Iontophoresis '4mg'$ /ml Dexamethasone;DME Instruction;Gait training;Stair training;Ultrasound;Functional mobility training;Fluidtherapy;Electrical Stimulation;Cryotherapy;Contrast Bath;Therapeutic exercise    PT Next Visit Plan Review HEP. Progress visual vestibular program as well as habituation activity as able. Reassess [place] next visit. Assess static balance    PT Home Exercise Plan Eval: Saccades    Consulted and Agree with Plan of Care Patient             Patient will benefit from  skilled therapeutic intervention in order to improve the following deficits and impairments:  Dizziness, Decreased balance, Decreased activity tolerance, Impaired vision/preception  Visit Diagnosis: Dizziness and giddiness     Problem List Patient Active Problem List   Diagnosis Date Noted   Scalp hematoma 12/23/2020   Acute hip pain, left 12/11/2020   Vertigo 12/11/2020   New daily persistent headache (ndph) 06/16/2020   Head trauma, sequela 06/16/2020   Head trauma 06/10/2020   Hemorrhoids 06/05/2020   S/P hysterectomy 06/05/2020   Other headache syndrome 09/13/2019   Fidgeting 09/13/2019   Dysphagia 06/24/2019   History of 2019 novel coronavirus disease (COVID-19) 06/20/2019   Knee pain 01/30/2019   Recurrent cold sores 03/11/2018   Dyspepsia 03/07/2018   History of multiple sclerosis ([place]) 02/11/2018   Neck pain 02/11/2018   History of Bell's palsy 02/11/2018   Abnormal brain MRI 05/19/2017   Carpal tunnel syndrome, bilateral 05/19/2017   Abnormal stress test    Allergic rhinitis 06/20/2015   Dysesthesia 03/04/2015   OSA (obstructive sleep apnea) 10/01/2014   Increased homocysteine 10/01/2014   Urinary incontinence 05/30/2014   Back pain with radiation 02/05/2013   DISORDER OF BONE AND CARTILAGE UNSPECIFIED 12/16/2009   Overweight (BMI 25.0-29.9) 04/08/2008   Depression with anxiety 12/18/2007   Headache 12/18/2007   Hyperlipidemia LDL goal <100 12/16/2007   Essential hypertension 12/16/2007   1:30 PM, 01/06/21 Josue Hector PT DPT  Physical Therapist with [facility]  [phone], [place], [address] Phone: [phone]   Fax:  [phone]  Name: Pamela Walters MRN: [id number] Date of Birth: 04-Sep-1960

## 2021-01-09 ENCOUNTER — Other Ambulatory Visit: Payer: Self-pay

## 2021-01-09 ENCOUNTER — Ambulatory Visit (HOSPITAL_COMMUNITY): Payer: BC Managed Care – PPO | Admitting: Physical Therapy

## 2021-01-09 DIAGNOSIS — R42 Dizziness and giddiness: Secondary | ICD-10-CM | POA: Diagnosis not present

## 2021-01-09 NOTE — Therapy (Signed)
[place], [place], [address] Phone: [phone]   Fax:  [phone]  Physical Therapy Treatment  Patient Details  Name: Pamela Walters MRN: [id number] Date of Birth: 1960/12/22 Referring Provider (PT): [name] MD   Encounter Date: 01/09/2021   PT End of Session - 01/09/21 1045     Visit Number 2    Number of Visits 8    Date for PT Re-Evaluation 02/03/21    Authorization Type BCBS COMM PPO (90 VL)    Authorization - Visit Number 2    Authorization - Number of Visits 48    PT Start Time [id number]    PT Stop Time 1125    PT Time Calculation (min) 40 min    Activity Tolerance Patient tolerated treatment well    Behavior During Therapy [facility] for tasks assessed/performed             Past Medical History:  Diagnosis Date   Allergy    Anemia    Anxiety    Arthritis    Back pain    Bell's palsy 08/2017   Depression    Depression    Phreesia 05/19/2020   GERD (gastroesophageal reflux disease)    Hepatic steatosis    Hiatal hernia    History of Bell's palsy 02/11/2018   Hyperlipidemia    Hypertension    Internal hemorrhoids    MS (multiple sclerosis) ([place])    2007   Multiple sclerosis ([place])    Multiple sclerosis ([place]) 04/04/2008   Qualifier: Diagnosis of  By: Moshe Cipro MD, Margaret     Schatzki's ring    Sleep apnea    wears CPAP    Past Surgical History:  Procedure Laterality Date   ABDOMINAL HYSTERECTOMY  2005   fibroids   CARDIAC CATHETERIZATION N/A 05/04/2016   Procedure: Left Heart Cath and Coronary Angiography;  Surgeon: Jettie Booze, MD;  Location: [place] [facility];  Service: Cardiovascular;  Laterality: N/A;   COLONOSCOPY     ESOPHAGOGASTRODUODENOSCOPY N/A 09/18/2013   Procedure: ESOPHAGOGASTRODUODENOSCOPY (EGD);  Surgeon: Jerene Bears, MD;  Location: [place];  Service: Gastroenterology;  Laterality: N/A;   UPPER GASTROINTESTINAL ENDOSCOPY     WISDOM TOOTH EXTRACTION       There were no vitals filed for this visit.   Subjective Assessment - 01/09/21 1054     Subjective Pt states that she has been having a slight headache since she was in last.    Pertinent History Fell and hit her head    Limitations Standing;Walking;House hold activities    Currently in Pain? Yes    Pain Score 2     Pain Location Head    Pain Orientation Posterior    Pain Descriptors / Indicators Aching    Pain Type Acute pain    Pain Radiating Towards to ear level    Pain Onset In the past 7 days    Pain Frequency Constant    Pain Relieving Factors just aching taking ibuprofen    Effect of Pain on Daily Activities continue s                     Vestibular Assessment - 01/09/21 0001       Positional Testing   Horizontal Canal Testing Horizontal Canal Right;Horizontal Canal Left      Dix-Hallpike Right   Dix-Hallpike Right Symptoms No nystagmus      Dix-Hallpike Left   Dix-Hallpike Left Symptoms  No nystagmus   Pt felt slight swoosh feeling     Horizontal Canal Right   Horizontal Canal Right Symptoms Normal      Horizontal Canal Left   Horizontal Canal Left Symptoms Normal                      [facility] Adult PT Treatment/Exercise - 01/09/21 0001       Exercises   Exercises Neck      Neck Exercises: Supine   Cervical Isometrics Right lateral flexion;Left lateral flexion;Extension;3 secs;5 reps      Manual Therapy   Manual Therapy Soft tissue mobilization    Manual therapy comments sone seperate from all other aspects of treatment    Soft tissue mobilization to decrease tension and pain             Vestibular Treatment/Exercise - 01/09/21 0001       Vestibular Treatment/Exercise   Vestibular Treatment Provided Gaze    Gaze Exercises Eye/Head Exercise Horizontal;Eye/Head Exercise Vertical      Eye/Head Exercise Horizontal   Foot Position floor    Reps 2    Comments sacaddes as well as smooth pursuits      Eye/Head Exercise  Vertical   Foot Position floor    Reps 5    Comments sacaddes as well as smooth pursuits.                Balance Exercises - 01/09/21 0001       Balance Exercises: Standing   SLS Eyes open;Solid surface;5 reps   Rt: 0, LT 5"                 PT Short Term Goals - 01/09/21 1053       PT SHORT TERM GOAL #1   Title Patient will be independent with initial HEP and self-management strategies to improve functional outcomes    Time 2    Period Weeks    Status On-going    Target Date 01/20/21               PT Long Term Goals - 01/09/21 1053       PT LONG TERM GOAL #1   Title Patient will improve FOTO score to predicted value to indicate improvement in functional outcomes    Time 4    Period Weeks    Status On-going      PT LONG TERM GOAL #2   Title Patient will report at least 80% overall improvement in subjective complaint to indicate improvement in ability to perform ADLs.    Time 4    Period Weeks    Status On-going      PT LONG TERM GOAL #3   Title Patient will report no vertigo with position changes in and out of bed for improved sleep quality, function, QOL    Time 4    Period Weeks    Status On-going                   Plan - 01/09/21 1046     Clinical Impression Statement Evaluation and goals reviewed with pt.  DIx-halePike  manuever completed with negative results. horizontal canal tested with negative results.  PT progressed with eye exercises, started balance activity and manual to assist in decreasing headaches.    Examination-Activity Limitations Locomotion Level;Transfers;Sleep;Caring for Others;Other;Lift    Examination-Participation Restrictions Occupation;Community Activity;Laundry;Yard Work;Cleaning;Shop    Stability/Clinical Decision Making Stable/Uncomplicated    Rehab Potential Good  PT Frequency 2x / week    PT Duration 4 weeks    PT Treatment/Interventions ADLs/Self Care Home Management;Moist  Heat;Traction;Biofeedback;Canalith Repostioning;Parrafin;Therapeutic activities;Patient/family education;Compression bandaging;Energy conservation;Splinting;Taping;Vasopneumatic Device;Joint Manipulations;Spinal Manipulations;Passive range of motion;Dry needling;Manual techniques;Cognitive remediation;Neuromuscular re-education;Balance training;Vestibular;Visual/perceptual remediation/compensation;Iontophoresis '4mg'$ /ml Dexamethasone;DME Instruction;Gait training;Stair training;Ultrasound;Functional mobility training;Fluidtherapy;Electrical Stimulation;Cryotherapy;Contrast Bath;Therapeutic exercise    PT Next Visit Plan . Progress visual vestibular program as well as habituation , manual and balance activity as able.    PT Home Exercise Plan Eval: Saccades; 9/15: smooth pursuits ,  cervical isometrics.    Consulted and Agree with Plan of Care Patient             Patient will benefit from skilled therapeutic intervention in order to improve the following deficits and impairments:  Dizziness, Decreased balance, Decreased activity tolerance, Impaired vision/preception  Visit Diagnosis: Dizziness and giddiness     Problem List Patient Active Problem List   Diagnosis Date Noted   Scalp hematoma 12/23/2020   Acute hip pain, left 12/11/2020   Vertigo 12/11/2020   New daily persistent headache (ndph) 06/16/2020   Head trauma, sequela 06/16/2020   Head trauma 06/10/2020   Hemorrhoids 06/05/2020   S/P hysterectomy 06/05/2020   Other headache syndrome 09/13/2019   Fidgeting 09/13/2019   Dysphagia 06/24/2019   History of 2019 novel coronavirus disease (COVID-19) 06/20/2019   Knee pain 01/30/2019   Recurrent cold sores 03/11/2018   Dyspepsia 03/07/2018   History of multiple sclerosis ([place]) 02/11/2018   Neck pain 02/11/2018   History of Bell's palsy 02/11/2018   Abnormal brain MRI 05/19/2017   Carpal tunnel syndrome, bilateral 05/19/2017   Abnormal stress test    Allergic rhinitis  06/20/2015   Dysesthesia 03/04/2015   OSA (obstructive sleep apnea) 10/01/2014   Increased homocysteine 10/01/2014   Urinary incontinence 05/30/2014   Back pain with radiation 02/05/2013   DISORDER OF BONE AND CARTILAGE UNSPECIFIED 12/16/2009   Overweight (BMI 25.0-29.9) 04/08/2008   Depression with anxiety 12/18/2007   Headache 12/18/2007   Hyperlipidemia LDL goal <100 12/16/2007   Essential hypertension 12/16/2007   Rayetta Humphrey, PT CLT [phone]  01/09/2021, 11:34 AM  [place], [place], [address] Phone: [phone]   Fax:  [phone]  Name: Pamela Walters MRN: [id number] Date of Birth: Jun 14, 1960

## 2021-01-10 ENCOUNTER — Ambulatory Visit: Payer: BC Managed Care – PPO | Admitting: Family Medicine

## 2021-01-10 ENCOUNTER — Encounter: Payer: Self-pay | Admitting: Family Medicine

## 2021-01-10 ENCOUNTER — Ambulatory Visit (HOSPITAL_COMMUNITY)
Admission: RE | Admit: 2021-01-10 | Discharge: 2021-01-10 | Disposition: A | Discharge status: Home / Self Care | Payer: BC Managed Care – PPO | Source: Ambulatory Visit | Attending: Family Medicine | Admitting: Family Medicine

## 2021-01-10 VITALS — BP 118/76 | HR 95 | Resp 16 | Ht 63.0 in | Wt 171.0 lb

## 2021-01-10 DIAGNOSIS — Z23 Encounter for immunization: Secondary | ICD-10-CM | POA: Diagnosis not present

## 2021-01-10 DIAGNOSIS — M542 Cervicalgia: Secondary | ICD-10-CM | POA: Diagnosis not present

## 2021-01-10 DIAGNOSIS — G4452 New daily persistent headache (NDPH): Secondary | ICD-10-CM | POA: Diagnosis not present

## 2021-01-10 DIAGNOSIS — R42 Dizziness and giddiness: Secondary | ICD-10-CM

## 2021-01-10 DIAGNOSIS — F418 Other specified anxiety disorders: Secondary | ICD-10-CM

## 2021-01-10 DIAGNOSIS — S0990XS Unspecified injury of head, sequela: Secondary | ICD-10-CM

## 2021-01-10 DIAGNOSIS — Z1231 Encounter for screening mammogram for malignant neoplasm of breast: Secondary | ICD-10-CM | POA: Diagnosis not present

## 2021-01-10 DIAGNOSIS — R2689 Other abnormalities of gait and mobility: Secondary | ICD-10-CM | POA: Diagnosis not present

## 2021-01-10 DIAGNOSIS — L819 Disorder of pigmentation, unspecified: Secondary | ICD-10-CM

## 2021-01-10 DIAGNOSIS — I10 Essential (primary) hypertension: Secondary | ICD-10-CM

## 2021-01-10 DIAGNOSIS — S0003XD Contusion of scalp, subsequent encounter: Secondary | ICD-10-CM

## 2021-01-10 NOTE — Progress Notes (Signed)
   Pamela Walters     MRN: [id number]      DOB: Jan 24, 1961   HPI [name] is here for follow up disability still having vertigo daily vertigo and imbalance, having problem with depth perception, very afraid of stairs now, this  is new since her fall on 11/25/2020 Reports daily headache since fall, rated from a 3 , nag to a 7, use tylenol 1000 mg every day, and tramadol 1 daily for  headache Reports new open area in posterior scalp where she had trauma Ortho is working hip ROS Denies recent fever or chills. Denies sinus pressure, nasal congestion, ear pain or sore throat. Denies chest congestion, productive cough or wheezing. Denies chest pains, palpitations and leg swelling Denies abdominal pain, nausea, vomiting,diarrhea or constipation.   Denies dysuria, frequency, hesitancy or incontinence.  C/o increased  depression and anxiety . C/o skin lesions generalized but specific one on herb back she wants evaluated, feels as though inc in size and irregularity, spouse concerned  PE  BP 118/76   Pulse 95   Resp 16   Ht '5\' 3"'$  (1.6 m)   Wt 171 lb (77.6 kg)   SpO2 98%   BMI 30.29 kg/m   Patient alert and oriented and in no cardiopulmonary distress.  HEENT: No facial asymmetry, EOMI,     Neck supple .  Chest: Clear to auscultation bilaterally.  CVS: S1, S2 no murmurs, no S3.Regular rate.  ABD: Soft non tender.   Ext: No edema  MS: decreased  ROM spine, adequate in shoulders, decreased in hips and adequate in  knees.  Skin: healing scab on scalp in area where she had head trauma, multiple pigmented skin lesions, varied in pigmentation and  irregular borders  Psych: Good eye contact, normal affect. Memory intact  anxious not depressed appearing.  CNS: CN 2-12 intact, power,  normal throughout.no focal deficits noted.   Assessment & Plan  Head trauma, sequela C/o daily headache, vertigo unchecked and imbalance following head trauma   Head trauma C/o daily headache,  vertigo poor depth perception following recent head trauma, needs Neurology evaluation, extend work excuse until re evaluated in 3 months, will call if symptoms resolve sooner  Essential hypertension Controlled, no change in medication DASH diet and commitment to daily physical activity for a minimum of 30 minutes discussed and encouraged, as a part of hypertension management. The importance of attaining a healthy weight is also discussed.  BP/Weight 01/10/2021 12/11/2020 12/09/2020 07/23/2020 07/05/2020 [id number]  Systolic BP 123456 [id number] Q000111Q XX123456 [id number]  Diastolic BP 76 75 82 79 81 82 79  Wt. (Lbs) 171 170.12 172 165 167 160 160.5  BMI 30.29 30.14 30.47 29.23 30.06 28.34 28.89  Some encounter information is confidential and restricted. Go to Review Flowsheets activity to see all data.       Depression with anxiety Increased with current debility, not suicidal or homicidal managed by Psych  New daily persistent headache (ndph) Reports uncontrolled daily headache since fall in 11/2020, Neurology to eval and manage  Vertigo Daily disabling vertigo, has recently stareted PT, refer Neurology and ENT  Scalp hematoma overling scab healing slowly, advised no truma to area as in brushing the scalp and observe, no sign of infection, hematoma resolved  Change in multiple pigmented skin lesions Refer derm for eval

## 2021-01-10 NOTE — Patient Instructions (Addendum)
Follow-up the week of  December 12 for reevaluation, call if you need me before.  Flu vaccine in office today.  Labs in August are excellent  Please get covid booster as soon as you are able to  You are referred to neurology regarding your complaints of headache vertigo and imbalance it is important that you go.  You are referred to dermatology regarding skin lesions.  New work excuse to start today until your next appointment in December.  Please do not brush the area where you hit your head, continue to keep it clean and dry, the skin is still healing, no infection in the area  Thanks for choosing [facility], we consider it a privelige to serve you.

## 2021-01-14 ENCOUNTER — Other Ambulatory Visit: Payer: Self-pay

## 2021-01-14 ENCOUNTER — Ambulatory Visit (HOSPITAL_COMMUNITY): Payer: BC Managed Care – PPO | Admitting: Physical Therapy

## 2021-01-14 ENCOUNTER — Encounter (HOSPITAL_COMMUNITY): Payer: Self-pay | Admitting: Physical Therapy

## 2021-01-14 ENCOUNTER — Encounter: Payer: Self-pay | Admitting: Family Medicine

## 2021-01-14 DIAGNOSIS — L819 Disorder of pigmentation, unspecified: Secondary | ICD-10-CM | POA: Insufficient documentation

## 2021-01-14 DIAGNOSIS — R42 Dizziness and giddiness: Secondary | ICD-10-CM

## 2021-01-14 NOTE — Assessment & Plan Note (Signed)
Daily disabling vertigo, has recently stareted PT, refer Neurology and ENT

## 2021-01-14 NOTE — Assessment & Plan Note (Signed)
Reports uncontrolled daily headache since fall in 11/2020, Neurology to eval and manage

## 2021-01-14 NOTE — Assessment & Plan Note (Signed)
Increased with current debility, not suicidal or homicidal managed by Psych

## 2021-01-14 NOTE — Assessment & Plan Note (Signed)
Controlled, no change in medication DASH diet and commitment to daily physical activity for a minimum of 30 minutes discussed and encouraged, as a part of hypertension management. The importance of attaining a healthy weight is also discussed.  BP/Weight 01/10/2021 12/11/2020 12/09/2020 07/23/2020 07/05/2020 05/27/8655 11/28/6960  Systolic BP 952 841 324 401 027 253 664  Diastolic BP 76 75 82 79 81 82 79  Wt. (Lbs) 171 170.12 172 165 167 160 160.5  BMI 30.29 30.14 30.47 29.23 30.06 28.34 28.89  Some encounter information is confidential and restricted. Go to Review Flowsheets activity to see all data.

## 2021-01-14 NOTE — Assessment & Plan Note (Signed)
C/o daily headache, vertigo unchecked and imbalance following head trauma

## 2021-01-14 NOTE — Assessment & Plan Note (Signed)
C/o daily headache, vertigo poor depth perception following recent head trauma, needs Neurology evaluation, extend work excuse until re evaluated in 3 months, will call if symptoms resolve sooner

## 2021-01-14 NOTE — Assessment & Plan Note (Addendum)
overling scab healing slowly, advised no truma to area as in brushing the scalp and observe, no sign of infection, hematoma resolved

## 2021-01-14 NOTE — Assessment & Plan Note (Signed)
Refer derm for eval

## 2021-01-15 ENCOUNTER — Ambulatory Visit (INDEPENDENT_AMBULATORY_CARE_PROVIDER_SITE_OTHER): Payer: BC Managed Care – PPO | Admitting: Psychiatry

## 2021-01-15 ENCOUNTER — Ambulatory Visit: Payer: BC Managed Care – PPO | Admitting: Family Medicine

## 2021-01-15 ENCOUNTER — Encounter (HOSPITAL_COMMUNITY): Payer: BC Managed Care – PPO | Admitting: Physical Therapy

## 2021-01-15 DIAGNOSIS — F331 Major depressive disorder, recurrent, moderate: Secondary | ICD-10-CM

## 2021-01-15 NOTE — Therapy (Signed)
[place], [place], [address] Phone: [phone]   Fax:  [phone]  Physical Therapy Treatment  Patient Details  Name: Pamela Walters MRN: [id number] Date of Birth: [birth year] Referring Provider (PT): [name] MD   Encounter Date: 01/14/2021   PT End of Session - 01/14/21 1708     Visit Number 3    Number of Visits 8    Date for PT Re-Evaluation 02/03/21    Authorization Type BCBS COMM PPO (90 VL)    Authorization - Visit Number 3    Authorization - Number of Visits 55    PT Start Time 7989    PT Stop Time 2119    PT Time Calculation (min) 41 min    Activity Tolerance Patient tolerated treatment well    Behavior During Therapy [facility] for tasks assessed/performed             Past Medical History:  Diagnosis Date   Allergy    Anemia    Anxiety    Arthritis    Back pain    Bell's palsy 08/2017   Depression    Depression    Phreesia 05/19/2020   GERD (gastroesophageal reflux disease)    Hepatic steatosis    Hiatal hernia    History of Bell's palsy 02/11/2018   Hyperlipidemia    Hypertension    Internal hemorrhoids    MS (multiple sclerosis) ([place])    2007   Multiple sclerosis ([place])    Multiple sclerosis ([place]) 04/04/2008   Qualifier: Diagnosis of  By: Moshe Cipro MD, Margaret     Schatzki's ring    Sleep apnea    wears CPAP    Past Surgical History:  Procedure Laterality Date   ABDOMINAL HYSTERECTOMY  2005   fibroids   CARDIAC CATHETERIZATION N/A 05/04/2016   Procedure: Left Heart Cath and Coronary Angiography;  Surgeon: Jettie Booze, MD;  Location: [place] [facility];  Service: Cardiovascular;  Laterality: N/A;   COLONOSCOPY     ESOPHAGOGASTRODUODENOSCOPY N/A 09/18/2013   Procedure: ESOPHAGOGASTRODUODENOSCOPY (EGD);  Surgeon: Jerene Bears, MD;  Location: [place];  Service: Gastroenterology;  Laterality: N/A;   UPPER GASTROINTESTINAL ENDOSCOPY     WISDOM TOOTH EXTRACTION       There were no vitals filed for this visit.   Subjective Assessment - 01/14/21 1707     Subjective Patient says she is feeling better. Not as dizzy but still having some trouble with her depth perception.    Pertinent History [place] and hit her head    Limitations Standing;Walking;House hold activities    Currently in Pain? Yes    Pain Score 2     Pain Location Neck    Pain Orientation Posterior    Pain Descriptors / Indicators Sore    Pain Onset In the past 7 days                01/14/21 0001  Vestibular Treatment/Exercise  Vestibular Treatment Provided Habituation;Gaze  Habituation Exercises Seated Vertical Head Turns;Seated Horizontal Head Turns (forward leaning 5 x 5" hold)  Gaze Exercises Eye/Head Exercise Horizontal;Eye/Head Exercise Vertical  Seated Horizontal Head Turns  Number of Reps  10  Symptom Description  "head feels heavy"  Seated Vertical Head Turns  Number of Reps  10  Symptom Description  No noted symptoms  Eye/Head Exercise Horizontal  Foot Position floor  Comments saccades and VOR x  Eye/Head Exercise Vertical  Foot Position floor  01/14/21 0001  Neck Exercises: Standing  Other Standing Exercises semi tandem stance 2 x 10" (very unsteady, consistently leans toward LT)  Neck Exercises: Seated  Cervical Isometrics Right lateral flexion;Left lateral flexion;5 secs;5 reps;Extension       PT Short Term Goals - 01/09/21 1053       PT SHORT TERM GOAL #1   Title Patient will be independent with initial HEP and self-management strategies to improve functional outcomes    Time 2    Period Weeks    Status On-going    Target Date 01/20/21               PT Long Term Goals - 01/09/21 1053       PT LONG TERM GOAL #1   Title Patient will improve FOTO score to predicted value to indicate improvement in functional outcomes    Time 4    Period Weeks    Status On-going      PT LONG TERM GOAL #2   Title Patient will report at  least 80% overall improvement in subjective complaint to indicate improvement in ability to perform ADLs.    Time 4    Period Weeks    Status On-going      PT LONG TERM GOAL #3   Title Patient will report no vertigo with position changes in and out of bed for improved sleep quality, function, QOL    Time 4    Period Weeks    Status On-going                   Plan - 01/15/21 8016     Clinical Impression Statement Continued with vestibular habituation exercise. Patient continues to demo mild symptoms with activity, seemingly more pronounced about LT side. Patient with continued difficult with visual tracking exercises. Improved some with verbal cueing to approximate targets, but still showing poor trajectory. Added forward leans for habituation to frontal positional changes. Patient showed somewhat lessening symptoms with practice. Attempted static balance at end of session. Patient very unsteady and loses balance consistently to LT. Will continue to progress visual tracking until ready to retry static balance.    Examination-Activity Limitations Locomotion Level;Transfers;Sleep;Caring for Others;Other;Lift    Examination-Participation Restrictions Occupation;Community Activity;Laundry;Yard Work;Cleaning;Shop    Stability/Clinical Decision Making Stable/Uncomplicated    Rehab Potential Good    PT Frequency 2x / week    PT Duration 4 weeks    PT Treatment/Interventions ADLs/Self Care Home Management;Moist Heat;Traction;Biofeedback;Canalith Repostioning;Parrafin;Therapeutic activities;Patient/family education;Compression bandaging;Energy conservation;Splinting;Taping;Vasopneumatic Device;Joint Manipulations;Spinal Manipulations;Passive range of motion;Dry needling;Manual techniques;Cognitive remediation;Neuromuscular re-education;Balance training;Vestibular;Visual/perceptual remediation/compensation;Iontophoresis 4mg /ml Dexamethasone;DME Instruction;Gait training;Stair  training;Ultrasound;Functional mobility training;Fluidtherapy;Electrical Stimulation;Cryotherapy;Contrast Bath;Therapeutic exercise    PT Next Visit Plan Progress visual vestibular program as well as habituation , manual and balance activity as able.    PT Home Exercise Plan Eval: Saccades; 9/15: smooth pursuits ,  cervical isometrics. 9/20 forward leans (seated)    Consulted and Agree with Plan of Care Patient             Patient will benefit from skilled therapeutic intervention in order to improve the following deficits and impairments:  Dizziness, Decreased balance, Decreased activity tolerance, Impaired vision/preception  Visit Diagnosis: Dizziness and giddiness     Problem List Patient Active Problem List   Diagnosis Date Noted   Change in multiple pigmented skin lesions 01/14/2021   Scalp hematoma 12/23/2020   Acute hip pain, left 12/11/2020   Vertigo 12/11/2020   New daily persistent headache (ndph) 06/16/2020   Head  trauma 06/10/2020   Hemorrhoids 06/05/2020   S/P hysterectomy 06/05/2020   Other headache syndrome 09/13/2019   Fidgeting 09/13/2019   Dysphagia 06/24/2019   History of 2019 novel coronavirus disease (COVID-19) 06/20/2019   Knee pain 01/30/2019   Recurrent cold sores 03/11/2018   Dyspepsia 03/07/2018   History of multiple sclerosis ([place]) 02/11/2018   Neck pain 02/11/2018   History of Bell's palsy 02/11/2018   Abnormal brain MRI 05/19/2017   Carpal tunnel syndrome, bilateral 05/19/2017   Abnormal stress test    Allergic rhinitis 06/20/2015   Dysesthesia 03/04/2015   OSA (obstructive sleep apnea) 10/01/2014   Increased homocysteine 10/01/2014   Urinary incontinence 05/30/2014   Back pain with radiation 02/05/2013   DISORDER OF BONE AND CARTILAGE UNSPECIFIED 12/16/2009   Overweight (BMI 25.0-29.9) 04/08/2008   Depression with anxiety 12/18/2007   Headache 12/18/2007   Hyperlipidemia LDL goal <100 12/16/2007   Essential hypertension 12/16/2007    4:48 PM, 01/15/21 Josue Hector PT DPT  Physical Therapist with [facility]  [phone] [place], [place], [address] Phone: [phone]   Fax:  [phone]  Name: Pamela Walters MRN: [id number] Date of Birth: [birth year]

## 2021-01-15 NOTE — Progress Notes (Signed)
   01/14/21 0001  Vestibular Treatment/Exercise  Vestibular Treatment Provided Habituation;Gaze  Habituation Exercises Seated Vertical Head Turns;Seated Horizontal Head Turns (forward leaning 5 x 5" hold)  Gaze Exercises Eye/Head Exercise Horizontal;Eye/Head Exercise Vertical  Seated Horizontal Head Turns  Number of Reps  10  Symptom Description  "head feels heavy"  Seated Vertical Head Turns  Number of Reps  10  Symptom Description  No noted symptoms  Eye/Head Exercise Horizontal  Foot Position floor  Comments saccades and VOR x  Eye/Head Exercise Vertical  Foot Position floor

## 2021-01-15 NOTE — Progress Notes (Signed)
Virtual Visit via Video Note  I connected with DERIANA VANDERHOEF on 01/15/21 at 9:07 AM EDT by a video enabled telemedicine application and verified that I am speaking with the correct person using two identifiers.  Location: Patient: Home Provider: [place] office    I discussed the limitations of evaluation and management by telemedicine and the availability of in person appointments. The patient expressed understanding and agreed to proceed.   I provided 53 minutes of non-face-to-face time during this encounter.   Alonza Smoker, LCSW                     THERAPIST PROGRESS NOTE             Session Time:  Wednesday 01/15/2021 9:07 AM - 10:00 AM   Participation Level: Active  Behavioral Response: Casual/ alert, anxious, depressed           Type of Therapy: Individual Therapy        Treatment Goals: Elevate mood and show evidence of usual energy, activities, and socialization level , resolve conflicted  feelings and adapt to new life circumstances AEB by patient implementing 3 new activities that increase a level of satisfaction consistently for 3 weeks patient identifying 5 advantages of current life situation                                                 Interventions: Supportive,  CBT  Summary: AKSHITHA CULMER is a 60 y.o. female who is referred for services by PCP Dr. Moshe Cipro due to patient suffering symptoms of depression. Patient has been seen in this office interminttently for medication management and outpatient psychotherapy since 2016. She is resuming services  due to increased stress related to family conflict and her job.  Patient's reports increased irritability, decreased interest in activities, excessive worry, poor concentration, tearfulness, fatigue, restlessness, loss of appetite, and poor motivation.       Patient last was seen by virtual visit via video 2 months ago.  She reports suffering from a concussion she sustained in a fall the  first week of August 2022.  She currently is out on medical leave until December 2022.  Patient reports becoming very depressed for the first 3 weeks after the fall.  However, she reports being less depressed now.  She rates level of depression at a 4 or 5 on a 10 point scale with 10 being severe depression.  She reports improved eating and sleep patterns.  She expresses frustration she is unable to do things like she used to do as she is experiencing vertigo, balance issues, poor concentration, and memory difficulty as a result of the concussion.  She reports trying to accept this and using coping statements. She currently is participating in occupational and physical therapy and has seen improvement in her condition.  She reports strong support from her family and is very pleased her son and grandchildren are initiating frequent contact.  She expresses some disappointment as her daughter has not done this as frequently.  Patient reports feeling much better about the overall family dynamics as there appears to be less conflict in the family and more communication.  They have a group text and they are planning a family trip to [place] for Christmas.  Patient is very excited about this.  She expresses concern that  family dynamics may change once she recovers.   Suicidal/Homicidal: No  Therapist Response:  Reviewed symptoms, discussed stressors, facilitated expression of thoughts and feelings, validated feelings, praised and reinforced patient's improved self-care, assisted patient identify and examined the change in family dynamics, assisted patient identify her thoughts evoking worry about current family dynamics, assisted patient identify her expectations/demands about family dynamics and the effects on her current functioning, began to assist patient identify ways to develop her own individual interests, developed plan with patient to list possible activities for self she may like to pursue in the future    Plan: Return again in 2 weeks.            Diagnosis: Axis I: Major Depressive Disorder, Recurrent, Moderate    Axis II: No diagnosis    Alonza Smoker, LCSW 01/15/2021

## 2021-01-15 NOTE — Progress Notes (Signed)
   01/14/21 0001  Neck Exercises: Standing  Other Standing Exercises semi tandem stance 2 x 10" (very unsteady, consistently leans toward LT)  Neck Exercises: Seated  Cervical Isometrics Right lateral flexion;Left lateral flexion;5 secs;5 reps;Extension

## 2021-01-20 ENCOUNTER — Ambulatory Visit (HOSPITAL_COMMUNITY): Payer: BC Managed Care – PPO | Admitting: Physical Therapy

## 2021-01-20 ENCOUNTER — Other Ambulatory Visit: Payer: Self-pay

## 2021-01-20 ENCOUNTER — Encounter (HOSPITAL_COMMUNITY): Payer: Self-pay | Admitting: Physical Therapy

## 2021-01-20 DIAGNOSIS — R42 Dizziness and giddiness: Secondary | ICD-10-CM

## 2021-01-20 NOTE — Therapy (Signed)
[place], [place], [address] Phone: [phone]   Fax:  [phone]  Physical Therapy Treatment  Patient Details  Name: Pamela Walters MRN: [id number] Date of Birth: 02/05/1961 Referring Provider (PT): [name] MD   Encounter Date: 01/20/2021   PT End of Session - 01/20/21 1400     Visit Number 4    Number of Visits 8    Date for PT Re-Evaluation 02/03/21    Authorization Type BCBS COMM PPO (90 VL)    Authorization - Visit Number 4    Authorization - Number of Visits 90    Activity Tolerance Patient tolerated treatment well    Behavior During Therapy [facility] for tasks assessed/performed             Past Medical History:  Diagnosis Date   Allergy    Anemia    Anxiety    Arthritis    Back pain    Bell's palsy 08/2017   Depression    Depression    Phreesia 05/19/2020   GERD (gastroesophageal reflux disease)    Hepatic steatosis    Hiatal hernia    History of Bell's palsy 02/11/2018   Hyperlipidemia    Hypertension    Internal hemorrhoids    MS (multiple sclerosis) ([place])    2007   Multiple sclerosis ([place])    Multiple sclerosis ([place]) 04/04/2008   Qualifier: Diagnosis of  By: Moshe Cipro MD, Margaret     Schatzki's ring    Sleep apnea    wears CPAP    Past Surgical History:  Procedure Laterality Date   ABDOMINAL HYSTERECTOMY  2005   fibroids   CARDIAC CATHETERIZATION N/A 05/04/2016   Procedure: Left Heart Cath and Coronary Angiography;  Surgeon: Jettie Booze, MD;  Location: [place] [facility];  Service: Cardiovascular;  Laterality: N/A;   COLONOSCOPY     ESOPHAGOGASTRODUODENOSCOPY N/A 09/18/2013   Procedure: ESOPHAGOGASTRODUODENOSCOPY (EGD);  Surgeon: Jerene Bears, MD;  Location: [place];  Service: Gastroenterology;  Laterality: N/A;   UPPER GASTROINTESTINAL ENDOSCOPY     WISDOM TOOTH EXTRACTION      There were no vitals filed for this visit.   Subjective Assessment -  01/20/21 1317     [place]  She did alright going down but was feeling sick coming up.  She would say that her headahe and balance are the worse problems now her vertigo has been better except for yesterday.    Pertinent History [place] and hit her head    Limitations Standing;Walking;House hold activities    Patient Stated Goals Get back to baseline    Currently in Pain? Yes    Pain Score 5     Pain Location Head    Pain Orientation Left;Posterior    Pain Descriptors / Indicators Aching    Pain Type Acute pain    Pain Radiating Towards into the shoulder area    Pain Onset In the past 7 days    Pain Frequency Constant    Aggravating Factors  not susre    Pain Relieving Factors ibuprofen    Effect of Pain on Daily Activities limits                               [facility] Adult PT Treatment/Exercise - 01/20/21 0001       Neck Exercises: Supine  Cervical Isometrics Right lateral flexion;Left lateral flexion;Extension;3 secs;5 reps    Cervical Rotation Both;5 reps    Other Supine Exercise scapular retraction x 10      Manual Therapy   Manual Therapy Soft tissue mobilization;Myofascial release    Manual therapy comments done seperate from all other aspects of treatment    Soft tissue mobilization to decrease tension and pain    Myofascial Release to decrease distal pull of fascia.                 Balance Exercises - 01/20/21 0001       Balance Exercises: Standing   Standing Eyes Opened Narrow base of support (BOS);Head turns;5 reps    Standing Eyes Closed Narrow base of support (BOS);Solid surface    Tandem Stance 2 reps;Eyes open    SLS Eyes open;3 reps                  PT Short Term Goals - 01/09/21 1053       PT SHORT TERM GOAL #1   Title Patient will be independent with initial HEP and self-management strategies to improve functional outcomes    Time 2    Period Weeks    Status  On-going    Target Date 01/20/21               PT Long Term Goals - 01/09/21 1053       PT LONG TERM GOAL #1   Title Patient will improve FOTO score to predicted value to indicate improvement in functional outcomes    Time 4    Period Weeks    Status On-going      PT LONG TERM GOAL #2   Title Patient will report at least 80% overall improvement in subjective complaint to indicate improvement in ability to perform ADLs.    Time 4    Period Weeks    Status On-going      PT LONG TERM GOAL #3   Title Patient will report no vertigo with position changes in and out of bed for improved sleep quality, function, QOL    Time 4    Period Weeks    Status On-going                   Plan - 01/20/21 1316     Clinical Impression Statement PT comes in with a severe headache, states she almost did not come in.   Began treatment with myofascaial release as well as soft tissue massage to decrease headache.  Progressed to static balance activity with posterior left lean when pt loses her balance.  PT able to use core mm to correct when cued.    Examination-Activity Limitations Locomotion Level;Transfers;Sleep;Caring for Others;Other;Lift    Examination-Participation Restrictions Occupation;Community Activity;Laundry;Yard Work;Cleaning;Shop    Stability/Clinical Decision Making Stable/Uncomplicated    Rehab Potential Good    PT Frequency 2x / week    PT Duration 4 weeks    PT Treatment/Interventions ADLs/Self Care Home Management;Moist Heat;Traction;Biofeedback;Canalith Repostioning;Parrafin;Therapeutic activities;Patient/family education;Compression bandaging;Energy conservation;Splinting;Taping;Vasopneumatic Device;Joint Manipulations;Spinal Manipulations;Passive range of motion;Dry needling;Manual techniques;Cognitive remediation;Neuromuscular re-education;Balance training;Vestibular;Visual/perceptual remediation/compensation;Iontophoresis 4mg /ml Dexamethasone;DME Instruction;Gait  training;Stair training;Ultrasound;Functional mobility training;Fluidtherapy;Electrical Stimulation;Cryotherapy;Contrast Bath;Therapeutic exercise    PT Next Visit Plan Progress visual vestibular program as well as habituation , manual and balance activity as able.    PT Home Exercise Plan Eval: Saccades; 9/15: smooth pursuits ,  cervical isometrics. 9/20 forward leans (seated)    Consulted and Agree with Plan of Care Patient  Patient will benefit from skilled therapeutic intervention in order to improve the following deficits and impairments:  Dizziness, Decreased balance, Decreased activity tolerance, Impaired vision/preception  Visit Diagnosis: Dizziness and giddiness     Problem List Patient Active Problem List   Diagnosis Date Noted   Change in multiple pigmented skin lesions 01/14/2021   Scalp hematoma 12/23/2020   Acute hip pain, left 12/11/2020   Vertigo 12/11/2020   New daily persistent headache (ndph) 06/16/2020   Head trauma 06/10/2020   Hemorrhoids 06/05/2020   S/P hysterectomy 06/05/2020   Other headache syndrome 09/13/2019   Fidgeting 09/13/2019   Dysphagia 06/24/2019   History of 2019 novel coronavirus disease (COVID-19) 06/20/2019   Knee pain 01/30/2019   Recurrent cold sores 03/11/2018   Dyspepsia 03/07/2018   History of multiple sclerosis ([place]) 02/11/2018   Neck pain 02/11/2018   History of Bell's palsy 02/11/2018   Abnormal brain MRI 05/19/2017   Carpal tunnel syndrome, bilateral 05/19/2017   Abnormal stress test    Allergic rhinitis 06/20/2015   Dysesthesia 03/04/2015   OSA (obstructive sleep apnea) 10/01/2014   Increased homocysteine 10/01/2014   Urinary incontinence 05/30/2014   Back pain with radiation 02/05/2013   DISORDER OF BONE AND CARTILAGE UNSPECIFIED 12/16/2009   Overweight (BMI 25.0-29.9) 04/08/2008   Depression with anxiety 12/18/2007   Headache 12/18/2007   Hyperlipidemia LDL goal <100 12/16/2007   Essential  hypertension 12/16/2007  Rayetta Humphrey, PT CLT [phone]  01/20/2021, 2:04 PM  [place] [address] [place], [place], [address] Phone: [phone]   Fax:  [phone]  Name: CAMESHA FAROOQ MRN: [id number] Date of Birth: 1960-10-03

## 2021-01-22 ENCOUNTER — Ambulatory Visit (HOSPITAL_COMMUNITY): Payer: BC Managed Care – PPO | Admitting: Physical Therapy

## 2021-01-23 ENCOUNTER — Encounter (HOSPITAL_COMMUNITY): Payer: Self-pay

## 2021-01-23 ENCOUNTER — Telehealth (INDEPENDENT_AMBULATORY_CARE_PROVIDER_SITE_OTHER): Payer: BC Managed Care – PPO | Admitting: Psychiatry

## 2021-01-23 ENCOUNTER — Other Ambulatory Visit: Payer: Self-pay

## 2021-01-23 ENCOUNTER — Encounter (HOSPITAL_COMMUNITY): Payer: Self-pay | Admitting: Psychiatry

## 2021-01-23 DIAGNOSIS — F331 Major depressive disorder, recurrent, moderate: Secondary | ICD-10-CM

## 2021-01-23 MED ORDER — DULOXETINE HCL 60 MG PO CPEP: 120.0000 mg | ORAL_CAPSULE | Freq: Every day | ORAL | 3 refills | Status: DC

## 2021-01-23 MED ORDER — PRAZOSIN HCL 1 MG PO CAPS: 1.0000 mg | ORAL_CAPSULE | Freq: Every day | ORAL | 3 refills | Status: DC

## 2021-01-23 MED ORDER — CLONAZEPAM 0.5 MG PO TABS: 0.5000 mg | ORAL_TABLET | Freq: Every day | ORAL | 2 refills | Status: DC | PRN

## 2021-01-23 MED ORDER — BUPROPION HCL ER (XL) 300 MG PO TB24: 300.0000 mg | ORAL_TABLET | ORAL | 2 refills | Status: DC

## 2021-01-23 NOTE — Progress Notes (Signed)
Virtual Visit via Video Note  I connected with Pamela Walters on 01/23/21 at 11:00 AM EDT by a video enabled telemedicine application and verified that I am speaking with the correct person using two identifiers.  Location: Patient: home  Provider: home office   I discussed the limitations of evaluation and management by telemedicine and the availability of in person appointments. The patient expressed understanding and agreed to proceed.    I discussed the assessment and treatment plan with the patient. The patient was provided an opportunity to ask questions and all were answered. The patient agreed with the plan and demonstrated an understanding of the instructions.   The patient was advised to call back or seek an in-person evaluation if the symptoms worsen or if the condition fails to improve as anticipated.  I provided 20 minutes of non-face-to-face time during this encounter.   [name], MD  [facility] MD/PA/NP OP Progress Note  01/23/2021 11:29 AM Pamela Walters  MRN:  [id number]  Chief Complaint:  Chief Complaint   Anxiety; Depression; Follow-up    HPI: This patient is a 60 year old married white female who lives with her husband in [place].  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  The patient returns for follow-up after 6 weeks.  She states that on August 1 she was on vacation in [place] and fell on some stairs and hit her head on the concrete.  She was out for about 15 minutes.  She was taken to a local emergency room and had a negative brain CT and MRI.  Since then she has had persistent daily headaches and has been feeling more depressed with low energy and not feeling like doing much of anything.  She is also more off balance and dizzy.  She has been going to physical therapy for this.  She is not returned to work yet because she does not feel stable enough.  She states that she feels "useless" because she is not working.  Given all of the above  suggested that we increase the Wellbutrin XL to 300 mg along with the Cymbalta.  I urged her to get on a daily schedule so she has a purpose to her day.  She is sleeping "too much."  She denies thoughts of self-harm or suicidal ideation. Visit Diagnosis:    ICD-10-CM   1. Major depressive disorder, recurrent episode, moderate ([place])  F33.1       Past Psychiatric History: none  Past Medical History:  Past Medical History:  Diagnosis Date   Allergy    Anemia    Anxiety    Arthritis    Back pain    Bell's palsy 08/2017   Depression    Depression    Phreesia 05/19/2020   GERD (gastroesophageal reflux disease)    Hepatic steatosis    Hiatal hernia    History of Bell's palsy 02/11/2018   Hyperlipidemia    Hypertension    Internal hemorrhoids    MS (multiple sclerosis) ([place])    2007   Multiple sclerosis ([place])    Multiple sclerosis ([place]) 04/04/2008   Qualifier: Diagnosis of  By: Moshe Cipro MD, Margaret     Schatzki's ring    Sleep apnea    wears CPAP    Past Surgical History:  Procedure Laterality Date   ABDOMINAL HYSTERECTOMY  2005   fibroids   CARDIAC CATHETERIZATION N/A 05/04/2016   Procedure: Left Heart Cath and Coronary Angiography;  Surgeon: Jettie Booze,  MD;  Location: [facility];  Service: Cardiovascular;  Laterality: N/A;   COLONOSCOPY     ESOPHAGOGASTRODUODENOSCOPY N/A 09/18/2013   Procedure: ESOPHAGOGASTRODUODENOSCOPY (EGD);  Surgeon: Jerene Bears, MD;  Location: [facility];  Service: Gastroenterology;  Laterality: N/A;   UPPER GASTROINTESTINAL ENDOSCOPY     WISDOM TOOTH EXTRACTION      Family Psychiatric History: see below  Family History:  Family History  Problem Relation Age of Onset   Hypertension Mother    Hyperlipidemia Mother    Depression Mother    Hypertension Father    Hypertension Sister    High Cholesterol Sister    Healthy Son    Healthy Daughter    Colon cancer Neg Hx    Esophageal cancer Neg Hx    Rectal cancer Neg Hx     Stomach cancer Neg Hx    Colon polyps Neg Hx     Social History:  Social History   Socioeconomic History   Marital status: Married    Spouse name: Not on file   Number of children: Not on file   Years of education: Not on file   Highest education level: Not on file  Occupational History   Not on file  Tobacco Use   Smoking status: Never   Smokeless tobacco: Never  Vaping Use   Vaping Use: Never used  Substance and Sexual Activity   Alcohol use: No   Drug use: No   Sexual activity: Yes    Birth control/protection: Surgical    Comment: hyst  Other Topics Concern   Not on file  Social History Narrative   Not on file   Social Determinants of Health   Financial Resource Strain: Low Risk    Difficulty of Paying Living Expenses: Not very hard  Food Insecurity: No Food Insecurity   Worried About Running Out of Food in the Last Year: Never true   Ran Out of Food in the Last Year: Never true  Transportation Needs: No Transportation Needs   Lack of Transportation (Medical): No   Lack of Transportation (Non-Medical): No  Physical Activity: Insufficiently Active   Days of Exercise per Week: 3 days   Minutes of Exercise per Session: 30 min  Stress: Stress Concern Present   Feeling of Stress : Very much  Social Connections: Moderately Isolated   Frequency of Communication with Friends and Family: Once a week   Frequency of Social Gatherings with Friends and Family: Once a week   Attends Religious Services: More than 4 times per year   Active Member of [employer] or Organizations: No   Attends Archivist Meetings: Never   Marital Status: Married    Allergies: No Known Allergies  Metabolic Disorder Labs: Lab Results  Component Value Date   HGBA1C 5.5 05/10/2017   MPG 111 05/10/2017   MPG 108 02/20/2016   No results found for: PROLACTIN Lab Results  Component Value Date   CHOL 142 12/11/2020   TRIG 98 12/11/2020   HDL 64 12/11/2020   CHOLHDL 2.2 12/11/2020    VLDL 34 (H) 06/30/2016   LDLCALC 60 12/11/2020   LDLCALC 179 (H) 06/05/2020   Lab Results  Component Value Date   TSH 2.480 02/06/2020   TSH 2.36 01/30/2019    Therapeutic Level Labs: No results found for: LITHIUM No results found for: VALPROATE No components found for:  CBMZ  Current Medications: Current Outpatient Medications  Medication Sig Dispense Refill   buPROPion (WELLBUTRIN XL) 300 MG 24  hr tablet Take 1 tablet (300 mg total) by mouth every morning. 30 tablet 2   amLODipine (NORVASC) 5 MG tablet Take 1 tablet (5 mg total) by mouth daily. 90 tablet 3   aspirin 81 MG tablet Take 81 mg by mouth daily.     clonazePAM (KLONOPIN) 0.5 MG tablet Take 1 tablet (0.5 mg total) by mouth daily as needed for anxiety. 30 tablet 2   DULoxetine (CYMBALTA) 60 MG capsule Take 2 capsules (120 mg total) by mouth daily. 180 capsule 3   ezetimibe (ZETIA) 10 MG tablet TAKE 1 TABLET BY MOUTH  DAILY 90 tablet 3   gabapentin (NEURONTIN) 600 MG tablet Take 1 tablet (600 mg total) by mouth 3 (three) times daily. 270 tablet 3   meclizine (ANTIVERT) 12.5 MG tablet Take 1 tablet (12.5 mg total) by mouth 3 (three) times daily as needed for dizziness. 30 tablet 0   metaxalone (SKELAXIN) 800 MG tablet Take one tablet by mouth two times daily for  Muscle spasm 60 tablet 5   mometasone (NASONEX) 50 MCG/ACT nasal spray USE 2 SPRAYS NASALLY DAILY 51 g 1   Multiple Vitamin (MULTIVITAMIN WITH MINERALS) TABS tablet Take 1 tablet by mouth daily.     MYRBETRIQ 25 MG TB24 tablet TAKE 1 TABLET BY MOUTH  DAILY 90 tablet 3   prazosin (MINIPRESS) 1 MG capsule Take 1 capsule (1 mg total) by mouth at bedtime. 90 capsule 3   rosuvastatin (CRESTOR) 40 MG tablet TAKE 1 TABLET BY MOUTH  DAILY 90 tablet 3   traMADol (ULTRAM) 50 MG tablet TAKE 1 TO 2 TABLETS BY  MOUTH UP TO 4 PILLS DAILY  AS NEEDED , BUT NO MORE THAN 90 TABLETS PER MONTH 270 tablet 1   No current facility-administered medications for this visit.    Facility-Administered Medications Ordered in Other Visits  Medication Dose Route Frequency Provider Last Rate Last Admin   gadopentetate dimeglumine (MAGNEVIST) injection 20 mL  20 mL Intravenous Once PRN Sater, Nanine Means, MD         Musculoskeletal: Strength & Muscle Tone: na Gait & Station: na Patient leans: N/A  Psychiatric Specialty Exam: Review of Systems  Musculoskeletal:  Positive for arthralgias, gait problem and joint swelling.  Neurological:  Positive for dizziness and headaches.  Psychiatric/Behavioral:  Positive for decreased concentration and dysphoric mood. The patient is nervous/anxious.   All other systems reviewed and are negative.  There were no vitals taken for this visit.There is no height or weight on file to calculate BMI.  General Appearance: Casual and Fairly Groomed  Eye Contact:  Good  Speech:  Clear and Coherent  Volume:  Normal  Mood:  Dysphoric  Affect:  Appropriate and Congruent  Thought Process:  Goal Directed  Orientation:  Full (Time, Place, and Person)  Thought Content: Rumination   Suicidal Thoughts:  No  Homicidal Thoughts:  No  Memory:  Immediate;   Good Recent;   Good Remote;   Good  Judgement:  Good  Insight:  Good  Psychomotor Activity:  Decreased  Concentration:  Concentration: Fair and Attention Span: Fair  Recall:  good  Fund of Knowledge: Good  Language: Good  Akathisia:  No  Handed:  Right  AIMS (if indicated): not done  Assets:  Communication Skills Desire for Improvement Resilience Social Support Talents/Skills  ADL's:  Intact  Cognition: WNL  Sleep:  Good   Screenings: GAD-7    Flowsheet Row Office Visit from 06/05/2020 in [facility]  Total GAD-7 Score 14      PHQ2-9    Flowsheet Row Video Visit from 01/23/2021 in [facility] Office Visit from 01/10/2021 in [facility] Office Visit from 12/11/2020 in [facility] Video Visit from  10/11/2020 in [facility] from 10/02/2020 in [facility]  PHQ-2 Total Score 6 0 0 0 2  PHQ-9 Total Score 17 -- -- -- 12      Flowsheet Row Video Visit from 01/23/2021 in [facility] Video Visit from 10/11/2020 in [facility] Counselor from 10/02/2020 in [facility] No Risk No Risk No Risk        Assessment and Plan: This patient is a 60 year old female with a history of depression anxiety chronic fatigue and fibromyalgia.  She also suffered a head injury recently which is set her back a fair amount.  She feels lonely and useless having to sit at home waiting to recover.  I will increase the Wellbutrin XL to 300 mg daily to be taken along with the Cymbalta 60 mg twice daily for depression.  She will continue prazosin 1 mg at bedtime as it has helped nightmares and clonazepam 0.5 mg daily as needed for anxiety.  She will continue her therapy and return to see me in 4 weeks   [name], MD 01/23/2021, 11:29 AM

## 2021-01-28 ENCOUNTER — Ambulatory Visit (HOSPITAL_COMMUNITY): Payer: BC Managed Care – PPO | Admitting: Physical Therapy

## 2021-01-28 ENCOUNTER — Telehealth (HOSPITAL_COMMUNITY): Payer: Self-pay | Admitting: Physical Therapy

## 2021-01-28 NOTE — Telephone Encounter (Signed)
Pt l/m she is not feeling well and will not be here today

## 2021-01-29 ENCOUNTER — Ambulatory Visit (INDEPENDENT_AMBULATORY_CARE_PROVIDER_SITE_OTHER): Payer: BC Managed Care – PPO | Admitting: Psychiatry

## 2021-01-29 ENCOUNTER — Other Ambulatory Visit: Payer: Self-pay

## 2021-01-29 DIAGNOSIS — F331 Major depressive disorder, recurrent, moderate: Secondary | ICD-10-CM

## 2021-01-29 NOTE — Progress Notes (Signed)
Virtual Visit via Video Note  I connected with Pamela Walters on 01/29/21 at 11:07 AM EDt by a video enabled telemedicine application and verified that I am speaking with the correct person using two identifiers.  Location: Patient:  Home Provider: [place] office    I discussed the limitations of evaluation and management by telemedicine and the availability of in person appointments. The patient expressed understanding and agreed to proceed.  I provided 50 minutes of non-face-to-face time during this encounter.   Alonza Smoker, LCSW                     THERAPIST PROGRESS NOTE             Session Time:  Wednesday 01/29/2021 11:07 AM -  11:57 AM   Participation Level: Active  Behavioral Response: Casual/ alert, anxious, depressed           Type of Therapy: Individual Therapy        Treatment Goals: Elevate mood and show evidence of usual energy, activities, and socialization level , resolve conflicted  feelings and adapt to new life circumstances AEB by patient implementing 3 new activities that increase a level of satisfaction consistently for 3 weeks patient identifying 5 advantages of current life situation                                                 Interventions: Supportive,  CBT  Summary: Pamela Walters is a 60 y.o. female who is referred for services by PCP Dr. Moshe Cipro due to patient suffering symptoms of depression. Patient has been seen in this office interminttently for medication management and outpatient psychotherapy since 2016. She is resuming services  due to increased stress related to family conflict and her job.  Patient's reports increased irritability, decreased interest in activities, excessive worry, poor concentration, tearfulness, fatigue, restlessness, loss of appetite, and poor motivation.       Patient last was seen by virtual visit via video 2 weeks ago.  She reports increased symptoms of depression including excessive  sleeping, lack of energy, poor motivation, depressed mood, and negative ruminating thoughts.  She expresses frustration she is unable to work at this time.  She recently was informed she will not be able to return to work until the beginning of next year.  She fears she may be out of work  even longer as she is having difficulty scheduling appointments with specialist regarding her condition until February 2023.  She reports doing self-care regarding daily hygiene and small household tasks.  She expresses frustration she is unable to drive.  She also expresses frustration regarding doing some activities as poor concentration interferes with her ability to do this like she would have prior to her head injury. ahe has improved self-care regarding eating patterns as she has been motivated to prepare more healthy meals as husband now is using insulin.   Suicidal/Homicidal: No  Therapist Response:  Reviewed symptoms, discussed stressors, facilitated expression of thoughts and feelings, validated feelings, praised and reinforced patient's improved self-care, provided psychoeducation on the role of behavioral activation as well as structure and routine in overcoming depression, assisted patient identify value congruent activities to increase behavioral activation, developed plan with patient to use daily planning to increase behavioral activation, will send patient handouts (activity menu, daily planning forms) to  assist her in her efforts, assisted patient identify and address thoughts and processes that may inhibit implementation of plan, also assisted patient identify realistic expectations of self and ways to pace self  Plan: Return again in 2 weeks.            Diagnosis: Axis I: Major Depressive Disorder, Recurrent, Moderate    Axis II: No diagnosis    Alonza Smoker, LCSW 01/29/2021

## 2021-01-30 ENCOUNTER — Encounter (HOSPITAL_COMMUNITY): Payer: Self-pay | Admitting: Physical Therapy

## 2021-01-30 ENCOUNTER — Ambulatory Visit (HOSPITAL_COMMUNITY): Payer: BC Managed Care – PPO | Attending: Family Medicine | Admitting: Physical Therapy

## 2021-01-30 ENCOUNTER — Other Ambulatory Visit: Payer: Self-pay

## 2021-01-30 DIAGNOSIS — M542 Cervicalgia: Secondary | ICD-10-CM | POA: Diagnosis not present

## 2021-01-30 DIAGNOSIS — R2689 Other abnormalities of gait and mobility: Secondary | ICD-10-CM | POA: Diagnosis not present

## 2021-01-30 DIAGNOSIS — R42 Dizziness and giddiness: Secondary | ICD-10-CM | POA: Diagnosis not present

## 2021-01-30 NOTE — Therapy (Signed)
[place], [place], [address] Phone: [phone]   Fax:  [phone]  Physical Therapy Treatment  Patient Details  Name: Pamela Walters MRN: [id number] Date of Birth: Mar 26, 1961 Referring Provider (PT): [name] MD  Progress Note Reporting Period 01/06/21 to 01/30/21  See note below for Objective Data and Assessment of Progress/Goals.      Encounter Date: 01/30/2021   PT End of Session - 01/30/21 1002     Visit Number 5    Number of Visits 13    Date for PT Re-Evaluation 02/27/21    Authorization Type [employer]BS COMM PPO (90 VL)    Authorization - Visit Number 5    Authorization - Number of Visits 95    PT Start Time 2751    PT Stop Time 1032    PT Time Calculation (min) 44 min    Activity Tolerance Patient tolerated treatment well    Behavior During Therapy WFL for tasks assessed/performed             Past Medical History:  Diagnosis Date   Allergy    Anemia    Anxiety    Arthritis    Back pain    Bell's palsy 08/2017   Depression    Depression    Phreesia 05/19/2020   GERD (gastroesophageal reflux disease)    Hepatic steatosis    Hiatal hernia    History of Bell's palsy 02/11/2018   Hyperlipidemia    Hypertension    Internal hemorrhoids    MS (multiple sclerosis) ([place])    2007   Multiple sclerosis ([place])    Multiple sclerosis ([place]) 04/04/2008   Qualifier: Diagnosis of  By: Moshe Cipro MD, Margaret     Schatzki's ring    Sleep apnea    wears CPAP    Past Surgical History:  Procedure Laterality Date   ABDOMINAL HYSTERECTOMY  2005   fibroids   CARDIAC CATHETERIZATION N/A 05/04/2016   Procedure: Left Heart Cath and Coronary Angiography;  Surgeon: Jettie Booze, MD;  Location: [facility];  Service: Cardiovascular;  Laterality: N/A;   COLONOSCOPY     ESOPHAGOGASTRODUODENOSCOPY N/A 09/18/2013   Procedure: ESOPHAGOGASTRODUODENOSCOPY (EGD);  Surgeon: Jerene Bears, MD;   Location: [facility];  Service: Gastroenterology;  Laterality: N/A;   UPPER GASTROINTESTINAL ENDOSCOPY     WISDOM TOOTH EXTRACTION      There were no vitals filed for this visit.   Subjective Assessment - 01/30/21 1001     Subjective Patient states vertigo is better. She has been doing home exercise. Still has random dizziness with various activity. Seems to be random. Had an episode standing in the shower the other day. She saw stars and felt very dizzy. It lasted for a few minutes and it resolved. She reports about 75% improvement since starting therapy. Feels that frequency has decreased.    Pertinent History [place] and hit her head    Limitations Standing;Walking;House hold activities    Patient Stated Goals Get back to baseline    Currently in Pain? Yes    Pain Score 3     Pain Location Head    Pain Orientation Posterior    Pain Descriptors / Indicators Aching    Pain Onset In the past 7 days                [facility] PT Assessment - 01/30/21 0001       Assessment   Medical Diagnosis Vertigo  Referring Provider (PT) [name] MD    Onset Date/Surgical Date 11/25/20      Precautions   Precautions Fall      Restrictions   Weight Bearing Restrictions No      Balance Screen   Has the patient fallen in the past 6 months No   not since starting therapy     [place] residence      Prior Function   Level of Independence Independent      Cognition   Overall Cognitive Status Within Functional Limits for tasks assessed      Observation/Other Assessments   Focus on Therapeutic Outcomes (FOTO)  48% function   was 46%     Balance   Balance Assessed Yes      Static Standing Balance   Static Standing Balance -  Activities  Tandam Stance - Right Leg;Tandam Stance - Left Leg    Static Standing - Comment/# of Minutes severe sway bilaterally < 3 sec, falls posterior and LT                 Vestibular Assessment -  01/30/21 0001       Oculomotor Exam   Saccades Slow;Undershoots      Positional Sensitivities   Supine to Left Side Moderate dizziness    Supine to Right Side No dizziness    Head Turning x 5 Mild dizziness    Head Nodding x 5 Moderate dizziness    Positional Sensitivities Comments sit to RT side lying mild dizziness; sit to LT sidelying "little dizzy"                                 PT Short Term Goals - 01/30/21 1030       PT SHORT TERM GOAL #1   Title Patient will be independent with initial HEP and self-management strategies to improve functional outcomes    Baseline Reports compliance    Time 2    Period Weeks    Status On-going    Target Date 01/20/21               PT Long Term Goals - 01/30/21 1030       PT LONG TERM GOAL #1   Title Patient will improve FOTO score to predicted value to indicate improvement in functional outcomes    Baseline improved 2%    Time 4    Period Weeks    Status On-going      PT LONG TERM GOAL #2   Title Patient will report at least 80% overall improvement in subjective complaint to indicate improvement in ability to perform ADLs.    Baseline reports 75% improved    Time 4    Period Weeks    Status On-going      PT LONG TERM GOAL #3   Title Patient will report no vertigo with position changes in and out of bed for improved sleep quality, function, QOL    Baseline Partial improvement, less frequent but ongoing    Time 4    Period Weeks    Status On-going                   Plan - 01/30/21 1110     Clinical Impression Statement Patient continues to demo increase dizziness with provocative vestibular testing, mostly with activity moving toward LT. Ongoing variable onset of dizziness with position  changes, but also spontaneous as reported by patient. Patient does have neurology consult scheduled, but not until January. Patient does report significant subjective improvement in frequency of  dizziness. Patient remains very unsteady with static standing balance. At this point, patient would likely continue to benefit from skilled therapy services to progress vestibular habituation and balance activity to improve functional mobility and reduce risk for future falls.    Examination-Activity Limitations Locomotion Level;Transfers;Sleep;Caring for Others;Other;Lift    Examination-Participation Restrictions Occupation;Community Activity;Laundry;Yard Work;Cleaning;Shop    Stability/Clinical Decision Making Stable/Uncomplicated    Rehab Potential Good    PT Frequency 2x / week    PT Duration 4 weeks    PT Treatment/Interventions ADLs/Self Care Home Management;Moist Heat;Traction;Biofeedback;Canalith Repostioning;Parrafin;Therapeutic activities;Patient/family education;Compression bandaging;Energy conservation;Splinting;Taping;Vasopneumatic Device;Joint Manipulations;Spinal Manipulations;Passive range of motion;Dry needling;Manual techniques;Cognitive remediation;Neuromuscular re-education;Balance training;Vestibular;Visual/perceptual remediation/compensation;Iontophoresis 4mg /ml Dexamethasone;DME Instruction;Gait training;Stair training;Ultrasound;Functional mobility training;Fluidtherapy;Electrical Stimulation;Cryotherapy;Contrast Bath;Therapeutic exercise    PT Next Visit Plan Progress visual vestibular program as well as habituation , manual and balance activity as able.    PT Home Exercise Plan Eval: Saccades; 9/15: smooth pursuits ,  cervical isometrics. 9/20 forward leans (seated)    Consulted and Agree with Plan of Care Patient             Patient will benefit from skilled therapeutic intervention in order to improve the following deficits and impairments:  Dizziness, Decreased balance, Decreased activity tolerance, Impaired vision/preception  Visit Diagnosis: Dizziness and giddiness     Problem List Patient Active Problem List   Diagnosis Date Noted   Change in multiple  pigmented skin lesions 01/14/2021   Scalp hematoma 12/23/2020   Acute hip pain, left 12/11/2020   Vertigo 12/11/2020   New daily persistent headache (ndph) 06/16/2020   Head trauma 06/10/2020   Hemorrhoids 06/05/2020   S/P hysterectomy 06/05/2020   Other headache syndrome 09/13/2019   Fidgeting 09/13/2019   Dysphagia 06/24/2019   History of 2019 novel coronavirus disease (COVID-19) 06/20/2019   Knee pain 01/30/2019   Recurrent cold sores 03/11/2018   Dyspepsia 03/07/2018   History of multiple sclerosis ([place]) 02/11/2018   Neck pain 02/11/2018   History of Bell's palsy 02/11/2018   Abnormal brain MRI 05/19/2017   Carpal tunnel syndrome, bilateral 05/19/2017   Abnormal stress test    Allergic rhinitis 06/20/2015   Dysesthesia 03/04/2015   OSA (obstructive sleep apnea) 10/01/2014   Increased homocysteine 10/01/2014   Urinary incontinence 05/30/2014   Back pain with radiation 02/05/2013   DISORDER OF BONE AND CARTILAGE UNSPECIFIED 12/16/2009   Overweight (BMI 25.0-29.9) 04/08/2008   Depression with anxiety 12/18/2007   Headache 12/18/2007   Hyperlipidemia LDL goal <100 12/16/2007   Essential hypertension 12/16/2007   11:12 AM, 01/30/21 Josue Hector PT DPT  Physical Therapist with [facility]  [phone] [place], [place], [address] Phone: [phone]   Fax:  [phone]  Name: Pamela Walters MRN: [id number] Date of Birth: 1961/02/24

## 2021-02-04 ENCOUNTER — Encounter (HOSPITAL_COMMUNITY): Payer: BC Managed Care – PPO | Admitting: Physical Therapy

## 2021-02-06 ENCOUNTER — Other Ambulatory Visit: Payer: Self-pay

## 2021-02-06 ENCOUNTER — Ambulatory Visit (HOSPITAL_COMMUNITY): Payer: BC Managed Care – PPO | Admitting: Physical Therapy

## 2021-02-06 DIAGNOSIS — R42 Dizziness and giddiness: Secondary | ICD-10-CM

## 2021-02-06 NOTE — Therapy (Signed)
[place], [place], [address] Phone: [phone]   Fax:  [phone]  Physical Therapy Treatment  Patient Details  Name: Pamela Walters MRN: [id number] Date of Birth: 1960-08-14 Referring Provider (PT): [name] MD   Encounter Date: 02/06/2021   PT End of Session - 02/06/21 1005     Visit Number 6    Number of Visits 13    Date for PT Re-Evaluation 02/27/21    Authorization Type BCBS COMM PPO (90 VL)    Authorization - Visit Number 6    Authorization - Number of Visits 90    Progress Note Due on Visit 13    PT Start Time 1005    PT Stop Time 0998    PT Time Calculation (min) 40 min    Activity Tolerance Patient tolerated treatment well    Behavior During Therapy [facility] for tasks assessed/performed             Past Medical History:  Diagnosis Date   Allergy    Anemia    Anxiety    Arthritis    Back pain    Bell's palsy 08/2017   Depression    Depression    Phreesia 05/19/2020   GERD (gastroesophageal reflux disease)    Hepatic steatosis    Hiatal hernia    History of Bell's palsy 02/11/2018   Hyperlipidemia    Hypertension    Internal hemorrhoids    MS (multiple sclerosis) ([place])    2007   Multiple sclerosis ([place])    Multiple sclerosis ([place]) 04/04/2008   Qualifier: Diagnosis of  By: Moshe Cipro MD, Margaret     Schatzki's ring    Sleep apnea    wears CPAP    Past Surgical History:  Procedure Laterality Date   ABDOMINAL HYSTERECTOMY  2005   fibroids   CARDIAC CATHETERIZATION N/A 05/04/2016   Procedure: Left Heart Cath and Coronary Angiography;  Surgeon: Jettie Booze, MD;  Location: [facility];  Service: Cardiovascular;  Laterality: N/A;   COLONOSCOPY     ESOPHAGOGASTRODUODENOSCOPY N/A 09/18/2013   Procedure: ESOPHAGOGASTRODUODENOSCOPY (EGD);  Surgeon: Jerene Bears, MD;  Location: [facility];  Service: Gastroenterology;  Laterality: N/A;   UPPER GASTROINTESTINAL  ENDOSCOPY     WISDOM TOOTH EXTRACTION      There were no vitals filed for this visit.   Subjective Assessment - 02/06/21 1006     Subjective PT states that the main thing that is bothering her at this time is her constant headache.    Pertinent History [place] and hit her head    Limitations Standing;Walking;House hold activities    Currently in Pain? Yes    Pain Score 4     Pain Location Head    Pain Orientation Posterior;Lower    Pain Descriptors / Indicators Aching    Pain Type Acute pain    Pain Onset 1 to 4 weeks ago    Pain Frequency Constant    Aggravating Factors  not sure    Pain Relieving Factors meds    Effect of Pain on Daily Activities limits                               [facility] Adult PT Treatment/Exercise - 02/06/21 0001       Exercises   Exercises Neck      Manual Therapy   Manual Therapy Soft tissue mobilization;Myofascial release  Manual therapy comments done seperate from all other aspects of treatment    Soft tissue mobilization to decrease tension and pain    Myofascial Release to decrease distal pull of fascia.             Vestibular Treatment/Exercise - 02/06/21 0001       Vestibular Treatment/Exercise   Vestibular Treatment Provided Habituation (P)     Habituation Exercises Horizontal Roll;Seated Horizontal Head Turns;Seated Vertical Head Turns (P)       Horizontal Roll   Number of Reps  5 (P)     Symptom Description  mild (P)       Seated Horizontal Head Turns   Number of Reps  5 (P)     Symptom Description  feels different (P)       Seated Vertical Head Turns   Number of Reps  5 (P)                 Balance Exercises - 02/06/21 0001       Balance Exercises: Standing   Standing Eyes Opened Narrow base of support (BOS);Head turns;5 reps    Standing Eyes Closed Narrow base of support (BOS);Solid surface    Tandem Stance 2 reps;Eyes open    SLS Eyes open;3 reps    Wall Bumps Shoulder;10 reps;Other reps  (comment)   lt lateral bumps                 PT Short Term Goals - 01/30/21 1030       PT SHORT TERM GOAL #1   Title Patient will be independent with initial HEP and self-management strategies to improve functional outcomes    Baseline Reports compliance    Time 2    Period Weeks    Status On-going    Target Date 01/20/21               PT Long Term Goals - 01/30/21 1030       PT LONG TERM GOAL #1   Title Patient will improve FOTO score to predicted value to indicate improvement in functional outcomes    Baseline improved 2%    Time 4    Period Weeks    Status On-going      PT LONG TERM GOAL #2   Title Patient will report at least 80% overall improvement in subjective complaint to indicate improvement in ability to perform ADLs.    Baseline reports 75% improved    Time 4    Period Weeks    Status On-going      PT LONG TERM GOAL #3   Title Patient will report no vertigo with position changes in and out of bed for improved sleep quality, function, QOL    Baseline Partial improvement, less frequent but ongoing    Time 4    Period Weeks    Status On-going                   Plan - 02/06/21 1049     Clinical Impression Statement Added habituation exercises to pt HEP.  Continued with manual to improve myofascial inferior pull to decrease headaches and balance to reduce the risk of falls    Examination-Activity Limitations Locomotion Level;Transfers;Sleep;Caring for Others;Other;Lift    Examination-Participation Restrictions Occupation;Community Activity;Laundry;Yard Work;Cleaning;Shop    Stability/Clinical Decision Making Stable/Uncomplicated    Rehab Potential Good    PT Frequency 2x / week    PT Duration 4 weeks    PT Treatment/Interventions ADLs/Self  Care Home Management;Moist Heat;Traction;Biofeedback;Canalith Repostioning;Parrafin;Therapeutic activities;Patient/family education;Compression bandaging;Energy  conservation;Splinting;Taping;Vasopneumatic Device;Joint Manipulations;Spinal Manipulations;Passive range of motion;Dry needling;Manual techniques;Cognitive remediation;Neuromuscular re-education;Balance training;Vestibular;Visual/perceptual remediation/compensation;Iontophoresis 4mg /ml Dexamethasone;DME Instruction;Gait training;Stair training;Ultrasound;Functional mobility training;Fluidtherapy;Electrical Stimulation;Cryotherapy;Contrast Bath;Therapeutic exercise    PT Next Visit Plan Progress visual vestibular program as well as habituation , manual and balance activity as able.    PT Home Exercise Plan Eval: Saccades; 9/15: smooth pursuits ,  cervical isometrics. 9/20 forward leans (seated)    Consulted and Agree with Plan of Care Patient             Patient will benefit from skilled therapeutic intervention in order to improve the following deficits and impairments:  Dizziness, Decreased balance, Decreased activity tolerance, Impaired vision/preception  Visit Diagnosis: Dizziness and giddiness     Problem List Patient Active Problem List   Diagnosis Date Noted   Change in multiple pigmented skin lesions 01/14/2021   Scalp hematoma 12/23/2020   Acute hip pain, left 12/11/2020   Vertigo 12/11/2020   New daily persistent headache (ndph) 06/16/2020   Head trauma 06/10/2020   Hemorrhoids 06/05/2020   S/P hysterectomy 06/05/2020   Other headache syndrome 09/13/2019   Fidgeting 09/13/2019   Dysphagia 06/24/2019   History of 2019 novel coronavirus disease (COVID-19) 06/20/2019   Knee pain 01/30/2019   Recurrent cold sores 03/11/2018   Dyspepsia 03/07/2018   History of multiple sclerosis ([place]) 02/11/2018   Neck pain 02/11/2018   History of Bell's palsy 02/11/2018   Abnormal brain MRI 05/19/2017   Carpal tunnel syndrome, bilateral 05/19/2017   Abnormal stress test    Allergic rhinitis 06/20/2015   Dysesthesia 03/04/2015   OSA (obstructive sleep apnea) 10/01/2014   Increased  homocysteine 10/01/2014   Urinary incontinence 05/30/2014   Back pain with radiation 02/05/2013   DISORDER OF BONE AND CARTILAGE UNSPECIFIED 12/16/2009   Overweight (BMI 25.0-29.9) 04/08/2008   Depression with anxiety 12/18/2007   Headache 12/18/2007   Hyperlipidemia LDL goal <100 12/16/2007   Essential hypertension 12/16/2007   Rayetta Humphrey, PT CLT [phone] , PT 02/06/2021, 10:50 AM  [place], [place], [address] Phone: [phone]   Fax:  [phone]  Name: KERRIANN KAMPHUIS MRN: [id number] Date of Birth: 1960-08-25

## 2021-02-10 ENCOUNTER — Other Ambulatory Visit: Payer: Self-pay

## 2021-02-10 ENCOUNTER — Ambulatory Visit (HOSPITAL_COMMUNITY): Payer: BC Managed Care – PPO | Admitting: Physical Therapy

## 2021-02-10 ENCOUNTER — Encounter (HOSPITAL_COMMUNITY): Payer: Self-pay | Admitting: Physical Therapy

## 2021-02-10 DIAGNOSIS — R42 Dizziness and giddiness: Secondary | ICD-10-CM

## 2021-02-10 NOTE — Patient Instructions (Signed)
Access Code: [email or web address] URL: [email or web address] Date: 02/10/2021 Prepared by: Josue Hector  Exercises Seated Upper Trap Stretch - 2-3 x daily - 7 x weekly - 1 sets - 3 reps - 30 second hold Seated Levator Scapulae Stretch - 2-3 x daily - 7 x weekly - 1 sets - 3 reps - 30 second hold

## 2021-02-10 NOTE — Therapy (Signed)
[place], [place], [address] Phone: [phone]   Fax:  [phone]  Physical Therapy Treatment  Patient Details  Name: Pamela Walters MRN: [id number] Date of Birth: 12/31/1960 Referring Provider (PT): [name] MD   Encounter Date: 02/10/2021   PT End of Session - 02/10/21 1605     Visit Number 7    Number of Visits 13    Date for PT Re-Evaluation 02/27/21    Authorization Type BCBS COMM PPO (90 VL)    Authorization - Visit Number 7    Authorization - Number of Visits 90    Progress Note Due on Visit 13    PT Start Time 1602    PT Stop Time 9381    PT Time Calculation (min) 41 min    Activity Tolerance Patient tolerated treatment well    Behavior During Therapy [facility] for tasks assessed/performed             Past Medical History:  Diagnosis Date   Allergy    Anemia    Anxiety    Arthritis    Back pain    Bell's palsy 08/2017   Depression    Depression    Phreesia 05/19/2020   GERD (gastroesophageal reflux disease)    Hepatic steatosis    Hiatal hernia    History of Bell's palsy 02/11/2018   Hyperlipidemia    Hypertension    Internal hemorrhoids    MS (multiple sclerosis) ([place])    2007   Multiple sclerosis ([place])    Multiple sclerosis ([place]) 04/04/2008   Qualifier: Diagnosis of  By: Moshe Cipro MD, Margaret     Schatzki's ring    Sleep apnea    wears CPAP    Past Surgical History:  Procedure Laterality Date   ABDOMINAL HYSTERECTOMY  2005   fibroids   CARDIAC CATHETERIZATION N/A 05/04/2016   Procedure: Left Heart Cath and Coronary Angiography;  Surgeon: Jettie Booze, MD;  Location: [facility];  Service: Cardiovascular;  Laterality: N/A;   COLONOSCOPY     ESOPHAGOGASTRODUODENOSCOPY N/A 09/18/2013   Procedure: ESOPHAGOGASTRODUODENOSCOPY (EGD);  Surgeon: Jerene Bears, MD;  Location: [facility];  Service: Gastroenterology;  Laterality: N/A;   UPPER GASTROINTESTINAL  ENDOSCOPY     WISDOM TOOTH EXTRACTION      There were no vitals filed for this visit.   Subjective Assessment - 02/10/21 1605     Subjective "same old same". Feels she is getting better overall, working HEP at home.    Pertinent History [place] and hit her head    Limitations Standing;Walking;House hold activities    Currently in Pain? Yes    Pain Score 2     Pain Location Head    Pain Descriptors / Indicators Aching    Pain Onset 1 to 4 weeks ago                               [facility] Adult PT Treatment/Exercise - 02/10/21 0001       Neck Exercises: Seated   Other Seated Exercise chin tuck 10 x 5", scapular retraction 20 x5", W back 10 x 5". Upper trap and levator stretch 3 x 30" each      Manual Therapy   Manual Therapy Soft tissue mobilization;Myofascial release    Manual therapy comments done seperate from all other aspects of treatment    Soft tissue mobilization STM to  bilateral sub occipitals    Myofascial Release suboccipital release                 Balance Exercises - 02/10/21 0001       Balance Exercises: Standing   Standing Eyes Closed Narrow base of support (BOS);Head turns   x15 reps   Tandem Stance 30 secs;2 reps                  PT Short Term Goals - 01/30/21 1030       PT SHORT TERM GOAL #1   Title Patient will be independent with initial HEP and self-management strategies to improve functional outcomes    Baseline Reports compliance    Time 2    Period Weeks    Status On-going    Target Date 01/20/21               PT Long Term Goals - 01/30/21 1030       PT LONG TERM GOAL #1   Title Patient will improve FOTO score to predicted value to indicate improvement in functional outcomes    Baseline improved 2%    Time 4    Period Weeks    Status On-going      PT LONG TERM GOAL #2   Title Patient will report at least 80% overall improvement in subjective complaint to indicate improvement in ability to perform  ADLs.    Baseline reports 75% improved    Time 4    Period Weeks    Status On-going      PT LONG TERM GOAL #3   Title Patient will report no vertigo with position changes in and out of bed for improved sleep quality, function, QOL    Baseline Partial improvement, less frequent but ongoing    Time 4    Period Weeks    Status On-going                   Plan - 02/10/21 1640     Clinical Impression Statement Patient showing improved static balance. Performed manual STM to address complaint of neck pain/ HA and improve possible connection to cervicogenic dizziness symptoms. Patient tolerated well. Educated on purpose and function of all added activity and sub occipital anatomy and contribution to HA symptoms. Patient will continue to benefit from skilled therapy services to reduce deficits and improve functional ability.    Examination-Activity Limitations Locomotion Level;Transfers;Sleep;Caring for Others;Other;Lift    Examination-Participation Restrictions Occupation;Community Activity;Laundry;Yard Work;Cleaning;Shop    Stability/Clinical Decision Making Stable/Uncomplicated    Rehab Potential Good    PT Frequency 2x / week    PT Duration 4 weeks    PT Treatment/Interventions ADLs/Self Care Home Management;Moist Heat;Traction;Biofeedback;Canalith Repostioning;Parrafin;Therapeutic activities;Patient/family education;Compression bandaging;Energy conservation;Splinting;Taping;Vasopneumatic Device;Joint Manipulations;Spinal Manipulations;Passive range of motion;Dry needling;Manual techniques;Cognitive remediation;Neuromuscular re-education;Balance training;Vestibular;Visual/perceptual remediation/compensation;Iontophoresis 4mg /ml Dexamethasone;DME Instruction;Gait training;Stair training;Ultrasound;Functional mobility training;Fluidtherapy;Electrical Stimulation;Cryotherapy;Contrast Bath;Therapeutic exercise    PT Next Visit Plan Progress visual vestibular program as well as habituation ,  manual and balance activity as able.    PT Home Exercise Plan Eval: Saccades; 9/15: smooth pursuits ,  cervical isometrics. 9/20 forward leans (seated) 10/17 upper trap and levator stretch    Consulted and Agree with Plan of Care Patient             Patient will benefit from skilled therapeutic intervention in order to improve the following deficits and impairments:  Dizziness, Decreased balance, Decreased activity tolerance, Impaired vision/preception  Visit Diagnosis: Dizziness and giddiness  Problem List Patient Active Problem List   Diagnosis Date Noted   Change in multiple pigmented skin lesions 01/14/2021   Scalp hematoma 12/23/2020   Acute hip pain, left 12/11/2020   Vertigo 12/11/2020   New daily persistent headache (ndph) 06/16/2020   Head trauma 06/10/2020   Hemorrhoids 06/05/2020   S/P hysterectomy 06/05/2020   Other headache syndrome 09/13/2019   Fidgeting 09/13/2019   Dysphagia 06/24/2019   History of 2019 novel coronavirus disease (COVID-19) 06/20/2019   Knee pain 01/30/2019   Recurrent cold sores 03/11/2018   Dyspepsia 03/07/2018   History of multiple sclerosis ([place]) 02/11/2018   Neck pain 02/11/2018   History of Bell's palsy 02/11/2018   Abnormal brain MRI 05/19/2017   Carpal tunnel syndrome, bilateral 05/19/2017   Abnormal stress test    Allergic rhinitis 06/20/2015   Dysesthesia 03/04/2015   OSA (obstructive sleep apnea) 10/01/2014   Increased homocysteine 10/01/2014   Urinary incontinence 05/30/2014   Back pain with radiation 02/05/2013   DISORDER OF BONE AND CARTILAGE UNSPECIFIED 12/16/2009   Overweight (BMI 25.0-29.9) 04/08/2008   Depression with anxiety 12/18/2007   Headache 12/18/2007   Hyperlipidemia LDL goal <100 12/16/2007   Essential hypertension 12/16/2007   4:46 PM, 02/10/21 Josue Hector PT DPT  Physical Therapist with [facility]  [phone] [place] [place], [place], [address] Phone: [phone]   Fax:  [phone]  Name: Pamela Walters MRN: [id number] Date of Birth: 26-Nov-1960

## 2021-02-11 ENCOUNTER — Ambulatory Visit: Payer: BC Managed Care – PPO | Admitting: Neurology

## 2021-02-11 ENCOUNTER — Encounter: Payer: Self-pay | Admitting: Neurology

## 2021-02-11 VITALS — BP 129/82 | HR 82 | Ht 63.0 in | Wt 178.0 lb

## 2021-02-11 DIAGNOSIS — R292 Abnormal reflex: Secondary | ICD-10-CM

## 2021-02-11 DIAGNOSIS — R269 Unspecified abnormalities of gait and mobility: Secondary | ICD-10-CM | POA: Diagnosis not present

## 2021-02-11 DIAGNOSIS — R9089 Other abnormal findings on diagnostic imaging of central nervous system: Secondary | ICD-10-CM

## 2021-02-11 DIAGNOSIS — M542 Cervicalgia: Secondary | ICD-10-CM

## 2021-02-11 DIAGNOSIS — G44309 Post-traumatic headache, unspecified, not intractable: Secondary | ICD-10-CM | POA: Diagnosis not present

## 2021-02-11 MED ORDER — ALPRAZOLAM 0.5 MG PO TABS: ORAL_TABLET | ORAL | 0 refills | Status: DC

## 2021-02-11 MED ORDER — ZONISAMIDE 100 MG PO CAPS: 100.0000 mg | ORAL_CAPSULE | Freq: Every day | ORAL | 11 refills | Status: DC

## 2021-02-11 NOTE — Progress Notes (Signed)
[facility]  PATIENT: Pamela Walters DOB: [birth year]  REFERRING CLINICIAN: Tula Walters HISTORY FROM: Patient REASON FOR VISIT: Possible MS, fatigue, myalgias   HISTORICAL  CHIEF COMPLAINT:  Chief Complaint  Patient presents with   Follow-up    Rm 2, alone. Here for HA and balance concerns. MS pt, currently off DMT. Pt reports falling and hitting her head onAug 1st while on vacation at [facility]. Pt was seen at the hospital for concussion. Pt is currently going to PT for balance. Pt reports dizziness. HA have been constant after hitting her head. Starts on the back of head and moves to the top. Taking ibuprofen and tramadol.     HISTORY OF PRESENT ILLNESS:  She is a 60 y.o. woman who has headache, possible MS and obstructive sleep apnea  Update 02/11/2021 She was on vacation and she fell going up the stairs on the deck.   She fell backwards and had LOC for several minutess.   She went to [facility] and the scans were fine.   SHe had a large hematoma and blood actually layered into her upper chect   She had post-concussive HA and dizziness (with movement).   She flet gait ws more unsteady.     OSA   She has reduced compliance since the injury as her scalp hurts.   She was wearin it nightl and is going to try to do so.   Download a the last visit showed  AHI is good at 4.1.    PSG was performed 08/20/2014.   She uses [employer]      She has had more depression.   She is out of work due to the concussion.     She is also on duloxetine 120 mg daily and Wellbutrin.  .    In the past, she was diagnosed with MS and was on DMTs for a while (none since 2011).   MRI shows nonspecific foci.     She hasn't felt well with more fatigue and more headaches.     She has pain and she takes gabapentin 600 mg po tid with some benefit.       MS History: In  2006 and 2007 she presented with headaches. Headaches were bilateral and were associated with visual aura. Pain was  throbbing and she had associated photophobia and phonophobia.  Pamela Walters underwent an MRI of the brain and an MR angiogram. The angiogram was normal but the MRI of the brain showed multiple white matter foci in a nonspecific pattern. She reports that Pamela Walters did a lumbar puncture and she was told that the results were abnormal, consistent with MS. She was placed on Betaseron but had difficulties tolerating it. She has not been on any DMT for her MS over the last 4 - 5 years.  She did not have any definite exacerbations.  Specifically, there were no episodes of numbness, weakness, clumsiness or visual change that came on over a short period of time.  A repeat MRI of the brain showed that the might of been some progression of the MRI changes. An MRI of the thoracic spine did not show any spinal lesions.    DATA :   I reviewed her MRI images from 12//2016.  She has multiple white matter lesions and the majority are round and either subcortical or in the deep white matter.  A couple smaller ones are in the periventricular white matter.   There is symmetric hyperintensity in the  mid midbrain.   I reviewed recent labwork showing normal vasculitis labs but elevated homocysteine.     MRI of the thoracic spine at that time showed no spinal cord lesions. MRI of the lumbar spine shows degenerative changes at a worse at L4-L5. She has 4 mm of anterolisthesis at that level  MRI brain 07/08/2020 allowing for differences in scanners, brain is +/- unchanged vs very slight progression     REVIEW OF SYSTEMS:  Constitutional: No fevers, chills, sweats, or change in appetite.   She has fatigue Eyes: No double vision,.   She notes eye pain Ear, nose and throat: No hearing loss, ear pain, nasal congestion, sore throat Cardiovascular: No chest pain, palpitations Respiratory:  No shortness of breath at rest or with exertion.   No wheezes GastrointestinaI: No nausea, vomiting, diarrhea, abdominal pain, fecal  incontinence.   She has a hiatal hernia.  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   Genitourinary:  reports frequency and has 2 x nocturia.  No nocturia. Musculoskeletal:  No neck pain.  She reports lower back pain and has L4L5 DJD Integumentary: No rash, pruritus, skin lesions Neurological: as above Psychiatric: No depression at this time.  No anxiety Endocrine: Her weight is stable.   She has had hot flashes recently Hematologic/Lymphatic:  No anemia, purpura, petechiae. Allergic/Immunologic: No itchy/runny eyes, nasal congestion, recent allergic reactions, rashes  ALLERGIES: No Known Allergies  HOME MEDICATIONS: Outpatient Medications Prior to Visit  Medication Sig Dispense Refill   amLODipine (NORVASC) 5 MG tablet Take 1 tablet (5 mg total) by mouth daily. 90 tablet 3   aspirin 81 MG tablet Take 81 mg by mouth daily.     buPROPion (WELLBUTRIN XL) 300 MG 24 hr tablet Take 1 tablet (300 mg total) by mouth every morning. 30 tablet 2   clonazePAM (KLONOPIN) 0.5 MG tablet Take 1 tablet (0.5 mg total) by mouth daily as needed for anxiety. 30 tablet 2   DULoxetine (CYMBALTA) 60 MG capsule Take 2 capsules (120 mg total) by mouth daily. 180 capsule 3   ezetimibe (ZETIA) 10 MG tablet TAKE 1 TABLET BY MOUTH  DAILY 90 tablet 3   gabapentin (NEURONTIN) 600 MG tablet Take 1 tablet (600 mg total) by mouth 3 (three) times daily. 270 tablet 3   meclizine (ANTIVERT) 12.5 MG tablet Take 1 tablet (12.5 mg total) by mouth 3 (three) times daily as needed for dizziness. 30 tablet 0   metaxalone (SKELAXIN) 800 MG tablet Take one tablet by mouth two times daily for  Muscle spasm 60 tablet 5   mometasone (NASONEX) 50 MCG/ACT nasal spray USE 2 SPRAYS NASALLY DAILY 51 g 1   Multiple Vitamin (MULTIVITAMIN WITH MINERALS) TABS tablet Take 1 tablet by mouth daily.     MYRBETRIQ 25 MG TB24 tablet TAKE 1 TABLET BY MOUTH  DAILY 90 tablet 3   prazosin (MINIPRESS) 1 MG capsule Take 1 capsule (1 mg total)  by mouth at bedtime. 90 capsule 3   rosuvastatin (CRESTOR) 40 MG tablet TAKE 1 TABLET BY MOUTH  DAILY 90 tablet 3   traMADol (ULTRAM) 50 MG tablet TAKE 1 TO 2 TABLETS BY  MOUTH UP TO 4 PILLS DAILY  AS NEEDED , BUT NO MORE THAN 90 TABLETS PER MONTH 270 tablet 1   Facility-Administered Medications Prior to Visit  Medication Dose Route Frequency Provider Last Rate Last Admin   gadopentetate dimeglumine (MAGNEVIST) injection 20 mL  20 mL Intravenous Once PRN Blanton Kardell, Nanine Means,  MD        PAST MEDICAL HISTORY: Past Medical History:  Diagnosis Date   Allergy    Anemia    Anxiety    Arthritis    Back pain    Bell's palsy 08/2017   Depression    Depression    Phreesia 05/19/2020   GERD (gastroesophageal reflux disease)    Hepatic steatosis    Hiatal hernia    History of Bell's palsy 02/11/2018   Hyperlipidemia    Hypertension    Internal hemorrhoids    MS (multiple sclerosis) ([place])    2007   Multiple sclerosis ([place])    Multiple sclerosis ([place]) 04/04/2008   Qualifier: Diagnosis of  By: Moshe Cipro MD, Margaret     Schatzki's ring    Sleep apnea    wears CPAP    PAST SURGICAL HISTORY: Past Surgical History:  Procedure Laterality Date   ABDOMINAL HYSTERECTOMY  2005   fibroids   CARDIAC CATHETERIZATION N/A 05/04/2016   Procedure: Left Heart Cath and Coronary Angiography;  Surgeon: Jettie Booze, MD;  Location: [place] [facility];  Service: Cardiovascular;  Laterality: N/A;   COLONOSCOPY     ESOPHAGOGASTRODUODENOSCOPY N/A 09/18/2013   Procedure: ESOPHAGOGASTRODUODENOSCOPY (EGD);  Surgeon: Jerene Bears, MD;  Location: [place];  Service: Gastroenterology;  Laterality: N/A;   UPPER GASTROINTESTINAL ENDOSCOPY     WISDOM TOOTH EXTRACTION      FAMILY HISTORY: Family History  Problem Relation Age of Onset   Hypertension Mother    Hyperlipidemia Mother    Depression Mother    Hypertension Father    Hypertension Sister    High Cholesterol Sister    Healthy Son    Healthy  Daughter    Colon cancer Neg Hx    Esophageal cancer Neg Hx    Rectal cancer Neg Hx    Stomach cancer Neg Hx    Colon polyps Neg Hx     SOCIAL HISTORY:  Social History   Socioeconomic History   Marital status: Married    Spouse name: Not on file   Number of children: Not on file   Years of education: Not on file   Highest education level: Not on file  Occupational History   Not on file  Tobacco Use   Smoking status: Never   Smokeless tobacco: Never  Vaping Use   Vaping Use: Never used  Substance and Sexual Activity   Alcohol use: No   Drug use: No   Sexual activity: Yes    Birth control/protection: Surgical    Comment: hyst  Other Topics Concern   Not on file  Social History Narrative   Not on file   Social Determinants of Health   Financial Resource Strain: Low Risk    Difficulty of Paying Living Expenses: Not very hard  Food Insecurity: No Food Insecurity   Worried About Running Out of Food in the Last Year: Never true   Ran Out of Food in the Last Year: Never true  Transportation Needs: No Transportation Needs   Lack of Transportation (Medical): No   Lack of Transportation (Non-Medical): No  Physical Activity: Insufficiently Active   Days of Exercise per Week: 3 days   Minutes of Exercise per Session: 30 min  Stress: Stress Concern Present   Feeling of Stress : Very much  Social Connections: Moderately Isolated   Frequency of Communication with Friends and Family: Once a week   Frequency of Social Gatherings with Friends and Family: Once a week  Attends Religious Services: More than 4 times per year   Active Member of Clubs or Organizations: No   Attends Archivist Meetings: Never   Marital Status: Married  Human resources officer Violence: Not At Risk   Fear of Current or Ex-Partner: No   Emotionally Abused: No   Physically Abused: No   Sexually Abused: No     PHYSICAL EXAM  Vitals:   02/11/21 1104  BP: 129/82  Pulse: 82  Weight: 178 lb  (80.7 kg)  Height: 5\' 3"  (1.6 m)    Body mass index is 31.53 kg/m.   General: The patient is well-developed and well-nourished and in no acute distress.  Affect is normal.   Not fdgety today.     HEENT:  Has scar on occipial scalp from fall.   Sclera anicteric   Musculoskeletal:   She is tender over left occiput at the splenius capitis muscle and occipital nerve  Neurologic Exam  Mental status: The patient is alert and oriented x 3 at the time of the examination. The patient has apparent normal recent and remote memory, with an apparently normal attention span and concentration ability.   Speech is normal.  Cranial nerves: Extraocular movements are full.  Facial strength and sensation is normal.  Trapezius strength is normal.   Trapezius and sternocleidomastoid strength is normal. No dysarthria is noted.     Motor:  Muscle bulk is normal. Tone was normal in the arms but slightly increased in the legs, a little more on the right. Strength is  5 / 5 in all 4 extremities.   Sensory: Sensory testing is intact to touch in all 4 extremities.  Coordination: Cerebellar testing reveals good finger-nose-finger   Gait and station: The station is stable.. Gait is mildly wide and she veered a bit to the left.  Tandem gait is poor.  .   Borderline Romberg.    Reflexes: Deep tendon reflexes are brisk in the legs with spread at knees but no ankle clonus.   normal in the arms bilaterally.    Plantar response is flexor    DIAGNOSTIC DATA (LABS, IMAGING, TESTING) - I reviewed patient records, labs, notes, testing and imaging myself where available.  Lab Results  Component Value Date   WBC 6.9 02/06/2020   HGB 13.1 02/06/2020   HCT 40.6 02/06/2020   MCV 81 02/06/2020   PLT 363 02/06/2020      Component Value Date/Time   NA 140 12/11/2020 1025   K 3.8 12/11/2020 1025   CL 99 12/11/2020 1025   CO2 25 12/11/2020 1025   GLUCOSE 79 12/11/2020 1025   GLUCOSE 85 08/10/2019 1403   BUN 19  12/11/2020 1025   CREATININE 0.83 12/11/2020 1025   CREATININE 1.91 (H) 08/10/2019 1403   CALCIUM 9.7 12/11/2020 1025   PROT 7.3 12/11/2020 1025   ALBUMIN 4.7 12/11/2020 1025   AST 19 12/11/2020 1025   ALT 15 12/11/2020 1025   ALKPHOS 109 12/11/2020 1025   BILITOT 0.3 12/11/2020 1025   GFRNONAA 57 (L) 06/05/2020 1432   GFRNONAA 28 (L) 08/10/2019 1403   GFRAA 65 06/05/2020 1432   GFRAA 32 (L) 08/10/2019 1403   Lab Results  Component Value Date   CHOL 142 12/11/2020   HDL 64 12/11/2020   LDLCALC 60 12/11/2020   TRIG 98 12/11/2020   CHOLHDL 2.2 12/11/2020   Lab Results  Component Value Date   HGBA1C 5.5 05/10/2017   No results found for: [id number] Lab Results  Component Value Date   TSH 2.480 02/06/2020       ASSESSMENT AND PLAN  Post-concussion headache  Abnormal brain MRI - Plan: MR CERVICAL SPINE WO CONTRAST  Neck pain - Plan: MR CERVICAL SPINE WO CONTRAST  Gait disturbance - Plan: MR CERVICAL SPINE WO CONTRAST  Hyperreflexia - Plan: MR CERVICAL SPINE WO CONTRAST   1.   She will remain off of a disease modifying therapy as the diagnosis of MS is unlikely based on the stable nonspecific foci on MRI. 2.    For post-concussive headache add zonisamide 100 mg nightly  3.   MRI cervical spine due to gait being worse since the fall, hyper-reflexia and concern of myelopathy.   4.   Continue Cymbalta to 60 mg and Wellbutrin for depression Return in 12 months or sooner if there are new or worsening neurologic symptoms.   [place] A. Felecia Shelling, MD, PhD 38/18/4037, 54:36 AM Certified in Neurology, Clinical Neurophysiology, Sleep Medicine, Pain Medicine and Neuroimaging  [facility] [address] [place], [place] [address] [phone]

## 2021-02-12 ENCOUNTER — Encounter (HOSPITAL_COMMUNITY): Payer: Self-pay | Admitting: Physical Therapy

## 2021-02-12 ENCOUNTER — Other Ambulatory Visit: Payer: Self-pay

## 2021-02-12 ENCOUNTER — Ambulatory Visit (HOSPITAL_COMMUNITY): Payer: BC Managed Care – PPO | Admitting: Physical Therapy

## 2021-02-12 ENCOUNTER — Ambulatory Visit (HOSPITAL_COMMUNITY): Payer: BC Managed Care – PPO | Admitting: Psychiatry

## 2021-02-12 ENCOUNTER — Telehealth (HOSPITAL_COMMUNITY): Payer: Self-pay | Admitting: Psychiatry

## 2021-02-12 DIAGNOSIS — R42 Dizziness and giddiness: Secondary | ICD-10-CM | POA: Diagnosis not present

## 2021-02-12 NOTE — Therapy (Signed)
[place], [place], [address] Phone: [phone]   Fax:  [phone]  Physical Therapy Treatment  Patient Details  Name: Pamela Walters MRN: [id number] Date of Birth: 1961-01-01 Referring Provider (PT): [name] MD   Encounter Date: 02/12/2021   PT End of Session - 02/12/21 0824     Visit Number 8    Number of Visits 13    Date for PT Re-Evaluation 02/27/21    Authorization Type BCBS COMM PPO (90 VL)    Authorization - Visit Number 8    Authorization - Number of Visits 90    Progress Note Due on Visit 13    PT Start Time 0820    PT Stop Time 0900    PT Time Calculation (min) 40 min    Activity Tolerance Patient tolerated treatment well    Behavior During Therapy [facility] for tasks assessed/performed             Past Medical History:  Diagnosis Date   Allergy    Anemia    Anxiety    Arthritis    Back pain    Bell's palsy 08/2017   Depression    Depression    Phreesia 05/19/2020   GERD (gastroesophageal reflux disease)    Hepatic steatosis    Hiatal hernia    History of Bell's palsy 02/11/2018   Hyperlipidemia    Hypertension    Internal hemorrhoids    MS (multiple sclerosis) ([place])    2007   Multiple sclerosis ([place])    Multiple sclerosis ([place]) 04/04/2008   Qualifier: Diagnosis of  By: Moshe Cipro MD, Margaret     Schatzki's ring    Sleep apnea    wears CPAP    Past Surgical History:  Procedure Laterality Date   ABDOMINAL HYSTERECTOMY  2005   fibroids   CARDIAC CATHETERIZATION N/A 05/04/2016   Procedure: Left Heart Cath and Coronary Angiography;  Surgeon: Jettie Booze, MD;  Location: [place] [facility];  Service: Cardiovascular;  Laterality: N/A;   COLONOSCOPY     ESOPHAGOGASTRODUODENOSCOPY N/A 09/18/2013   Procedure: ESOPHAGOGASTRODUODENOSCOPY (EGD);  Surgeon: Jerene Bears, MD;  Location: [place];  Service: Gastroenterology;  Laterality: N/A;   UPPER GASTROINTESTINAL  ENDOSCOPY     WISDOM TOOTH EXTRACTION      There were no vitals filed for this visit.   Subjective Assessment - 02/12/21 0824     Subjective Patient states "the struggle is real". Dizziness is better, almost gone. Has a bad headache this morning.    Pertinent History [place] and hit her head    Limitations Standing;Walking;House hold activities    Currently in Pain? Yes    Pain Score 6     Pain Location Head    Pain Orientation Posterior    Pain Descriptors / Indicators Aching    Pain Onset 1 to 4 weeks ago                               [facility] Adult PT Treatment/Exercise - 02/12/21 0001       Neck Exercises: Seated   Neck Retraction 15 reps;5 secs    Neck Retraction Limitations with OP    Other Seated Exercise scapular retraction 15 x 5", UT and levator stretch 3 x 30" each      Manual Therapy   Manual Therapy Soft tissue mobilization;Myofascial release;Manual Traction    Manual therapy  comments done seperate from all other aspects of treatment    Soft tissue mobilization STM to bilateral sub occipitals and bilateral cervical paraspinals    Myofascial Release suboccipital release    Manual Traction manual cervical traction                       PT Short Term Goals - 01/30/21 1030       PT SHORT TERM GOAL #1   Title Patient will be independent with initial HEP and self-management strategies to improve functional outcomes    Baseline Reports compliance    Time 2    Period Weeks    Status On-going    Target Date 01/20/21               PT Long Term Goals - 01/30/21 1030       PT LONG TERM GOAL #1   Title Patient will improve FOTO score to predicted value to indicate improvement in functional outcomes    Baseline improved 2%    Time 4    Period Weeks    Status On-going      PT LONG TERM GOAL #2   Title Patient will report at least 80% overall improvement in subjective complaint to indicate improvement in ability to perform  ADLs.    Baseline reports 75% improved    Time 4    Period Weeks    Status On-going      PT LONG TERM GOAL #3   Title Patient will report no vertigo with position changes in and out of bed for improved sleep quality, function, QOL    Baseline Partial improvement, less frequent but ongoing    Time 4    Period Weeks    Status On-going                   Plan - 02/12/21 0859     Clinical Impression Statement Patient continues to be limited by pain and fascial restriction in cervical area. Focused session on manual treatment of target area and stretching to reduce pain and restriction/ headaches. Patient reports improving symptoms from 6/10 to 4/10. Showing fairly good return with prior ther ex, minimal cues required for proper form with neck stretching. Patient will continue to benefit from skilled therapy services to progress balance activity, and reduce neck pain for improved ability to perform ADLs.    Examination-Activity Limitations Locomotion Level;Transfers;Sleep;Caring for Others;Other;Lift    Examination-Participation Restrictions Occupation;Community Activity;Laundry;Yard Work;Cleaning;Shop    Stability/Clinical Decision Making Stable/Uncomplicated    Rehab Potential Good    PT Frequency 2x / week    PT Duration 4 weeks    PT Treatment/Interventions ADLs/Self Care Home Management;Moist Heat;Traction;Biofeedback;Canalith Repostioning;Parrafin;Therapeutic activities;Patient/family education;Compression bandaging;Energy conservation;Splinting;Taping;Vasopneumatic Device;Joint Manipulations;Spinal Manipulations;Passive range of motion;Dry needling;Manual techniques;Cognitive remediation;Neuromuscular re-education;Balance training;Vestibular;Visual/perceptual remediation/compensation;Iontophoresis 4mg /ml Dexamethasone;DME Instruction;Gait training;Stair training;Ultrasound;Functional mobility training;Fluidtherapy;Electrical Stimulation;Cryotherapy;Contrast Bath;Therapeutic exercise     PT Next Visit Plan Assess response to manual. Progress postural strength to bands, thoracic mobility. Resume balance progressions as able    PT Home Exercise Plan Eval: Saccades; 9/15: smooth pursuits ,  cervical isometrics. 9/20 forward leans (seated) 10/17 upper trap and levator stretch    Consulted and Agree with Plan of Care Patient             Patient will benefit from skilled therapeutic intervention in order to improve the following deficits and impairments:  Dizziness, Decreased balance, Decreased activity tolerance, Impaired vision/preception  Visit Diagnosis: Dizziness and giddiness  Problem List Patient Active Problem List   Diagnosis Date Noted   Gait disturbance 02/11/2021   Hyperreflexia 02/11/2021   Change in multiple pigmented skin lesions 01/14/2021   Scalp hematoma 12/23/2020   Acute hip pain, left 12/11/2020   Vertigo 12/11/2020   New daily persistent headache (ndph) 06/16/2020   Head trauma 06/10/2020   Hemorrhoids 06/05/2020   S/P hysterectomy 06/05/2020   Other headache syndrome 09/13/2019   Fidgeting 09/13/2019   Dysphagia 06/24/2019   History of 2019 novel coronavirus disease (COVID-19) 06/20/2019   Knee pain 01/30/2019   Recurrent cold sores 03/11/2018   Dyspepsia 03/07/2018   History of multiple sclerosis ([place]) 02/11/2018   Neck pain 02/11/2018   History of Bell's palsy 02/11/2018   Abnormal brain MRI 05/19/2017   Carpal tunnel syndrome, bilateral 05/19/2017   Abnormal stress test    Allergic rhinitis 06/20/2015   Dysesthesia 03/04/2015   OSA (obstructive sleep apnea) 10/01/2014   Increased homocysteine 10/01/2014   Urinary incontinence 05/30/2014   Back pain with radiation 02/05/2013   DISORDER OF BONE AND CARTILAGE UNSPECIFIED 12/16/2009   Overweight (BMI 25.0-29.9) 04/08/2008   Depression with anxiety 12/18/2007   Post-concussion headache 12/18/2007   Hyperlipidemia LDL goal <100 12/16/2007   Essential hypertension 12/16/2007    9:02 AM, 02/12/21 Josue Hector PT DPT  Physical Therapist with [facility]  [phone] [place], [place], [address] Phone: [phone]   Fax:  [phone]  Name: Pamela Walters MRN: [id number] Date of Birth: 10-May-1960

## 2021-02-12 NOTE — Telephone Encounter (Signed)
Therapist attempted to contact patient twice via text through caregility platform, no response.  Therapist called patient, left message indicating attempt, and requesting patient call office.

## 2021-02-13 DIAGNOSIS — R42 Dizziness and giddiness: Secondary | ICD-10-CM | POA: Diagnosis not present

## 2021-02-13 DIAGNOSIS — R2689 Other abnormalities of gait and mobility: Secondary | ICD-10-CM | POA: Diagnosis not present

## 2021-02-13 DIAGNOSIS — M542 Cervicalgia: Secondary | ICD-10-CM | POA: Diagnosis not present

## 2021-02-18 ENCOUNTER — Ambulatory Visit (HOSPITAL_COMMUNITY): Payer: BC Managed Care – PPO | Admitting: Physical Therapy

## 2021-02-19 ENCOUNTER — Telehealth (HOSPITAL_COMMUNITY): Payer: Self-pay | Admitting: Physical Therapy

## 2021-02-19 ENCOUNTER — Other Ambulatory Visit: Payer: Self-pay

## 2021-02-19 ENCOUNTER — Encounter (HOSPITAL_COMMUNITY): Payer: Self-pay | Admitting: Psychiatry

## 2021-02-19 ENCOUNTER — Telehealth (INDEPENDENT_AMBULATORY_CARE_PROVIDER_SITE_OTHER): Payer: BC Managed Care – PPO | Admitting: Psychiatry

## 2021-02-19 DIAGNOSIS — F331 Major depressive disorder, recurrent, moderate: Secondary | ICD-10-CM

## 2021-02-19 MED ORDER — CLONAZEPAM 0.5 MG PO TABS: 0.5000 mg | ORAL_TABLET | Freq: Two times a day (BID) | ORAL | 2 refills | Status: DC | PRN

## 2021-02-19 MED ORDER — BUPROPION HCL ER (XL) 300 MG PO TB24: 300.0000 mg | ORAL_TABLET | ORAL | 2 refills | Status: DC

## 2021-02-19 MED ORDER — PRAZOSIN HCL 1 MG PO CAPS: 1.0000 mg | ORAL_CAPSULE | Freq: Every day | ORAL | 3 refills | Status: DC

## 2021-02-19 MED ORDER — DULOXETINE HCL 60 MG PO CPEP: 120.0000 mg | ORAL_CAPSULE | Freq: Every day | ORAL | 3 refills | Status: DC

## 2021-02-19 NOTE — Telephone Encounter (Signed)
Her car is still in the shop and she had to cx.

## 2021-02-19 NOTE — Progress Notes (Signed)
Virtual Visit via Video Note  I connected with Pamela Walters on 02/19/21 at 10:40 AM EDT by a video enabled telemedicine application and verified that I am speaking with the correct person using two identifiers.  Location: Patient: home Provider: office   I discussed the limitations of evaluation and management by telemedicine and the availability of in person appointments. The patient expressed understanding and agreed to proceed.     I discussed the assessment and treatment plan with the patient. The patient was provided an opportunity to ask questions and all were answered. The patient agreed with the plan and demonstrated an understanding of the instructions.   The patient was advised to call back or seek an in-person evaluation if the symptoms worsen or if the condition fails to improve as anticipated.  I provided 12 minutes of non-face-to-face time during this encounter.   [name], MD  [facility] MD/PA/NP OP Progress Note  02/19/2021 10:54 AM Pamela Walters  MRN:  [id number]  Chief Complaint:  Chief Complaint   Depression; Anxiety; Follow-up    HPI: This patient is a 60 year old married white female who lives with her husband in [place].  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  Last time she told me she had fallen on August 1 and hit her head on concrete.  She is still having sequelae from this such as dizziness loss of balance significant headaches and some memory loss.  It is slowly getting better with physical therapy.  The headaches are very difficult for her as they happen daily and never really go away.  She is on Zonegram now and also takes Ultram.  Last time the patient seems somewhat more depressed regarding all of this side increased her Wellbutrin XL to 300 mg.  She is not sure if this is helped her she is just slowly getting better.  She is discouraged with her inability to do things but she is starting  to do more than she had in the past.  She denies any thoughts of self-harm or suicidal ideation.  She has had to increase her Klonopin to 0.5 mg twice daily because of her anxiety.  It is also difficult for her to be out of work due to the injury and to be at home all the time alone.  Nevertheless she seems to be making progress and would like to continue on her current regimen.   Visit Diagnosis:    ICD-10-CM   1. Major depressive disorder, recurrent episode, moderate ([place])  F33.1       Past Psychiatric History: none  Past Medical History:  Past Medical History:  Diagnosis Date   Allergy    Anemia    Anxiety    Arthritis    Back pain    Bell's palsy 08/2017   Depression    Depression    Phreesia 05/19/2020   GERD (gastroesophageal reflux disease)    Hepatic steatosis    Hiatal hernia    History of Bell's palsy 02/11/2018   Hyperlipidemia    Hypertension    Internal hemorrhoids    MS (multiple sclerosis) ([place])    2007   Multiple sclerosis ([place])    Multiple sclerosis ([place]) 04/04/2008   Qualifier: Diagnosis of  By: Moshe Cipro MD, Margaret     Schatzki's ring    Sleep apnea    wears CPAP    Past Surgical History:  Procedure Laterality Date  ABDOMINAL HYSTERECTOMY  2005   fibroids   CARDIAC CATHETERIZATION N/A 05/04/2016   Procedure: Left Heart Cath and Coronary Angiography;  Surgeon: Jettie Booze, MD;  Location: [facility];  Service: Cardiovascular;  Laterality: N/A;   COLONOSCOPY     ESOPHAGOGASTRODUODENOSCOPY N/A 09/18/2013   Procedure: ESOPHAGOGASTRODUODENOSCOPY (EGD);  Surgeon: Jerene Bears, MD;  Location: [facility];  Service: Gastroenterology;  Laterality: N/A;   UPPER GASTROINTESTINAL ENDOSCOPY     WISDOM TOOTH EXTRACTION      Family Psychiatric History: see below  Family History:  Family History  Problem Relation Age of Onset   Hypertension Mother    Hyperlipidemia Mother    Depression Mother    Hypertension Father    Hypertension Sister     High Cholesterol Sister    Healthy Son    Healthy Daughter    Colon cancer Neg Hx    Esophageal cancer Neg Hx    Rectal cancer Neg Hx    Stomach cancer Neg Hx    Colon polyps Neg Hx     Social History:  Social History   Socioeconomic History   Marital status: Married    Spouse name: Not on file   Number of children: Not on file   Years of education: Not on file   Highest education level: Not on file  Occupational History   Not on file  Tobacco Use   Smoking status: Never   Smokeless tobacco: Never  Vaping Use   Vaping Use: Never used  Substance and Sexual Activity   Alcohol use: No   Drug use: No   Sexual activity: Yes    Birth control/protection: Surgical    Comment: hyst  Other Topics Concern   Not on file  Social History Narrative   Not on file   Social Determinants of Health   Financial Resource Strain: Low Risk    Difficulty of Paying Living Expenses: Not very hard  Food Insecurity: No Food Insecurity   Worried About Running Out of Food in the Last Year: Never true   Ran Out of Food in the Last Year: Never true  Transportation Needs: No Transportation Needs   Lack of Transportation (Medical): No   Lack of Transportation (Non-Medical): No  Physical Activity: Insufficiently Active   Days of Exercise per Week: 3 days   Minutes of Exercise per Session: 30 min  Stress: Stress Concern Present   Feeling of Stress : Very much  Social Connections: Moderately Isolated   Frequency of Communication with Friends and Family: Once a week   Frequency of Social Gatherings with Friends and Family: Once a week   Attends Religious Services: More than 4 times per year   Active Member of Clubs or Organizations: No   Attends Archivist Meetings: Never   Marital Status: Married    Allergies: No Known Allergies  Metabolic Disorder Labs: Lab Results  Component Value Date   HGBA1C 5.5 05/10/2017   MPG 111 05/10/2017   MPG 108 02/20/2016   No results found  for: PROLACTIN Lab Results  Component Value Date   CHOL 142 12/11/2020   TRIG 98 12/11/2020   HDL 64 12/11/2020   CHOLHDL 2.2 12/11/2020   VLDL 34 (H) 06/30/2016   LDLCALC 60 12/11/2020   LDLCALC 179 (H) 06/05/2020   Lab Results  Component Value Date   TSH 2.480 02/06/2020   TSH 2.36 01/30/2019    Therapeutic Level Labs: No results found for: LITHIUM No results found  for: VALPROATE No components found for:  CBMZ  Current Medications: Current Outpatient Medications  Medication Sig Dispense Refill   ALPRAZolam (XANAX) 0.5 MG tablet Take 2 or 3 po before the MRI 3 tablet 0   amLODipine (NORVASC) 5 MG tablet Take 1 tablet (5 mg total) by mouth daily. 90 tablet 3   aspirin 81 MG tablet Take 81 mg by mouth daily.     buPROPion (WELLBUTRIN XL) 300 MG 24 hr tablet Take 1 tablet (300 mg total) by mouth every morning. 30 tablet 2   clonazePAM (KLONOPIN) 0.5 MG tablet Take 1 tablet (0.5 mg total) by mouth 2 (two) times daily as needed for anxiety. 60 tablet 2   DULoxetine (CYMBALTA) 60 MG capsule Take 2 capsules (120 mg total) by mouth daily. 180 capsule 3   ezetimibe (ZETIA) 10 MG tablet TAKE 1 TABLET BY MOUTH  DAILY 90 tablet 3   gabapentin (NEURONTIN) 600 MG tablet Take 1 tablet (600 mg total) by mouth 3 (three) times daily. 270 tablet 3   meclizine (ANTIVERT) 12.5 MG tablet Take 1 tablet (12.5 mg total) by mouth 3 (three) times daily as needed for dizziness. 30 tablet 0   metaxalone (SKELAXIN) 800 MG tablet Take one tablet by mouth two times daily for  Muscle spasm 60 tablet 5   mometasone (NASONEX) 50 MCG/ACT nasal spray USE 2 SPRAYS NASALLY DAILY 51 g 1   Multiple Vitamin (MULTIVITAMIN WITH MINERALS) TABS tablet Take 1 tablet by mouth daily.     MYRBETRIQ 25 MG TB24 tablet TAKE 1 TABLET BY MOUTH  DAILY 90 tablet 3   prazosin (MINIPRESS) 1 MG capsule Take 1 capsule (1 mg total) by mouth at bedtime. 90 capsule 3   rosuvastatin (CRESTOR) 40 MG tablet TAKE 1 TABLET BY MOUTH  DAILY 90  tablet 3   traMADol (ULTRAM) 50 MG tablet TAKE 1 TO 2 TABLETS BY  MOUTH UP TO 4 PILLS DAILY  AS NEEDED , BUT NO MORE THAN 90 TABLETS PER MONTH 270 tablet 1   zonisamide (ZONEGRAN) 100 MG capsule Take 1 capsule (100 mg total) by mouth daily. 30 capsule 11   No current facility-administered medications for this visit.   Facility-Administered Medications Ordered in Other Visits  Medication Dose Route Frequency Provider Last Rate Last Admin   gadopentetate dimeglumine (MAGNEVIST) injection 20 mL  20 mL Intravenous Once PRN Sater, Nanine Means, MD         Musculoskeletal: Strength & Muscle Tone: decreased Gait & Station: unsteady Patient leans: N/A  Psychiatric Specialty Exam: Review of Systems  Constitutional:  Positive for fatigue.  Neurological:  Positive for dizziness and headaches.  Psychiatric/Behavioral:  The patient is nervous/anxious.   All other systems reviewed and are negative.  There were no vitals taken for this visit.There is no height or weight on file to calculate BMI.  General Appearance: Casual and Fairly Groomed  Eye Contact:  Good  Speech:  Clear and Coherent  Volume:  Normal  Mood:  Anxious  Affect:  Appropriate and Congruent  Thought Process:  Goal Directed  Orientation:  Full (Time, Place, and Person)  Thought Content: WDL   Suicidal Thoughts:  No  Homicidal Thoughts:  No  Memory:  Immediate;   Good Recent;   Fair Remote;   Fair  Judgement:  Good  Insight:  Good  Psychomotor Activity:  Decreased  Concentration:  Concentration: Fair and Attention Span: Fair  Recall:  Good  Fund of Knowledge: Good  Language: Good  Akathisia:  No  Handed:  Right  AIMS (if indicated): not done  Assets:  Communication Skills Desire for Improvement Resilience Social Support Talents/Skills  ADL's:  Intact  Cognition: WNL  Sleep:  Good   Screenings: GAD-7    Flowsheet Row Office Visit from 06/05/2020 in [facility]  Total GAD-7 Score 14      PHQ2-9     Flowsheet Row Video Visit from 02/19/2021 in [facility] Video Visit from 01/23/2021 in [facility] Office Visit from 01/10/2021 in [facility] Office Visit from 12/11/2020 in [facility] Video Visit from 10/11/2020 in [facility]  PHQ-2 Total Score 1 6 0 0 0  PHQ-9 Total Score -- 17 -- -- --      Flowsheet Row Video Visit from 02/19/2021 in [facility] Video Visit from 01/23/2021 in [facility] Video Visit from 10/11/2020 in [facility] No Risk No Risk No Risk        Assessment and Plan: This patient is a 60 year old female with a history of depression anxiety chronic fatigue and fibromyalgia.  The recent head injury has set her back in terms of feeling frustrated and alone.  She does think the medications help and she has been taking the clonazepam twice a day at 0.5 mg and I think this is reasonable to continue.  She will continue Wellbutrin XL 300 mg daily and Cymbalta 60 mg twice daily for depression and prazosin 1 mg at bedtime as it is helped with nightmares.  She will return to see me in 2 months   [name], MD 02/19/2021, 10:54 AM

## 2021-02-20 ENCOUNTER — Ambulatory Visit (HOSPITAL_COMMUNITY): Payer: BC Managed Care – PPO | Admitting: Physical Therapy

## 2021-02-24 ENCOUNTER — Other Ambulatory Visit: Payer: Self-pay | Admitting: Neurology

## 2021-02-24 DIAGNOSIS — G44309 Post-traumatic headache, unspecified, not intractable: Secondary | ICD-10-CM

## 2021-02-24 DIAGNOSIS — R9089 Other abnormal findings on diagnostic imaging of central nervous system: Secondary | ICD-10-CM

## 2021-02-24 DIAGNOSIS — M542 Cervicalgia: Secondary | ICD-10-CM

## 2021-02-25 ENCOUNTER — Ambulatory Visit (HOSPITAL_COMMUNITY): Payer: BC Managed Care – PPO | Attending: Family Medicine | Admitting: Physical Therapy

## 2021-02-25 ENCOUNTER — Encounter (HOSPITAL_COMMUNITY): Payer: Self-pay | Admitting: Physical Therapy

## 2021-02-25 ENCOUNTER — Telehealth: Payer: Self-pay | Admitting: Neurology

## 2021-02-25 ENCOUNTER — Other Ambulatory Visit: Payer: Self-pay

## 2021-02-25 DIAGNOSIS — R42 Dizziness and giddiness: Secondary | ICD-10-CM | POA: Diagnosis not present

## 2021-02-25 NOTE — Telephone Encounter (Signed)
BCBS Josem Kaufmann: [id number] (exp. 02/25/21 to 03/26/21) order sent to [facility] they will reach out to the patient to schedule.

## 2021-02-25 NOTE — Telephone Encounter (Signed)
Probably not I will obtain the authorization and send the order to [facility] and they will have to reach out to the patient to schedule. She may have to reschedule for a different day to have both exams done since the MRI Brain was enter on a later day.

## 2021-02-25 NOTE — Patient Instructions (Signed)
Access Code: [email or web address] URL: [email or web address] Date: 02/25/2021 Prepared by: Josue Hector  Exercises Standing with Head Rotation - 2-3 x daily - 7 x weekly - 2 sets - 10 reps

## 2021-02-25 NOTE — Therapy (Signed)
[place], [place], [address] Phone: [phone]   Fax:  [phone]  Physical Therapy Treatment  Patient Details  Name: Pamela Walters MRN: [id number] Date of Birth: 1960-06-08 Referring Provider (PT): [name] MD   Encounter Date: 02/25/2021   PT End of Session - 02/25/21 0956     Visit Number 9    Number of Visits 13    Date for PT Re-Evaluation 02/27/21    Authorization Type BCBS COMM PPO (90 VL)    Authorization - Visit Number 9    Authorization - Number of Visits 90    Progress Note Due on Visit 71    PT Start Time 0950    PT Stop Time 1030    PT Time Calculation (min) 40 min    Activity Tolerance Patient tolerated treatment well    Behavior During Therapy [facility] for tasks assessed/performed             Past Medical History:  Diagnosis Date   Allergy    Anemia    Anxiety    Arthritis    Back pain    Bell's palsy 08/2017   Depression    Depression    Phreesia 05/19/2020   GERD (gastroesophageal reflux disease)    Hepatic steatosis    Hiatal hernia    History of Bell's palsy 02/11/2018   Hyperlipidemia    Hypertension    Internal hemorrhoids    MS (multiple sclerosis) ([place])    2007   Multiple sclerosis ([place])    Multiple sclerosis ([place]) 04/04/2008   Qualifier: Diagnosis of  By: Moshe Cipro MD, Margaret     Schatzki's ring    Sleep apnea    wears CPAP    Past Surgical History:  Procedure Laterality Date   ABDOMINAL HYSTERECTOMY  2005   fibroids   CARDIAC CATHETERIZATION N/A 05/04/2016   Procedure: Left Heart Cath and Coronary Angiography;  Surgeon: Jettie Booze, MD;  Location: [facility];  Service: Cardiovascular;  Laterality: N/A;   COLONOSCOPY     ESOPHAGOGASTRODUODENOSCOPY N/A 09/18/2013   Procedure: ESOPHAGOGASTRODUODENOSCOPY (EGD);  Surgeon: Jerene Bears, MD;  Location: [facility];  Service: Gastroenterology;  Laterality: N/A;   UPPER GASTROINTESTINAL  ENDOSCOPY     WISDOM TOOTH EXTRACTION      There were no vitals filed for this visit.   Subjective Assessment - 02/25/21 0954     Subjective Has been doing exercises. She was unable to attend last week due to car troubles. She is having neck pain today. She was doing well up until this point. She has MRI scheduled this week.    Pertinent History [place] and hit her head    Limitations Standing;Walking;House hold activities    Currently in Pain? Yes    Pain Score 6     Pain Location Head    Pain Orientation Posterior    Pain Descriptors / Indicators Aching    Pain Type Acute pain    Pain Onset 1 to 4 weeks ago                               [facility] Adult PT Treatment/Exercise - 02/25/21 0001       Neck Exercises: Standing   Other Standing Exercises semi tandem stance 2 x 30" each      Neck Exercises: Seated   Neck Retraction 10 reps    Neck  Retraction Limitations with OP    Other Seated Exercise shoulder rolls x20, UT stretch 2 x 30"      Manual Therapy   Manual Therapy Soft tissue mobilization;Myofascial release;Manual Traction    Manual therapy comments done seperate from all other aspects of treatment    Soft tissue mobilization STM to bilateral sub occipitals and bilateral cervical paraspinals    Myofascial Release suboccipital release    Manual Traction manual cervical traction             Vestibular Treatment/Exercise - 02/25/21 0001       Eye/Head Exercise Horizontal   Comments standing head turns with NBOS 2 x 10      Eye/Head Exercise Vertical   Comments standing head nods NBOS 2 x 10                      PT Short Term Goals - 01/30/21 1030       PT SHORT TERM GOAL #1   Title Patient will be independent with initial HEP and self-management strategies to improve functional outcomes    Baseline Reports compliance    Time 2    Period Weeks    Status On-going    Target Date 01/20/21               PT Long Term Goals  - 01/30/21 1030       PT LONG TERM GOAL #1   Title Patient will improve FOTO score to predicted value to indicate improvement in functional outcomes    Baseline improved 2%    Time 4    Period Weeks    Status On-going      PT LONG TERM GOAL #2   Title Patient will report at least 80% overall improvement in subjective complaint to indicate improvement in ability to perform ADLs.    Baseline reports 75% improved    Time 4    Period Weeks    Status On-going      PT LONG TERM GOAL #3   Title Patient will report no vertigo with position changes in and out of bed for improved sleep quality, function, QOL    Baseline Partial improvement, less frequent but ongoing    Time 4    Period Weeks    Status On-going                   Plan - 02/25/21 1029     Clinical Impression Statement Patient continues to be challenged with balance activity. Some improvement in tandem static balance. Patient was well challenged with added head turns in NBOS, and demos continued LOB toward LT side. Improved with tactile cues. Patient noting improved pain/ HA symptoms with targeted manual therapy to address pain area and restriction. Updated HEP and issued handout. Reassess next visits. Will adjust or modify POC as indicated.    Examination-Activity Limitations Locomotion Level;Transfers;Sleep;Caring for Others;Other;Lift    Examination-Participation Restrictions Occupation;Community Activity;Laundry;Yard Work;Cleaning;Shop    Stability/Clinical Decision Making Stable/Uncomplicated    Rehab Potential Good    PT Frequency 2x / week    PT Duration 4 weeks    PT Treatment/Interventions ADLs/Self Care Home Management;Moist Heat;Traction;Biofeedback;Canalith Repostioning;Parrafin;Therapeutic activities;Patient/family education;Compression bandaging;Energy conservation;Splinting;Taping;Vasopneumatic Device;Joint Manipulations;Spinal Manipulations;Passive range of motion;Dry needling;Manual techniques;Cognitive  remediation;Neuromuscular re-education;Balance training;Vestibular;Visual/perceptual remediation/compensation;Iontophoresis 4mg /ml Dexamethasone;DME Instruction;Gait training;Stair training;Ultrasound;Functional mobility training;Fluidtherapy;Electrical Stimulation;Cryotherapy;Contrast Bath;Therapeutic exercise    PT Next Visit Plan Reassess next visit adjust or modify POC as indicated.    PT Home Exercise Plan Eval:  Saccades; 9/15: smooth pursuits ,  cervical isometrics. 9/20 forward leans (seated) 10/17 upper trap and levator stretch 11/1 standing head turns in corner    Consulted and Agree with Plan of Care Patient             Patient will benefit from skilled therapeutic intervention in order to improve the following deficits and impairments:  Dizziness, Decreased balance, Decreased activity tolerance, Impaired vision/preception  Visit Diagnosis: Dizziness and giddiness     Problem List Patient Active Problem List   Diagnosis Date Noted   Gait disturbance 02/11/2021   Hyperreflexia 02/11/2021   Change in multiple pigmented skin lesions 01/14/2021   Scalp hematoma 12/23/2020   Acute hip pain, left 12/11/2020   Vertigo 12/11/2020   New daily persistent headache (ndph) 06/16/2020   Head trauma 06/10/2020   Hemorrhoids 06/05/2020   S/P hysterectomy 06/05/2020   Other headache syndrome 09/13/2019   Fidgeting 09/13/2019   Dysphagia 06/24/2019   History of 2019 novel coronavirus disease (COVID-19) 06/20/2019   Knee pain 01/30/2019   Recurrent cold sores 03/11/2018   Dyspepsia 03/07/2018   History of multiple sclerosis ([place]) 02/11/2018   Neck pain 02/11/2018   History of Bell's palsy 02/11/2018   Abnormal brain MRI 05/19/2017   Carpal tunnel syndrome, bilateral 05/19/2017   Abnormal stress test    Allergic rhinitis 06/20/2015   Dysesthesia 03/04/2015   OSA (obstructive sleep apnea) 10/01/2014   Increased homocysteine 10/01/2014   Urinary incontinence 05/30/2014   Back  pain with radiation 02/05/2013   DISORDER OF BONE AND CARTILAGE UNSPECIFIED 12/16/2009   Overweight (BMI 25.0-29.9) 04/08/2008   Depression with anxiety 12/18/2007   Post-concussion headache 12/18/2007   Hyperlipidemia LDL goal <100 12/16/2007   Essential hypertension 12/16/2007   10:34 AM, 02/25/21 Josue Hector PT DPT  Physical Therapist with [facility]  [phone] [place] [address] [place], [place], [address] Phone: [phone]   Fax:  [phone]  Name: Pamela Walters MRN: [id number] Date of Birth: 29-Mar-1961

## 2021-02-26 ENCOUNTER — Ambulatory Visit (INDEPENDENT_AMBULATORY_CARE_PROVIDER_SITE_OTHER): Payer: BC Managed Care – PPO | Admitting: Psychiatry

## 2021-02-26 DIAGNOSIS — F331 Major depressive disorder, recurrent, moderate: Secondary | ICD-10-CM | POA: Diagnosis not present

## 2021-02-26 NOTE — Progress Notes (Signed)
Virtual Visit via Video Note  I connected with [name] on 02/26/21 at 11:09 AM EDT by a video enabled telemedicine application and verified that I am speaking with the correct person using two identifiers.  Location: Patient: Home Provider: [place] office    I discussed the limitations of evaluation and management by telemedicine and the availability of in person appointments. The patient expressed understanding and agreed to proceed.   I provided 53 minutes of non-face-to-face time during this encounter.   Alonza Smoker, LCSW                    THERAPIST PROGRESS NOTE             Session Time:  Wednesday 02/26/2021 11:09 AM - 12:02 PM   Participation Level: Active  Behavioral Response: Casual/ alert, anxious, depressed           Type of Therapy: Individual Therapy        Treatment Goals: Elevate mood and show evidence of usual energy, activities, and socialization level , resolve conflicted  feelings and adapt to new life circumstances AEB by patient implementing 3 new activities that increase a level of satisfaction consistently for 3 weeks patient identifying 5 advantages of current life situation                                                 Interventions: Supportive,  CBT  Summary: Pamela Walters is a 60 y.o. female who is referred for services by PCP Dr. Moshe Cipro due to patient suffering symptoms of depression. Patient has been seen in this office interminttently for medication management and outpatient psychotherapy since 2016. She is resuming services  due to increased stress related to family conflict and her job.  Patient's reports increased irritability, decreased interest in activities, excessive worry, poor concentration, tearfulness, fatigue, restlessness, loss of appetite, and poor motivation.       Patient last was seen by virtual visit via video 2 weeks ago.  She reports improved mood and increased behavioral activation.  She has  been using daily planning and has been engaging in  activities such as going outside walking, doing Christmas cars, Halloween  treat bags, light household tasks, and cooking.  She also reports recently enjoying going to the beach to see her granddaughter.  She also enjoyed going out with family to celebrate her husband's birthday this past weekend.  However, she expresses frustration as she is experiencing chronic headaches as well as continued memory difficulty.  She is relieved to know these are common symptoms related to her head injury and is hopeful about these issues being temporary.  She is scheduled for an upcoming MRI.  Patient does state starting to feel down because she always has a headache.     Suicidal/Homicidal: No  Therapist Response:  Reviewed symptoms, praised and reinforced patient's increased behavioral activation, discussed effects, developed plan with patient to continue using daily planning , discussed stressors, facilitated expression of thoughts and feelings, validated feelings, assisted patient develop pain/activity level chart to help patient increase confidence in coping with pain, developed plan with patient to use chart, discussed acceptance  Plan: Return again in 2 weeks.            Diagnosis: Axis I: Major Depressive Disorder, Recurrent, Moderate    Axis II: No  diagnosis    Alonza Smoker, LCSW 02/26/2021

## 2021-02-27 ENCOUNTER — Encounter (HOSPITAL_COMMUNITY): Payer: Self-pay | Admitting: Physical Therapy

## 2021-02-27 ENCOUNTER — Other Ambulatory Visit: Payer: Self-pay

## 2021-02-27 ENCOUNTER — Ambulatory Visit (HOSPITAL_COMMUNITY): Payer: BC Managed Care – PPO | Admitting: Physical Therapy

## 2021-02-27 DIAGNOSIS — R42 Dizziness and giddiness: Secondary | ICD-10-CM

## 2021-02-27 DIAGNOSIS — R2689 Other abnormalities of gait and mobility: Secondary | ICD-10-CM | POA: Diagnosis not present

## 2021-02-27 DIAGNOSIS — M542 Cervicalgia: Secondary | ICD-10-CM | POA: Diagnosis not present

## 2021-02-27 NOTE — Therapy (Signed)
[facility] [address] [place], [place], [address] Phone: [phone]   Fax:  [phone]  Physical Therapy Treatment  Patient Details  Name: Pamela Walters MRN: [id number] Date of Birth: December 29, 1960 Referring Provider (PT): [name] MD  PHYSICAL THERAPY DISCHARGE SUMMARY  Visits from Start of Care: 10  Current functional level related to goals / functional outcomes: See below   Remaining deficits: See below    Education / Equipment: See assessment    Patient agrees to discharge. Patient goals were met. Patient is being discharged due to meeting the stated rehab goals.   Encounter Date: 02/27/2021   PT End of Session - 02/27/21 1000     Visit Number 10    Number of Visits 13    Date for PT Re-Evaluation 02/27/21    Authorization Type [employer] COMM PPO (90 VL)    Authorization - Visit Number 10    Authorization - Number of Visits 90    Progress Note Due on Visit 13    PT Start Time 2229    PT Stop Time 1032    PT Time Calculation (min) 38 min    Activity Tolerance Patient tolerated treatment well    Behavior During Therapy [facility] for tasks assessed/performed             Past Medical History:  Diagnosis Date   Allergy    Anemia    Anxiety    Arthritis    Back pain    Bell's palsy 08/2017   Depression    Depression    Phreesia 05/19/2020   GERD (gastroesophageal reflux disease)    Hepatic steatosis    Hiatal hernia    History of Bell's palsy 02/11/2018   Hyperlipidemia    Hypertension    Internal hemorrhoids    MS (multiple sclerosis) ([place])    2007   Multiple sclerosis ([place])    Multiple sclerosis ([place]) 04/04/2008   Qualifier: Diagnosis of  By: Moshe Cipro MD, Margaret     Schatzki's ring    Sleep apnea    wears CPAP    Past Surgical History:  Procedure Laterality Date   ABDOMINAL HYSTERECTOMY  2005   fibroids   CARDIAC CATHETERIZATION N/A 05/04/2016   Procedure: Left Heart Cath and Coronary  Angiography;  Surgeon: Jettie Booze, MD;  Location: [facility];  Service: Cardiovascular;  Laterality: N/A;   COLONOSCOPY     ESOPHAGOGASTRODUODENOSCOPY N/A 09/18/2013   Procedure: ESOPHAGOGASTRODUODENOSCOPY (EGD);  Surgeon: Jerene Bears, MD;  Location: [facility];  Service: Gastroenterology;  Laterality: N/A;   UPPER GASTROINTESTINAL ENDOSCOPY     WISDOM TOOTH EXTRACTION      There were no vitals filed for this visit.   Subjective Assessment - 02/27/21 0959     Subjective Patient says she is doing better overall. She has not had positional vertigo getting in and out of bed in several weeks. She is still having some dizziness with quick movements and bending down fast to pick up dropped items. This usually only lasts a few seconds. Patient reports 90% improvement in symptoms since starting therapy.    Pertinent History [place] and hit her head    Limitations Standing;Walking;House hold activities    Currently in Pain? Yes    Pain Score 5     Pain Location Head    Pain Descriptors / Indicators Aching;Sore    Pain Type Acute pain    Pain Onset 1 to 4 weeks ago  [facility] PT Assessment - 02/27/21 0001       Assessment   Medical Diagnosis Vertigo    Referring Provider (PT) [name] MD    Onset Date/Surgical Date 11/25/20      Precautions   Precautions Fall      Restrictions   Weight Bearing Restrictions No      Balance Screen   Has the patient fallen in the past 6 months No      [place] residence      Prior Function   Level of Independence Independent      Cognition   Overall Cognitive Status Within Functional Limits for tasks assessed      Observation/Other Assessments   Focus on Therapeutic Outcomes (FOTO)  56% function      Ambulation/Gait   Ambulation/Gait Yes    Ambulation/Gait Assistance 7: Independent    Assistive device None    Gait Pattern --   LT deviation, narrowed BOS    Ambulation Surface Level;Indoor    Gait Comments Discussed use of cane for improved balance and safety      Static Standing Balance   Static Standing Balance -  Activities  Tandam Stance - Right Leg;Tandam Stance - Left Leg    Static Standing - Comment/# of Minutes 6-8 seconds bilateral, LOB toward LT                 Vestibular Assessment - 02/27/21 0001       Oculomotor Exam   Saccades Slow   improved accuracy and speed                          Balance Exercises - 02/27/21 0001       Balance Exercises: Standing   Other Standing Exercises tandem stance 3 x 10" (8 sec max), standing head turns/ nods x 10 each, assessed gait in clinic                  PT Short Term Goals - 02/27/21 1005       PT SHORT TERM GOAL #1   Title Patient will be independent with initial HEP and self-management strategies to improve functional outcomes    Baseline Reports compliance    Time 2    Period Weeks    Status Achieved    Target Date 01/20/21               PT Long Term Goals - 02/27/21 1006       PT LONG TERM GOAL #1   Title Patient will improve FOTO score to predicted value to indicate improvement in functional outcomes    Baseline within 4%    Time 4    Period Weeks    Status Partially Met      PT LONG TERM GOAL #2   Title Patient will report at least 80% overall improvement in subjective complaint to indicate improvement in ability to perform ADLs.    Baseline reports 90% improved    Time 4    Period Weeks    Status Achieved      PT LONG TERM GOAL #3   Title Patient will report no vertigo with position changes in and out of bed for improved sleep quality, function, QOL    Baseline no reported vertigo with bed mobility/ sleeping for several weeks    Time 4    Period Weeks    Status Achieved  Plan - 02/27/21 1029     Clinical Impression Statement Patient shows moderated progress overall. Patient reports  decreasing positional vertigo, and some improvement with HA and neck pain. Reporting 90% subjective improvement with vertigo and dizziness symptoms. Patient remains limited by decreased static balance and continued gait deviation. She consistently demos LOB toward left with static balance and gait. This has not shown much improvement since initial evaluation. She is scheduled for MRI and neurology follow up next week. Patient being DC today with all goals met/ partially met. Encouraged patient to follow up with therapy services with any further questions or concern. Also educated patient on use of cane for improved stability and safety given current ongoing gait deviations. Though vertigo episode appears resolved, discussed possible return to therapy in coming weeks for gait instability pending neuro consult.    Examination-Activity Limitations Locomotion Level;Transfers;Sleep;Caring for Others;Other;Lift    Examination-Participation Restrictions Occupation;Community Activity;Laundry;Yard Work;Cleaning;Shop    Stability/Clinical Decision Making Stable/Uncomplicated    Rehab Potential Good    PT Treatment/Interventions ADLs/Self Care Home Management;Moist Heat;Traction;Biofeedback;Canalith Repostioning;Parrafin;Therapeutic activities;Patient/family education;Compression bandaging;Energy conservation;Splinting;Taping;Vasopneumatic Device;Joint Manipulations;Spinal Manipulations;Passive range of motion;Dry needling;Manual techniques;Cognitive remediation;Neuromuscular re-education;Balance training;Vestibular;Visual/perceptual remediation/compensation;Iontophoresis 58m/ml Dexamethasone;DME Instruction;Gait training;Stair training;Ultrasound;Functional mobility training;Fluidtherapy;Electrical Stimulation;Cryotherapy;Contrast Bath;Therapeutic exercise    PT Next Visit Plan DC to HEP    PT Home Exercise Plan Eval: Saccades; 9/15: smooth pursuits ,  cervical isometrics. 9/20 forward leans (seated) 10/17 upper trap  and levator stretch 11/1 standing head turns in corner    Consulted and Agree with Plan of Care Patient             Patient will benefit from skilled therapeutic intervention in order to improve the following deficits and impairments:  Dizziness, Decreased balance, Decreased activity tolerance, Impaired vision/preception  Visit Diagnosis: Dizziness and giddiness     Problem List Patient Active Problem List   Diagnosis Date Noted   Gait disturbance 02/11/2021   Hyperreflexia 02/11/2021   Change in multiple pigmented skin lesions 01/14/2021   Scalp hematoma 12/23/2020   Acute hip pain, left 12/11/2020   Vertigo 12/11/2020   New daily persistent headache (ndph) 06/16/2020   Head trauma 06/10/2020   Hemorrhoids 06/05/2020   S/P hysterectomy 06/05/2020   Other headache syndrome 09/13/2019   Fidgeting 09/13/2019   Dysphagia 06/24/2019   History of 2019 novel coronavirus disease (COVID-19) 06/20/2019   Knee pain 01/30/2019   Recurrent cold sores 03/11/2018   Dyspepsia 03/07/2018   History of multiple sclerosis ([place] 02/11/2018   Neck pain 02/11/2018   History of Bell's palsy 02/11/2018   Abnormal brain MRI 05/19/2017   Carpal tunnel syndrome, bilateral 05/19/2017   Abnormal stress test    Allergic rhinitis 06/20/2015   Dysesthesia 03/04/2015   OSA (obstructive sleep apnea) 10/01/2014   Increased homocysteine 10/01/2014   Urinary incontinence 05/30/2014   Back pain with radiation 02/05/2013   DISORDER OF BONE AND CARTILAGE UNSPECIFIED 12/16/2009   Overweight (BMI 25.0-29.9) 04/08/2008   Depression with anxiety 12/18/2007   Post-concussion headache 12/18/2007   Hyperlipidemia LDL goal <100 12/16/2007   Essential hypertension 12/16/2007   10:45 AM, 02/27/21 CJosue HectorPT DPT  [facility] [phone] [place] [address]Phone:  [phone]  Fax:  [phone] Name: Pamela IGLESIAMRN: [id number] of Birth: 5Feb 21, 1962

## 2021-02-28 ENCOUNTER — Ambulatory Visit (HOSPITAL_COMMUNITY)
Admission: RE | Admit: 2021-02-28 | Discharge: 2021-02-28 | Disposition: A | Discharge status: Home / Self Care | Payer: BC Managed Care – PPO | Source: Ambulatory Visit | Attending: Neurology | Admitting: Neurology

## 2021-02-28 DIAGNOSIS — M542 Cervicalgia: Secondary | ICD-10-CM | POA: Diagnosis not present

## 2021-02-28 DIAGNOSIS — R269 Unspecified abnormalities of gait and mobility: Secondary | ICD-10-CM | POA: Diagnosis not present

## 2021-02-28 DIAGNOSIS — G44309 Post-traumatic headache, unspecified, not intractable: Secondary | ICD-10-CM | POA: Insufficient documentation

## 2021-02-28 DIAGNOSIS — R519 Headache, unspecified: Secondary | ICD-10-CM | POA: Diagnosis not present

## 2021-02-28 DIAGNOSIS — R292 Abnormal reflex: Secondary | ICD-10-CM | POA: Diagnosis not present

## 2021-02-28 DIAGNOSIS — R9089 Other abnormal findings on diagnostic imaging of central nervous system: Secondary | ICD-10-CM | POA: Insufficient documentation

## 2021-03-04 ENCOUNTER — Encounter (HOSPITAL_COMMUNITY): Payer: BC Managed Care – PPO | Admitting: Physical Therapy

## 2021-03-06 ENCOUNTER — Encounter (HOSPITAL_COMMUNITY): Payer: BC Managed Care – PPO | Admitting: Physical Therapy

## 2021-03-11 ENCOUNTER — Encounter (HOSPITAL_COMMUNITY): Payer: BC Managed Care – PPO | Admitting: Physical Therapy

## 2021-03-12 ENCOUNTER — Other Ambulatory Visit: Payer: Self-pay

## 2021-03-12 ENCOUNTER — Ambulatory Visit (INDEPENDENT_AMBULATORY_CARE_PROVIDER_SITE_OTHER): Payer: BC Managed Care – PPO | Admitting: Psychiatry

## 2021-03-12 DIAGNOSIS — F331 Major depressive disorder, recurrent, moderate: Secondary | ICD-10-CM | POA: Diagnosis not present

## 2021-03-12 NOTE — Progress Notes (Signed)
Virtual Visit via Video Note  I connected with [name] on 03/12/21 at 11:15 AM EST by a video enabled telemedicine application and verified that I am speaking with the correct person using two identifiers.  Location: Patient: Home Provider: [place] office    I discussed the limitations of evaluation and management by telemedicine and the availability of in person appointments. The patient expressed understanding and agreed to proceed.   I provided 45 minutes of non-face-to-face time during this encounter.   Alonza Smoker, LCSW                    THERAPIST PROGRESS NOTE             Session Time:  Wednesday 03/12/2021 11:15 AM - 12:00 PM   Participation Level: Active  Behavioral Response: Casual/ alert, anxious, depressed           Type of Therapy: Individual Therapy        Treatment Goals: Elevate mood and show evidence of usual energy, activities, and socialization level , resolve conflicted  feelings and adapt to new life circumstances AEB by patient implementing 3 new activities that increase a level of satisfaction consistently for 3 weeks patient identifying 5 advantages of current life situation                                                 Interventions: Supportive,  CBT  Summary: Pamela Walters is a 60 y.o. female who is referred for services by PCP Dr. Moshe Cipro due to patient suffering symptoms of depression. Patient has been seen in this office interminttently for medication management and outpatient psychotherapy since 2016. She is resuming services  due to increased stress related to family conflict and her job.  Patient's reports increased irritability, decreased interest in activities, excessive worry, poor concentration, tearfulness, fatigue, restlessness, loss of appetite, and poor motivation.       Patient last was seen by virtual visit via video 2 weeks ago.  She reports less depressed mood and continued behavioral activation.   However, she expresses frustration with self when she becomes tired and is unable to perform as many task as she had planned for the day.  She also expresses some anxiety about upcoming holiday as she fears how family members will act.  She is excited about her entire family being together but also has should and ought statements about their behaviors.  Patient reports she does have more energy and is pleased intensity of headaches has decreased. Suicidal/Homicidal: No  Therapist Response:  Reviewed symptoms, praised and reinforced patient's continued behavioral activation/use of daily planning, discussed effects, assisted patient identify ways to pace self including scheduling breaks using daily planning, also assisted patient identify realistic expectations of self, assisted patient replace unhelpful thoughts with helpful thoughts about productivity and doing her best, assisted patient examined her thought patterns and expectations regarding her family, assisted patient identify realistic expectations of family and replace unhelpful thought patterns with more helpful thought patterns   Plan: Return again in 2 weeks.            Diagnosis: Axis I: Major Depressive Disorder, Recurrent, Moderate    Axis II: No diagnosis    Alonza Smoker, LCSW 03/12/2021

## 2021-03-13 ENCOUNTER — Encounter (HOSPITAL_COMMUNITY): Payer: BC Managed Care – PPO | Admitting: Physical Therapy

## 2021-03-13 DIAGNOSIS — R2689 Other abnormalities of gait and mobility: Secondary | ICD-10-CM | POA: Diagnosis not present

## 2021-03-13 DIAGNOSIS — M542 Cervicalgia: Secondary | ICD-10-CM | POA: Diagnosis not present

## 2021-03-13 DIAGNOSIS — R42 Dizziness and giddiness: Secondary | ICD-10-CM | POA: Diagnosis not present

## 2021-03-28 NOTE — Progress Notes (Signed)
Office Visit Note  Patient: Pamela Walters             Date of Birth: Feb 04, 1961           MRN: [id number]             PCP: Fayrene Helper, MD Referring: Fayrene Helper, MD Visit Date: 04/10/2021 Occupation: @GUAROCC @  Subjective:  Pain in multiple joints.   History of Present Illness: Pamela Walters is a 60 y.o. female history of osteoarthritis and her degenerative disc disease.  She states she continues to have pain and discomfort in her bilateral hands especially the left CMC joint.  She states the carpal tunnel syndrome symptoms are manageable currently.  She also continues to have pain and discomfort in her bilateral knee joints.  She has difficulty climbing stairs and getting up from the chair.  She also has nocturnal pain in her thumb and her knees.  She has not noticed any joint swelling.  The lower back pain is manageable.  Activities of Daily Living:  Patient reports morning stiffness for 30-45 minutes.   Patient Reports nocturnal pain.  Difficulty dressing/grooming: Denies Difficulty climbing stairs: Reports Difficulty getting out of chair: Reports Difficulty using hands for taps, buttons, cutlery, and/or writing: Reports  Review of Systems  Constitutional:  Positive for fatigue. Negative for night sweats, weight gain and weight loss.  HENT:  Positive for mouth dryness. Negative for mouth sores, trouble swallowing, trouble swallowing and nose dryness.   Eyes:  Negative for pain, redness, itching, visual disturbance and dryness.  Respiratory:  Negative for cough, shortness of breath and difficulty breathing.   Cardiovascular:  Negative for chest pain, palpitations, hypertension, irregular heartbeat and swelling in legs/feet.  Gastrointestinal:  Negative for blood in stool, constipation and diarrhea.  Endocrine: Negative for increased urination.  Genitourinary:  Negative for difficulty urinating and vaginal dryness.  Musculoskeletal:  Positive for joint pain,  joint pain and morning stiffness. Negative for joint swelling, myalgias, muscle weakness, muscle tenderness and myalgias.  Skin:  Negative for color change, rash, hair loss, redness, skin tightness, ulcers and sensitivity to sunlight.  Allergic/Immunologic: Negative for susceptible to infections.  Neurological:  Positive for headaches, memory loss and weakness. Negative for dizziness, numbness and night sweats.  Hematological:  Positive for bruising/bleeding tendency. Negative for swollen glands.  Psychiatric/Behavioral:  Negative for depressed mood, confusion and sleep disturbance. The patient is not nervous/anxious.    PMFS History:  Patient Active Problem List   Diagnosis Date Noted   Primary osteoarthritis of both hands 04/10/2021   Primary osteoarthritis of both knees 04/10/2021   DDD (degenerative disc disease), lumbar 04/10/2021   Osteopenia of multiple sites 04/10/2021   Vitamin D deficiency 04/10/2021   Gait disturbance 02/11/2021   Hyperreflexia 02/11/2021   Change in multiple pigmented skin lesions 01/14/2021   Scalp hematoma 12/23/2020   Acute hip pain, left 12/11/2020   Vertigo 12/11/2020   New daily persistent headache (ndph) 06/16/2020   Head trauma 06/10/2020   Hemorrhoids 06/05/2020   S/P hysterectomy 06/05/2020   Other headache syndrome 09/13/2019   Fidgeting 09/13/2019   Dysphagia 06/24/2019   History of 2019 novel coronavirus disease (COVID-19) 06/20/2019   Knee pain 01/30/2019   Recurrent cold sores 03/11/2018   Dyspepsia 03/07/2018   History of multiple sclerosis ([place]) 02/11/2018   Neck pain 02/11/2018   History of Bell's palsy 02/11/2018   Abnormal brain MRI 05/19/2017   Carpal tunnel syndrome, bilateral 05/19/2017   Abnormal  stress test    Allergic rhinitis 06/20/2015   Dysesthesia 03/04/2015   OSA (obstructive sleep apnea) 10/01/2014   Increased homocysteine 10/01/2014   Urinary incontinence 05/30/2014   Back pain with radiation 02/05/2013    DISORDER OF BONE AND CARTILAGE UNSPECIFIED 12/16/2009   Overweight (BMI 25.0-29.9) 04/08/2008   Depression with anxiety 12/18/2007   Post-concussion headache 12/18/2007   Hyperlipidemia LDL goal <100 12/16/2007   Essential hypertension 12/16/2007    Past Medical History:  Diagnosis Date   Allergy    Anemia    Anxiety    Arthritis    Back pain    Bell's palsy 08/2017   Depression    Depression    Phreesia 05/19/2020   GERD (gastroesophageal reflux disease)    Hepatic steatosis    Hiatal hernia    History of Bell's palsy 02/11/2018   Hyperlipidemia    Hypertension    Internal hemorrhoids    MS (multiple sclerosis) ([place])    2007   Multiple sclerosis ([place])    Multiple sclerosis ([place]) 04/04/2008   Qualifier: Diagnosis of  By: Moshe Cipro MD, Margaret     Schatzki's ring    Sleep apnea    wears CPAP    Family History  Problem Relation Age of Onset   Hypertension Mother    Hyperlipidemia Mother    Depression Mother    Hypertension Father    Hypertension Sister    High Cholesterol Sister    Healthy Son    Healthy Daughter    Colon cancer Neg Hx    Esophageal cancer Neg Hx    Rectal cancer Neg Hx    Stomach cancer Neg Hx    Colon polyps Neg Hx    Past Surgical History:  Procedure Laterality Date   ABDOMINAL HYSTERECTOMY  2005   fibroids   CARDIAC CATHETERIZATION N/A 05/04/2016   Procedure: Left Heart Cath and Coronary Angiography;  Surgeon: Jettie Booze, MD;  Location: [facility];  Service: Cardiovascular;  Laterality: N/A;   COLONOSCOPY     ESOPHAGOGASTRODUODENOSCOPY N/A 09/18/2013   Procedure: ESOPHAGOGASTRODUODENOSCOPY (EGD);  Surgeon: Jerene Bears, MD;  Location: [facility];  Service: Gastroenterology;  Laterality: N/A;   UPPER GASTROINTESTINAL ENDOSCOPY     WISDOM TOOTH EXTRACTION     Social History   Social History Narrative   Not on file   Immunization History  Administered Date(s) Administered   Influenza Split 01/26/2014    Influenza,inj,Quad PF,6+ Mos 02/06/2020, 01/10/2021   PFIZER(Purple Top)SARS-COV-2 Vaccination 08/12/2019, 09/05/2019, 06/05/2020   Tdap 03/17/2013   Zoster Recombinat (Shingrix) 10/20/2018, 01/30/2019     Objective: Vital Signs: BP 121/78 (BP Location: Left Arm, Patient Position: Sitting, Cuff Size: Normal)   Pulse 85   Ht 5\' 3"  (1.6 m)   Wt 184 lb (83.5 kg)   BMI 32.59 kg/m    Physical Exam Vitals and nursing note reviewed.  Constitutional:      Appearance: She is well-developed.  HENT:     Head: Normocephalic and atraumatic.  Eyes:     Conjunctiva/sclera: Conjunctivae normal.  Cardiovascular:     Rate and Rhythm: Normal rate and regular rhythm.     Heart sounds: Normal heart sounds.  Pulmonary:     Effort: Pulmonary effort is normal.     Breath sounds: Normal breath sounds.  Abdominal:     General: Bowel sounds are normal.     Palpations: Abdomen is soft.  Musculoskeletal:     Cervical back: Normal range of motion.  Lymphadenopathy:     Cervical: No cervical adenopathy.  Skin:    General: Skin is warm and dry.     Capillary Refill: Capillary refill takes less than 2 seconds.  Neurological:     Mental Status: She is alert and oriented to person, place, and time.  Psychiatric:        Behavior: Behavior normal.     Musculoskeletal Exam: C-spine was in good range of motion.  Shoulder joints, elbow joints, wrist joints with good range of motion.  She had bilateral PIP and DIP thickening consistent with osteoarthritis.  No synovitis was noted.  Hip joints were in good range of motion.  She had good range of motion of bilateral knee joints with discomfort.  No warmth swelling or effusion was noted.  She had lot of difficulty walking due to arthritis in her knee joints.  There is no tenderness over ankles or MTPs.  CDAI Exam: CDAI Score: -- Patient Global: --; Provider Global: -- Swollen: --; Tender: -- Joint Exam 04/10/2021   No joint exam has been documented for this  visit   There is currently no information documented on the homunculus. Go to the Rheumatology activity and complete the homunculus joint exam.  Investigation: No additional findings.  Imaging: No results found.  Recent Labs: Lab Results  Component Value Date   WBC 6.9 02/06/2020   HGB 13.1 02/06/2020   PLT 363 02/06/2020   NA 140 12/11/2020   K 3.8 12/11/2020   CL 99 12/11/2020   CO2 25 12/11/2020   GLUCOSE 79 12/11/2020   BUN 19 12/11/2020   CREATININE 0.83 12/11/2020   BILITOT 0.3 12/11/2020   ALKPHOS 109 12/11/2020   AST 19 12/11/2020   ALT 15 12/11/2020   PROT 7.3 12/11/2020   ALBUMIN 4.7 12/11/2020   CALCIUM 9.7 12/11/2020   GFRAA 65 06/05/2020    Speciality Comments: No specialty comments available.  Procedures:  Large Joint Inj: bilateral knee on 04/10/2021 2:08 PM Indications: pain Details: 27 G 1.5 in needle, medial approach  Arthrogram: No  Medications (Right): 1.5 mL lidocaine 1 %; 40 mg triamcinolone acetonide 40 MG/ML Medications (Left): 1.5 mL lidocaine 1 %; 40 mg triamcinolone acetonide 40 MG/ML Outcome: tolerated well, no immediate complications Procedure, treatment alternatives, risks and benefits explained, specific risks discussed. Consent was given by the patient. Immediately prior to procedure a time out was called to verify the correct patient, procedure, equipment, support staff and site/side marked as required. Patient was prepped and draped in the usual sterile fashion.    Allergies: Patient has no known allergies.   Assessment / Plan:     Visit Diagnoses: Primary osteoarthritis of both hands -she continues to have pain and discomfort in her bilateral hands.  She states her left CMC joint has been painful.  Joint protection muscle strengthening was discussed.  Use of CMC brace was discussed.  Clinical and radiographic findings were consistent with bilateral severe osteoarthritis.   Carpal tunnel syndrome, bilateral-improved.  Lateral  epicondylitis, left elbow-resolved.  Primary osteoarthritis of both knees - bilateral severe osteoarthritis and severe chondromalacia patella.  She had good response to cortisone injections which she had in August.  She states that the pain and discomfort is gradually coming back.  She is still not sure when she will be able to have total knee replacement.  She complains of nocturnal pain.  She has difficulty walking and feels instability.  Detailed counsel regarding total knee replacement was provided.  She will think  about it and will let me know.  She requested bilateral knee joint cortisone injections today.  After informed consent was obtained bilateral knee joints were injected with lidocaine and cortisone as described above.  She tolerated the procedure well.  Postprocedure instructions were given.  Lower extremity muscle strengthening exercises handout was given.  I encouraged her to use a stationary bike to strengthen lower extremity muscles.  She will let me know when she is ready for total knee replacement and we can place a referral.  DDD (degenerative disc disease), lumbar - Multilevel spondylosis and L4-L5 spondylolisthesis with facet joint arthropathy.  She is followed by Dr. Rolena Infante and gets injections by Dr. Nelva Bush  Osteopenia of multiple sites - DEXA 08/27/2020 DualFemur Total Right 08/27/2020 61.9 Osteopenia -1.3 0.841 g/cm2.  Use of calcium rich diet and vitamin D was discussed.  Other medical problems are listed as follows:  Vitamin D deficiency  History of Bell's palsy  Pharyngoesophageal dysphagia  Essential hypertension-her blood pressure is normal today.  Depression with anxiety  Hyperlipidemia LDL goal <100-dietary modifications and exercise was discussed.  History of 2019 novel coronavirus disease (COVID-19) - January 2021  Increased homocysteine  OSA (obstructive sleep apnea)-she uses CPAP.  Orders: Orders Placed This Encounter  Procedures   Large Joint Inj     No orders of the defined types were placed in this encounter.  Total time spent with the patient was 30 minutes.  More than 50% time was spent in counseling and coordination of care.  Follow-Up Instructions: Return in about 4 months (around 08/09/2021) for Osteoarthritis.   Bo Merino, MD  Note - This record has been created using Editor, commissioning.  Chart creation errors have been sought, but may not always  have been located. Such creation errors do not reflect on  the standard of medical care.

## 2021-04-10 ENCOUNTER — Encounter: Payer: Self-pay | Admitting: Rheumatology

## 2021-04-10 ENCOUNTER — Ambulatory Visit: Payer: BC Managed Care – PPO | Admitting: Rheumatology

## 2021-04-10 ENCOUNTER — Other Ambulatory Visit: Payer: Self-pay

## 2021-04-10 VITALS — BP 121/78 | HR 85 | Ht 63.0 in | Wt 184.0 lb

## 2021-04-10 DIAGNOSIS — E559 Vitamin D deficiency, unspecified: Secondary | ICD-10-CM

## 2021-04-10 DIAGNOSIS — F418 Other specified anxiety disorders: Secondary | ICD-10-CM

## 2021-04-10 DIAGNOSIS — M170 Bilateral primary osteoarthritis of knee: Secondary | ICD-10-CM | POA: Diagnosis not present

## 2021-04-10 DIAGNOSIS — M8589 Other specified disorders of bone density and structure, multiple sites: Secondary | ICD-10-CM

## 2021-04-10 DIAGNOSIS — M7712 Lateral epicondylitis, left elbow: Secondary | ICD-10-CM

## 2021-04-10 DIAGNOSIS — Z8616 Personal history of COVID-19: Secondary | ICD-10-CM

## 2021-04-10 DIAGNOSIS — G5603 Carpal tunnel syndrome, bilateral upper limbs: Secondary | ICD-10-CM

## 2021-04-10 DIAGNOSIS — M5136 Other intervertebral disc degeneration, lumbar region: Secondary | ICD-10-CM | POA: Diagnosis not present

## 2021-04-10 DIAGNOSIS — M19042 Primary osteoarthritis, left hand: Secondary | ICD-10-CM

## 2021-04-10 DIAGNOSIS — G4733 Obstructive sleep apnea (adult) (pediatric): Secondary | ICD-10-CM

## 2021-04-10 DIAGNOSIS — R1314 Dysphagia, pharyngoesophageal phase: Secondary | ICD-10-CM

## 2021-04-10 DIAGNOSIS — Z8669 Personal history of other diseases of the nervous system and sense organs: Secondary | ICD-10-CM

## 2021-04-10 DIAGNOSIS — I10 Essential (primary) hypertension: Secondary | ICD-10-CM

## 2021-04-10 DIAGNOSIS — M19041 Primary osteoarthritis, right hand: Secondary | ICD-10-CM

## 2021-04-10 DIAGNOSIS — R7989 Other specified abnormal findings of blood chemistry: Secondary | ICD-10-CM

## 2021-04-10 DIAGNOSIS — E785 Hyperlipidemia, unspecified: Secondary | ICD-10-CM

## 2021-04-10 MED ORDER — TRIAMCINOLONE ACETONIDE 40 MG/ML IJ SUSP
40.0000 mg | INTRAMUSCULAR | Status: AC | PRN
  Administered 2021-04-10: 40 mg via INTRA_ARTICULAR

## 2021-04-10 MED ORDER — LIDOCAINE HCL 1 % IJ SOLN
1.5000 mL | INTRAMUSCULAR | Status: AC | PRN
  Administered 2021-04-10: 1.5 mL

## 2021-04-10 NOTE — Patient Instructions (Signed)
Knee Exercises Ask your health care provider which exercises are safe for you. Do exercises exactly as told by your health care provider and adjust them as directed. It is normal to feel mild stretching, pulling, tightness, or discomfort as you do these exercises. Stop right away if you feel sudden pain or your pain gets worse. Do not begin these exercises until told by your health care provider. Stretching and range-of-motion exercises These exercises warm up your muscles and joints and improve the movement and flexibility of your knee. These exercises also help to relieve pain and swelling. Knee extension, prone  Lie on your abdomen (prone position) on a bed. Place your left / right knee just beyond the edge of the surface so your knee is not on the bed. You can put a towel under your left / right thigh just above your kneecap for comfort. Relax your leg muscles and allow gravity to straighten your knee (extension). You should feel a stretch behind your left / right knee. Hold this position for __________ seconds. Scoot up so your knee is supported between repetitions. Repeat __________ times. Complete this exercise __________ times a day. Knee flexion, active  Lie on your back with both legs straight. If this causes back discomfort, bend your left / right knee so your foot is flat on the floor. Slowly slide your left / right heel back toward your buttocks. Stop when you feel a gentle stretch in the front of your knee or thigh (flexion). Hold this position for __________ seconds. Slowly slide your left / right heel back to the starting position. Repeat __________ times. Complete this exercise __________ times a day. Quadriceps stretch, prone  Lie on your abdomen on a firm surface, such as a bed or padded floor. Bend your left / right knee and hold your ankle. If you cannot reach your ankle or pant leg, loop a belt around your foot and grab the belt instead. Gently pull your heel toward your  buttocks. Your knee should not slide out to the side. You should feel a stretch in the front of your thigh and knee (quadriceps). Hold this position for __________ seconds. Repeat __________ times. Complete this exercise __________ times a day. Hamstring, supine  Lie on your back (supine position). Loop a belt or towel over the ball of your left / right foot. The ball of your foot is on the walking surface, right under your toes. Straighten your left / right knee and slowly pull on the belt to raise your leg until you feel a gentle stretch behind your knee (hamstring). Do not let your knee bend while you do this. Keep your other leg flat on the floor. Hold this position for __________ seconds. Repeat __________ times. Complete this exercise __________ times a day. Strengthening exercises These exercises build strength and endurance in your knee. Endurance is the ability to use your muscles for a long time, even after they get tired. Quadriceps, isometric This exercise strengthens the muscles in front of your thigh (quadriceps) without moving your knee joint (isometric). Lie on your back with your left / right leg extended and your other knee bent. Put a rolled towel or small pillow under your knee if told by your health care provider. Slowly tense the muscles in the front of your left / right thigh. You should see your kneecap slide up toward your hip or see increased dimpling just above the knee. This motion will push the back of the knee toward the floor.   For __________ seconds, hold the muscle as tight as you can without increasing your pain. Relax the muscles slowly and completely. Repeat __________ times. Complete this exercise __________ times a day. Straight leg raises This exercise strengthens the muscles in front of your thigh (quadriceps) and the muscles that move your hips (hip flexors). Lie on your back with your left / right leg extended and your other knee bent. Tense the  muscles in the front of your left / right thigh. You should see your kneecap slide up or see increased dimpling just above the knee. Your thigh may even shake a bit. Keep these muscles tight as you raise your leg 4-6 inches (10-15 cm) off the floor. Do not let your knee bend. Hold this position for __________ seconds. Keep these muscles tense as you lower your leg. Relax your muscles slowly and completely after each repetition. Repeat __________ times. Complete this exercise __________ times a day. Hamstring, isometric  Lie on your back on a firm surface. Bend your left / right knee about __________ degrees. Dig your left / right heel into the surface as if you are trying to pull it toward your buttocks. Tighten the muscles in the back of your thighs (hamstring) to "dig" as hard as you can without increasing any pain. Hold this position for __________ seconds. Release the tension gradually and allow your muscles to relax completely for __________ seconds after each repetition. Repeat __________ times. Complete this exercise __________ times a day. Hamstring curls If told by your health care provider, do this exercise while wearing ankle weights. Begin with __________lb / kg weights. Then increase the weight by 1 lb (0.5 kg) increments. Do not wear ankle weights that are more than __________lb / kg. Lie on your abdomen with your legs straight. Bend your left / right knee as far as you can without feeling pain. Keep your hips flat against the floor. Hold this position for __________ seconds. Slowly lower your leg to the starting position. Repeat __________ times. Complete this exercise __________ times a day. Squats This exercise strengthens the muscles in front of your thigh and knee (quadriceps). Stand in front of a table, with your feet and knees pointing straight ahead. You may rest your hands on the table for balance but not for support. Slowly bend your knees and lower your hips like you  are going to sit in a chair. Keep your weight over your heels, not over your toes. Keep your lower legs upright so they are parallel with the table legs. Do not let your hips go lower than your knees. Do not bend lower than told by your health care provider. If your knee pain increases, do not bend as low. Hold the squat position for __________ seconds. Slowly push with your legs to return to standing. Do not use your hands to pull yourself to standing. Repeat __________ times. Complete this exercise __________ times a day. Wall slides This exercise strengthens the muscles in front of your thigh and knee (quadriceps). Lean your back against a smooth wall or door, and walk your feet out 18-24 inches (46-61 cm) from it. Place your feet hip-width apart. Slowly slide down the wall or door until your knees bend __________ degrees. Keep your knees over your heels, not over your toes. Keep your knees in line with your hips. Hold this position for __________ seconds. Repeat __________ times. Complete this exercise __________ times a day. Straight leg raises, side-lying This exercise strengthens the muscles that rotate   the leg at the hip and move it away from your body (hip abductors). Lie on your side with your left / right leg in the top position. Lie so your head, shoulder, knee, and hip line up. You may bend your bottom knee to help you keep your balance. Roll your hips slightly forward so your hips are stacked directly over each other and your left / right knee is facing forward. Leading with your heel, lift your top leg 4-6 inches (10-15 cm). You should feel the muscles in your outer hip lifting. Do not let your foot drift forward. Do not let your knee roll toward the ceiling. Hold this position for __________ seconds. Slowly return your leg to the starting position. Let your muscles relax completely after each repetition. Repeat __________ times. Complete this exercise __________ times a  day. Straight leg raises, prone This exercise stretches the muscles that move your hips away from the front of the pelvis (hip extensors). Lie on your abdomen on a firm surface. You can put a pillow under your hips if that is more comfortable. Tense the muscles in your buttocks and lift your left / right leg about 4-6 inches (10-15 cm). Keep your knee straight as you lift your leg. Hold this position for __________ seconds. Slowly lower your leg to the starting position. Let your leg relax completely after each repetition. Repeat __________ times. Complete this exercise __________ times a day. This information is not intended to replace advice given to you by your health care provider. Make sure you discuss any questions you have with your health care provider. Document Revised: 12/24/2020 Document Reviewed: 12/24/2020 Elsevier Patient Education  2022 [employer]  

## 2021-04-11 ENCOUNTER — Ambulatory Visit: Payer: BC Managed Care – PPO | Admitting: Family Medicine

## 2021-04-14 ENCOUNTER — Other Ambulatory Visit: Payer: Self-pay | Admitting: Family Medicine

## 2021-05-01 ENCOUNTER — Ambulatory Visit: Payer: BC Managed Care – PPO | Admitting: Family Medicine

## 2021-05-08 ENCOUNTER — Ambulatory Visit: Payer: BC Managed Care – PPO | Admitting: Neurology

## 2021-05-13 ENCOUNTER — Telehealth: Payer: Self-pay

## 2021-05-13 ENCOUNTER — Other Ambulatory Visit (HOSPITAL_COMMUNITY): Payer: Self-pay | Admitting: Psychiatry

## 2021-05-13 ENCOUNTER — Other Ambulatory Visit: Payer: Self-pay | Admitting: Family Medicine

## 2021-05-13 MED ORDER — CLONAZEPAM 0.5 MG PO TABS: 0.5000 mg | ORAL_TABLET | Freq: Two times a day (BID) | ORAL | 2 refills | Status: DC | PRN

## 2021-05-13 MED ORDER — TRAMADOL HCL 50 MG PO TABS: ORAL_TABLET | ORAL | 0 refills | Status: DC

## 2021-05-13 NOTE — Telephone Encounter (Signed)
Called pt left message .

## 2021-05-13 NOTE — Telephone Encounter (Signed)
Patient requests tramadol refill to [facility]

## 2021-05-13 NOTE — Telephone Encounter (Signed)
The pharmacy called back and said they could not dispense a quantity of 270 at a max of 4 per day because it is a controlled substance. They said the directions would have to say 8 tabs max daily to make it a 30 day supply. Please advise

## 2021-05-14 ENCOUNTER — Other Ambulatory Visit: Payer: Self-pay | Admitting: Family Medicine

## 2021-05-14 MED ORDER — TRAMADOL HCL 50 MG PO TABS: ORAL_TABLET | ORAL | 0 refills | Status: DC

## 2021-05-14 NOTE — Telephone Encounter (Signed)
She wants it sent to [facility] and she said she wanted it sent as 90 for a 30 day supply is fine

## 2021-05-14 NOTE — Progress Notes (Signed)
Med sent.

## 2021-05-16 ENCOUNTER — Other Ambulatory Visit: Payer: Self-pay

## 2021-06-04 ENCOUNTER — Ambulatory Visit: Payer: BC Managed Care – PPO | Admitting: Family Medicine

## 2021-06-04 ENCOUNTER — Other Ambulatory Visit: Payer: Self-pay

## 2021-06-04 VITALS — Temp 99.0°F | Wt 171.0 lb

## 2021-06-04 DIAGNOSIS — G44309 Post-traumatic headache, unspecified, not intractable: Secondary | ICD-10-CM | POA: Diagnosis not present

## 2021-06-04 DIAGNOSIS — R269 Unspecified abnormalities of gait and mobility: Secondary | ICD-10-CM

## 2021-06-04 DIAGNOSIS — I10 Essential (primary) hypertension: Secondary | ICD-10-CM | POA: Diagnosis not present

## 2021-06-04 DIAGNOSIS — N39498 Other specified urinary incontinence: Secondary | ICD-10-CM

## 2021-06-04 DIAGNOSIS — R42 Dizziness and giddiness: Secondary | ICD-10-CM | POA: Diagnosis not present

## 2021-06-04 DIAGNOSIS — F418 Other specified anxiety disorders: Secondary | ICD-10-CM

## 2021-06-04 DIAGNOSIS — S0990XS Unspecified injury of head, sequela: Secondary | ICD-10-CM

## 2021-06-04 MED ORDER — OXYBUTYNIN CHLORIDE ER 10 MG PO TB24: 10.0000 mg | ORAL_TABLET | Freq: Every day | ORAL | 1 refills | Status: DC

## 2021-06-04 NOTE — Patient Instructions (Addendum)
F/U in office in 5 monhts, call if  you need me before  I recommend you have Neurology re evaluate you specifically with your ability to work   Fasting lipid, cmp and eGFR in next 1 to 3 weeks  New medication requested is sent in for incontinence  Thanks for choosing [facility], we consider it a privelige to serve you.

## 2021-06-04 NOTE — Progress Notes (Addendum)
Virtual Visit via Telephone Note  I connected with Pamela Walters on 06/04/21 at  1:20 PM EST by telephone and verified that I am speaking with the correct person using two identifiers.  Location: Patient: home Provider: office   I discussed the limitations, risks, security and privacy concerns of performing an evaluation and management service by telephone and the availability of in person appointments. I also discussed with the patient that there may be a patient responsible charge related to this service. The patient expressed understanding and agreed to proceed.   History of Present Illness: F/u concussion in August , dizziness is 80 % improved reportedly Gait is also improved, 70 to 80% Headaches are daily , but some improvement  Feels unable to work, Awaiting ENT eval went to PT , but still has daily , disabling vertigo Wants to work but feels incapable, often depressed over the acute deterioration in her level of function following her accidental fall while on vacation int he Summer. Needs Neurology follow up and ongoing evaluation to determine the extent of the long term consequence of her head injury and  ability to work C/o urinary incontinence and requests medication for this   Observations/Objective: Temp 99 F (37.2 C)    Wt 171 lb (77.6 kg)    BMI 30.29 kg/m  Good communication with no confusion and intact memory. Alert and oriented x 3 No signs of respiratory distress during speech   Assessment and Plan:  Vertigo Ongoing  disabling vertigo reported despite PT, has upcoming appt with ENT, ongoing symptom apparently  aggravated by head trauma   Head trauma Still reporting daily headache, unsteady gait and vertigo from accident 6 months prior, needs Neurology follow up and evaluation re ability to return to work   Essential hypertension DASH diet and commitment to daily physical activity for a minimum of 30 minutes discussed and encouraged, as a part of  hypertension management. The importance of attaining a healthy weight is also discussed.  BP/Weight 06/04/2021 04/10/2021 02/11/2021 01/10/2021 12/11/2020 12/09/2020 6/44/0347  Systolic BP - 425 956 387 564 332 951  Diastolic BP - 78 82 76 75 82 79  Wt. (Lbs) 171 184 178 171 170.12 172 165  BMI 30.29 32.59 31.53 30.29 30.14 30.47 29.23  Some encounter information is confidential and restricted. Go to Review Flowsheets activity to see all data.       Gait disturbance Still reports unsteady gait following head trauma approx 6 months ago, home safety and fall risk reduction briefly reviewed  Post-concussion headache Still reports daily headaches which are disabling following direct head trama in 11/2020. Close Neurology follow up and management encouraged , also advised her tha Neurology will need to determine her ability to return to work  Depression with anxiety Currently challenged because of prolonged debility following accident in 11/2020, not suicidal or homicidal, has  Psychiatric support whiz is helpful  Urinary incontinence curently not being treated and symptomatic, med she was on is expensive, ditropan prescribed   Follow Up Instructions:    I discussed the assessment and treatment plan with the patient. The patient was provided an opportunity to ask questions and all were answered. The patient agreed with the plan and demonstrated an understanding of the instructions.   The patient was advised to call back or seek an in-person evaluation if the symptoms worsen or if the condition fails to improve as anticipated.  I provided 22 minutes of non-face-to-face time during this encounter.   [name], MD

## 2021-06-05 ENCOUNTER — Other Ambulatory Visit: Payer: Self-pay

## 2021-06-05 DIAGNOSIS — I10 Essential (primary) hypertension: Secondary | ICD-10-CM

## 2021-06-05 DIAGNOSIS — E785 Hyperlipidemia, unspecified: Secondary | ICD-10-CM

## 2021-06-09 DIAGNOSIS — H8102 Meniere's disease, left ear: Secondary | ICD-10-CM | POA: Diagnosis not present

## 2021-06-09 DIAGNOSIS — R42 Dizziness and giddiness: Secondary | ICD-10-CM | POA: Diagnosis not present

## 2021-06-09 DIAGNOSIS — H9042 Sensorineural hearing loss, unilateral, left ear, with unrestricted hearing on the contralateral side: Secondary | ICD-10-CM | POA: Diagnosis not present

## 2021-06-09 DIAGNOSIS — H9312 Tinnitus, left ear: Secondary | ICD-10-CM | POA: Diagnosis not present

## 2021-06-09 DIAGNOSIS — H6121 Impacted cerumen, right ear: Secondary | ICD-10-CM | POA: Diagnosis not present

## 2021-06-14 ENCOUNTER — Encounter: Payer: Self-pay | Admitting: Family Medicine

## 2021-06-14 NOTE — Assessment & Plan Note (Signed)
DASH diet and commitment to daily physical activity for a minimum of 30 minutes discussed and encouraged, as a part of hypertension management. The importance of attaining a healthy weight is also discussed.  BP/Weight 06/04/2021 04/10/2021 02/11/2021 01/10/2021 12/11/2020 12/09/2020 4/36/0677  Systolic BP - 034 035 248 185 909 311  Diastolic BP - 78 82 76 75 82 79  Wt. (Lbs) 171 184 178 171 170.12 172 165  BMI 30.29 32.59 31.53 30.29 30.14 30.47 29.23  Some encounter information is confidential and restricted. Go to Review Flowsheets activity to see all data.

## 2021-06-14 NOTE — Assessment & Plan Note (Signed)
Still reports daily headaches which are disabling following direct head trama in 11/2020. Close Neurology follow up and management encouraged , also advised her tha Neurology will need to determine her ability to return to work

## 2021-06-14 NOTE — Assessment & Plan Note (Addendum)
Currently challenged because of prolonged debility following accident in 11/2020, not suicidal or homicidal, has  Psychiatric support Frutoso Schatz is helpful

## 2021-06-14 NOTE — Assessment & Plan Note (Signed)
Ongoing  disabling vertigo reported despite PT, has upcoming appt with ENT, ongoing symptom apparently  aggravated by head trauma

## 2021-06-14 NOTE — Assessment & Plan Note (Signed)
Still reports unsteady gait following head trauma approx 6 months ago, home safety and fall risk reduction briefly reviewed

## 2021-06-14 NOTE — Assessment & Plan Note (Signed)
Still reporting daily headache, unsteady gait and vertigo from accident 6 months prior, needs Neurology follow up and evaluation re ability to return to work

## 2021-06-15 NOTE — Assessment & Plan Note (Signed)
curently not being treated and symptomatic, med she was on is expensive, ditropan prescribed

## 2021-06-17 ENCOUNTER — Telehealth (HOSPITAL_COMMUNITY): Payer: Self-pay | Admitting: Psychiatry

## 2021-06-17 NOTE — Telephone Encounter (Signed)
Called to schedule f/u appt unable to leave vm due to mailbox being full

## 2021-07-07 ENCOUNTER — Ambulatory Visit (INDEPENDENT_AMBULATORY_CARE_PROVIDER_SITE_OTHER): Payer: BC Managed Care – PPO | Admitting: Psychiatry

## 2021-07-07 ENCOUNTER — Other Ambulatory Visit: Payer: Self-pay

## 2021-07-07 DIAGNOSIS — F331 Major depressive disorder, recurrent, moderate: Secondary | ICD-10-CM

## 2021-07-07 NOTE — Progress Notes (Signed)
Virtual Visit via Video Note ? ?I connected with Pamela Walters on 07/07/21 at 12:11 PM DST by a video enabled telemedicine application and verified that I am speaking with the correct person using two identifiers. ? ?Location: ?Patient: Home ?Provider: [facility] [place] office  ?  ?I discussed the limitations of evaluation and management by telemedicine and the availability of in person appointments. The patient expressed understanding and agreed to proceed. ? ? ?I provided 42 minutes of non-face-to-face time during this encounter. ? ? ?Ayvah Caroll E Sherea Liptak, LCSW ? ? ?  ?               ?THERAPIST PROGRESS NOTE ?         ? ? ? ?Session Time:  Monday 07/07/2021 1:10 PM - 1:52 PM   ? ?Participation Level: Active ? ?Behavioral Response: Casual/ alert, anxious, depressed          ? ?Type of Therapy: Individual Therapy       ? ?Treatment Goals: Elevate mood and show evidence of usual energy, activities, and socialization level , resolve conflicted  feelings and adapt to new life circumstances AEB by patient implementing 3 new activities that increase a level of satisfaction consistently for 3 weeks patient identifying 5 advantages of current life situation                             ?                  ? ? ?Interventions: Supportive,  CBT ? ?Summary: Pamela Walters is a 61 y.o. female who is referred for services by PCP Dr. Moshe Cipro due to patient suffering symptoms of depression. Patient has been seen in this office interminttently for medication management and outpatient psychotherapy since 2016. She is resuming services  due to increased stress related to family conflict and her job.  Patient's reports increased irritability, decreased interest in activities, excessive worry, poor concentration, tearfulness, fatigue, restlessness, loss of appetite, and poor motivation.    ?   ?Patient last was seen by virtual visit via video 4 months ago.    She reports increased symptoms of depression as reflected in PHQ-9  including poor motivation and diminished interest/pleasure in activities.  She also reports having significant sleep difficulty and poor appetite. Patient reports being pleased with the relationship with her family.  However, she reports stress related to her continued health issues as her recovery from concussion has not been like she thought it would be.  She reports retiring in December 2022 as she realized she just was not able to return to work.  She continues to experience brain fog and short-term memory issues.  She continues to do have some balance issues and walks with a cane.  She is in the process of applying for disability.  Patient reports sadness and difficulty adjusting to transitioning from working to being retired. lSuicidal/Homicidal: No ? ?Therapist Response:  Reviewed symptoms, discussed stressors, facilitated expression of thoughts and feelings, validated feelings began to discuss next steps for treatment, assisted patient identify ways her life has changed, normalized feelings of sadness and loss regarding leaving her job  ?Plan: Return again in 2 weeks.  ?          ?Diagnosis: Axis I: Major Depressive Disorder, Recurrent, Moderate ? ?  Axis II: No diagnosis ? ?Collaboration of Care: Psychiatrist AEB by patient working with psychiatrist Dr. Harrington Challenger, encouraging patient to follow  through with medication management appointment ? ?Patient/Guardian was advised Release of Information must be obtained prior to any record release in order to collaborate their care with an outside provider. Patient/Guardian was advised if they have not already done so to contact the registration department to sign all necessary forms in order for [place] to release information regarding their care.  ? ?Consent: Patient/Guardian gives verbal consent for treatment and assignment of benefits for services provided during this visit. Patient/Guardian expressed understanding and agreed to proceed.  ? ?[place],  LCSW ?07/07/2021 ? ? ?

## 2021-07-11 ENCOUNTER — Other Ambulatory Visit: Payer: Self-pay

## 2021-07-11 ENCOUNTER — Encounter (HOSPITAL_COMMUNITY): Payer: Self-pay | Admitting: Psychiatry

## 2021-07-11 ENCOUNTER — Telehealth (INDEPENDENT_AMBULATORY_CARE_PROVIDER_SITE_OTHER): Payer: BC Managed Care – PPO | Admitting: Psychiatry

## 2021-07-11 DIAGNOSIS — F331 Major depressive disorder, recurrent, moderate: Secondary | ICD-10-CM

## 2021-07-11 MED ORDER — BUPROPION HCL ER (XL) 300 MG PO TB24: 300.0000 mg | ORAL_TABLET | ORAL | 2 refills | Status: DC

## 2021-07-11 MED ORDER — PRAZOSIN HCL 2 MG PO CAPS: 2.0000 mg | ORAL_CAPSULE | Freq: Every day | ORAL | 2 refills | Status: DC

## 2021-07-11 MED ORDER — METHYLPHENIDATE HCL 10 MG PO TABS: 10.0000 mg | ORAL_TABLET | Freq: Two times a day (BID) | ORAL | 0 refills | Status: DC

## 2021-07-11 MED ORDER — CLONAZEPAM 0.5 MG PO TABS: 0.5000 mg | ORAL_TABLET | Freq: Two times a day (BID) | ORAL | 2 refills | Status: DC | PRN

## 2021-07-11 MED ORDER — DULOXETINE HCL 60 MG PO CPEP
120.0000 mg | ORAL_CAPSULE | Freq: Every day | ORAL | 3 refills | Status: DC
Start: 2021-07-11 — End: 2022-02-19

## 2021-07-11 NOTE — Progress Notes (Signed)
Virtual Visit via Video Note ? ?I connected with Pamela Walters on 07/11/21 at 10:40 AM EDT by a video enabled telemedicine application and verified that I am speaking with the correct person using two identifiers. ? ?Location: ?Patient: home ?Provider: office ?  ?I discussed the limitations of evaluation and management by telemedicine and the availability of in person appointments. The patient expressed understanding and agreed to proceed. ? ? ? ?  ?I discussed the assessment and treatment plan with the patient. The patient was provided an opportunity to ask questions and all were answered. The patient agreed with the plan and demonstrated an understanding of the instructions. ?  ?The patient was advised to call back or seek an in-person evaluation if the symptoms worsen or if the condition fails to improve as anticipated. ? ?I provided 20 minutes of non-face-to-face time during this encounter. ? ? ?[name], MD ? ?BH MD/PA/NP OP Progress Note ? ?07/11/2021 11:07 AM ?Pamela Walters  ?MRN:  [id number] ? ?Chief Complaint:  ?Chief Complaint  ?Patient presents with  ? Depression  ? Anxiety  ? Follow-up  ? ?HPI: This patient is a 61 year old married white female who lives with her husband in [place].  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 ? ?The patient returns for follow-up after about 5 months.  Last time she told me she had fallen on August 1 and hit her head on concrete.  She was knocked out for about 20 minutes.  She is still having sequelae from this such as dizziness loss of balance his headache and memory loss.  She is working with an ENT and doing vestibular studies.  She is discouraged about the lack of progress that she still having problems with balance and has to walk with a cane. ? ?Because of although she retired in December from her [employer] in [place].  She feels somewhat useless now.  She states that she does not have any motivation or energy to do much of anything and  feels more depressed.  She is having more trouble sleeping and her nightmares are getting worse.  She denies any thoughts of self-harm or suicide but she has a lot of fatigue.  She also has chronic knee pain.  She does not want to change all of her antidepressants because they were working well for so long and she thinks she just has to get herself going.  I suggested a low-dose of methylphenidate to help with focus and motivation as well as an increase in the praises and to help with nightmares. ? ? ?Visit Diagnosis:  ?  ICD-10-CM   ?1. Major depressive disorder, recurrent episode, moderate (HCC)  F33.1   ?  ? ? ?Past Psychiatric History: none ? ?Past Medical History:  ?Past Medical History:  ?Diagnosis Date  ? Allergy   ? Anemia   ? Anxiety   ? Arthritis   ? Back pain   ? Bell's palsy 08/2017  ? Depression   ? Depression   ? Phreesia 05/19/2020  ? GERD (gastroesophageal reflux disease)   ? Hepatic steatosis   ? Hiatal hernia   ? History of Bell's palsy 02/11/2018  ? Hyperlipidemia   ? Hypertension   ? Internal hemorrhoids   ? MS (multiple sclerosis) ([place])   ? 2007  ? Multiple sclerosis ([place])   ? Multiple sclerosis ([place]) 04/04/2008  ? Qualifier: Diagnosis of  By: Moshe Cipro MD, Joycelyn Schmid    ? Schatzki's ring   ?  Sleep apnea   ? wears CPAP  ?  ?Past Surgical History:  ?Procedure Laterality Date  ? ABDOMINAL HYSTERECTOMY  2005  ? fibroids  ? CARDIAC CATHETERIZATION N/A 05/04/2016  ? Procedure: Left Heart Cath and Coronary Angiography;  Surgeon: Jettie Booze, MD;  Location: [place] [facility];  Service: Cardiovascular;  Laterality: N/A;  ? COLONOSCOPY    ? ESOPHAGOGASTRODUODENOSCOPY N/A 09/18/2013  ? Procedure: ESOPHAGOGASTRODUODENOSCOPY (EGD);  Surgeon: Jerene Bears, MD;  Location: [place];  Service: Gastroenterology;  Laterality: N/A;  ? UPPER GASTROINTESTINAL ENDOSCOPY    ? WISDOM TOOTH EXTRACTION    ? ? ?Family Psychiatric History: see below ? ?Family History:  ?Family History  ?Problem Relation Age of Onset   ? Hypertension Mother   ? Hyperlipidemia Mother   ? Depression Mother   ? Hypertension Father   ? Hypertension Sister   ? High Cholesterol Sister   ? Healthy Son   ? Healthy Daughter   ? Colon cancer Neg Hx   ? Esophageal cancer Neg Hx   ? Rectal cancer Neg Hx   ? Stomach cancer Neg Hx   ? Colon polyps Neg Hx   ? ? ?Social History:  ?Social History  ? ?Socioeconomic History  ? Marital status: Married  ?  Spouse name: Not on file  ? Number of children: Not on file  ? Years of education: Not on file  ? Highest education level: Not on file  ?Occupational History  ? Not on file  ?Tobacco Use  ? Smoking status: Never  ? Smokeless tobacco: Never  ?Vaping Use  ? Vaping Use: Never used  ?Substance and Sexual Activity  ? Alcohol use: No  ? Drug use: No  ? Sexual activity: Yes  ?  Birth control/protection: Surgical  ?  Comment: hyst  ?Other Topics Concern  ? Not on file  ?Social History Narrative  ? Not on file  ? ?Social Determinants of Health  ? ?Financial Resource Strain: Not on file  ?Food Insecurity: Not on file  ?Transportation Needs: Not on file  ?Physical Activity: Not on file  ?Stress: Not on file  ?Social Connections: Not on file  ? ? ?Allergies: No Known Allergies ? ?Metabolic Disorder Labs: ?Lab Results  ?Component Value Date  ? HGBA1C 5.5 05/10/2017  ? MPG 111 05/10/2017  ? MPG 108 02/20/2016  ? ?No results found for: PROLACTIN ?Lab Results  ?Component Value Date  ? CHOL 142 12/11/2020  ? TRIG 98 12/11/2020  ? HDL 64 12/11/2020  ? CHOLHDL 2.2 12/11/2020  ? VLDL 34 (H) 06/30/2016  ? [place] 60 12/11/2020  ? LDLCALC 179 (H) 06/05/2020  ? ?Lab Results  ?Component Value Date  ? TSH 2.480 02/06/2020  ? TSH 2.36 01/30/2019  ? ? ?Therapeutic Level Labs: ?No results found for: LITHIUM ?No results found for: VALPROATE ?No components found for:  CBMZ ? ?Current Medications: ?Current Outpatient Medications  ?Medication Sig Dispense Refill  ? methylphenidate (RITALIN) 10 MG tablet Take 1 tablet (10 mg total) by mouth 2  (two) times daily with breakfast and lunch. 60 tablet 0  ? prazosin (MINIPRESS) 2 MG capsule Take 1 capsule (2 mg total) by mouth at bedtime. 30 capsule 2  ? amLODipine (NORVASC) 5 MG tablet TAKE 1 TABLET BY MOUTH  DAILY 90 tablet 3  ? aspirin 81 MG tablet Take 81 mg by mouth daily.    ? buPROPion (WELLBUTRIN XL) 300 MG 24 hr tablet Take 1 tablet (300 mg total) by  mouth every morning. 30 tablet 2  ? clonazePAM (KLONOPIN) 0.5 MG tablet Take 1 tablet (0.5 mg total) by mouth 2 (two) times daily as needed for anxiety. 60 tablet 2  ? DULoxetine (CYMBALTA) 60 MG capsule Take 2 capsules (120 mg total) by mouth daily. 180 capsule 3  ? ezetimibe (ZETIA) 10 MG tablet TAKE 1 TABLET BY MOUTH  DAILY 90 tablet 3  ? gabapentin (NEURONTIN) 600 MG tablet Take 1 tablet (600 mg total) by mouth 3 (three) times daily. 270 tablet 3  ? meclizine (ANTIVERT) 12.5 MG tablet Take 1 tablet (12.5 mg total) by mouth 3 (three) times daily as needed for dizziness. 30 tablet 0  ? metaxalone (SKELAXIN) 800 MG tablet Take one tablet by mouth two times daily for  Muscle spasm 60 tablet 5  ? mometasone (NASONEX) 50 MCG/ACT nasal spray USE 2 SPRAYS NASALLY DAILY 51 g 1  ? Multiple Vitamin (MULTIVITAMIN WITH MINERALS) TABS tablet Take 1 tablet by mouth daily.    ? oxybutynin (DITROPAN-XL) 10 MG 24 hr tablet Take 1 tablet (10 mg total) by mouth at bedtime. 90 tablet 1  ? rosuvastatin (CRESTOR) 40 MG tablet TAKE 1 TABLET BY MOUTH  DAILY 90 tablet 3  ? traMADol (ULTRAM) 50 MG tablet TAKE 1 TO 2 TABLETS BY  MOUTH UP TO 4 PILLS DAILY  AS NEEDED , BUT NO MORE THAN 90 TABLETS PER MONTH 270 tablet 1  ? zonisamide (ZONEGRAN) 100 MG capsule Take 1 capsule (100 mg total) by mouth daily. 30 capsule 11  ? ?No current facility-administered medications for this visit.  ? ?Facility-Administered Medications Ordered in Other Visits  ?Medication Dose Route Frequency Provider Last Rate Last Admin  ? gadopentetate dimeglumine (MAGNEVIST) injection 20 mL  20 mL Intravenous  Once PRN Sater, Nanine Means, MD      ? ? ? ?Musculoskeletal: ?Strength & Muscle Tone: decreased ?Gait & Station: unsteady ?Patient leans: N/A ? ?Psychiatric Specialty Exam: ?Review of Systems  ?Constitutional:

## 2021-07-24 ENCOUNTER — Ambulatory Visit (HOSPITAL_COMMUNITY): Payer: BC Managed Care – PPO | Admitting: Psychiatry

## 2021-08-06 ENCOUNTER — Ambulatory Visit (INDEPENDENT_AMBULATORY_CARE_PROVIDER_SITE_OTHER): Payer: BC Managed Care – PPO | Admitting: Psychiatry

## 2021-08-06 DIAGNOSIS — F331 Major depressive disorder, recurrent, moderate: Secondary | ICD-10-CM | POA: Diagnosis not present

## 2021-08-06 NOTE — Progress Notes (Signed)
Virtual Visit via Video Note ? ?I connected with Pamela Walters on 08/06/21 at 10:20 AM EDT  by a video enabled telemedicine application and verified that I am speaking with the correct person using two identifiers. ? ?Location: ?Patient: Home ?Provider: [facility] [place] office  ?  ?I discussed the limitations of evaluation and management by telemedicine and the availability of in person appointments. The patient expressed understanding and agreed to proceed. ? ? ? ?I provided 45 minutes of non-face-to-face time during this encounter. ? ? ?Damontre Millea E Jamarion [place], LCSW ? ? ?  ?               ?THERAPIST PROGRESS NOTE ?         ? ? ? ?Session Time:  Wednesday 08/06/2021 10:20 AM - 11:05 AM  ? ?Participation Level: Active ? ?Behavioral Response: Casual/ alert, anxious, depressed          ? ?Type of Therapy: Individual Therapy       ? ?Treatment Goals: Elevate mood and show evidence of usual energy, activities, and socialization level , resolve conflicted  feelings and adapt to new life circumstances AEB by patient implementing 3 new activities that increase a level of satisfaction consistently for 3 weeks patient identifying 5 advantages of current life situation                             ?                  ? ? ?Interventions: Supportive,  CBT ? ?Summary: Pamela MCARTHY is a 61 y.o. female who is referred for services by PCP Dr. Moshe Cipro due to patient suffering symptoms of depression. Patient has been seen in this office interminttently for medication management and outpatient psychotherapy since 2016. She is resuming services  due to increased stress related to family conflict and her job.  Patient's reports increased irritability, decreased interest in activities, excessive worry, poor concentration, tearfulness, fatigue, restlessness, loss of appetite, and poor motivation.    ?   ?Patient last was seen by virtual visit via video 4 weeks ago.    She reports feeling a little better since last session as she  reports increased socialization.  Per patient's report she enjoyed going to the beach for about a week.  She also reports enjoying going on a school trip with her son and her 34 year old granddaughter.  However, patient still reports poor motivation and difficulty pushing self to do things at home.  She continues to express frustration regarding her medical issues.  She continues to miss working.  She also reports stress related to experiencing knee pain.  She reports doctors have recommended knee replacements.  However, patient reports not being ready to do this.    ? ?Suicidal/Homicidal: No ? ?Therapist Response:  Reviewed symptoms, praised and reinforced patient increased socialization, discussed effects on mood/thoughts/behavior, discussed stressors, facilitated expression of thoughts and feelings, validated feelings, assisted patient identify ways to increase behavioral activation and maintain consistency, developed plan with patient to resume use of daily planning, developed plan with patient to participate in her hobby of doing crafts 3 times per week, assisted patient identify and address thoughts/processes that may inhibit implementation of plan, assisted patient develop a list of benefits for implementing plan/consequences of not implementing plan, developed plan with patient to read lists daily  ? ?Plan: Return again in 2 weeks.  ?          ?  Diagnosis: Axis I: Major Depressive Disorder, Recurrent, Moderate ? ?  Axis II: No diagnosis ? ?Collaboration of Care: Psychiatrist AEB by patient working with psychiatrist Dr. Harrington Challenger, encouraging patient to follow through with medication management appointment ? ?Patient/Guardian was advised Release of Information must be obtained prior to any record release in order to collaborate their care with an outside provider. Patient/Guardian was advised if they have not already done so to contact the registration department to sign all necessary forms in order for [place] to  release information regarding their care.  ? ?Consent: Patient/Guardian gives verbal consent for treatment and assignment of benefits for services provided during this visit. Patient/Guardian expressed understanding and agreed to proceed.  ? ?[place]ina [place], LCSW ?08/06/2021 ? ? ? ?

## 2021-08-11 ENCOUNTER — Telehealth: Payer: Self-pay | Admitting: Neurology

## 2021-08-11 ENCOUNTER — Ambulatory Visit: Payer: BC Managed Care – PPO | Admitting: Rheumatology

## 2021-08-11 NOTE — Telephone Encounter (Signed)
LVM and sent mychart message informing pt of appointment change- MD has appointment 4/18. ?

## 2021-08-12 ENCOUNTER — Ambulatory Visit: Payer: BC Managed Care – PPO | Admitting: Neurology

## 2021-08-13 ENCOUNTER — Ambulatory Visit: Payer: BC Managed Care – PPO | Admitting: Neurology

## 2021-08-28 ENCOUNTER — Ambulatory Visit (INDEPENDENT_AMBULATORY_CARE_PROVIDER_SITE_OTHER): Payer: BC Managed Care – PPO | Admitting: Psychiatry

## 2021-08-28 DIAGNOSIS — F331 Major depressive disorder, recurrent, moderate: Secondary | ICD-10-CM

## 2021-08-28 DIAGNOSIS — R42 Dizziness and giddiness: Secondary | ICD-10-CM | POA: Diagnosis not present

## 2021-08-28 NOTE — Progress Notes (Signed)
Virtual Visit via Telephone Note ? ?I connected with Pamela Walters on 08/28/21 at 4:15 PM EDT  by telephone and verified that I am speaking with the correct person using two identifiers. ? ?Location: ?Patient: Home ?Provider: [facility] [place] office  ?  ?I discussed the limitations, risks, security and privacy concerns of performing an evaluation and management service by telephone and the availability of in person appointments. I also discussed with the patient that there may be a patient responsible charge related to this service. The patient expressed understanding and agreed to proceed. ? ? ? ?I provided 48 minutes of non-face-to-face time during this encounter. ? ? ?Lilyahna Sirmon E Martina Brodbeck, LCSW ? ? ?  ?               ?THERAPIST PROGRESS NOTE ?         ? ? ? ?Session Time:  Thursday  08/29/2021 4:15 PM - 5:03 PM ? ?Participation Level: Active ? ?Behavioral Response: Casual/ alert, anxious, depressed          ? ?Type of Therapy: Individual Therapy       ? ?Treatment Goals: Elevate mood and show evidence of usual energy, activities, and socialization level , resolve conflicted  feelings and adapt to new life circumstances AEB by patient implementing 3 new activities that increase a level of satisfaction consistently for 3 weeks patient identifying 5 advantages of current life situation                             ?                  ?   ? ?Interventions: Supportive,  CBT ? ?Summary: Pamela Walters is a 61 y.o. female who is referred for services by PCP Dr. Moshe Cipro due to patient suffering symptoms of depression. Patient has been seen in this office interminttently for medication management and outpatient psychotherapy since 2016. She is resuming services  due to increased stress related to family conflict and her job.  Patient's reports increased irritability, decreased interest in activities, excessive worry, poor concentration, tearfulness, fatigue, restlessness, loss of appetite, and poor motivation.    ?    ?Patient last was seen by virtual visit via video 3-  4 weeks ago.    She reports increased stress since last session as she has experienced increased headaches due to a fall she had about 5 to 6 weeks ago.  She expresses frustration as she was recovering well from the fall she had in August prior to this last fall.  She reports difficulty trying to enjoy activities when she has a headache and reports that headaches occur daily.  She also expresses frustration doctors have been unable to determine the cause of these headaches.  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  She also reports missing working.  She has been using daily planning forms and increased behavioral activation.     ? ?Suicidal/Homicidal: No ? ?Therapist Response:  Reviewed symptoms, praised and reinforced patient increased behavioral activation, discussed effects on mood/thoughts/behavior, discussed stressors, facilitated expression of thoughts and feelings, validated feelings, discussed rationale for and assisted patient practice mindfulness activity (leaves on the stream) to help cope with ruminating thoughts, developed plan with patient to practice between sessions, therapist will send patient access code via mail  ?Plan:  Return again in 2 weeks.  ?          ?Diagnosis: Axis I: Major Depressive Disorder, Recurrent, Moderate ? ?  Axis II: No diagnosis ? ?Collaboration of Care: Psychiatrist AEB by patient working with psychiatrist Dr. Harrington Challenger, encouraging patient to follow through with medication management appointment ? ?Patient/Guardian was advised Release of Information must be obtained prior to any record release in order to collaborate their care with an outside provider. Patient/Guardian was advised if they have not already done so to contact the registration department to sign all necessary forms in order for [place] to release information regarding their  care.  ? ?Consent: Patient/Guardian gives verbal consent for treatment and assignment of benefits for services provided during this visit. Patient/Guardian expressed understanding and agreed to proceed.  ? ?[place], LCSW ?08/28/2021 ? ? ? ?

## 2021-09-04 DIAGNOSIS — H9042 Sensorineural hearing loss, unilateral, left ear, with unrestricted hearing on the contralateral side: Secondary | ICD-10-CM | POA: Diagnosis not present

## 2021-09-04 DIAGNOSIS — H9312 Tinnitus, left ear: Secondary | ICD-10-CM | POA: Diagnosis not present

## 2021-09-04 DIAGNOSIS — R42 Dizziness and giddiness: Secondary | ICD-10-CM | POA: Diagnosis not present

## 2021-09-09 ENCOUNTER — Telehealth (HOSPITAL_COMMUNITY): Payer: Self-pay | Admitting: *Deleted

## 2021-09-09 NOTE — Telephone Encounter (Signed)
LMOM for patient to call office and resch appt with Peggy ?

## 2021-09-11 ENCOUNTER — Ambulatory Visit (HOSPITAL_COMMUNITY): Payer: BC Managed Care – PPO | Admitting: Psychiatry

## 2021-09-18 ENCOUNTER — Ambulatory Visit: Payer: BC Managed Care – PPO | Admitting: Rheumatology

## 2021-09-18 DIAGNOSIS — R1314 Dysphagia, pharyngoesophageal phase: Secondary | ICD-10-CM

## 2021-09-18 DIAGNOSIS — F418 Other specified anxiety disorders: Secondary | ICD-10-CM

## 2021-09-18 DIAGNOSIS — R7989 Other specified abnormal findings of blood chemistry: Secondary | ICD-10-CM

## 2021-09-18 DIAGNOSIS — Z8669 Personal history of other diseases of the nervous system and sense organs: Secondary | ICD-10-CM

## 2021-09-18 DIAGNOSIS — E785 Hyperlipidemia, unspecified: Secondary | ICD-10-CM

## 2021-09-18 DIAGNOSIS — G5603 Carpal tunnel syndrome, bilateral upper limbs: Secondary | ICD-10-CM

## 2021-09-18 DIAGNOSIS — M8589 Other specified disorders of bone density and structure, multiple sites: Secondary | ICD-10-CM

## 2021-09-18 DIAGNOSIS — G4733 Obstructive sleep apnea (adult) (pediatric): Secondary | ICD-10-CM

## 2021-09-18 DIAGNOSIS — M170 Bilateral primary osteoarthritis of knee: Secondary | ICD-10-CM

## 2021-09-18 DIAGNOSIS — I10 Essential (primary) hypertension: Secondary | ICD-10-CM

## 2021-09-18 DIAGNOSIS — M5136 Other intervertebral disc degeneration, lumbar region: Secondary | ICD-10-CM

## 2021-09-18 DIAGNOSIS — M7712 Lateral epicondylitis, left elbow: Secondary | ICD-10-CM

## 2021-09-18 DIAGNOSIS — E559 Vitamin D deficiency, unspecified: Secondary | ICD-10-CM

## 2021-09-18 DIAGNOSIS — Z8616 Personal history of COVID-19: Secondary | ICD-10-CM

## 2021-09-18 DIAGNOSIS — M19041 Primary osteoarthritis, right hand: Secondary | ICD-10-CM

## 2021-09-24 ENCOUNTER — Ambulatory Visit: Payer: BC Managed Care – PPO | Admitting: Neurology

## 2021-09-24 ENCOUNTER — Encounter: Payer: Self-pay | Admitting: Neurology

## 2021-09-25 ENCOUNTER — Ambulatory Visit (HOSPITAL_COMMUNITY): Payer: BC Managed Care – PPO | Admitting: Psychiatry

## 2021-10-24 ENCOUNTER — Other Ambulatory Visit: Payer: Self-pay | Admitting: Family Medicine

## 2021-11-01 ENCOUNTER — Other Ambulatory Visit: Payer: Self-pay | Admitting: Family Medicine

## 2021-11-03 MED ORDER — MECLIZINE HCL 12.5 MG PO TABS: 12.5000 mg | ORAL_TABLET | Freq: Three times a day (TID) | ORAL | 0 refills | Status: DC | PRN

## 2021-11-11 ENCOUNTER — Other Ambulatory Visit (HOSPITAL_COMMUNITY): Payer: Self-pay | Admitting: Psychiatry

## 2021-11-11 MED ORDER — METHYLPHENIDATE HCL 10 MG PO TABS: 10.0000 mg | ORAL_TABLET | Freq: Two times a day (BID) | ORAL | 0 refills | Status: DC

## 2021-12-25 DIAGNOSIS — M170 Bilateral primary osteoarthritis of knee: Secondary | ICD-10-CM | POA: Diagnosis not present

## 2021-12-31 ENCOUNTER — Encounter: Payer: Self-pay | Admitting: Nurse Practitioner

## 2021-12-31 ENCOUNTER — Ambulatory Visit: Payer: BC Managed Care – PPO | Admitting: Nurse Practitioner

## 2021-12-31 VITALS — BP 131/80 | HR 80 | Ht 63.0 in | Wt 184.0 lb

## 2021-12-31 DIAGNOSIS — Z23 Encounter for immunization: Secondary | ICD-10-CM

## 2021-12-31 DIAGNOSIS — M170 Bilateral primary osteoarthritis of knee: Secondary | ICD-10-CM

## 2021-12-31 DIAGNOSIS — E669 Obesity, unspecified: Secondary | ICD-10-CM

## 2021-12-31 DIAGNOSIS — I10 Essential (primary) hypertension: Secondary | ICD-10-CM

## 2021-12-31 DIAGNOSIS — E785 Hyperlipidemia, unspecified: Secondary | ICD-10-CM | POA: Diagnosis not present

## 2021-12-31 DIAGNOSIS — E559 Vitamin D deficiency, unspecified: Secondary | ICD-10-CM

## 2021-12-31 DIAGNOSIS — F418 Other specified anxiety disorders: Secondary | ICD-10-CM

## 2021-12-31 NOTE — Assessment & Plan Note (Signed)
BP Readings from Last 3 Encounters:  12/31/21 131/80  04/10/21 121/78  02/11/21 129/82  Chronic medical condition well-controlled on amlodipine 5 mg daily Continue current medication DASH diet advised engage in regular daily exercises as tolerated BMP today Follow-up in 6 months

## 2021-12-31 NOTE — Assessment & Plan Note (Signed)
Patient educated on CDC recommendation for the vaccine. Verbal consent was obtained from the patient, vaccine administered by nurse, no sign of adverse reactions noted at this time. Patient education on arm soreness and use of tylenol  for this patient  was discussed. Patient educated on the signs and symptoms of adverse effect and advise to contact the office if they occur.  ?

## 2021-12-31 NOTE — Patient Instructions (Signed)
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
## 2021-12-31 NOTE — Assessment & Plan Note (Signed)
Currently on Zetia 10 mg daily, Crestor 40 mg daily Continue current medications avoid fatty fried foods Check lipid panel

## 2021-12-31 NOTE — Assessment & Plan Note (Signed)
Bilateral knee arthritis  Has upcoming knee replacement surgery  continue tramadol '50mg'$ -'100mg'$   daily as needed

## 2021-12-31 NOTE — Assessment & Plan Note (Signed)
Chronic condition.  Has been worried about the recent events happening in [place] area, Denies SI, HI Has been following up regularly with psych. Patient encouraged to maintain close follow up with psych  Currently on clonazepam 0.'5mg'$  BID PRN CYMBALTA '120mg'$  daily

## 2021-12-31 NOTE — Assessment & Plan Note (Addendum)
Wt Readings from Last 3 Encounters:  12/31/21 184 lb (83.5 kg)  06/04/21 171 lb (77.6 kg)  04/10/21 184 lb (83.5 kg)  has not been able to exercise due to her bilateral knee arthritis pt counseled on low carb diet.

## 2021-12-31 NOTE — Assessment & Plan Note (Signed)
Check Vitamin D level Currently on vitamin D 2000 units daily .

## 2022-01-06 ENCOUNTER — Encounter: Payer: Self-pay | Admitting: Family Medicine

## 2022-01-06 ENCOUNTER — Telehealth: Payer: Self-pay

## 2022-01-06 ENCOUNTER — Telehealth: Payer: Self-pay | Admitting: Family Medicine

## 2022-01-06 NOTE — Telephone Encounter (Signed)
Left voice mail to call office to schedule appointment for medical clearance with Dr Moshe Cipro.

## 2022-01-06 NOTE — Telephone Encounter (Signed)
I tried calling the pt , for follow up, needs labs, I missed her last week. If she calls back pls send me a message , I would like to speak with her please offer a reasonable time when she iss avaialavble

## 2022-01-08 NOTE — Telephone Encounter (Signed)
Reached patient,[phone],Patient is scheduled to see you 01/14/22 for medical clearance.

## 2022-01-14 ENCOUNTER — Encounter: Payer: Self-pay | Admitting: Family Medicine

## 2022-01-14 ENCOUNTER — Ambulatory Visit (HOSPITAL_COMMUNITY)
Admission: RE | Admit: 2022-01-14 | Discharge: 2022-01-14 | Disposition: A | Discharge status: Home / Self Care | Payer: BC Managed Care – PPO | Source: Ambulatory Visit | Attending: Family Medicine | Admitting: Family Medicine

## 2022-01-14 ENCOUNTER — Ambulatory Visit: Payer: BC Managed Care – PPO | Admitting: Family Medicine

## 2022-01-14 VITALS — BP 118/73 | HR 94 | Ht 63.0 in | Wt 180.1 lb

## 2022-01-14 DIAGNOSIS — R9431 Abnormal electrocardiogram [ECG] [EKG]: Secondary | ICD-10-CM

## 2022-01-14 DIAGNOSIS — E785 Hyperlipidemia, unspecified: Secondary | ICD-10-CM | POA: Diagnosis not present

## 2022-01-14 DIAGNOSIS — I10 Essential (primary) hypertension: Secondary | ICD-10-CM

## 2022-01-14 DIAGNOSIS — E559 Vitamin D deficiency, unspecified: Secondary | ICD-10-CM

## 2022-01-14 DIAGNOSIS — Z01818 Encounter for other preprocedural examination: Secondary | ICD-10-CM

## 2022-01-14 DIAGNOSIS — F418 Other specified anxiety disorders: Secondary | ICD-10-CM

## 2022-01-14 DIAGNOSIS — Z01810 Encounter for preprocedural cardiovascular examination: Secondary | ICD-10-CM

## 2022-01-14 LAB — POCT URINALYSIS DIP (CLINITEK)
Bilirubin, UA: NEGATIVE
Glucose, UA: NEGATIVE mg/dL
Ketones, POC UA: NEGATIVE mg/dL
Leukocytes, UA: NEGATIVE
Nitrite, UA: NEGATIVE
POC PROTEIN,UA: 100 — AB
Spec Grav, UA: 1.025 (ref 1.010–1.025)
Urobilinogen, UA: 0.2 E.U./dL
pH, UA: 6 (ref 5.0–8.0)

## 2022-01-14 NOTE — Patient Instructions (Addendum)
Annual exam as scheduled, call if you need me sooner  CXR today  Mammogram to be scheduled   at checkout  Labs today after 1, nurse to re order, CBC, lipid, cmp and eGFr, TSH, Vit D ( or add too existing order)   You are being referred to Dr Harl Bowie for Cardiology clearance  Please resubmit urine when hydrated , no trauma, for rept cCCUA through lab, nurse to order, in next 2 weeks  Thanks for choosing  Primary Care, we consider it a privelige to serve you.   All the  BEST with surgery, I believe that you will do well

## 2022-01-14 NOTE — Progress Notes (Signed)
   Pamela Walters     MRN: [id number]      DOB: [birth year]   HPI Pamela Walters is here for pre op clearance  for left knee replacement proposed for Nov 23, also for f/u of chronic conditions ROS Denies recent fever or chills. Denies sinus pressure, nasal congestion, ear pain or sore throat. Denies chest congestion, productive cough or wheezing. Denies chest pains, palpitations and leg swelling Denies abdominal pain, nausea, vomiting,diarrhea or constipation.   Denies dysuria, frequency, hesitancy or incontinence. C/o  joint pain, left knee,  and limitation in mobility. Denies headaches, seizures, numbness, or tingling. Denies uncontrolled depression, anxiety or insomnia. Denies skin break down or rash.   PE  BP 118/73 (BP Location: Right Arm, Patient Position: Sitting, Cuff Size: Large)   Pulse 94   Ht '5\' 3"'$  (1.6 m)   Wt 180 lb 1.3 oz (81.7 kg)   SpO2 95%   BMI 31.90 kg/m   Patient alert and oriented and in no cardiopulmonary distress.  HEENT: No facial asymmetry, EOMI,     Neck supple .  Chest: Clear to auscultation bilaterally.  CVS: S1, S2 no murmurs, no S3.Regular rate. EKG: possible  AV block, refer to cardiology for clearance ABD: Soft non tender.   Ext: No edema  MS: Adequate ROM spine, shoulders, hips and markedly reduced In left knee.  Skin: Intact, no ulcerations or rash noted.  Psych: Good eye contact, normal affect. Memory intact not anxious or depressed appearing.  CNS: CN 2-12 intact, power,  normal throughout.no focal deficits noted.   Assessment & Plan  Pre-op evaluation Abnormal EKG, will refer for cardiac clearance CXR, CCUA and labs ordered and will be followed up History and exam clear her medically Meds and allergies reviewed and updated  Hyperlipidemia LDL goal <100 Hyperlipidemia:Low fat diet discussed and encouraged.   Lipid Panel  Lab Results  Component Value Date   CHOL 153 01/14/2022   HDL 39 (L) 01/14/2022   LDLCALC 81  01/14/2022   TRIG 197 (H) 01/14/2022   CHOLHDL 3.9 01/14/2022  need sto reduce fat in diet     Depression with anxiety Managed by Neurology and controlled   Essential hypertension Controlled, no change in medication DASH diet and commitment to daily physical activity for a minimum of 30 minutes discussed and encouraged, as a part of hypertension management. The importance of attaining a healthy weight is also discussed.     01/14/2022   11:49 AM 12/31/2021   11:20 AM 06/04/2021    1:19 PM 04/10/2021    1:11 PM 02/11/2021   11:04 AM 01/10/2021    9:16 AM 12/11/2020    8:50 AM  BP/Weight  Systolic BP 462 703  500 938 182 993  Diastolic BP 73 80  78 82 76 75  Wt. (Lbs) 180.08 184 171 184 178 171 170.12  BMI 31.9 kg/m2 32.59 kg/m2 30.29 kg/m2 32.59 kg/m2 31.53 kg/m2 30.29 kg/m2 30.14 kg/m2

## 2022-01-15 LAB — LIPID PANEL
Chol/HDL Ratio: 3.9 ratio (ref 0.0–4.4)
Cholesterol, Total: 153 mg/dL (ref 100–199)
HDL: 39 mg/dL — ABNORMAL LOW (ref >39)
LDL Chol Calc (NIH): 81 mg/dL (ref 0–99)
Triglycerides: 197 mg/dL — ABNORMAL HIGH (ref 0–149)
VLDL Cholesterol Cal: 33 mg/dL (ref 5–40)

## 2022-01-15 LAB — CMP14+EGFR
ALT: 23 IU/L (ref 0–32)
AST: 29 IU/L (ref 0–40)
Albumin/Globulin Ratio: 1.9 (ref 1.2–2.2)
Albumin: 4.6 g/dL (ref 3.9–4.9)
Alkaline Phosphatase: 104 IU/L (ref 44–121)
BUN/Creatinine Ratio: 13 (ref 12–28)
BUN: 11 mg/dL (ref 8–27)
Bilirubin Total: 0.5 mg/dL (ref 0.0–1.2)
CO2: 25 mmol/L (ref 20–29)
Calcium: 9.7 mg/dL (ref 8.7–10.3)
Chloride: 98 mmol/L (ref 96–106)
Creatinine, Ser: 0.88 mg/dL (ref 0.57–1.00)
Globulin, Total: 2.4 g/dL (ref 1.5–4.5)
Glucose: 105 mg/dL — ABNORMAL HIGH (ref 70–99)
Potassium: 3.2 mmol/L — ABNORMAL LOW (ref 3.5–5.2)
Sodium: 140 mmol/L (ref 134–144)
Total Protein: 7 g/dL (ref 6.0–8.5)
eGFR: 74 mL/min/{1.73_m2} (ref >59)

## 2022-01-15 LAB — CBC
Hematocrit: 38.5 % (ref 34.0–46.6)
Hemoglobin: 12.5 g/dL (ref 11.1–15.9)
MCH: 25.4 pg — ABNORMAL LOW (ref 26.6–33.0)
MCHC: 32.5 g/dL (ref 31.5–35.7)
MCV: 78 fL — ABNORMAL LOW (ref 79–97)
Platelets: 319 10*3/uL (ref 150–450)
RBC: 4.92 x10E6/uL (ref 3.77–5.28)
RDW: 16.1 % — ABNORMAL HIGH (ref 11.7–15.4)
WBC: 9 10*3/uL (ref 3.4–10.8)

## 2022-01-15 LAB — TSH: TSH: 1.99 u[IU]/mL (ref 0.450–4.500)

## 2022-01-15 LAB — VITAMIN D 25 HYDROXY (VIT D DEFICIENCY, FRACTURES): Vit D, 25-Hydroxy: 29.9 ng/mL — ABNORMAL LOW (ref 30.0–100.0)

## 2022-01-17 LAB — URINE CULTURE

## 2022-01-17 MED ORDER — POTASSIUM CHLORIDE CRYS ER 20 MEQ PO TBCR: 20.0000 meq | EXTENDED_RELEASE_TABLET | Freq: Every day | ORAL | 3 refills | Status: DC

## 2022-01-19 ENCOUNTER — Encounter: Payer: Self-pay | Admitting: Family Medicine

## 2022-01-19 DIAGNOSIS — Z01818 Encounter for other preprocedural examination: Secondary | ICD-10-CM | POA: Insufficient documentation

## 2022-01-19 NOTE — Assessment & Plan Note (Signed)
Abnormal EKG, will refer for cardiac clearance CXR, CCUA and labs ordered and will be followed up History and exam clear her medically Meds and allergies reviewed and updated

## 2022-01-19 NOTE — Assessment & Plan Note (Signed)
Hyperlipidemia:Low fat diet discussed and encouraged.   Lipid Panel  Lab Results  Component Value Date   CHOL 153 01/14/2022   HDL 39 (L) 01/14/2022   LDLCALC 81 01/14/2022   TRIG 197 (H) 01/14/2022   CHOLHDL 3.9 01/14/2022  need sto reduce fat in diet

## 2022-01-19 NOTE — Assessment & Plan Note (Signed)
Managed by Neurology and controlled

## 2022-01-19 NOTE — Assessment & Plan Note (Signed)
Controlled, no change in medication DASH diet and commitment to daily physical activity for a minimum of 30 minutes discussed and encouraged, as a part of hypertension management. The importance of attaining a healthy weight is also discussed.     01/14/2022   11:49 AM 12/31/2021   11:20 AM 06/04/2021    1:19 PM 04/10/2021    1:11 PM 02/11/2021   11:04 AM 01/10/2021    9:16 AM 12/11/2020    8:50 AM  BP/Weight  Systolic BP 149 969  249 324 199 144  Diastolic BP 73 80  78 82 76 75  Wt. (Lbs) 180.08 184 171 184 178 171 170.12  BMI 31.9 kg/m2 32.59 kg/m2 30.29 kg/m2 32.59 kg/m2 31.53 kg/m2 30.29 kg/m2 30.14 kg/m2

## 2022-02-04 ENCOUNTER — Other Ambulatory Visit (INDEPENDENT_AMBULATORY_CARE_PROVIDER_SITE_OTHER): Payer: BC Managed Care – PPO

## 2022-02-04 DIAGNOSIS — R319 Hematuria, unspecified: Secondary | ICD-10-CM

## 2022-02-04 LAB — POCT URINALYSIS DIP (CLINITEK)
Bilirubin, UA: NEGATIVE
Blood, UA: NEGATIVE
Glucose, UA: NEGATIVE mg/dL
Ketones, POC UA: NEGATIVE mg/dL
Leukocytes, UA: NEGATIVE
Nitrite, UA: NEGATIVE
Spec Grav, UA: 1.025 (ref 1.010–1.025)
Urobilinogen, UA: 0.2 E.U./dL
pH, UA: 7 (ref 5.0–8.0)

## 2022-02-13 ENCOUNTER — Ambulatory Visit: Payer: BC Managed Care – PPO | Attending: Cardiovascular Disease | Admitting: Cardiovascular Disease

## 2022-02-13 DIAGNOSIS — I10 Essential (primary) hypertension: Secondary | ICD-10-CM

## 2022-02-13 DIAGNOSIS — R0602 Shortness of breath: Secondary | ICD-10-CM

## 2022-02-13 DIAGNOSIS — Z01818 Encounter for other preprocedural examination: Secondary | ICD-10-CM

## 2022-02-13 DIAGNOSIS — G4733 Obstructive sleep apnea (adult) (pediatric): Secondary | ICD-10-CM

## 2022-02-13 DIAGNOSIS — R9431 Abnormal electrocardiogram [ECG] [EKG]: Secondary | ICD-10-CM | POA: Diagnosis not present

## 2022-02-13 DIAGNOSIS — E782 Mixed hyperlipidemia: Secondary | ICD-10-CM

## 2022-02-13 DIAGNOSIS — R011 Cardiac murmur, unspecified: Secondary | ICD-10-CM

## 2022-02-13 NOTE — Patient Instructions (Addendum)
Medication Instructions:  The current medical regimen is effective;  continue present plan and medications as directed.  Please refer to the Current Medication list given to you today.  *If you need a refill on your cardiac medications before your next appointment, please call your pharmacy*  TESTING: Echocardiogram - Your physician has requested that you have an echocardiogram. Echocardiography is a painless test that uses sound waves to create images of your heart. It provides your doctor with information about the size and shape of your heart and how well your heart's chambers and valves are working. This procedure takes approximately one hour. There are no restrictions for this procedure.   Other Instructions CLEARED FOR UPCOMING SURGERY  Follow-Up: At [facility], you and your health needs are our priority.  As part of our continuing mission to provide you with exceptional heart care, we have created designated Provider Care Teams.  These Care Teams include your primary Cardiologist (physician) and Advanced Practice Providers (APPs -  Physician Assistants and Nurse Practitioners) who all work together to provide you with the care you need, when you need it.  Your next appointment:   AFTER ECHO    The format for your next appointment:   In Person  Provider:   Shelva Majestic, MD   Important Information About Sugar

## 2022-02-13 NOTE — Progress Notes (Signed)
Cardiology Office Note    Date:  02/20/2022   ID:  Pamela Walters, DOB 1961/01/30, MRN [id number]  PCP:  No primary care provider on file.  Cardiologist:  Shelva Majestic, MD   New cardiology consultation referred by [name] for preoperative evaluation prior to undergoing left knee replacement.   History of Present Illness:  Pamela Walters is a 61 y.o. female who is followed by Dr. [name] for primary care.  She is scheduled to undergo left knee replacement on March 19, 2022 by Dr. Sandie Ano.  She has a history of mild obesity, hyperlipidemia, hypertension, neuropathy, and anxiety depression.  Most recently she has been on amlodipine 5 mg as well as prazosin 2 mg at bedtime for hypertension.  She is on rosuvastatin 40 mg and Zetia for hyperlipidemia.  She has been on bupropion and clonazepam in addition to Cymbalta for anxiety and depression and gabapentin for neuropathy.  She has a history of obstructive sleep apnea and previously was followed at [facility] neurology by Dr. Richardean Chimera.  Apparently, a sleep study was done on August 16, 2014 and she was found to have an apnea-hypopnea index of 37.3/h and RDI of 38.3/h.  Oxygen desaturated to 87%.  She apparently had been on CPAP therapy and apparently has not used therapy since April 4.  From April 1 through October 10, 2021 she only used therapy for 2 days.  She is in need to undergo left knee replacement.  Remotely she had experienced nonexertional chest discomfort in 2019.  She denies any recent chest pain.  She does note some occasional shortness of breath.  She admits to occasional nightmares.  She presents for preoperative evaluation.   Past Medical History:  Diagnosis Date   Allergy    Anemia    Anxiety    Arthritis    Back pain    Bell's palsy 08/2017   Depression    Depression    Phreesia 05/19/2020   GERD (gastroesophageal reflux disease)    Hepatic steatosis    Hiatal hernia    History of Bell's  palsy 02/11/2018   Hyperlipidemia    Hypertension    Internal hemorrhoids    MS (multiple sclerosis) ([place])    2007   Multiple sclerosis ([place])    Multiple sclerosis ([place]) 04/04/2008   Qualifier: Diagnosis of  By: Moshe Cipro MD, Margaret     Schatzki's ring    Sleep apnea    wears CPAP    Past Surgical History:  Procedure Laterality Date   ABDOMINAL HYSTERECTOMY  2005   fibroids   CARDIAC CATHETERIZATION N/A 05/04/2016   Procedure: Left Heart Cath and Coronary Angiography;  Surgeon: Jettie Booze, MD;  Location: [facility];  Service: Cardiovascular;  Laterality: N/A;   COLONOSCOPY     ESOPHAGOGASTRODUODENOSCOPY N/A 09/18/2013   Procedure: ESOPHAGOGASTRODUODENOSCOPY (EGD);  Surgeon: Jerene Bears, MD;  Location: [facility];  Service: Gastroenterology;  Laterality: N/A;   UPPER GASTROINTESTINAL ENDOSCOPY     WISDOM TOOTH EXTRACTION      Current Medications: Outpatient Medications Prior to Visit  Medication Sig Dispense Refill   amLODipine (NORVASC) 5 MG tablet TAKE 1 TABLET BY MOUTH  DAILY 90 tablet 3   aspirin 81 MG tablet Take 81 mg by mouth daily.     ezetimibe (ZETIA) 10 MG tablet TAKE 1 TABLET BY MOUTH  DAILY 90 tablet 3   gabapentin (NEURONTIN) 600 MG tablet Take 1 tablet (600 mg  total) by mouth 3 (three) times daily. 270 tablet 3   meclizine (ANTIVERT) 12.5 MG tablet Take 1 tablet (12.5 mg total) by mouth 3 (three) times daily as needed for dizziness. 30 tablet 0   metaxalone (SKELAXIN) 800 MG tablet Take one tablet by mouth two times daily for  Muscle spasm 60 tablet 5   methylphenidate (RITALIN) 10 MG tablet Take 1 tablet (10 mg total) by mouth 2 (two) times daily with breakfast and lunch. 60 tablet 0   mometasone (NASONEX) 50 MCG/ACT nasal spray USE 2 SPRAYS NASALLY DAILY 51 g 1   Multiple Vitamin (MULTIVITAMIN WITH MINERALS) TABS tablet Take 1 tablet by mouth daily.     oxybutynin (DITROPAN-XL) 10 MG 24 hr tablet Take 1 tablet (10 mg total) by mouth at bedtime.  90 tablet 1   potassium chloride SA (KLOR-CON M) 20 MEQ tablet Take 1 tablet (20 mEq total) by mouth daily. 30 tablet 3   rosuvastatin (CRESTOR) 40 MG tablet TAKE 1 TABLET BY MOUTH  DAILY 90 tablet 3   traMADol (ULTRAM) 50 MG tablet TAKE 1 TO 2 TABLETS BY  MOUTH UP TO 4 PILLS DAILY  AS NEEDED , BUT NO MORE THAN 90 TABLETS PER MONTH 270 tablet 1   buPROPion (WELLBUTRIN XL) 300 MG 24 hr tablet Take 1 tablet (300 mg total) by mouth every morning. 30 tablet 2   clonazePAM (KLONOPIN) 0.5 MG tablet Take 1 tablet (0.5 mg total) by mouth 2 (two) times daily as needed for anxiety. 60 tablet 2   DULoxetine (CYMBALTA) 60 MG capsule Take 2 capsules (120 mg total) by mouth daily. 180 capsule 3   prazosin (MINIPRESS) 2 MG capsule Take 1 capsule (2 mg total) by mouth at bedtime. 30 capsule 2   zonisamide (ZONEGRAN) 100 MG capsule Take 1 capsule (100 mg total) by mouth daily. 30 capsule 11   Facility-Administered Medications Prior to Visit  Medication Dose Route Frequency Provider Last Rate Last Admin   gadopentetate dimeglumine (MAGNEVIST) injection 20 mL  20 mL Intravenous Once PRN Sater, Nanine Means, MD         Allergies:   Patient has no known allergies.   Social History   Socioeconomic History   Marital status: Married    Spouse name: Not on file   Number of children: Not on file   Years of education: Not on file   Highest education level: Not on file  Occupational History   Not on file  Tobacco Use   Smoking status: Never   Smokeless tobacco: Never  Vaping Use   Vaping Use: Never used  Substance and Sexual Activity   Alcohol use: No   Drug use: No   Sexual activity: Yes    Birth control/protection: Surgical    Comment: hyst  Other Topics Concern   Not on file  Social History Narrative   Not on file   Social Determinants of Health   Financial Resource Strain: Low Risk  (06/05/2020)   Overall Financial Resource Strain (CARDIA)    Difficulty of Paying Living Expenses: Not very hard   Food Insecurity: No Food Insecurity (06/05/2020)   Hunger Vital Sign    Worried About Running Out of Food in the Last Year: Never true    [place] in the Last Year: Never true  Transportation Needs: No Transportation Needs (06/05/2020)   PRAPARE - Hydrologist (Medical): No    Lack of Transportation (Non-Medical): No  Physical  Activity: Insufficiently Active (06/05/2020)   Exercise Vital Sign    Days of Exercise per Week: 3 days    Minutes of Exercise per Session: 30 min  Stress: Stress Concern Present (06/05/2020)   [place]    Feeling of Stress : Very much  Social Connections: Moderately Isolated (06/05/2020)   Social Connection and Isolation Panel [NHANES]    Frequency of Communication with Friends and Family: Once a week    Frequency of Social Gatherings with Friends and Family: Once a week    Attends Religious Services: More than 4 times per year    Active Member of [employer] or Organizations: No    Attends Archivist Meetings: Never    Marital Status: Married    Socially, she previously worked as a Marine scientist in the emergency room.  She is married for 48 years.  Has 2 children 6 grandchildren and 1 great grandchild.  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  There is no tobacco history.  She does not drink alcohol.  She has not been exercising due to knee discomfort.  Family History:  The patient's family history includes Depression in her mother; Healthy in her daughter and son; High Cholesterol in her sister; Hyperlipidemia in her mother; Hypertension in her father, mother, and sister.  Both parents are deceased mother died at age 78 and had an aortic aneurysm father died at age 28 with myocardial infarction.  She has 1 sister.  ROS General: Negative; No fevers, chills, or night sweats;  HEENT: Negative; No changes in vision or hearing, sinus congestion, difficulty  swallowing Pulmonary: Negative; No cough, wheezing, shortness of breath, hemoptysis Cardiovascular: Negative; No chest pain, presyncope, syncope, palpitations GI: Negative; No nausea, vomiting, diarrhea, or abdominal pain GU: Negative; No dysuria, hematuria, or difficulty voiding Musculoskeletal: Negative; no myalgias, joint pain, or weakness Hematologic/Oncology: Negative; no easy bruising, bleeding Endocrine: Negative; no heat/cold intolerance; no diabetes Neuro: Negative; no changes in balance, headaches Skin: Negative; No rashes or skin lesions Psychiatric: Negative; No behavioral problems, depression Sleep: Positive for OSA, not compliant and has not used CPAP for almost 7 months positive for snoring.  She denies daytime sleepiness.  No bruxism, hypnogognic hallucinations, no cataplexy Other comprehensive 14 point system review is negative.   PHYSICAL EXAM:   VS:  BP 138/78 (BP Location: Left Arm, Patient Position: Sitting, Cuff Size: Normal)   Pulse 74   Ht $R'5\' 3"'kc$  (1.6 m)   Wt 180 lb 6.4 oz (81.8 kg)   SpO2 97%   BMI 31.96 kg/m     Repeat blood pressure by me was 124/76.  Wt Readings from Last 3 Encounters:  02/13/22 180 lb 6.4 oz (81.8 kg)  01/14/22 180 lb 1.3 oz (81.7 kg)  12/31/21 184 lb (83.5 kg)    General: Alert, oriented, no distress.  Skin: normal turgor, no rashes, warm and dry HEENT: Normocephalic, atraumatic. Pupils equal round and reactive to light; sclera anicteric; extraocular muscles intact; Nose without nasal septal hypertrophy Mouth/Parynx benign; Mallinpatti scale 3 Neck: No JVD, no carotid bruits; normal carotid upstroke Lungs: clear to ausculatation and percussion; no wheezing or rales Chest wall: without tenderness to palpitation Heart: PMI not displaced, RRR, s1 s2 normal, 1/6 systolic murmur at apex; no diastolic murmur, no rubs, gallops, thrills, or heaves Abdomen: soft, nontender; no hepatosplenomehaly, BS+; abdominal aorta nontender and not  dilated by palpation. Back: no CVA tenderness Pulses 2+ Musculoskeletal: full range  of motion, normal strength, no joint deformities Extremities: no clubbing cyanosis or edema, Homan's sign negative  Neurologic: grossly nonfocal; Cranial nerves grossly wnl Psychologic: Normal mood and affect   Studies/Labs Reviewed:   February 13, 2022 ECG (independently read by me): NSR at 74; PRWP V1-3, normal intervals  Recent Labs:    Latest Ref Rng & Units 01/14/2022    1:34 PM 12/11/2020   10:25 AM 06/05/2020    2:32 PM  BMP  Glucose 70 - 99 mg/dL 105  79  81   BUN 8 - 27 mg/dL _0 Creatinine 0.57 - 1.00 mg/dL 0.88  0.83  1.06   BUN/Creat Ratio 12 - _1 Sodium 134 - 144 mmol/L 140  140  141   Potassium 3.5 - 5.2 mmol/L 3.2  3.8  3.8   Chloride 96 - 106 mmol/L 98  99  96   CO2 20 - 29 mmol/L _2 Calcium 8.7 - 10.3 mg/dL 9.7  9.7  9.8         Latest Ref Rng & Units 01/14/2022    1:34 PM 12/11/2020   10:25 AM 06/05/2020    2:32 PM  Hepatic Function  Total Protein 6.0 - 8.5 g/dL 7.0  7.3  7.3   Albumin 3.9 - 4.9 g/dL 4.6  4.7  4.8   AST 0 - 40 IU/L _3 ALT 0 - 32 IU/L _4 Alk Phosphatase 44 - 121 IU/L 104  109  111   Total Bilirubin 0.0 - 1.2 mg/dL 0.5  0.3  0.3        Latest Ref Rng & Units 01/14/2022    1:34 PM 02/06/2020   10:28 AM 08/10/2019    2:03 PM  CBC  WBC 3.4 - 10.8 x10E3/uL 9.0  6.9  7.1   Hemoglobin 11.1 - 15.9 g/dL 12.5  13.1  12.6   Hematocrit 34.0 - 46.6 % 38.5  40.6  39.1   Platelets 150 - 450 x10E3/uL 319  363  357    Lab Results  Component Value Date   MCV 78 (L) 01/14/2022   MCV 81 02/06/2020   MCV 81.5 08/10/2019   Lab Results  Component Value Date   TSH 1.990 01/14/2022   Lab Results  Component Value Date   HGBA1C 5.5 05/10/2017     BNP No results found for: "BNP"  ProBNP No results found for: "PROBNP"   Lipid Panel     Component Value Date/Time   CHOL 153 01/14/2022 1334   TRIG 197 (H)  01/14/2022 1334   HDL 39 (L) 01/14/2022 1334   CHOLHDL 3.9 01/14/2022 1334   CHOLHDL 2.7 08/10/2019 1403   VLDL 34 (H) 06/30/2016 1611   LDLCALC 81 01/14/2022 1334   LDLCALC 65 08/10/2019 1403   LABVLDL 33 01/14/2022 1334     RADIOLOGY: No results found.   Additional studies/ records that were reviewed today include:   I reviewed the [facility] neurology sleep evaluation from August 16, 2014.  Records of Dr. [name] were reviewed.   ASSESSMENT:    1. Preoperative clearance   2. Essential hypertension   3. OSA (obstructive sleep apnea)   4. Abnormal EKG   5. SOB (shortness of breath)   6. Systolic murmur   7. Mixed hyperlipidemia     PLAN:  Ms. Leilynn Pilat  is a 61 year old female who has a history of hypertension, anxiety/depression, neuropathy, hyperlipidemia and obstructive sleep apnea.  She was diagnosed with obstructive sleep apnea in 2016 and is periodically seeing Dr. Felecia Shelling at [facility] neurology.  She was found to have severe obstructive sleep apnea with AHI of 37.3 on sleep evaluation on August 16, 2014.  She admits to only intermittent compliance but basically has not used therapy in over 6 months.  She has had difficulty with her knees and is in need for left knee replacement surgery.  She does not have any chest pain.  She does admit to mild shortness of breath and on exam there is a soft murmur at her apex which I suspect may represent very mild mitral regurgitation.  Her ECG shows normal sinus rhythm at 74 bpm with poor R wave progression V1 through V3.  I have recommended prior to her surgery she undergo a preoperative 2D echo Doppler study to assess LV size and function, left ventricular wall motion particularly with poor R wave progression on her ECG in leads V1 through V3, valvular architecture and pulmonary pressures particularly with her untreated sleep apnea.  She recently saw Dr. [name].  Her blood pressure today on repeat by me is excellent at  124/76 although upon arrival it was slightly higher at 138/78.  She continues to be on amlodipine 5 mg for blood pressure control and also takes prazosin 2 mg.  She states she takes the prazosin for nightmares rather than hypertension.  She has been taking bupropion, as needed clonazepam, and duloxetine for her anxiety/depression.  She is on gabapentin for neuropathy.  She has hyperlipidemia on combination therapy with Zetia 10 mg and rosuvastatin 40 mg.  Recent laboratory by Dr. Moshe Cipro on January 14, 2022 showed total cholesterol 153 triglycerides 197 HDL 39 and LDL 81.  I discussed the importance of improved diet with particular reduction in carbohydrates and sweets.  She may need additional therapy with Vascepa if triglycerides remain elevated.  Additional laboratory when seen by Dr. Moshe Cipro showed low potassium at 3.2 as well as mild microcytic indices with an MCV of 78 but hemoglobin 12.5 hematocrit 38.5.  I also spent considerable time with her today and stressed the importance that she resume CPAP therapy.  I had a long discussion regarding potential adverse cardiovascular consequences of untreated sleep apnea including potential effects on blood pressure elevation, nocturnal arrhythmias, increased risk for atrial fibrillation as well as its negative effects on insulin resistance, potential for increased inflammatory markers and incidence of nocturnal GERD.  In addition I discussed potential nocturnal hypoxemia contributing to nocturnal ischemia both cardiac as well as cerebrovascular.  She was not aware of all these complications associated with untreated sleep apnea and was very appreciative of the time I spent informing her of the importance of resuming therapy.   I will contact her regarding the results of her echo Doppler study.  I have given her preoperative clearance for her upcoming knee replacement surgery as long as there is no significant major abnormalities noted on her echocardiographic  evaluation.   Medication Adjustments/Labs and Tests Ordered: Current medicines are reviewed at length with the patient today.  Concerns regarding medicines are outlined above.  Medication changes, Labs and Tests ordered today are listed in the Patient Instructions below. Patient Instructions  Medication Instructions:  The current medical regimen is effective;  continue present plan and medications as directed.  Please refer to the Current Medication list given to you today.  *  If you need a refill on your cardiac medications before your next appointment, please call your pharmacy*  TESTING: Echocardiogram - Your physician has requested that you have an echocardiogram. Echocardiography is a painless test that uses sound waves to create images of your heart. It provides your doctor with information about the size and shape of your heart and how well your heart's chambers and valves are working. This procedure takes approximately one hour. There are no restrictions for this procedure.   Other Instructions CLEARED FOR UPCOMING SURGERY  Follow-Up: At [facility], you and your health needs are our priority.  As part of our continuing mission to provide you with exceptional heart care, we have created designated Provider Care Teams.  These Care Teams include your primary Cardiologist (physician) and Advanced Practice Providers (APPs -  Physician Assistants and Nurse Practitioners) who all work together to provide you with the care you need, when you need it.  Your next appointment:   AFTER ECHO    The format for your next appointment:   In Person  Provider:   Shelva Majestic, MD   Important Information About Sugar         Signed, Shelva Majestic, MD  02/20/2022 6:03 PM    [facility] [address], [place], [place], [place]  [address] Phone: [phone]

## 2022-02-14 ENCOUNTER — Other Ambulatory Visit (HOSPITAL_COMMUNITY): Payer: Self-pay | Admitting: Psychiatry

## 2022-02-14 ENCOUNTER — Other Ambulatory Visit: Payer: Self-pay | Admitting: Neurology

## 2022-02-16 NOTE — Telephone Encounter (Signed)
Call for appt

## 2022-02-17 DIAGNOSIS — M1712 Unilateral primary osteoarthritis, left knee: Secondary | ICD-10-CM | POA: Diagnosis not present

## 2022-02-17 DIAGNOSIS — M25662 Stiffness of left knee, not elsewhere classified: Secondary | ICD-10-CM | POA: Diagnosis not present

## 2022-02-17 DIAGNOSIS — M25562 Pain in left knee: Secondary | ICD-10-CM | POA: Diagnosis not present

## 2022-02-17 NOTE — Telephone Encounter (Signed)
Called to schedule no answer left vm  

## 2022-02-19 ENCOUNTER — Encounter (HOSPITAL_COMMUNITY): Payer: Self-pay | Admitting: Psychiatry

## 2022-02-19 ENCOUNTER — Telehealth (HOSPITAL_COMMUNITY): Payer: BC Managed Care – PPO | Admitting: Psychiatry

## 2022-02-19 DIAGNOSIS — F331 Major depressive disorder, recurrent, moderate: Secondary | ICD-10-CM

## 2022-02-19 MED ORDER — METHYLPHENIDATE HCL 10 MG PO TABS: 10.0000 mg | ORAL_TABLET | Freq: Every day | ORAL | 0 refills | Status: DC

## 2022-02-19 MED ORDER — BUPROPION HCL ER (XL) 300 MG PO TB24: 300.0000 mg | ORAL_TABLET | ORAL | 2 refills | Status: DC

## 2022-02-19 MED ORDER — PRAZOSIN HCL 2 MG PO CAPS: 2.0000 mg | ORAL_CAPSULE | Freq: Every day | ORAL | 2 refills | Status: DC

## 2022-02-19 MED ORDER — CLONAZEPAM 0.5 MG PO TABS: 0.5000 mg | ORAL_TABLET | Freq: Two times a day (BID) | ORAL | 0 refills | Status: DC | PRN

## 2022-02-19 MED ORDER — METHYLPHENIDATE HCL 20 MG PO TABS: 20.0000 mg | ORAL_TABLET | Freq: Every day | ORAL | 0 refills | Status: DC

## 2022-02-19 MED ORDER — DULOXETINE HCL 60 MG PO CPEP: 120.0000 mg | ORAL_CAPSULE | Freq: Every day | ORAL | 3 refills | Status: DC

## 2022-02-19 NOTE — Progress Notes (Signed)
Virtual Visit via Video Note  I connected with Pamela Walters on 02/19/22 at  1:00 PM EDT by a video enabled telemedicine application and verified that I am speaking with the correct person using two identifiers.  Location: Patient: home Provider: office   I discussed the limitations of evaluation and management by telemedicine and the availability of in person appointments. The patient expressed understanding and agreed to proceed.    I discussed the assessment and treatment plan with the patient. The patient was provided an opportunity to ask questions and all were answered. The patient agreed with the plan and demonstrated an understanding of the instructions.   The patient was advised to call back or seek an in-person evaluation if the symptoms worsen or if the condition fails to improve as anticipated.  I provided 15 minutes of non-face-to-face time during this encounter.   [name], MD  [facility] MD/PA/NP OP Progress Note  02/19/2022 1:21 PM Pamela Walters  MRN:  [id number]  Chief Complaint:  Chief Complaint  Patient presents with   Depression   Anxiety   Follow-up   HPI: This patient is a 61 year old married white female who lives with her husband in [place].  She is retired as an Corporate treasurer.  The patient returns for follow-up after about 7 months.  Last time she was somewhat depressed but now she feels a lot better.  She is gotten through the hump of missing her work.  She is spending a lot more time with family.  She is going to have a knee replacement on her left knee in November as she is having a lot of pain and decreased mobility.  She has used the methylphenidate 10 mg about once a day and this is helped her energy and motivation.  She denies significant depression or anxiety right now.  She is using her CPAP regularly and is sleeping better without nightmares. Visit Diagnosis:    ICD-10-CM   1. Major depressive disorder, recurrent episode, moderate ([place])  F33.1        Past Psychiatric History: none  Past Medical History:  Past Medical History:  Diagnosis Date   Allergy    Anemia    Anxiety    Arthritis    Back pain    Bell's palsy 08/2017   Depression    Depression    Phreesia 05/19/2020   GERD (gastroesophageal reflux disease)    Hepatic steatosis    Hiatal hernia    History of Bell's palsy 02/11/2018   Hyperlipidemia    Hypertension    Internal hemorrhoids    MS (multiple sclerosis) ([place])    2007   Multiple sclerosis ([place])    Multiple sclerosis ([place]) 04/04/2008   Qualifier: Diagnosis of  By: Moshe Cipro MD, Margaret     Schatzki's ring    Sleep apnea    wears CPAP    Past Surgical History:  Procedure Laterality Date   ABDOMINAL HYSTERECTOMY  2005   fibroids   CARDIAC CATHETERIZATION N/A 05/04/2016   Procedure: Left Heart Cath and Coronary Angiography;  Surgeon: Jettie Booze, MD;  Location: [facility];  Service: Cardiovascular;  Laterality: N/A;   COLONOSCOPY     ESOPHAGOGASTRODUODENOSCOPY N/A 09/18/2013   Procedure: ESOPHAGOGASTRODUODENOSCOPY (EGD);  Surgeon: Jerene Bears, MD;  Location: [facility];  Service: Gastroenterology;  Laterality: N/A;   UPPER GASTROINTESTINAL ENDOSCOPY     WISDOM TOOTH EXTRACTION      Family Psychiatric History: see below  Family History:  Family  History  Problem Relation Age of Onset   Hypertension Mother    Hyperlipidemia Mother    Depression Mother    Hypertension Father    Hypertension Sister    High Cholesterol Sister    Healthy Son    Healthy Daughter    Colon cancer Neg Hx    Esophageal cancer Neg Hx    Rectal cancer Neg Hx    Stomach cancer Neg Hx    Colon polyps Neg Hx     Social History:  Social History   Socioeconomic History   Marital status: Married    Spouse name: Not on file   Number of children: Not on file   Years of education: Not on file   Highest education level: Not on file  Occupational History   Not on file  Tobacco Use   Smoking status:  Never   Smokeless tobacco: Never  Vaping Use   Vaping Use: Never used  Substance and Sexual Activity   Alcohol use: No   Drug use: No   Sexual activity: Yes    Birth control/protection: Surgical    Comment: hyst  Other Topics Concern   Not on file  Social History Narrative   Not on file   Social Determinants of Health   Financial Resource Strain: Low Risk  (06/05/2020)   Overall Financial Resource Strain (CARDIA)    Difficulty of Paying Living Expenses: Not very hard  Food Insecurity: No Food Insecurity (06/05/2020)   Hunger Vital Sign    Worried About Running Out of Food in the Last Year: Never true    Ran Out of Food in the Last Year: Never true  Transportation Needs: No Transportation Needs (06/05/2020)   PRAPARE - Hydrologist (Medical): No    Lack of Transportation (Non-Medical): No  Physical Activity: Insufficiently Active (06/05/2020)   Exercise Vital Sign    Days of Exercise per Week: 3 days    Minutes of Exercise per Session: 30 min  Stress: Stress Concern Present (06/05/2020)   [place]    Feeling of Stress : Very much  Social Connections: Moderately Isolated (06/05/2020)   Social Connection and Isolation Panel [NHANES]    Frequency of Communication with Friends and Family: Once a week    Frequency of Social Gatherings with Friends and Family: Once a week    Attends Religious Services: More than 4 times per year    Active Member of Clubs or Organizations: No    Attends Archivist Meetings: Never    Marital Status: Married    Allergies: No Known Allergies  Metabolic Disorder Labs: Lab Results  Component Value Date   HGBA1C 5.5 05/10/2017   MPG 111 05/10/2017   MPG 108 02/20/2016   No results found for: "PROLACTIN" Lab Results  Component Value Date   CHOL 153 01/14/2022   TRIG 197 (H) 01/14/2022   HDL 39 (L) 01/14/2022   CHOLHDL 3.9 01/14/2022   VLDL 34  (H) 06/30/2016   [place] 81 01/14/2022   LDLCALC 60 12/11/2020   Lab Results  Component Value Date   TSH 1.990 01/14/2022   TSH 2.480 02/06/2020    Therapeutic Level Labs: No results found for: "LITHIUM" No results found for: "VALPROATE" No results found for: "CBMZ"  Current Medications: Current Outpatient Medications  Medication Sig Dispense Refill   methylphenidate (RITALIN) 10 MG tablet Take 1 tablet (10 mg total) by mouth daily. 30 tablet  0   methylphenidate (RITALIN) 10 MG tablet Take 1 tablet (10 mg total) by mouth daily. 30 tablet 0   methylphenidate (RITALIN) 20 MG tablet Take 1 tablet (20 mg total) by mouth daily. 30 tablet 0   amLODipine (NORVASC) 5 MG tablet TAKE 1 TABLET BY MOUTH  DAILY 90 tablet 3   aspirin 81 MG tablet Take 81 mg by mouth daily.     buPROPion (WELLBUTRIN XL) 300 MG 24 hr tablet Take 1 tablet (300 mg total) by mouth every morning. 30 tablet 2   clonazePAM (KLONOPIN) 0.5 MG tablet Take 1 tablet (0.5 mg total) by mouth 2 (two) times daily as needed. for anxiety 60 tablet 0   DULoxetine (CYMBALTA) 60 MG capsule Take 2 capsules (120 mg total) by mouth daily. 180 capsule 3   ezetimibe (ZETIA) 10 MG tablet TAKE 1 TABLET BY MOUTH  DAILY 90 tablet 3   gabapentin (NEURONTIN) 600 MG tablet Take 1 tablet (600 mg total) by mouth 3 (three) times daily. 270 tablet 3   meclizine (ANTIVERT) 12.5 MG tablet Take 1 tablet (12.5 mg total) by mouth 3 (three) times daily as needed for dizziness. 30 tablet 0   metaxalone (SKELAXIN) 800 MG tablet Take one tablet by mouth two times daily for  Muscle spasm 60 tablet 5   methylphenidate (RITALIN) 10 MG tablet Take 1 tablet (10 mg total) by mouth 2 (two) times daily with breakfast and lunch. 60 tablet 0   mometasone (NASONEX) 50 MCG/ACT nasal spray USE 2 SPRAYS NASALLY DAILY 51 g 1   Multiple Vitamin (MULTIVITAMIN WITH MINERALS) TABS tablet Take 1 tablet by mouth daily.     oxybutynin (DITROPAN-XL) 10 MG 24 hr tablet Take 1 tablet  (10 mg total) by mouth at bedtime. 90 tablet 1   potassium chloride SA (KLOR-CON M) 20 MEQ tablet Take 1 tablet (20 mEq total) by mouth daily. 30 tablet 3   prazosin (MINIPRESS) 2 MG capsule Take 1 capsule (2 mg total) by mouth at bedtime. 30 capsule 2   rosuvastatin (CRESTOR) 40 MG tablet TAKE 1 TABLET BY MOUTH  DAILY 90 tablet 3   traMADol (ULTRAM) 50 MG tablet TAKE 1 TO 2 TABLETS BY  MOUTH UP TO 4 PILLS DAILY  AS NEEDED , BUT NO MORE THAN 90 TABLETS PER MONTH 270 tablet 1   zonisamide (ZONEGRAN) 100 MG capsule Take 1 capsule by mouth once daily 30 capsule 0   No current facility-administered medications for this visit.   Facility-Administered Medications Ordered in Other Visits  Medication Dose Route Frequency Provider Last Rate Last Admin   gadopentetate dimeglumine (MAGNEVIST) injection 20 mL  20 mL Intravenous Once PRN Sater, Nanine Means, MD         Musculoskeletal: Strength & Muscle Tone: within normal limits Gait & Station: normal Patient leans: N/A  Psychiatric Specialty Exam: Review of Systems  Musculoskeletal:  Positive for arthralgias and gait problem.  All other systems reviewed and are negative.   There were no vitals taken for this visit.There is no height or weight on file to calculate BMI.  General Appearance: Casual and Fairly Groomed  Eye Contact:  Good  Speech:  Clear and Coherent  Volume:  Normal  Mood:  Euthymic  Affect:  Appropriate and Congruent  Thought Process:  Goal Directed  Orientation:  Full (Time, Place, and Person)  Thought Content: WDL   Suicidal Thoughts:  No  Homicidal Thoughts:  No  Memory:  Immediate;   Good Recent;  Good Remote;   Good  Judgement:  Good  Insight:  Good  Psychomotor Activity:  Decreased  Concentration:  Concentration: Good and Attention Span: Good  Recall:  Good  Fund of Knowledge: Good  Language: Good  Akathisia:  No  Handed:  Right  AIMS (if indicated): not done  Assets:  Communication Skills Desire for  Improvement Resilience Social Support Talents/Skills  ADL's:  Intact  Cognition: WNL  Sleep:  Good   Screenings: GAD-7    Flowsheet Row Office Visit from 12/31/2021 in [facility] Office Visit from 06/05/2020 in [facility]  Total GAD-7 Score 6 14      PHQ2-9    [facility] Office Visit from 01/14/2022 in [facility] Office Visit from 12/31/2021 in [facility] Video Visit from 07/11/2021 in [facility] from 07/07/2021 in [facility] Office Visit from 06/04/2021 in [facility]  PHQ-2 Total Score 0 0 '5 4 2  '$ PHQ-9 Total Score -- -- '13 18 9      '$ Flowsheet Row Video Visit from 07/11/2021 in [facility] Video Visit from 02/19/2021 in [facility] Video Visit from 01/23/2021 in [facility] No Risk No Risk No Risk        Assessment and Plan: This patient is a 61 year old female with a history of depression anxiety chronic fatigue and fibromyalgia.  Overall she is doing much better.  She will continue Wellbutrin XL 300 mg daily and Cymbalta 60 mg twice daily for depression, prazosin 2 mg at bedtime to prevent nightmares clonazepam 0.5 mg twice daily for anxiety and methylphenidate 10 mg daily for focus and alertness.  She will return to see me in 3 months  Collaboration of Care: Collaboration of Care: Primary Care Provider AEB notes are shared with PCP through the epic system  Patient/Guardian was advised Release of Information must be obtained prior to any record release in order to collaborate their care with an outside provider. Patient/Guardian was advised if they have not already done so to contact the registration department to sign all necessary forms in order for [place] to release information  regarding their care.   Consent: Patient/Guardian gives verbal consent for treatment and assignment of benefits for services provided during this visit. Patient/Guardian expressed understanding and agreed to proceed.    [name], MD 02/19/2022, 1:21 PM

## 2022-02-20 ENCOUNTER — Encounter: Payer: Self-pay | Admitting: Cardiovascular Disease

## 2022-03-02 ENCOUNTER — Other Ambulatory Visit (HOSPITAL_COMMUNITY): Payer: Self-pay | Admitting: Psychiatry

## 2022-03-02 ENCOUNTER — Telehealth (HOSPITAL_COMMUNITY): Payer: Self-pay | Admitting: *Deleted

## 2022-03-02 MED ORDER — METHYLPHENIDATE HCL 10 MG PO TABS: 10.0000 mg | ORAL_TABLET | Freq: Every day | ORAL | 0 refills | Status: DC

## 2022-03-02 NOTE — Telephone Encounter (Signed)
Patient pharmacy called stating that they would like to know which strength of Ritalin is patient on. They received '20mg'$  and a '10mg'$ . Are they filling all three or 2 of the three script. Please resend it to them with the correct mg.   [phone]  Ref number is [id number]  Edd Arbour L

## 2022-03-02 NOTE — Telephone Encounter (Signed)
Spoke with [place] from [facility]. Informed her with what provider stated and she verbalized understanding

## 2022-03-02 NOTE — Telephone Encounter (Signed)
It should be the 10 mg, I resent it

## 2022-03-08 ENCOUNTER — Other Ambulatory Visit: Payer: Self-pay | Admitting: Neurology

## 2022-03-11 HISTORY — PX: REPLACEMENT TOTAL KNEE: SUR1224

## 2022-03-12 ENCOUNTER — Ambulatory Visit (HOSPITAL_COMMUNITY): Payer: BC Managed Care – PPO | Attending: Cardiovascular Disease

## 2022-03-12 DIAGNOSIS — R011 Cardiac murmur, unspecified: Secondary | ICD-10-CM | POA: Diagnosis not present

## 2022-03-12 DIAGNOSIS — R0602 Shortness of breath: Secondary | ICD-10-CM | POA: Diagnosis not present

## 2022-03-12 DIAGNOSIS — R9431 Abnormal electrocardiogram [ECG] [EKG]: Secondary | ICD-10-CM

## 2022-03-12 LAB — ECHOCARDIOGRAM COMPLETE
Area-P 1/2: 3.42 cm2
S' Lateral: 2.7 cm

## 2022-03-13 ENCOUNTER — Encounter (HOSPITAL_COMMUNITY): Payer: Self-pay

## 2022-03-13 ENCOUNTER — Ambulatory Visit (HOSPITAL_COMMUNITY): Payer: BC Managed Care – PPO | Admitting: Psychiatry

## 2022-03-18 ENCOUNTER — Other Ambulatory Visit: Payer: Self-pay | Admitting: Family Medicine

## 2022-03-18 DIAGNOSIS — G8918 Other acute postprocedural pain: Secondary | ICD-10-CM | POA: Diagnosis not present

## 2022-03-18 DIAGNOSIS — M1712 Unilateral primary osteoarthritis, left knee: Secondary | ICD-10-CM | POA: Diagnosis not present

## 2022-03-23 DIAGNOSIS — M1712 Unilateral primary osteoarthritis, left knee: Secondary | ICD-10-CM | POA: Diagnosis not present

## 2022-03-25 DIAGNOSIS — M1712 Unilateral primary osteoarthritis, left knee: Secondary | ICD-10-CM | POA: Diagnosis not present

## 2022-03-27 ENCOUNTER — Telehealth (HOSPITAL_COMMUNITY): Payer: Self-pay | Admitting: Psychiatry

## 2022-03-27 ENCOUNTER — Ambulatory Visit (HOSPITAL_COMMUNITY): Payer: BC Managed Care – PPO | Admitting: Psychiatry

## 2022-03-27 DIAGNOSIS — M1712 Unilateral primary osteoarthritis, left knee: Secondary | ICD-10-CM | POA: Diagnosis not present

## 2022-03-27 NOTE — Telephone Encounter (Signed)
Therapist attempted to contact patient via text through caregility platform, no response.  Therapist called patient, left message indicating attempt, and requesting patient call office.

## 2022-03-30 DIAGNOSIS — M1712 Unilateral primary osteoarthritis, left knee: Secondary | ICD-10-CM | POA: Diagnosis not present

## 2022-04-01 DIAGNOSIS — M1712 Unilateral primary osteoarthritis, left knee: Secondary | ICD-10-CM | POA: Diagnosis not present

## 2022-04-03 DIAGNOSIS — M1712 Unilateral primary osteoarthritis, left knee: Secondary | ICD-10-CM | POA: Diagnosis not present

## 2022-04-06 DIAGNOSIS — M1712 Unilateral primary osteoarthritis, left knee: Secondary | ICD-10-CM | POA: Diagnosis not present

## 2022-04-08 ENCOUNTER — Other Ambulatory Visit: Payer: Self-pay | Admitting: Neurology

## 2022-04-08 DIAGNOSIS — M1712 Unilateral primary osteoarthritis, left knee: Secondary | ICD-10-CM | POA: Diagnosis not present

## 2022-04-10 ENCOUNTER — Telehealth (HOSPITAL_COMMUNITY): Payer: Self-pay | Admitting: Psychiatry

## 2022-04-10 ENCOUNTER — Encounter (HOSPITAL_COMMUNITY): Payer: Self-pay | Admitting: Psychiatry

## 2022-04-10 ENCOUNTER — Ambulatory Visit (HOSPITAL_COMMUNITY): Payer: BC Managed Care – PPO | Admitting: Psychiatry

## 2022-04-10 DIAGNOSIS — M1712 Unilateral primary osteoarthritis, left knee: Secondary | ICD-10-CM | POA: Diagnosis not present

## 2022-04-10 NOTE — Telephone Encounter (Signed)
Therapist attempted to contact patient via text through caregility platform for scheduled appointment, no response.  Therapist called patient, left message indicating attempt, and requesting patient call office. 

## 2022-04-13 DIAGNOSIS — M1712 Unilateral primary osteoarthritis, left knee: Secondary | ICD-10-CM | POA: Diagnosis not present

## 2022-04-21 DIAGNOSIS — M1712 Unilateral primary osteoarthritis, left knee: Secondary | ICD-10-CM | POA: Diagnosis not present

## 2022-05-18 ENCOUNTER — Other Ambulatory Visit (HOSPITAL_COMMUNITY): Payer: Self-pay | Admitting: Psychiatry

## 2022-05-26 ENCOUNTER — Encounter (HOSPITAL_COMMUNITY): Payer: Self-pay | Admitting: Psychiatry

## 2022-05-26 ENCOUNTER — Telehealth (INDEPENDENT_AMBULATORY_CARE_PROVIDER_SITE_OTHER): Payer: BC Managed Care – PPO | Admitting: Psychiatry

## 2022-05-26 DIAGNOSIS — F331 Major depressive disorder, recurrent, moderate: Secondary | ICD-10-CM

## 2022-05-26 MED ORDER — BUPROPION HCL ER (XL) 300 MG PO TB24: 300.0000 mg | ORAL_TABLET | ORAL | 2 refills | Status: DC

## 2022-05-26 MED ORDER — PRAZOSIN HCL 2 MG PO CAPS: 2.0000 mg | ORAL_CAPSULE | Freq: Every day | ORAL | 3 refills | Status: DC

## 2022-05-26 MED ORDER — METHYLPHENIDATE HCL 10 MG PO TABS: 10.0000 mg | ORAL_TABLET | Freq: Two times a day (BID) | ORAL | 0 refills | Status: DC

## 2022-05-26 MED ORDER — DULOXETINE HCL 60 MG PO CPEP: 120.0000 mg | ORAL_CAPSULE | Freq: Every day | ORAL | 3 refills | Status: DC

## 2022-05-26 NOTE — Progress Notes (Signed)
Virtual Visit via Video Note  I connected with Pamela Walters on 05/26/22 at  1:00 PM EST by a video enabled telemedicine application and verified that I am speaking with the correct person using two identifiers.  Location: Patient: home Provider: office   I discussed the limitations of evaluation and management by telemedicine and the availability of in person appointments. The patient expressed understanding and agreed to proceed.     I discussed the assessment and treatment plan with the patient. The patient was provided an opportunity to ask questions and all were answered. The patient agreed with the plan and demonstrated an understanding of the instructions.   The patient was advised to call back or seek an in-person evaluation if the symptoms worsen or if the condition fails to improve as anticipated.  I provided 15 minutes of non-face-to-face time during this encounter.   [name], MD  [facility] MD/PA/NP OP Progress Note  05/26/2022 1:15 PM Pamela Walters  MRN:  [id number]  Chief Complaint:  Chief Complaint  Patient presents with   Depression   Follow-up   HPI: This patient is a 62 year old married white female who lives with her husband in [place].  She is retired as an Corporate treasurer.  The patient returns for follow-up after about 3 months.  Since I last saw her she had left knee replacement in November.  She has gone through physical therapy and now is continuing to work on her exercises at home.  She is gradually making good progress in gaining mobility.  She still having some pain but is not as bad as before.  Her mood has generally been good and she denies significant depression anxiety thoughts of self-harm or difficulty sleeping.  She is no longer having nightmares. Visit Diagnosis:    ICD-10-CM   1. Major depressive disorder, recurrent episode, moderate ([place])  F33.1       Past Psychiatric History: none  Past Medical History:  Past Medical History:  Diagnosis Date    Allergy    Anemia    Anxiety    Arthritis    Back pain    Bell's palsy 08/2017   Depression    Depression    Phreesia 05/19/2020   GERD (gastroesophageal reflux disease)    Hepatic steatosis    Hiatal hernia    History of Bell's palsy 02/11/2018   Hyperlipidemia    Hypertension    Internal hemorrhoids    MS (multiple sclerosis) ([place])    2007   Multiple sclerosis ([place])    Multiple sclerosis ([place]) 04/04/2008   Qualifier: Diagnosis of  By: Moshe Cipro MD, Margaret     Schatzki's ring    Sleep apnea    wears CPAP    Past Surgical History:  Procedure Laterality Date   ABDOMINAL HYSTERECTOMY  2005   fibroids   CARDIAC CATHETERIZATION N/A 05/04/2016   Procedure: Left Heart Cath and Coronary Angiography;  Surgeon: Jettie Booze, MD;  Location: [place] [facility];  Service: Cardiovascular;  Laterality: N/A;   COLONOSCOPY     ESOPHAGOGASTRODUODENOSCOPY N/A 09/18/2013   Procedure: ESOPHAGOGASTRODUODENOSCOPY (EGD);  Surgeon: Jerene Bears, MD;  Location: [place];  Service: Gastroenterology;  Laterality: N/A;   UPPER GASTROINTESTINAL ENDOSCOPY     WISDOM TOOTH EXTRACTION      Family Psychiatric History: See below  Family History:  Family History  Problem Relation Age of Onset   Hypertension Mother    Hyperlipidemia Mother    Depression Mother    Hypertension  Father    Hypertension Sister    High Cholesterol Sister    Healthy Son    Healthy Daughter    Colon cancer Neg Hx    Esophageal cancer Neg Hx    Rectal cancer Neg Hx    Stomach cancer Neg Hx    Colon polyps Neg Hx     Social History:  Social History   Socioeconomic History   Marital status: Married    Spouse name: Not on file   Number of children: Not on file   Years of education: Not on file   Highest education level: Not on file  Occupational History   Not on file  Tobacco Use   Smoking status: Never   Smokeless tobacco: Never  Vaping Use   Vaping Use: Never used  Substance and Sexual Activity    Alcohol use: No   Drug use: No   Sexual activity: Yes    Birth control/protection: Surgical    Comment: hyst  Other Topics Concern   Not on file  Social History Narrative   Not on file   Social Determinants of Health   Financial Resource Strain: Low Risk  (06/05/2020)   Overall Financial Resource Strain (CARDIA)    Difficulty of Paying Living Expenses: Not very hard  Food Insecurity: No Food Insecurity (06/05/2020)   Hunger Vital Sign    Worried About Running Out of Food in the Last Year: Never true    Ran Out of Food in the Last Year: Never true  Transportation Needs: No Transportation Needs (06/05/2020)   PRAPARE - Hydrologist (Medical): No    Lack of Transportation (Non-Medical): No  Physical Activity: Insufficiently Active (06/05/2020)   Exercise Vital Sign    Days of Exercise per Week: 3 days    Minutes of Exercise per Session: 30 min  Stress: Stress Concern Present (06/05/2020)   [place]    Feeling of Stress : Very much  Social Connections: Moderately Isolated (06/05/2020)   Social Connection and Isolation Panel [NHANES]    Frequency of Communication with Friends and Family: Once a week    Frequency of Social Gatherings with Friends and Family: Once a week    Attends Religious Services: More than 4 times per year    Active Member of Clubs or Organizations: No    Attends Archivist Meetings: Never    Marital Status: Married    Allergies: No Known Allergies  Metabolic Disorder Labs: Lab Results  Component Value Date   HGBA1C 5.5 05/10/2017   MPG 111 05/10/2017   MPG 108 02/20/2016   No results found for: "PROLACTIN" Lab Results  Component Value Date   CHOL 153 01/14/2022   TRIG 197 (H) 01/14/2022   HDL 39 (L) 01/14/2022   CHOLHDL 3.9 01/14/2022   VLDL 34 (H) 06/30/2016   [place] 81 01/14/2022   LDLCALC 60 12/11/2020   Lab Results  Component Value Date   TSH  1.990 01/14/2022   TSH 2.480 02/06/2020    Therapeutic Level Labs: No results found for: "LITHIUM" No results found for: "VALPROATE" No results found for: "CBMZ"  Current Medications: Current Outpatient Medications  Medication Sig Dispense Refill   methylphenidate (RITALIN) 10 MG tablet Take 1 tablet (10 mg total) by mouth 2 (two) times daily with breakfast and lunch. 60 tablet 0   amLODipine (NORVASC) 5 MG tablet TAKE 1 TABLET BY MOUTH DAILY 90 tablet 3  aspirin 81 MG tablet Take 81 mg by mouth daily.     buPROPion (WELLBUTRIN XL) 300 MG 24 hr tablet Take 1 tablet (300 mg total) by mouth every morning. 30 tablet 2   clonazePAM (KLONOPIN) 0.5 MG tablet Take 1 tablet (0.5 mg total) by mouth 2 (two) times daily as needed. for anxiety 60 tablet 0   DULoxetine (CYMBALTA) 60 MG capsule Take 2 capsules (120 mg total) by mouth daily. 180 capsule 3   ezetimibe (ZETIA) 10 MG tablet TAKE 1 TABLET BY MOUTH  DAILY 90 tablet 3   gabapentin (NEURONTIN) 600 MG tablet Take 1 tablet (600 mg total) by mouth 3 (three) times daily. 270 tablet 3   meclizine (ANTIVERT) 12.5 MG tablet Take 1 tablet (12.5 mg total) by mouth 3 (three) times daily as needed for dizziness. 30 tablet 0   metaxalone (SKELAXIN) 800 MG tablet Take one tablet by mouth two times daily for  Muscle spasm 60 tablet 5   methylphenidate (RITALIN) 10 MG tablet Take 1 tablet (10 mg total) by mouth daily. 30 tablet 0   methylphenidate (RITALIN) 10 MG tablet Take 1 tablet (10 mg total) by mouth 2 (two) times daily with breakfast and lunch. 60 tablet 0   methylphenidate (RITALIN) 10 MG tablet Take 1 tablet (10 mg total) by mouth 2 (two) times daily with breakfast and lunch. 60 tablet 0   mometasone (NASONEX) 50 MCG/ACT nasal spray USE 2 SPRAYS NASALLY DAILY 51 g 1   Multiple Vitamin (MULTIVITAMIN WITH MINERALS) TABS tablet Take 1 tablet by mouth daily.     oxybutynin (DITROPAN-XL) 10 MG 24 hr tablet Take 1 tablet (10 mg total) by mouth at bedtime.  90 tablet 1   potassium chloride SA (KLOR-CON M) 20 MEQ tablet Take 1 tablet (20 mEq total) by mouth daily. 30 tablet 3   prazosin (MINIPRESS) 2 MG capsule Take 1 capsule (2 mg total) by mouth at bedtime. 90 capsule 3   rosuvastatin (CRESTOR) 40 MG tablet TAKE 1 TABLET BY MOUTH  DAILY 90 tablet 3   traMADol (ULTRAM) 50 MG tablet TAKE 1 TO 2 TABLETS BY  MOUTH UP TO 4 PILLS DAILY  AS NEEDED , BUT NO MORE THAN 90 TABLETS PER MONTH 270 tablet 1   zonisamide (ZONEGRAN) 100 MG capsule Take 1 capsule by mouth once daily 15 capsule 0   No current facility-administered medications for this visit.   Facility-Administered Medications Ordered in Other Visits  Medication Dose Route Frequency Provider Last Rate Last Admin   gadopentetate dimeglumine (MAGNEVIST) injection 20 mL  20 mL Intravenous Once PRN Sater, Nanine Means, MD         Musculoskeletal: Strength & Muscle Tone: within normal limits Gait & Station: normal Patient leans: N/A  Psychiatric Specialty Exam: Review of Systems  Musculoskeletal:  Positive for arthralgias, gait problem and joint swelling.  All other systems reviewed and are negative.   There were no vitals taken for this visit.There is no height or weight on file to calculate BMI.  General Appearance: Casual and Fairly Groomed  Eye Contact:  Good  Speech:  Clear and Coherent  Volume:  Normal  Mood:  Euthymic  Affect:  Congruent  Thought Process:  Goal Directed  Orientation:  Full (Time, Place, and Person)  Thought Content: WDL   Suicidal Thoughts:  No  Homicidal Thoughts:  No  Memory:  Immediate;   Good Recent;   Good Remote;   Good  Judgement:  Good  Insight:  Good  Psychomotor Activity:  Decreased  Concentration:  Concentration: Good and Attention Span: Good  Recall:  Good  Fund of Knowledge: Good  Language: Good  Akathisia:  No  Handed:  Right  AIMS (if indicated): not done  Assets:  Communication Skills Desire for Improvement Resilience Social  Support Talents/Skills  ADL's:  Intact  Cognition: WNL  Sleep:  Good   Screenings: GAD-7    Flowsheet Row Office Visit from 12/31/2021 in [facility] Office Visit from 06/05/2020 in [facility] [place] at [facility]  Total GAD-7 Score 6 14      PHQ2-9    [place] Office Visit from 01/14/2022 in [facility] Office Visit from 12/31/2021 in [facility] Video Visit from 07/11/2021 in [place] at [place] from 07/07/2021 in [place] at [place] Visit from 06/04/2021 in [facility]  PHQ-2 Total Score 0 0 '5 4 2  '$ PHQ-9 Total Score -- -- '13 18 9      '$ Flowsheet Row Video Visit from 07/11/2021 in [place] at [place] Video Visit from 02/19/2021 in [place] at [place] Video Visit from 01/23/2021 in [place] at [place] No Risk No Risk No Risk        Assessment and Plan: This patient is a 62 year old female with a history depression anxiety chronic fatigue and fibromyalgia.  She continues to do well on her current regimen.  She will continue Wellbutrin XL 300 mg daily as well as Cymbalta 60 mg twice daily for depression, prazosin 2 mg at bedtime to prevent nightmares, clonazepam 0.5 mg twice daily for anxiety and methylphenidate 10 mg twice daily for focus and alertness.  She will return to see me in 3 months  Collaboration of Care: Collaboration of Care: Primary Care Provider AEB notes are shared with PCP through the epic system  Patient/Guardian was advised Release of Information must be obtained prior to any record release in order to collaborate their care with an outside provider. Patient/Guardian was advised if they have not already done so to contact the registration  department to sign all necessary forms in order for [place] to release information regarding their care.   Consent: Patient/Guardian gives verbal consent for treatment and assignment of benefits for services provided during this visit. Patient/Guardian expressed understanding and agreed to proceed.    [name], MD 05/26/2022, 1:15 PM

## 2022-06-13 ENCOUNTER — Other Ambulatory Visit (HOSPITAL_COMMUNITY): Payer: Self-pay | Admitting: Psychiatry

## 2022-06-21 ENCOUNTER — Other Ambulatory Visit: Payer: Self-pay | Admitting: Family Medicine

## 2022-06-25 DIAGNOSIS — H4720 Unspecified optic atrophy: Secondary | ICD-10-CM | POA: Diagnosis not present

## 2022-06-26 ENCOUNTER — Encounter: Payer: Self-pay | Admitting: *Deleted

## 2022-07-01 ENCOUNTER — Encounter: Payer: Self-pay | Admitting: Family Medicine

## 2022-07-01 ENCOUNTER — Encounter: Payer: BC Managed Care – PPO | Admitting: Family Medicine

## 2022-07-16 ENCOUNTER — Encounter: Payer: Self-pay | Admitting: Family Medicine

## 2022-07-16 ENCOUNTER — Ambulatory Visit: Payer: BC Managed Care – PPO | Admitting: Family Medicine

## 2022-07-16 VITALS — BP 144/84 | HR 91 | Ht 63.0 in | Wt 170.1 lb

## 2022-07-16 DIAGNOSIS — I10 Essential (primary) hypertension: Secondary | ICD-10-CM | POA: Diagnosis not present

## 2022-07-16 DIAGNOSIS — F418 Other specified anxiety disorders: Secondary | ICD-10-CM

## 2022-07-16 DIAGNOSIS — L918 Other hypertrophic disorders of the skin: Secondary | ICD-10-CM

## 2022-07-16 DIAGNOSIS — G35 Multiple sclerosis: Secondary | ICD-10-CM | POA: Diagnosis not present

## 2022-07-16 DIAGNOSIS — E785 Hyperlipidemia, unspecified: Secondary | ICD-10-CM

## 2022-07-16 DIAGNOSIS — E559 Vitamin D deficiency, unspecified: Secondary | ICD-10-CM | POA: Diagnosis not present

## 2022-07-16 DIAGNOSIS — E663 Overweight: Secondary | ICD-10-CM

## 2022-07-16 MED ORDER — AMLODIPINE BESYLATE 2.5 MG PO TABS: 2.5000 mg | ORAL_TABLET | Freq: Every day | ORAL | 1 refills | Status: DC

## 2022-07-16 NOTE — Patient Instructions (Addendum)
F/U in 6 to 8 weeks re evaluate blood pressure.  Additional amlodipine 2.5 mg tab is added , so total of 7.5 mg amlodipine daily, sent to your mail order  You will be referred to Dermatology and Neurology.  Gabapentin will be refilled  Labs today, CBC, lipid, cmp and EGFr, TSH and vitD  Thanks for choosing [facility], we consider it a privelige to serve you.

## 2022-07-17 LAB — CMP14+EGFR
ALT: 12 IU/L (ref 0–32)
AST: 19 IU/L (ref 0–40)
Albumin/Globulin Ratio: 1.7 (ref 1.2–2.2)
Albumin: 4.3 g/dL (ref 3.9–4.9)
Alkaline Phosphatase: 120 IU/L (ref 44–121)
BUN/Creatinine Ratio: 9 — ABNORMAL LOW (ref 12–28)
BUN: 9 mg/dL (ref 8–27)
Bilirubin Total: 0.4 mg/dL (ref 0.0–1.2)
CO2: 23 mmol/L (ref 20–29)
Calcium: 9.1 mg/dL (ref 8.7–10.3)
Chloride: 102 mmol/L (ref 96–106)
Creatinine, Ser: 0.96 mg/dL (ref 0.57–1.00)
Globulin, Total: 2.5 g/dL (ref 1.5–4.5)
Glucose: 91 mg/dL (ref 70–99)
Potassium: 3.5 mmol/L (ref 3.5–5.2)
Sodium: 142 mmol/L (ref 134–144)
Total Protein: 6.8 g/dL (ref 6.0–8.5)
eGFR: 66 mL/min/{1.73_m2} (ref >59)

## 2022-07-17 LAB — CBC
Hematocrit: 36.2 % (ref 34.0–46.6)
Hemoglobin: 11.8 g/dL (ref 11.1–15.9)
MCH: 25.1 pg — ABNORMAL LOW (ref 26.6–33.0)
MCHC: 32.6 g/dL (ref 31.5–35.7)
MCV: 77 fL — ABNORMAL LOW (ref 79–97)
Platelets: 321 10*3/uL (ref 150–450)
RBC: 4.71 x10E6/uL (ref 3.77–5.28)
RDW: 16.8 % — ABNORMAL HIGH (ref 11.7–15.4)
WBC: 9 10*3/uL (ref 3.4–10.8)

## 2022-07-17 LAB — LIPID PANEL
Chol/HDL Ratio: 3.7 ratio (ref 0.0–4.4)
Cholesterol, Total: 146 mg/dL (ref 100–199)
HDL: 39 mg/dL — ABNORMAL LOW (ref >39)
LDL Chol Calc (NIH): 79 mg/dL (ref 0–99)
Triglycerides: 159 mg/dL — ABNORMAL HIGH (ref 0–149)
VLDL Cholesterol Cal: 28 mg/dL (ref 5–40)

## 2022-07-17 LAB — VITAMIN D 25 HYDROXY (VIT D DEFICIENCY, FRACTURES): Vit D, 25-Hydroxy: 20.2 ng/mL — ABNORMAL LOW (ref 30.0–100.0)

## 2022-07-17 LAB — TSH: TSH: 3.21 u[IU]/mL (ref 0.450–4.500)

## 2022-07-18 ENCOUNTER — Encounter: Payer: Self-pay | Admitting: Family Medicine

## 2022-07-18 DIAGNOSIS — L918 Other hypertrophic disorders of the skin: Secondary | ICD-10-CM | POA: Insufficient documentation

## 2022-07-18 MED ORDER — GABAPENTIN 600 MG PO TABS: 600.0000 mg | ORAL_TABLET | Freq: Three times a day (TID) | ORAL | 0 refills | Status: DC

## 2022-07-18 NOTE — Assessment & Plan Note (Signed)
Managed by Psych med change

## 2022-07-18 NOTE — Progress Notes (Signed)
Pamela Walters     MRN: [id number]      DOB: Jan 21, 1961   HPI Pamela Walters is here for follow up and re-evaluation of chronic medical conditions, medication management and review of any available recent lab and radiology data.  Preventive health is updated, specifically  Cancer screening and Immunization.   Questions or concerns regarding consultations or procedures which the PT has had in the interim are  addressed. The PT denies any adverse reactions to current medications since the last visit.  There are no new concerns.  There are no specific complaints   ROS Denies recent fever or chills. Denies sinus pressure, nasal congestion, ear pain or sore throat. Denies chest congestion, productive cough or wheezing. Denies chest pains, palpitations and leg swelling Denies abdominal pain, nausea, vomiting,diarrhea or constipation.   Denies dysuria, frequency, hesitancy or incontinence. Denies joint pain, swelling and limitation in mobility. Denies headaches, seizures, numbness, or tingling. Denies depression, anxiety or insomnia. Denies skin break down or rash.   PE  BP (!) 144/84   Pulse 91   Ht 5\' 3"  (1.6 m)   Wt 170 lb 1.3 oz (77.1 kg)   SpO2 96%   BMI 30.13 kg/m   Patient alert and oriented and in no cardiopulmonary distress.  HEENT: No facial asymmetry, EOMI,     Neck supple .  Chest: Clear to auscultation bilaterally.  CVS: S1, S2 no murmurs, no S3.Regular rate.  ABD: Soft non tender.   Ext: No edema  MS: Adequate ROM spine, shoulders, hips and knees.  Skin: Intact, no ulcerations or rash noted.  Psych: Good eye contact, normal affect. Memory intact not anxious or depressed appearing.  CNS: CN 2-12 intact, power,  normal throughout.no focal deficits noted.   Assessment & Plan  History of multiple sclerosis Refer Neurology re eval neurologic disease  Hyperlipidemia LDL goal <100 Hyperlipidemia:Low fat diet discussed and encouraged.needs to add  tricor   Lipid Panel  Lab Results  Component Value Date   CHOL 146 07/16/2022   HDL 39 (L) 07/16/2022   LDLCALC 79 07/16/2022   TRIG 159 (H) 07/16/2022   CHOLHDL 3.7 07/16/2022      Needs to reduce fay in diet uncontrolled, med adjust after visit when lab available  Essential hypertension DASH diet and commitment to daily physical activity for a minimum of 30 minutes discussed and encouraged, as a part of hypertension management. The importance of attaining a healthy weight is also discussed.     07/16/2022   11:36 AM 07/16/2022   10:58 AM 07/16/2022   10:53 AM 02/13/2022    4:25 PM 01/14/2022   11:49 AM 12/31/2021   11:20 AM 06/04/2021    1:19 PM  BP/Weight  Systolic BP 123456 [id number] 123456 [id number]   Diastolic BP 84 80 83 78 73 80   Wt. (Lbs)   170.08 180.4 180.08 184 171  BMI   30.13 kg/m2 31.96 kg/m2 31.9 kg/m2 32.59 kg/m2 30.29 kg/m2      Add amlodipine 2.5 mg  Cutaneous skin tags Enlarging esp at neck requests removal refer derm  Vitamin D deficiency Needs to supplement with oTC vit D  Overweight (BMI 25.0-29.9)  Patient re-educated about  the importance of commitment to a  minimum of 150 minutes of exercise per week as able.  The importance of healthy food choices with portion control discussed, as well as eating regularly and within a 12 hour window most days. The need to choose "clean ,  green" food 50 to 75% of the time is discussed, as well as to make water the primary drink and set a goal of 64 ounces water daily.       07/16/2022   10:53 AM 02/13/2022    4:25 PM 01/14/2022   11:49 AM  Weight /BMI  Weight 170 lb 1.3 oz 180 lb 6.4 oz 180 lb 1.3 oz  Height 5\' 3"  (1.6 m) 5\' 3"  (1.6 m) 5\' 3"  (1.6 m)  BMI 30.13 kg/m2 31.96 kg/m2 31.9 kg/m2      Depression with anxiety Managed by Psych med change

## 2022-07-18 NOTE — Assessment & Plan Note (Signed)
DASH diet and commitment to daily physical activity for a minimum of 30 minutes discussed and encouraged, as a part of hypertension management. The importance of attaining a healthy weight is also discussed.     07/16/2022   11:36 AM 07/16/2022   10:58 AM 07/16/2022   10:53 AM 02/13/2022    4:25 PM 01/14/2022   11:49 AM 12/31/2021   11:20 AM 06/04/2021    1:19 PM  BP/Weight  Systolic BP 123456 [id number] 123456 [id number]   Diastolic BP 84 80 83 78 73 80   Wt. (Lbs)   170.08 180.4 180.08 184 171  BMI   30.13 kg/m2 31.96 kg/m2 31.9 kg/m2 32.59 kg/m2 30.29 kg/m2      Add amlodipine 2.5 mg

## 2022-07-18 NOTE — Assessment & Plan Note (Signed)
Refer Neurology re eval neurologic disease

## 2022-07-18 NOTE — Assessment & Plan Note (Signed)
Enlarging esp at neck requests removal refer derm

## 2022-07-18 NOTE — Assessment & Plan Note (Signed)
  Patient re-educated about  the importance of commitment to a  minimum of 150 minutes of exercise per week as able.  The importance of healthy food choices with portion control discussed, as well as eating regularly and within a 12 hour window most days. The need to choose "clean , green" food 50 to 75% of the time is discussed, as well as to make water the primary drink and set a goal of 64 ounces water daily.       07/16/2022   10:53 AM 02/13/2022    4:25 PM 01/14/2022   11:49 AM  Weight /BMI  Weight 170 lb 1.3 oz 180 lb 6.4 oz 180 lb 1.3 oz  Height 5\' 3"  (1.6 m) 5\' 3"  (1.6 m) 5\' 3"  (1.6 m)  BMI 30.13 kg/m2 31.96 kg/m2 31.9 kg/m2

## 2022-07-18 NOTE — Assessment & Plan Note (Addendum)
Hyperlipidemia:Low fat diet discussed and encouraged.needs to add tricor   Lipid Panel  Lab Results  Component Value Date   CHOL 146 07/16/2022   HDL 39 (L) 07/16/2022   LDLCALC 79 07/16/2022   TRIG 159 (H) 07/16/2022   CHOLHDL 3.7 07/16/2022      Needs to reduce fay in diet uncontrolled, med adjust after visit when lab available

## 2022-07-18 NOTE — Assessment & Plan Note (Signed)
Needs to supplement with oTC vit D

## 2022-07-19 ENCOUNTER — Encounter: Payer: Self-pay | Admitting: Family Medicine

## 2022-07-21 ENCOUNTER — Other Ambulatory Visit: Payer: Self-pay

## 2022-07-21 MED ORDER — ACYCLOVIR 400 MG PO TABS: 400.0000 mg | ORAL_TABLET | Freq: Three times a day (TID) | ORAL | 0 refills | Status: DC

## 2022-07-28 ENCOUNTER — Ambulatory Visit: Payer: BC Managed Care – PPO | Admitting: Internal Medicine

## 2022-07-28 ENCOUNTER — Encounter: Payer: Self-pay | Admitting: Internal Medicine

## 2022-07-28 VITALS — BP 124/68 | HR 84 | Ht 62.0 in | Wt 168.5 lb

## 2022-07-28 DIAGNOSIS — R1319 Other dysphagia: Secondary | ICD-10-CM | POA: Diagnosis not present

## 2022-07-28 DIAGNOSIS — K222 Esophageal obstruction: Secondary | ICD-10-CM

## 2022-07-28 DIAGNOSIS — K5904 Chronic idiopathic constipation: Secondary | ICD-10-CM

## 2022-07-28 DIAGNOSIS — K219 Gastro-esophageal reflux disease without esophagitis: Secondary | ICD-10-CM | POA: Diagnosis not present

## 2022-07-28 MED ORDER — TRULANCE 3 MG PO TABS: 3.0000 mg | ORAL_TABLET | Freq: Every day | ORAL | 3 refills | Status: AC

## 2022-07-28 MED ORDER — PANTOPRAZOLE SODIUM 40 MG PO TBEC: 40.0000 mg | DELAYED_RELEASE_TABLET | Freq: Two times a day (BID) | ORAL | 3 refills | Status: DC

## 2022-07-28 NOTE — Patient Instructions (Addendum)
_______________________________________________________  If your blood pressure at your visit was 140/90 or greater, please contact your primary care physician to follow up on this.  If you are age 64 or younger, your body mass index should be between 19-25. Your Body mass index is 30.82 kg/m. If this is out of the aformentioned range listed, please consider follow up with your Primary Care Provider.  ________________________________________________________  The [facility] providers would like to encourage you to use [facility] to communicate with providers for non-urgent requests or questions.  Due to long hold times on the telephone, sending your provider a message by [facility] may be a faster and more efficient way to get a response.  Please allow 48 business hours for a response.  Please remember that this is for non-urgent requests.  _______________________________________________________  DISCONTINUE: over-the-counter omeprazole.  We have sent the following medications to your pharmacy for you to pick up at your convenience:  START: pantoprazole 40mg  one tablet twice daily before meals.  START: Trulance 3mg  once daily.  Thank you for entrusting me with your care and choosing [facility].  Dr Hilarie Fredrickson

## 2022-07-28 NOTE — Progress Notes (Signed)
   Subjective:    Patient ID: Pamela Walters, female    DOB: [birth year], 62 y.o.   MRN: [id number]  HPI Pamela Walters is a 62 year old female with a history of GERD, dysphagia and esophageal stricture, remote food impaction complicated by esophageal perforation treated conservatively, adenomatous and SSP colon polyps, who is here for follow-up.  She also has a history of vitamin D deficiency, osteoarthritis with recent left TKA, sleep apnea hyperlipidemia, hypertension.  She is here alone today.  She was last here for upper endoscopy in 6 of 2021.  She reports that she is having issues with heartburn reflux and throat clearing on a near daily basis.  At times it has been significant enough to give her nausea without vomiting.  She has been using Prilosec 20 mg over-the-counter twice daily but despite this is still having the reflux.  She is having solid food dysphagia and avoiding eating meats.  She is keeping a soft diet.  She also noticed significant constipation having a bowel movement every 4 to 5 days.  Tried probiotic but did not help very much.  Has not really used over-the-counter laxatives.  No blood in stool or melena.   Review of Systems As per HPI, otherwise negative  Current Medications, Allergies, Past Medical History, Past Surgical History, Family History and Social History were reviewed in [employer] record.    Objective:   Physical Exam BP 124/68 (BP Location: Left Arm, Patient Position: Sitting, Cuff Size: Normal)   Pulse 84   Ht 5\' 2"  (1.575 m)   Wt 168 lb 8 oz (76.4 kg)   BMI 30.82 kg/m  Gen: awake, alert, NAD HEENT: anicteric  CV: RRR, no mrg Pulm: CTA b/l Abd: soft, NT/ND, +BS throughout Ext: no c/c/e Neuro: nonfocal     Assessment & Plan:  62 year old female with a history of GERD, dysphagia and esophageal stricture, remote food impaction complicated by esophageal perforation treated conservatively, adenomatous and SSP colon polyps,  who is here for follow-up  GERD with dysphagia and esophageal stricture --symptoms despite lower dose omeprazole twice daily.  Known esophageal stricture responsive to dilation. -- Discontinue omeprazole in favor of pantoprazole 40 mg twice daily AC.  Will continue twice daily pantoprazole until upper endoscopy and if symptoms improve try to reduce to once daily -- EGD in the [place] with dilation.  We reviewed the risk, benefits and alternatives and she is agreeable and wishes to proceed  2.  Chronic idiopathic constipation --trial of Trulance 3 mg daily  3.  History of adenomatous and sessile serrated colon polyps --surveillance colonoscopy recommended in April 2026  30 minutes total spent today including patient facing time, coordination of care, reviewing medical history/procedures/pertinent radiology studies, and documentation of the encounter.

## 2022-08-06 ENCOUNTER — Other Ambulatory Visit (HOSPITAL_COMMUNITY): Payer: Self-pay

## 2022-08-06 ENCOUNTER — Ambulatory Visit (INDEPENDENT_AMBULATORY_CARE_PROVIDER_SITE_OTHER): Payer: BC Managed Care – PPO | Admitting: Family Medicine

## 2022-08-06 ENCOUNTER — Telehealth: Payer: Self-pay | Admitting: Pharmacy Technician

## 2022-08-06 ENCOUNTER — Encounter: Payer: Self-pay | Admitting: Family Medicine

## 2022-08-06 VITALS — BP 130/74 | HR 72 | Ht 62.0 in | Wt 169.1 lb

## 2022-08-06 DIAGNOSIS — I10 Essential (primary) hypertension: Secondary | ICD-10-CM

## 2022-08-06 DIAGNOSIS — Z0000 Encounter for general adult medical examination without abnormal findings: Secondary | ICD-10-CM | POA: Diagnosis not present

## 2022-08-06 DIAGNOSIS — E785 Hyperlipidemia, unspecified: Secondary | ICD-10-CM | POA: Diagnosis not present

## 2022-08-06 DIAGNOSIS — Z1231 Encounter for screening mammogram for malignant neoplasm of breast: Secondary | ICD-10-CM

## 2022-08-06 DIAGNOSIS — N39498 Other specified urinary incontinence: Secondary | ICD-10-CM

## 2022-08-06 MED ORDER — ROSUVASTATIN CALCIUM 40 MG PO TABS: 40.0000 mg | ORAL_TABLET | Freq: Every day | ORAL | 3 refills | Status: DC

## 2022-08-06 MED ORDER — FENOFIBRATE 48 MG PO TABS: 48.0000 mg | ORAL_TABLET | Freq: Every day | ORAL | 1 refills | Status: DC

## 2022-08-06 MED ORDER — VITAMIN D (ERGOCALCIFEROL) 1.25 MG (50000 UNIT) PO CAPS: 50000.0000 [IU] | ORAL_CAPSULE | ORAL | 8 refills | Status: DC

## 2022-08-06 MED ORDER — EZETIMIBE 10 MG PO TABS: 10.0000 mg | ORAL_TABLET | Freq: Every day | ORAL | 3 refills | Status: DC

## 2022-08-06 MED ORDER — METAXALONE 800 MG PO TABS: ORAL_TABLET | ORAL | 1 refills | Status: DC

## 2022-08-06 NOTE — Telephone Encounter (Signed)
Patient Advocate Encounter  Received notification from [employer] that prior authorization for TRULANCE 3MG  is required.   PA submitted on 4.11.24 Key [id number] Status is pending

## 2022-08-06 NOTE — Patient Instructions (Addendum)
F/u end August , call if you need me before  Please schedule mammogram at checkout ( nurse to orde)  We will request your old medication for incontinence as you have noticed significant dry mouth since starting the oxybutynin, we will message you on that  Weekly vit D is prescribed  New additional fenofibrate is prescribed for cholesterol, contiue crestor and ezetemide and follow low fat diet  Fasting lipid, cmp and EGFr 3 to 5 days before August appointment  Pls stop at dermatology office or call for your appt info, let me know if still a problem  Handicap sticker today for 6 months It is important that you exercise regularly at least 30 minutes 5 times a week. If you develop chest pain, have severe difficulty breathing, or feel very tired, stop exercising immediately and seek medical attention   Thanks for choosing [facility], we consider it a privelige to serve you.

## 2022-08-10 ENCOUNTER — Encounter: Payer: Self-pay | Admitting: Family Medicine

## 2022-08-10 DIAGNOSIS — Z0000 Encounter for general adult medical examination without abnormal findings: Secondary | ICD-10-CM | POA: Insufficient documentation

## 2022-08-10 MED ORDER — MIRABEGRON ER 25 MG PO TB24: 25.0000 mg | ORAL_TABLET | Freq: Every day | ORAL | 3 refills | Status: DC

## 2022-08-10 NOTE — Progress Notes (Signed)
    Pamela Walters     MRN: [id number]      DOB: 12/17/1960  HPI: Patient is in for annual physical exam. Dry mouth on oxybutynin so will change to myerbetriq . Recent labs,  are reviewed.Fenofibrate added for improved lipid profile, also weekly vit D started Immunization is reviewed , and  updated if needed.   PE: BP 130/74 (BP Location: Right Arm, Patient Position: Sitting, Cuff Size: Large)   Pulse 72   Ht 5\' 2"  (1.575 m)   Wt 169 lb 1.9 oz (76.7 kg)   SpO2 94%   BMI 30.93 kg/m   Pleasant  female, alert and oriented x 3, in no cardio-pulmonary distress. Afebrile. HEENT No facial trauma or asymetry. Sinuses non tender.  Extra occullar muscles intact.. External ears normal, . Neck: supple, no adenopathy,JVD or thyromegaly.No bruits.  Chest: Clear to ascultation bilaterally.No crackles or wheezes. Non tender to palpation   Cardiovascular system; Heart sounds normal,  S1 and  S2 ,no S3.  No murmur, or thrill. Apical beat not displaced Peripheral pulses normal.  Abdomen: Soft, non tender, no organomegaly or masses. No bruits. Bowel sounds normal. No guarding, tenderness or rebound.    Musculoskeletal exam: Full ROM of spine, hips , shoulders and reduced in  knees. Marked deformity ,swelling and crepitus noted.in knee No muscle wasting or atrophy.   Neurologic: Cranial nerves 2 to 12 intact. Power, tone ,sensation and reflexes normal throughout. No disturbance in gait. No tremor.  Skin: Intact, no ulceration, erythema , scaling or rash noted. Multiple freckles Skin tag on neck Psych; Normal mood and affect. Judgement and concentration normal   Assessment & Plan:  Encounter for annual physical exam Annual exam as documented. Counseling done  re healthy lifestyle involving commitment to 150 minutes exercise per week, heart healthy diet, and attaining healthy weight.The importance of adequate sleep also discussed. Regular seat belt use and home safety,  is also discussed. Changes in health habits are decided on by the patient with goals and time frames  set for achieving them. Immunization and cancer screening needs are specifically addressed at this visit.   Urinary incontinence Excess dry mouth on oxybutynin will change to [employer]

## 2022-08-10 NOTE — Assessment & Plan Note (Signed)
Excess dry mouth on oxybutynin will change to [employer]

## 2022-08-10 NOTE — Assessment & Plan Note (Signed)

## 2022-08-17 ENCOUNTER — Other Ambulatory Visit: Payer: Self-pay

## 2022-08-17 MED ORDER — MOTEGRITY 2 MG PO TABS: 2.0000 mg | ORAL_TABLET | Freq: Every day | ORAL | 3 refills | Status: DC

## 2022-08-17 NOTE — Telephone Encounter (Signed)
New script sent to pharmacy. Waiting for pt to call back.

## 2022-08-17 NOTE — Telephone Encounter (Signed)
Left message for pt to call back  °

## 2022-08-17 NOTE — Telephone Encounter (Signed)
Please make patient aware that her insurance company is requiring her to try other medications before they will pay for Trulance This is their decision and unfortunately not mine  Therefore she should try Motegrity 2 mg daily This is taken once daily She can try this and let me know if it is effective for her constipation.  She should also let me know if she has any side effects or problems with the new medication

## 2022-08-17 NOTE — Telephone Encounter (Signed)
PA Denied

## 2022-08-18 NOTE — Telephone Encounter (Signed)
Pt notified via mychart

## 2022-08-19 ENCOUNTER — Telehealth (INDEPENDENT_AMBULATORY_CARE_PROVIDER_SITE_OTHER): Payer: BC Managed Care – PPO | Admitting: Psychiatry

## 2022-08-19 ENCOUNTER — Encounter (HOSPITAL_COMMUNITY): Payer: Self-pay | Admitting: Psychiatry

## 2022-08-19 DIAGNOSIS — D485 Neoplasm of uncertain behavior of skin: Secondary | ICD-10-CM | POA: Diagnosis not present

## 2022-08-19 DIAGNOSIS — F331 Major depressive disorder, recurrent, moderate: Secondary | ICD-10-CM | POA: Diagnosis not present

## 2022-08-19 DIAGNOSIS — M1711 Unilateral primary osteoarthritis, right knee: Secondary | ICD-10-CM | POA: Diagnosis not present

## 2022-08-19 DIAGNOSIS — L820 Inflamed seborrheic keratosis: Secondary | ICD-10-CM | POA: Diagnosis not present

## 2022-08-19 DIAGNOSIS — Z96652 Presence of left artificial knee joint: Secondary | ICD-10-CM | POA: Diagnosis not present

## 2022-08-19 DIAGNOSIS — D225 Melanocytic nevi of trunk: Secondary | ICD-10-CM | POA: Diagnosis not present

## 2022-08-19 DIAGNOSIS — Z1283 Encounter for screening for malignant neoplasm of skin: Secondary | ICD-10-CM | POA: Diagnosis not present

## 2022-08-19 DIAGNOSIS — D2272 Melanocytic nevi of left lower limb, including hip: Secondary | ICD-10-CM | POA: Diagnosis not present

## 2022-08-19 MED ORDER — METHYLPHENIDATE HCL 10 MG PO TABS: 10.0000 mg | ORAL_TABLET | Freq: Two times a day (BID) | ORAL | 0 refills | Status: DC

## 2022-08-19 MED ORDER — CLONAZEPAM 0.5 MG PO TABS: 0.5000 mg | ORAL_TABLET | Freq: Two times a day (BID) | ORAL | 2 refills | Status: DC | PRN

## 2022-08-19 MED ORDER — PRAZOSIN HCL 2 MG PO CAPS: 2.0000 mg | ORAL_CAPSULE | Freq: Every day | ORAL | 3 refills | Status: DC

## 2022-08-19 MED ORDER — BUPROPION HCL ER (XL) 300 MG PO TB24: 300.0000 mg | ORAL_TABLET | ORAL | 2 refills | Status: DC

## 2022-08-19 MED ORDER — DULOXETINE HCL 60 MG PO CPEP: 120.0000 mg | ORAL_CAPSULE | Freq: Every day | ORAL | 3 refills | Status: DC

## 2022-08-19 NOTE — Progress Notes (Signed)
Virtual Visit via Video Note  I connected with Pamela Walters on 08/19/22 at 11:20 AM EDT by a video enabled telemedicine application and verified that I am speaking with the correct person using two identifiers.  Location: Patient: home Provider: office   I discussed the limitations of evaluation and management by telemedicine and the availability of in person appointments. The patient expressed understanding and agreed to proceed.     I discussed the assessment and treatment plan with the patient. The patient was provided an opportunity to ask questions and all were answered. The patient agreed with the plan and demonstrated an understanding of the instructions.   The patient was advised to call back or seek an in-person evaluation if the symptoms worsen or if the condition fails to improve as anticipated.  I provided 15 minutes of non-face-to-face time during this encounter.   [name], MD  [facility] MD/PA/NP OP Progress Note  08/19/2022 11:47 AM Pamela Walters  MRN:  [id number]  Chief Complaint:  Chief Complaint  Patient presents with   Depression   Anxiety   Follow-up   HPI: This patient is a 62 year old married white female who lives with her husband in [place].  She is retired as an Public house manager.  The patient returns for follow-up after 3 months.  She is doing better in terms of her mobility.  She had left knee replacement last November.  She is going to have to have the other knee done later this year.  She is making good progress.  She is now able to drive and is getting out some more.  She states that her mood is generally good.  She denies significant depression or anxiety.  She is still having trouble with going to sleep at a normal time.  She worked third shift for 36 years so it is difficult for her to switch around to going to bed at a reasonable time.  She is no longer having nightmares.  The Ritalin continues to help with her energy and focus. Visit Diagnosis:     ICD-10-CM   1. Major depressive disorder, recurrent episode, moderate  F33.1       Past Psychiatric History: none  Past Medical History:  Past Medical History:  Diagnosis Date   Allergy    Anemia    Anxiety    Arthritis    Back pain    Bell's palsy 08/2017   Depression    Depression    Phreesia 05/19/2020   Diverticulosis    GERD (gastroesophageal reflux disease)    Hepatic steatosis    Hiatal hernia    History of Bell's palsy 02/11/2018   Hyperlipidemia    Hypertension    Internal hemorrhoids    Internal hemorrhoids    MS (multiple sclerosis)    2007   Multiple sclerosis 04/04/2008   Qualifier: Diagnosis of  By: Lodema Hong MD, Margaret     Schatzki's ring    Sleep apnea    wears CPAP    Past Surgical History:  Procedure Laterality Date   ABDOMINAL HYSTERECTOMY  2005   fibroids   CARDIAC CATHETERIZATION N/A 05/04/2016   Procedure: Left Heart Cath and Coronary Angiography;  Surgeon: Corky Crafts, MD;  Location: [facility] INVASIVE CV LAB;  Service: Cardiovascular;  Laterality: N/A;   COLONOSCOPY     ESOPHAGOGASTRODUODENOSCOPY N/A 09/18/2013   Procedure: ESOPHAGOGASTRODUODENOSCOPY (EGD);  Surgeon: Beverley Fiedler, MD;  Location: [facility] ENDOSCOPY;  Service: Gastroenterology;  Laterality: N/A;   REPLACEMENT TOTAL KNEE Left  03/11/2022   UPPER GASTROINTESTINAL ENDOSCOPY     WISDOM TOOTH EXTRACTION      Family Psychiatric History: see below  Family History:  Family History  Problem Relation Age of Onset   Hypertension Mother    Hyperlipidemia Mother    Depression Mother    Hypertension Father    Hypertension Sister    High Cholesterol Sister    Healthy Son    Healthy Daughter    Colon cancer Neg Hx    Esophageal cancer Neg Hx    Rectal cancer Neg Hx    Stomach cancer Neg Hx    Colon polyps Neg Hx     Social History:  Social History   Socioeconomic History   Marital status: Married    Spouse name: Not on file   Number of children: 2   Years of education: Not  on file   Highest education level: Not on file  Occupational History   Occupation: retired  Tobacco Use   Smoking status: Never   Smokeless tobacco: Never  Vaping Use   Vaping Use: Never used  Substance and Sexual Activity   Alcohol use: No   Drug use: No   Sexual activity: Yes    Birth control/protection: Surgical    Comment: hyst  Other Topics Concern   Not on file  Social History Narrative   Not on file   Social Determinants of Health   Financial Resource Strain: Low Risk  (06/05/2020)   Overall Financial Resource Strain (CARDIA)    Difficulty of Paying Living Expenses: Not very hard  Food Insecurity: No Food Insecurity (06/05/2020)   Hunger Vital Sign    Worried About Running Out of Food in the Last Year: Never true    Ran Out of Food in the Last Year: Never true  Transportation Needs: No Transportation Needs (06/05/2020)   PRAPARE - Administrator, Civil Service (Medical): No    Lack of Transportation (Non-Medical): No  Physical Activity: Insufficiently Active (06/05/2020)   Exercise Vital Sign    Days of Exercise per Week: 3 days    Minutes of Exercise per Session: 30 min  Stress: Stress Concern Present (06/05/2020)   [employer] of Occupational Health - Occupational Stress Questionnaire    Feeling of Stress : Very much  Social Connections: Moderately Isolated (06/05/2020)   Social Connection and Isolation Panel [NHANES]    Frequency of Communication with Friends and Family: Once a week    Frequency of Social Gatherings with Friends and Family: Once a week    Attends Religious Services: More than 4 times per year    Active Member of [employer] or Organizations: No    Attends Banker Meetings: Never    Marital Status: Married    Allergies: No Known Allergies  Metabolic Disorder Labs: Lab Results  Component Value Date   HGBA1C 5.5 05/10/2017   MPG 111 05/10/2017   MPG 108 02/20/2016   No results found for: "PROLACTIN" Lab Results   Component Value Date   CHOL 146 07/16/2022   TRIG 159 (H) 07/16/2022   HDL 39 (L) 07/16/2022   CHOLHDL 3.7 07/16/2022   VLDL 34 (H) 06/30/2016   LDLCALC 79 07/16/2022   LDLCALC 81 01/14/2022   Lab Results  Component Value Date   TSH 3.210 07/16/2022   TSH 1.990 01/14/2022    Therapeutic Level Labs: No results found for: "LITHIUM" No results found for: "VALPROATE" No results found for: "CBMZ"  Current Medications: Current  Outpatient Medications  Medication Sig Dispense Refill   methylphenidate (RITALIN) 10 MG tablet Take 1 tablet (10 mg total) by mouth 2 (two) times daily with breakfast and lunch. 60 tablet 0   methylphenidate (RITALIN) 10 MG tablet Take 1 tablet (10 mg total) by mouth 2 (two) times daily with breakfast and lunch. 60 tablet 0   amLODipine (NORVASC) 2.5 MG tablet Take 1 tablet (2.5 mg total) by mouth daily. 90 tablet 1   amLODipine (NORVASC) 5 MG tablet TAKE 1 TABLET BY MOUTH DAILY 90 tablet 3   aspirin 81 MG tablet Take 81 mg by mouth daily.     buPROPion (WELLBUTRIN XL) 300 MG 24 hr tablet Take 1 tablet (300 mg total) by mouth every morning. 30 tablet 2   clonazePAM (KLONOPIN) 0.5 MG tablet Take 1 tablet (0.5 mg total) by mouth 2 (two) times daily as needed. for anxiety 60 tablet 2   DULoxetine (CYMBALTA) 60 MG capsule Take 2 capsules (120 mg total) by mouth daily. 180 capsule 3   ezetimibe (ZETIA) 10 MG tablet Take 1 tablet (10 mg total) by mouth daily. 90 tablet 3   fenofibrate (TRICOR) 48 MG tablet Take 1 tablet (48 mg total) by mouth daily. 90 tablet 1   gabapentin (NEURONTIN) 600 MG tablet Take 1 tablet (600 mg total) by mouth 3 (three) times daily. 270 tablet 0   KLOR-CON M20 20 MEQ tablet Take 1 tablet by mouth once daily 30 tablet 0   meclizine (ANTIVERT) 12.5 MG tablet Take 1 tablet (12.5 mg total) by mouth 3 (three) times daily as needed for dizziness. 30 tablet 0   metaxalone (SKELAXIN) 800 MG tablet Take one tablet  by mouth at bedtime  for muscle  spasm 90 tablet 1   methylphenidate (RITALIN) 10 MG tablet Take 1 tablet (10 mg total) by mouth 2 (two) times daily with breakfast and lunch. 60 tablet 0   mirabegron ER (MYRBETRIQ) 25 MG TB24 tablet Take 1 tablet (25 mg total) by mouth daily. 30 tablet 3   Multiple Vitamin (MULTIVITAMIN WITH MINERALS) TABS tablet Take 1 tablet by mouth daily.     pantoprazole (PROTONIX) 40 MG tablet Take 1 tablet (40 mg total) by mouth 2 (two) times daily before a meal. 60 tablet 3   Plecanatide (TRULANCE) 3 MG TABS Take 1 tablet (3 mg total) by mouth daily. 30 tablet 3   prazosin (MINIPRESS) 2 MG capsule Take 1 capsule (2 mg total) by mouth at bedtime. 90 capsule 3   Prucalopride Succinate (MOTEGRITY) 2 MG TABS Take 1 tablet (2 mg total) by mouth daily. 30 tablet 3   rosuvastatin (CRESTOR) 40 MG tablet Take 1 tablet (40 mg total) by mouth daily. 90 tablet 3   Vitamin D, Ergocalciferol, (DRISDOL) 1.25 MG (50000 UNIT) CAPS capsule Take 1 capsule (50,000 Units total) by mouth every 7 (seven) days. 5 capsule 8   zonisamide (ZONEGRAN) 100 MG capsule Take 1 capsule by mouth once daily 15 capsule 0   No current facility-administered medications for this visit.   Facility-Administered Medications Ordered in Other Visits  Medication Dose Route Frequency Provider Last Rate Last Admin   gadopentetate dimeglumine (MAGNEVIST) injection 20 mL  20 mL Intravenous Once PRN Sater, Pearletha Furl, MD         Musculoskeletal: Strength & Muscle Tone: within normal limits Gait & Station: unsteady Patient leans: N/A  Psychiatric Specialty Exam: Review of Systems  Musculoskeletal:  Positive for arthralgias and gait problem.  Psychiatric/Behavioral:  Positive for sleep disturbance.   All other systems reviewed and are negative.   There were no vitals taken for this visit.There is no height or weight on file to calculate BMI.  General Appearance: Casual and Fairly Groomed  Eye Contact:  Good  Speech:  Clear and Coherent   Volume:  Normal  Mood:  Euthymic  Affect:  Congruent  Thought Process:  Goal Directed  Orientation:  Full (Time, Place, and Person)  Thought Content: WDL   Suicidal Thoughts:  No  Homicidal Thoughts:  No  Memory:  Immediate;   Good Recent;   Good Remote;   Good  Judgement:  Good  Insight:  Good  Psychomotor Activity:  Normal  Concentration:  Concentration: Good and Attention Span: Good  Recall:  Good  Fund of Knowledge: Good  Language: Good  Akathisia:  No  Handed:  Right  AIMS (if indicated): not done  Assets:  Communication Skills Desire for Improvement Resilience Social Support Talents/Skills  ADL's:  Intact  Cognition: WNL  Sleep:  Fair   Screenings: GAD-7    Flowsheet Row Office Visit from 08/06/2022 in [facility] Office Visit from 07/16/2022 in [facility] Office Visit from 12/31/2021 in [facility] Office Visit from 06/05/2020 in [facility]  Total GAD-7 Score 7 8 6 14       PHQ2-9    Flowsheet Row Office Visit from 08/06/2022 in [facility] Office Visit from 07/16/2022 in [facility] Office Visit from 01/14/2022 in [facility] Office Visit from 12/31/2021 in [facility] Care Video Visit from 07/11/2021 in [facility] at [facility] Total Score 3 4 0 0 5  PHQ-9 Total Score 13 13 -- -- 13      Flowsheet Row Video Visit from 07/11/2021 in [facility] [place] Video Visit from 02/19/2021 in [facility] at [place] Video Visit from 01/23/2021 in [facility] at [place]  C-SSRS RISK CATEGORY No Risk No Risk No Risk        Assessment and Plan: This patient is a 62 year old female with a history of depression anxiety chronic fatigue and  fibromyalgia.  For the most part her regimen has helped although I urged her to try to get into a more normal sleep cycle rather than staying up till 4 in the morning.  She will continue Wellbutrin XL 300 mg daily as well as 60 mg as well as Cymbalta 60 mg twice daily for depression, prazosin 2 mg at bedtime to prevent nightmares, clonazepam 0.5 mg twice daily for anxiety and meth methylphenidate 10 mg twice daily for focus and alertness.  She will return to see me in 3 months  Collaboration of Care: Collaboration of Care: Primary Care Provider AEB not associated with PCP through the epic system  Patient/Guardian was advised Release of Information must be obtained prior to any record release in order to collaborate their care with an outside provider. Patient/Guardian was advised if they have not already done so to contact the registration department to sign all necessary forms in order for [place] to release information regarding their care.   Consent: Patient/Guardian gives verbal consent for treatment and assignment of benefits for services provided during this visit. Patient/Guardian expressed understanding and agreed to proceed.    [name], MD 08/19/2022,  11:47 AM

## 2022-08-21 ENCOUNTER — Ambulatory Visit (HOSPITAL_COMMUNITY): Payer: BC Managed Care – PPO

## 2022-08-27 ENCOUNTER — Ambulatory Visit: Payer: BC Managed Care – PPO | Admitting: Family Medicine

## 2022-09-09 ENCOUNTER — Other Ambulatory Visit (HOSPITAL_COMMUNITY): Payer: Self-pay

## 2022-09-09 ENCOUNTER — Telehealth: Payer: Self-pay | Admitting: Pharmacy Technician

## 2022-09-09 NOTE — Telephone Encounter (Signed)
Patient Advocate Encounter  Received notification from [employer] that prior authorization for PANTOPRAZOLE 40MG  is required.   PA submitted on 5.15.24 Key [id number] Status is pending

## 2022-09-10 ENCOUNTER — Other Ambulatory Visit (HOSPITAL_COMMUNITY): Payer: Self-pay

## 2022-09-10 NOTE — Telephone Encounter (Signed)
Patient Advocate Encounter  Prior Authorization for PANTOPRAZOLE 40MG  has been approved with [employer].    PA# [id number] Effective dates: 5.15.24 through 5.15.25  Per [facility] test claim, copay for 30 days supply is $0

## 2022-09-23 ENCOUNTER — Other Ambulatory Visit: Payer: Self-pay | Admitting: Family Medicine

## 2022-11-11 DIAGNOSIS — Z1283 Encounter for screening for malignant neoplasm of skin: Secondary | ICD-10-CM | POA: Diagnosis not present

## 2022-11-11 DIAGNOSIS — D225 Melanocytic nevi of trunk: Secondary | ICD-10-CM | POA: Diagnosis not present

## 2022-11-16 ENCOUNTER — Encounter: Payer: Self-pay | Admitting: Internal Medicine

## 2022-11-26 ENCOUNTER — Ambulatory Visit: Payer: BC Managed Care – PPO | Admitting: Family Medicine

## 2022-12-01 ENCOUNTER — Other Ambulatory Visit: Payer: Self-pay | Admitting: Family Medicine

## 2022-12-03 ENCOUNTER — Ambulatory Visit: Payer: BC Managed Care – PPO | Admitting: Family Medicine

## 2022-12-04 ENCOUNTER — Encounter: Payer: BC Managed Care – PPO | Admitting: Internal Medicine

## 2022-12-21 DIAGNOSIS — I10 Essential (primary) hypertension: Secondary | ICD-10-CM | POA: Diagnosis not present

## 2022-12-21 DIAGNOSIS — E785 Hyperlipidemia, unspecified: Secondary | ICD-10-CM | POA: Diagnosis not present

## 2022-12-21 DIAGNOSIS — M25561 Pain in right knee: Secondary | ICD-10-CM | POA: Diagnosis not present

## 2022-12-21 DIAGNOSIS — M1711 Unilateral primary osteoarthritis, right knee: Secondary | ICD-10-CM | POA: Diagnosis not present

## 2022-12-22 ENCOUNTER — Ambulatory Visit: Payer: BC Managed Care – PPO | Admitting: Family Medicine

## 2022-12-22 ENCOUNTER — Ambulatory Visit (HOSPITAL_COMMUNITY)
Admission: RE | Admit: 2022-12-22 | Discharge: 2022-12-22 | Disposition: A | Discharge status: Home / Self Care | Payer: BC Managed Care – PPO | Source: Ambulatory Visit | Attending: Family Medicine | Admitting: Family Medicine

## 2022-12-22 ENCOUNTER — Other Ambulatory Visit: Payer: Self-pay

## 2022-12-22 ENCOUNTER — Encounter: Payer: Self-pay | Admitting: Family Medicine

## 2022-12-22 VITALS — BP 147/79 | HR 84 | Ht 62.0 in | Wt 168.1 lb

## 2022-12-22 DIAGNOSIS — Z01818 Encounter for other preprocedural examination: Secondary | ICD-10-CM | POA: Insufficient documentation

## 2022-12-22 DIAGNOSIS — I10 Essential (primary) hypertension: Secondary | ICD-10-CM | POA: Insufficient documentation

## 2022-12-22 DIAGNOSIS — Z96651 Presence of right artificial knee joint: Secondary | ICD-10-CM | POA: Diagnosis not present

## 2022-12-22 DIAGNOSIS — Z23 Encounter for immunization: Secondary | ICD-10-CM

## 2022-12-22 DIAGNOSIS — R7302 Impaired glucose tolerance (oral): Secondary | ICD-10-CM

## 2022-12-22 LAB — CMP14+EGFR
AST: 21 IU/L (ref 0–40)
Alkaline Phosphatase: 85 IU/L (ref 44–121)
Calcium: 9.2 mg/dL (ref 8.7–10.3)
Chloride: 103 mmol/L (ref 96–106)
Globulin, Total: 2.5 g/dL (ref 1.5–4.5)
Glucose: 117 mg/dL — ABNORMAL HIGH (ref 70–99)
Total Protein: 6.8 g/dL (ref 6.0–8.5)

## 2022-12-22 LAB — LIPID PANEL: Cholesterol, Total: 107 mg/dL (ref 100–199)

## 2022-12-22 MED ORDER — MIRABEGRON ER 25 MG PO TB24: 25.0000 mg | ORAL_TABLET | Freq: Every day | ORAL | 1 refills | Status: DC

## 2022-12-22 MED ORDER — METAXALONE 800 MG PO TABS: ORAL_TABLET | ORAL | 1 refills | Status: DC

## 2022-12-22 MED ORDER — POTASSIUM CHLORIDE CRYS ER 20 MEQ PO TBCR
20.0000 meq | EXTENDED_RELEASE_TABLET | Freq: Two times a day (BID) | ORAL | 2 refills | Status: DC
Start: 2022-12-22 — End: 2023-12-28

## 2022-12-22 MED ORDER — GABAPENTIN 600 MG PO TABS: 600.0000 mg | ORAL_TABLET | Freq: Three times a day (TID) | ORAL | 1 refills | Status: DC

## 2022-12-22 MED ORDER — FENOFIBRATE 48 MG PO TABS: 48.0000 mg | ORAL_TABLET | Freq: Every day | ORAL | 2 refills | Status: DC

## 2022-12-22 NOTE — Progress Notes (Signed)
   Pamela Walters     MRN: [id number]      DOB: 03/02/1961  Chief Complaint  Patient presents with   Follow-up    Follow up sx clearance for other knee    HPI Pamela Walters is here for medical clearance for upcoming left knee surgery  ROS Denies recent fever or chills. 1 week h/o frontal and maxillary pressure, no ear pain, sore throat or fever Denies chest congestion, productive cough or wheezing. Denies chest pains, palpitations and leg swelling Denies abdominal pain, nausea, vomiting,diarrhea or constipation.   Denies dysuria, frequency, hesitancy or incontinence. Right knee pain , limitation in movement , pain ranges from 3 to 8 Denies headaches, seizures, numbness, or tingling. Chronic  depression, anxiety or insomnia.Controlled on meds and managed by Psychiatry Denies skin break down or rash.   PE  BP (!) 147/79 (BP Location: Left Arm, Patient Position: Sitting, Cuff Size: Normal)   Pulse 84   Ht 5\' 2"  (1.575 m)   Wt 168 lb 1.3 oz (76.2 kg)   SpO2 96%   BMI 30.74 kg/m   Patient alert and oriented and in no cardiopulmonary distress.  HEENT: No facial asymmetry, EOMI,     Neck supple .No sinus tenderness, TM clear bilateally  Chest: Clear to auscultation bilaterally.  CVS: S1, S2 no murmurs, no S3.Regular rate.  ABD: Soft non tender.   Ext: No edema  MS: Adequate ROM spine, shoulders, hips and markedly reduced in left knee.  Skin: Intact, no ulcerations or rash noted.  Psych: Good eye contact, normal affect. Memory intact not anxious or depressed appearing.  CNS: CN 2-12 intact, power,  normal throughout.no focal deficits noted.   Assessment & Plan  Pre-op evaluation Cleared pending improvement of bP. Amlodipine doubled to 10 mg daily, re eval in 2 weeks, if at goal then cleared medically EKG: No acute ischemia , sinus rhythm Basic labs and CXR obtained and are within normal  Immunization due After obtaining informed consent, the influenza vaccine is   administered , with no adverse effect noted at the time of administration.   Essential hypertension Uncontrolled , increase dose of amlodipine to 10 mg daily and re eval in 2 weeks, if within normal then will be medically cleared for surgery

## 2022-12-22 NOTE — Patient Instructions (Addendum)
F/U in mid January, call if you need me sooner Blood pressure is high today, INCREASE total daily dose of amlodipine to 10 mg daily starting today  (Easiest to to take 5mg  TWO tabs daily  Nurse BP check in 2 weeks in office , Dr Lodema Hong needs to be notified , so clearance can be completed and sent  CBC today  CXR today  EKG today  Flu vaccine today  Blood test shows normal blood sugar, good  Thanks for choosing [facility], we consider it a privelige to serve you. All the best with Surgery!

## 2022-12-23 LAB — CBC
Hematocrit: 36.6 % (ref 34.0–46.6)
Hemoglobin: 11.6 g/dL (ref 11.1–15.9)
MCH: 26.1 pg — ABNORMAL LOW (ref 26.6–33.0)
MCHC: 31.7 g/dL (ref 31.5–35.7)
MCV: 82 fL (ref 79–97)
Platelets: 333 10*3/uL (ref 150–450)
RBC: 4.45 x10E6/uL (ref 3.77–5.28)
RDW: 15.7 % — ABNORMAL HIGH (ref 11.7–15.4)
WBC: 6.7 10*3/uL (ref 3.4–10.8)

## 2022-12-24 ENCOUNTER — Encounter (HOSPITAL_COMMUNITY): Payer: Self-pay | Admitting: Psychiatry

## 2022-12-24 ENCOUNTER — Telehealth (HOSPITAL_COMMUNITY): Payer: BC Managed Care – PPO | Admitting: Psychiatry

## 2022-12-24 DIAGNOSIS — F331 Major depressive disorder, recurrent, moderate: Secondary | ICD-10-CM

## 2022-12-24 MED ORDER — METHYLPHENIDATE HCL 10 MG PO TABS: 10.0000 mg | ORAL_TABLET | Freq: Two times a day (BID) | ORAL | 0 refills | Status: DC

## 2022-12-24 MED ORDER — CLONAZEPAM 0.5 MG PO TABS: 0.5000 mg | ORAL_TABLET | Freq: Two times a day (BID) | ORAL | 2 refills | Status: DC | PRN

## 2022-12-24 MED ORDER — BUPROPION HCL ER (XL) 300 MG PO TB24: 300.0000 mg | ORAL_TABLET | ORAL | 2 refills | Status: DC

## 2022-12-24 MED ORDER — PRAZOSIN HCL 2 MG PO CAPS: 2.0000 mg | ORAL_CAPSULE | Freq: Every day | ORAL | 3 refills | Status: DC

## 2022-12-24 MED ORDER — DULOXETINE HCL 60 MG PO CPEP: 120.0000 mg | ORAL_CAPSULE | Freq: Every day | ORAL | 3 refills | Status: DC

## 2022-12-24 NOTE — Progress Notes (Deleted)
BH MD/PA/NP OP Progress Note  12/24/2022 2:28 PM ASHA LEVENDUSKY  MRN:  [id number]  Chief Complaint:  Chief Complaint  Patient presents with   Anxiety   Depression   Follow-up   HPI: *** Visit Diagnosis:    ICD-10-CM   1. Major depressive disorder, recurrent episode, moderate (HCC)  F33.1       Past Psychiatric History: ***  Past Medical History:  Past Medical History:  Diagnosis Date   Allergy    Anemia    Anxiety    Arthritis    Back pain    Bell's palsy 08/2017   Depression    Depression    Phreesia 05/19/2020   Diverticulosis    GERD (gastroesophageal reflux disease)    Hepatic steatosis    Hiatal hernia    History of Bell's palsy 02/11/2018   Hyperlipidemia    Hypertension    Internal hemorrhoids    Internal hemorrhoids    MS (multiple sclerosis) (HCC)    2007   Multiple sclerosis (HCC) 04/04/2008   Qualifier: Diagnosis of  By: Lodema Hong MD, Margaret     Schatzki's ring    Sleep apnea    wears CPAP    Past Surgical History:  Procedure Laterality Date   ABDOMINAL HYSTERECTOMY  2005   fibroids   CARDIAC CATHETERIZATION N/A 05/04/2016   Procedure: Left Heart Cath and Coronary Angiography;  Surgeon: Corky Crafts, MD;  Location: [facility] INVASIVE CV LAB;  Service: Cardiovascular;  Laterality: N/A;   COLONOSCOPY     ESOPHAGOGASTRODUODENOSCOPY N/A 09/18/2013   Procedure: ESOPHAGOGASTRODUODENOSCOPY (EGD);  Surgeon: Beverley Fiedler, MD;  Location: [facility] ENDOSCOPY;  Service: Gastroenterology;  Laterality: N/A;   REPLACEMENT TOTAL KNEE Left 03/11/2022   UPPER GASTROINTESTINAL ENDOSCOPY     WISDOM TOOTH EXTRACTION      Family Psychiatric History: ***  Family History:  Family History  Problem Relation Age of Onset   Hypertension Mother    Hyperlipidemia Mother    Depression Mother    Hypertension Father    Hypertension Sister    High Cholesterol Sister    Healthy Son    Healthy Daughter    Colon cancer Neg Hx    Esophageal cancer Neg Hx    Rectal cancer  Neg Hx    Stomach cancer Neg Hx    Colon polyps Neg Hx     Social History:  Social History   Socioeconomic History   Marital status: Married    Spouse name: Not on file   Number of children: 2   Years of education: Not on file   Highest education level: Not on file  Occupational History   Occupation: retired  Tobacco Use   Smoking status: Never   Smokeless tobacco: Never  Vaping Use   Vaping status: Never Used  Substance and Sexual Activity   Alcohol use: No   Drug use: No   Sexual activity: Yes    Birth control/protection: Surgical    Comment: hyst  Other Topics Concern   Not on file  Social History Narrative   Not on file   Social Determinants of Health   Financial Resource Strain: Low Risk  (06/05/2020)   Overall Financial Resource Strain (CARDIA)    Difficulty of Paying Living Expenses: Not very hard  Food Insecurity: No Food Insecurity (06/05/2020)   Hunger Vital Sign    Worried About Running Out of Food in the Last Year: Never true    Ran Out of Food in the Last Year:  Never true  Transportation Needs: No Transportation Needs (06/05/2020)   PRAPARE - Administrator, Civil Service (Medical): No    Lack of Transportation (Non-Medical): No  Physical Activity: Insufficiently Active (06/05/2020)   Exercise Vital Sign    Days of Exercise per Week: 3 days    Minutes of Exercise per Session: 30 min  Stress: Stress Concern Present (06/05/2020)   [employer] of Occupational Health - Occupational Stress Questionnaire    Feeling of Stress : Very much  Social Connections: Moderately Isolated (06/05/2020)   Social Connection and Isolation Panel [NHANES]    Frequency of Communication with Friends and Family: Once a week    Frequency of Social Gatherings with Friends and Family: Once a week    Attends Religious Services: More than 4 times per year    Active Member of Clubs or Organizations: No    Attends Banker Meetings: Never    Marital Status:  Married    Allergies: No Known Allergies  Metabolic Disorder Labs: Lab Results  Component Value Date   HGBA1C 5.5 05/10/2017   MPG 111 05/10/2017   MPG 108 02/20/2016   No results found for: "PROLACTIN" Lab Results  Component Value Date   CHOL 107 12/21/2022   TRIG 93 12/21/2022   HDL 45 12/21/2022   CHOLHDL 2.4 12/21/2022   VLDL 34 (H) 06/30/2016   LDLCALC 44 12/21/2022   LDLCALC 79 07/16/2022   Lab Results  Component Value Date   TSH 3.210 07/16/2022   TSH 1.990 01/14/2022    Therapeutic Level Labs: No results found for: "LITHIUM" No results found for: "VALPROATE" No results found for: "CBMZ"  Current Medications: Current Outpatient Medications  Medication Sig Dispense Refill   methylphenidate (RITALIN) 10 MG tablet Take 1 tablet (10 mg total) by mouth 2 (two) times daily with breakfast and lunch. 60 tablet 0   amLODipine (NORVASC) 2.5 MG tablet TAKE 1 TABLET BY MOUTH DAILY  WITH AMLODIPINE 5MG  TABLET. 90 tablet 3   amLODipine (NORVASC) 5 MG tablet TAKE 1 TABLET BY MOUTH DAILY 90 tablet 3   aspirin 81 MG tablet Take 81 mg by mouth daily.     buPROPion (WELLBUTRIN XL) 300 MG 24 hr tablet Take 1 tablet (300 mg total) by mouth every morning. 30 tablet 2   clonazePAM (KLONOPIN) 0.5 MG tablet Take 1 tablet (0.5 mg total) by mouth 2 (two) times daily as needed. for anxiety 60 tablet 2   DULoxetine (CYMBALTA) 60 MG capsule Take 2 capsules (120 mg total) by mouth daily. 180 capsule 3   ezetimibe (ZETIA) 10 MG tablet Take 1 tablet (10 mg total) by mouth daily. 90 tablet 3   fenofibrate (TRICOR) 48 MG tablet Take 1 tablet (48 mg total) by mouth daily. 90 tablet 2   gabapentin (NEURONTIN) 600 MG tablet Take 1 tablet (600 mg total) by mouth 3 (three) times daily. 270 tablet 1   metaxalone (SKELAXIN) 800 MG tablet Take one tablet  by mouth at bedtime  for muscle spasm 90 tablet 1   methylphenidate (RITALIN) 10 MG tablet Take 1 tablet (10 mg total) by mouth 2 (two) times daily  with breakfast and lunch. 60 tablet 0   methylphenidate (RITALIN) 10 MG tablet Take 1 tablet (10 mg total) by mouth 2 (two) times daily with breakfast and lunch. 60 tablet 0   mirabegron ER (MYRBETRIQ) 25 MG TB24 tablet Take 1 tablet (25 mg total) by mouth daily. 90 tablet 1  Multiple Vitamin (MULTIVITAMIN WITH MINERALS) TABS tablet Take 1 tablet by mouth daily.     pantoprazole (PROTONIX) 40 MG tablet Take 1 tablet (40 mg total) by mouth 2 (two) times daily before a meal. 60 tablet 3   potassium chloride SA (KLOR-CON M) 20 MEQ tablet Take 1 tablet (20 mEq total) by mouth 2 (two) times daily. 180 tablet 2   prazosin (MINIPRESS) 2 MG capsule Take 1 capsule (2 mg total) by mouth at bedtime. 90 capsule 3   rosuvastatin (CRESTOR) 40 MG tablet Take 1 tablet (40 mg total) by mouth daily. 90 tablet 3   Vitamin D, Ergocalciferol, (DRISDOL) 1.25 MG (50000 UNIT) CAPS capsule Take 1 capsule (50,000 Units total) by mouth every 7 (seven) days. 5 capsule 8   No current facility-administered medications for this visit.   Facility-Administered Medications Ordered in Other Visits  Medication Dose Route Frequency Provider Last Rate Last Admin   gadopentetate dimeglumine (MAGNEVIST) injection 20 mL  20 mL Intravenous Once PRN Sater, Pearletha Furl, MD         Musculoskeletal: Strength & Muscle Tone: {desc; muscle tone:32375} Gait & Station: {PE GAIT ED UJWJ:19147} Patient leans: {Patient Leans:21022755}  Psychiatric Specialty Exam: Review of Systems  There were no vitals taken for this visit.There is no height or weight on file to calculate BMI.  General Appearance: {Appearance:22683}  Eye Contact:  {BHH EYE CONTACT:22684}  Speech:  {Speech:22685}  Volume:  {Volume (PAA):22686}  Mood:  {BHH MOOD:22306}  Affect:  {Affect (PAA):22687}  Thought Process:  {Thought Process (PAA):22688}  Orientation:  {BHH ORIENTATION (PAA):22689}  Thought Content: {Thought Content:22690}   Suicidal Thoughts:  {ST/HT  (PAA):22692}  Homicidal Thoughts:  {ST/HT (PAA):22692}  Memory:  {BHH MEMORY:22881}  Judgement:  {Judgement (PAA):22694}  Insight:  {Insight (PAA):22695}  Psychomotor Activity:  {Psychomotor (PAA):22696}  Concentration:  {Concentration:21399}  Recall:  {BHH GOOD/FAIR/POOR:22877}  Fund of Knowledge: {BHH GOOD/FAIR/POOR:22877}  Language: {BHH GOOD/FAIR/POOR:22877}  Akathisia:  {BHH YES OR NO:22294}  Handed:  {Handed:22697}  AIMS (if indicated): {Desc; done/not:10129}  Assets:  {Assets (PAA):22698}  ADL's:  {BHH WGN'F:62130}  Cognition: {chl bhh cognition:304700322}  Sleep:  {BHH GOOD/FAIR/POOR:22877}   Screenings: GAD-7    Flowsheet Row Office Visit from 12/22/2022 in [facility] Office Visit from 08/06/2022 in [facility] Care Office Visit from 07/16/2022 in [facility] Office Visit from 12/31/2021 in [facility] Office Visit from 06/05/2020 in [facility]  Total GAD-7 Score 10 7 8 6 14       PHQ2-9    Flowsheet Row Office Visit from 12/22/2022 in [facility] Primary Care Office Visit from 08/06/2022 in [facility] Care Office Visit from 07/16/2022 in [facility] Care Office Visit from 01/14/2022 in [facility] Primary Care Office Visit from 12/31/2021 in [facility]  PHQ-2 Total Score 2 3 4  0 0  PHQ-9 Total Score 12 13 13  -- --      Flowsheet Row Video Visit from 07/11/2021 in [facility] at [place] Video Visit from 02/19/2021 in [facility] at [place] Video Visit from 01/23/2021 in [facility] at [place]  C-SSRS RISK CATEGORY No Risk No Risk No Risk        Assessment and Plan: ***  Collaboration of Care: Collaboration of Care: [facility] OP Collaboration of  QMVH:[id number]}  Patient/Guardian was advised  Release of Information must be obtained prior to any record release in order to collaborate their care with an outside provider. Patient/Guardian was advised if they have not already done so to contact the registration department to sign all necessary forms in order for [place] to release information regarding their care.   Consent: Patient/Guardian gives verbal consent for treatment and assignment of benefits for services provided during this visit. Patient/Guardian expressed understanding and agreed to proceed.    [name], MD 12/24/2022, 2:28 PM

## 2022-12-24 NOTE — Progress Notes (Signed)
Virtual Visit via Video Note  I connected with Pamela Walters on 12/24/22 at  2:20 PM EDT by a video enabled telemedicine application and verified that I am speaking with the correct person using two identifiers.  Location: Patient: home Provider: office   I discussed the limitations of evaluation and management by telemedicine and the availability of in person appointments. The patient expressed understanding and agreed to proceed.    I discussed the assessment and treatment plan with the patient. The patient was provided an opportunity to ask questions and all were answered. The patient agreed with the plan and demonstrated an understanding of the instructions.   The patient was advised to call back or seek an in-person evaluation if the symptoms worsen or if the condition fails to improve as anticipated.  I provided 15 minutes of non-face-to-face time during this encounter.   Pamela Ruder, MD  [facility] MD/PA/NP OP Progress Note  12/24/2022 2:29 PM URI NARAGON  MRN:  [id number]  Chief Complaint:  Chief Complaint  Patient presents with   Anxiety   Depression   Follow-up   HPI: This patient is a 62 year old married white female who lives with her husband in [place].  She has retired as an Public house manager.  The patient returns for follow-up after 4 months.  Overall she has had a good summer.  She had left knee replacement last November and coming up soon she is going to have the right knee replaced.  She is hoping after that she will be able to be more mobile and perhaps even work part-time.  She states that her mood is generally good.  She denies significant depression anxiety thoughts of self-harm or suicide.  She still has trouble sleeping at night because of her long-term third shift experience.  However she gets 7 to 8 hours of sleep.  She is no longer having nightmares.  The Ritalin continues to help her with her energy and focus Visit Diagnosis:    ICD-10-CM   1. Major depressive  disorder, recurrent episode, moderate (HCC)  F33.1       Past Psychiatric History: none  Past Medical History:  Past Medical History:  Diagnosis Date   Allergy    Anemia    Anxiety    Arthritis    Back pain    Bell's palsy 08/2017   Depression    Depression    Phreesia 05/19/2020   Diverticulosis    GERD (gastroesophageal reflux disease)    Hepatic steatosis    Hiatal hernia    History of Bell's palsy 02/11/2018   Hyperlipidemia    Hypertension    Internal hemorrhoids    Internal hemorrhoids    MS (multiple sclerosis) (HCC)    2007   Multiple sclerosis (HCC) 04/04/2008   Qualifier: Diagnosis of  By: Lodema Hong MD, Margaret     Schatzki's ring    Sleep apnea    wears CPAP    Past Surgical History:  Procedure Laterality Date   ABDOMINAL HYSTERECTOMY  2005   fibroids   CARDIAC CATHETERIZATION N/A 05/04/2016   Procedure: Left Heart Cath and Coronary Angiography;  Surgeon: Corky Crafts, MD;  Location: [facility] INVASIVE CV LAB;  Service: Cardiovascular;  Laterality: N/A;   COLONOSCOPY     ESOPHAGOGASTRODUODENOSCOPY N/A 09/18/2013   Procedure: ESOPHAGOGASTRODUODENOSCOPY (EGD);  Surgeon: Beverley Fiedler, MD;  Location: [facility] ENDOSCOPY;  Service: Gastroenterology;  Laterality: N/A;   REPLACEMENT TOTAL KNEE Left 03/11/2022   UPPER GASTROINTESTINAL ENDOSCOPY  WISDOM TOOTH EXTRACTION      Family Psychiatric History: See below  Family History:  Family History  Problem Relation Age of Onset   Hypertension Mother    Hyperlipidemia Mother    Depression Mother    Hypertension Father    Hypertension Sister    High Cholesterol Sister    Healthy Son    Healthy Daughter    Colon cancer Neg Hx    Esophageal cancer Neg Hx    Rectal cancer Neg Hx    Stomach cancer Neg Hx    Colon polyps Neg Hx     Social History:  Social History   Socioeconomic History   Marital status: Married    Spouse name: Not on file   Number of children: 2   Years of education: Not on file    Highest education level: Not on file  Occupational History   Occupation: retired  Tobacco Use   Smoking status: Never   Smokeless tobacco: Never  Vaping Use   Vaping status: Never Used  Substance and Sexual Activity   Alcohol use: No   Drug use: No   Sexual activity: Yes    Birth control/protection: Surgical    Comment: hyst  Other Topics Concern   Not on file  Social History Narrative   Not on file   Social Determinants of Health   Financial Resource Strain: Low Risk  (06/05/2020)   Overall Financial Resource Strain (CARDIA)    Difficulty of Paying Living Expenses: Not very hard  Food Insecurity: No Food Insecurity (06/05/2020)   Hunger Vital Sign    Worried About Running Out of Food in the Last Year: Never true    Ran Out of Food in the Last Year: Never true  Transportation Needs: No Transportation Needs (06/05/2020)   PRAPARE - Administrator, Civil Service (Medical): No    Lack of Transportation (Non-Medical): No  Physical Activity: Insufficiently Active (06/05/2020)   Exercise Vital Sign    Days of Exercise per Week: 3 days    Minutes of Exercise per Session: 30 min  Stress: Stress Concern Present (06/05/2020)   [employer] of Occupational Health - Occupational Stress Questionnaire    Feeling of Stress : Very much  Social Connections: Moderately Isolated (06/05/2020)   Social Connection and Isolation Panel [NHANES]    Frequency of Communication with Friends and Family: Once a week    Frequency of Social Gatherings with Friends and Family: Once a week    Attends Religious Services: More than 4 times per year    Active Member of [employer] or Organizations: No    Attends Banker Meetings: Never    Marital Status: Married    Allergies: No Known Allergies  Metabolic Disorder Labs: Lab Results  Component Value Date   HGBA1C 5.5 05/10/2017   MPG 111 05/10/2017   MPG 108 02/20/2016   No results found for: "PROLACTIN" Lab Results  Component Value  Date   CHOL 107 12/21/2022   TRIG 93 12/21/2022   HDL 45 12/21/2022   CHOLHDL 2.4 12/21/2022   VLDL 34 (H) 06/30/2016   LDLCALC 44 12/21/2022   LDLCALC 79 07/16/2022   Lab Results  Component Value Date   TSH 3.210 07/16/2022   TSH 1.990 01/14/2022    Therapeutic Level Labs: No results found for: "LITHIUM" No results found for: "VALPROATE" No results found for: "CBMZ"  Current Medications: Current Outpatient Medications  Medication Sig Dispense Refill   methylphenidate (RITALIN) 10  MG tablet Take 1 tablet (10 mg total) by mouth 2 (two) times daily with breakfast and lunch. 60 tablet 0   amLODipine (NORVASC) 2.5 MG tablet TAKE 1 TABLET BY MOUTH DAILY  WITH AMLODIPINE 5MG  TABLET. 90 tablet 3   amLODipine (NORVASC) 5 MG tablet TAKE 1 TABLET BY MOUTH DAILY 90 tablet 3   aspirin 81 MG tablet Take 81 mg by mouth daily.     buPROPion (WELLBUTRIN XL) 300 MG 24 hr tablet Take 1 tablet (300 mg total) by mouth every morning. 30 tablet 2   clonazePAM (KLONOPIN) 0.5 MG tablet Take 1 tablet (0.5 mg total) by mouth 2 (two) times daily as needed. for anxiety 60 tablet 2   DULoxetine (CYMBALTA) 60 MG capsule Take 2 capsules (120 mg total) by mouth daily. 180 capsule 3   ezetimibe (ZETIA) 10 MG tablet Take 1 tablet (10 mg total) by mouth daily. 90 tablet 3   fenofibrate (TRICOR) 48 MG tablet Take 1 tablet (48 mg total) by mouth daily. 90 tablet 2   gabapentin (NEURONTIN) 600 MG tablet Take 1 tablet (600 mg total) by mouth 3 (three) times daily. 270 tablet 1   metaxalone (SKELAXIN) 800 MG tablet Take one tablet  by mouth at bedtime  for muscle spasm 90 tablet 1   methylphenidate (RITALIN) 10 MG tablet Take 1 tablet (10 mg total) by mouth 2 (two) times daily with breakfast and lunch. 60 tablet 0   methylphenidate (RITALIN) 10 MG tablet Take 1 tablet (10 mg total) by mouth 2 (two) times daily with breakfast and lunch. 60 tablet 0   mirabegron ER (MYRBETRIQ) 25 MG TB24 tablet Take 1 tablet (25 mg total)  by mouth daily. 90 tablet 1   Multiple Vitamin (MULTIVITAMIN WITH MINERALS) TABS tablet Take 1 tablet by mouth daily.     pantoprazole (PROTONIX) 40 MG tablet Take 1 tablet (40 mg total) by mouth 2 (two) times daily before a meal. 60 tablet 3   potassium chloride SA (KLOR-CON M) 20 MEQ tablet Take 1 tablet (20 mEq total) by mouth 2 (two) times daily. 180 tablet 2   prazosin (MINIPRESS) 2 MG capsule Take 1 capsule (2 mg total) by mouth at bedtime. 90 capsule 3   rosuvastatin (CRESTOR) 40 MG tablet Take 1 tablet (40 mg total) by mouth daily. 90 tablet 3   Vitamin D, Ergocalciferol, (DRISDOL) 1.25 MG (50000 UNIT) CAPS capsule Take 1 capsule (50,000 Units total) by mouth every 7 (seven) days. 5 capsule 8   No current facility-administered medications for this visit.   Facility-Administered Medications Ordered in Other Visits  Medication Dose Route Frequency Provider Last Rate Last Admin   gadopentetate dimeglumine (MAGNEVIST) injection 20 mL  20 mL Intravenous Once PRN Sater, Pearletha Furl, MD         Musculoskeletal: Strength & Muscle Tone: within normal limits Gait & Station: normal Patient leans: N/A  Psychiatric Specialty Exam: Review of Systems  Musculoskeletal:  Positive for arthralgias and joint swelling.  All other systems reviewed and are negative.   There were no vitals taken for this visit.There is no height or weight on file to calculate BMI.  General Appearance: Casual and Fairly Groomed  Eye Contact:  Good  Speech:  Clear and Coherent  Volume:  Normal  Mood:  Euthymic  Affect:  Congruent  Thought Process:  Goal Directed  Orientation:  Full (Time, Place, and Person)  Thought Content: WDL   Suicidal Thoughts:  No  Homicidal Thoughts:  No  Memory:  Immediate;   Good Recent;   Good Remote;   Good  Judgement:  Good  Insight:  Good  Psychomotor Activity:  Decreased  Concentration:  Concentration: Good and Attention Span: Good  Recall:  Good  Fund of Knowledge: Good   Language: Good  Akathisia:  No  Handed:  Right  AIMS (if indicated): not done  Assets:  Communication Skills Desire for Improvement Resilience Social Support Talents/Skills  ADL's:  Intact  Cognition: WNL  Sleep:  Good   Screenings: GAD-7    Flowsheet Row Office Visit from 12/22/2022 in [facility] Office Visit from 08/06/2022 in [facility] Office Visit from 07/16/2022 in [facility] Care Office Visit from 12/31/2021 in [facility] Office Visit from 06/05/2020 in [facility]  Total GAD-7 Score 10 7 8 6 14       PHQ2-9    Flowsheet Row Office Visit from 12/22/2022 in [facility] Office Visit from 08/06/2022 in [facility] Care Office Visit from 07/16/2022 in [facility] Care Office Visit from 01/14/2022 in [facility] Office Visit from 12/31/2021 in [facility]  PHQ-2 Total Score 2 3 4  0 0  PHQ-9 Total Score 12 13 13  -- --      Flowsheet Row Video Visit from 07/11/2021 in [facility] [place] Video Visit from 02/19/2021 in [facility] at [place] Video Visit from 01/23/2021 in [facility]patient [facility] at [place]  C-SSRS RISK CATEGORY No Risk No Risk No Risk        Assessment and Plan: This patient is a 62 year old female with history depression anxiety chronic fatigue and fibromyalgia.  For the most part she has been doing well on her current regimen.  She will continue Wellbutrin XL 300 mg daily as well as Cymbalta 60 mg twice daily for depression, prazosin 2 mg at bedtime to prevent nightmares, clonazepam 0.5 mg twice daily for anxiety and methylphenidate 10 mg twice daily for focus and alertness.  She will return to see me in 3 months  Collaboration of Care:  Collaboration of Care: Primary Care Provider AEB notes will be shared with PCP on the epic system  Patient/Guardian was advised Release of Information must be obtained prior to any record release in order to collaborate their care with an outside provider. Patient/Guardian was advised if they have not already done so to contact the registration department to sign all necessary forms in order for [place] to release information regarding their care.   Consent: Patient/Guardian gives verbal consent for treatment and assignment of benefits for services provided during this visit. Patient/Guardian expressed understanding and agreed to proceed.    Pamela Ruder, MD 12/24/2022, 2:29 PM

## 2022-12-31 ENCOUNTER — Encounter: Payer: BC Managed Care – PPO | Admitting: Internal Medicine

## 2022-12-31 ENCOUNTER — Ambulatory Visit (AMBULATORY_SURGERY_CENTER): Payer: BC Managed Care – PPO | Admitting: Internal Medicine

## 2022-12-31 ENCOUNTER — Encounter: Payer: Self-pay | Admitting: Internal Medicine

## 2022-12-31 VITALS — BP 101/62 | HR 80 | Temp 97.7°F | Resp 11 | Ht 62.0 in | Wt 168.0 lb

## 2022-12-31 DIAGNOSIS — K317 Polyp of stomach and duodenum: Secondary | ICD-10-CM

## 2022-12-31 DIAGNOSIS — K222 Esophageal obstruction: Secondary | ICD-10-CM | POA: Diagnosis not present

## 2022-12-31 DIAGNOSIS — K2970 Gastritis, unspecified, without bleeding: Secondary | ICD-10-CM

## 2022-12-31 DIAGNOSIS — K219 Gastro-esophageal reflux disease without esophagitis: Secondary | ICD-10-CM

## 2022-12-31 DIAGNOSIS — R1310 Dysphagia, unspecified: Secondary | ICD-10-CM | POA: Diagnosis not present

## 2022-12-31 DIAGNOSIS — S3630XA Unspecified injury of stomach, initial encounter: Secondary | ICD-10-CM | POA: Diagnosis not present

## 2022-12-31 DIAGNOSIS — K259 Gastric ulcer, unspecified as acute or chronic, without hemorrhage or perforation: Secondary | ICD-10-CM | POA: Diagnosis not present

## 2022-12-31 MED ORDER — SODIUM CHLORIDE 0.9 % IV SOLN: 500.0000 mL | Freq: Once | INTRAVENOUS | Status: DC

## 2022-12-31 NOTE — Progress Notes (Signed)
Uneventful anesthetic. Report to pacu rn. Vss. Care resumed by rn. 

## 2022-12-31 NOTE — Progress Notes (Signed)
Called to room to assist during endoscopic procedure.  Patient ID and intended procedure confirmed with present staff. Received instructions for my participation in the procedure from the performing physician.  

## 2022-12-31 NOTE — Patient Instructions (Signed)
Avoid NSAIDS: aspirin, aleve, ibuprofen whenever possible.  Continue your pantoprazole 1/2 hour before you eat.  It won't work on a full stomach.  Abide by your special diet for today. Read all of the handouts given to you by your recovery room nurse.    YOU HAD AN ENDOSCOPIC PROCEDURE TODAY AT THE [facility]:   Refer to the procedure report that was given to you for any specific questions about what was found during the examination.  If the procedure report does not answer your questions, please call your gastroenterologist to clarify.  If you requested that your care partner not be given the details of your procedure findings, then the procedure report has been included in a sealed envelope for you to review at your convenience later.  YOU SHOULD EXPECT: Some feelings of bloating in the abdomen. Passage of more gas than usual.   Please Note:  You might notice some irritation and congestion in your nose or some drainage.  This is from the oxygen used during your procedure.  There is no need for concern and it should clear up in a day or so.  SYMPTOMS TO REPORT IMMEDIATELY:   Following upper endoscopy (EGD)  Vomiting of blood or coffee ground material  New chest pain or pain under the shoulder blades  Painful or persistently difficult swallowing  New shortness of breath  Fever of 100F or higher  Black, tarry-looking stools  For urgent or emergent issues, a gastroenterologist can be reached at any hour by calling [phone]. Do not use MyChart messaging for urgent concerns.    DIET:  We do recommend nothing by mouth until  4:45 p m.  From 5:45 pm you may have clear liquids.  The rest of today you should be on a soft diet. You may have aregular diet tomorrrow, but read your handout regarding acidic foods and spicy foods.  They should be avoided until your stomach is better.  ACTIVITY:  You should plan to take it easy for the rest of today and you should NOT DRIVE or use  heavy machinery until tomorrow (because of the sedation medicines used during the test).    FOLLOW UP: Our staff will call the number listed on your records the next business day following your procedure.  We will call around 7:15- 8:00 am to check on you and address any questions or concerns that you may have regarding the information given to you following your procedure. If we do not reach you, we will leave a message.     If any biopsies were taken you will be contacted by phone or by letter within the next 1-3 weeks.  Please call us at [phone] if you have not heard about the biopsies in 3 weeks.    SIGNATURES/CONFIDENTIALITY: You and/or your care partner have signed paperwork which will be entered into your electronic medical record.  These signatures attest to the fact that that the information above on your After Visit Summary has been reviewed and is understood.  Full responsibility of the confidentiality of this discharge information lies with you and/or your care-partner.

## 2022-12-31 NOTE — Progress Notes (Signed)
GASTROENTEROLOGY PROCEDURE H&P NOTE   Primary Care Physician: Kerri Perches, MD    Reason for Procedure:   GERD and dyphagia  Plan:    EGD with dilation  Patient is appropriate for endoscopic procedure(s) in the ambulatory ([facility]) setting.  The nature of the procedure, as well as the risks, benefits, and alternatives were carefully and thoroughly reviewed with the patient. Ample time for discussion and questions allowed. The patient understood, was satisfied, and agreed to proceed.     HPI: Pamela Walters is a 62 y.o. female who presents for EGD.  Medical history as below.  No recent chest pain or shortness of breath.  No abdominal pain today.  Past Medical History:  Diagnosis Date   Allergy    Anemia    Anxiety    Arthritis    Back pain    Bell's palsy 08/2017   Depression    Depression    Phreesia 05/19/2020   Diverticulosis    GERD (gastroesophageal reflux disease)    Hepatic steatosis    Hiatal hernia    History of Bell's palsy 02/11/2018   Hyperlipidemia    Hypertension    Internal hemorrhoids    Internal hemorrhoids    MS (multiple sclerosis) (HCC)    2007   Multiple sclerosis (HCC) 04/04/2008   Qualifier: Diagnosis of  By: Lodema Hong MD, Margaret     Schatzki's ring    Sleep apnea    wears CPAP    Past Surgical History:  Procedure Laterality Date   ABDOMINAL HYSTERECTOMY  2005   fibroids   CARDIAC CATHETERIZATION N/A 05/04/2016   Procedure: Left Heart Cath and Coronary Angiography;  Surgeon: Corky Crafts, MD;  Location: [facility] INVASIVE CV LAB;  Service: Cardiovascular;  Laterality: N/A;   COLONOSCOPY     ESOPHAGOGASTRODUODENOSCOPY N/A 09/18/2013   Procedure: ESOPHAGOGASTRODUODENOSCOPY (EGD);  Surgeon: Beverley Fiedler, MD;  Location: [facility] ENDOSCOPY;  Service: Gastroenterology;  Laterality: N/A;   REPLACEMENT TOTAL KNEE Left 03/11/2022   UPPER GASTROINTESTINAL ENDOSCOPY     WISDOM TOOTH EXTRACTION      Prior to Admission medications    Medication Sig Start Date End Date Taking? Authorizing Provider  amLODipine (NORVASC) 5 MG tablet TAKE 1 TABLET BY MOUTH DAILY Patient taking differently: Take 10 mg by mouth daily. 03/18/22  Yes Kerri Perches, MD  buPROPion (WELLBUTRIN XL) 300 MG 24 hr tablet Take 1 tablet (300 mg total) by mouth every morning. 12/24/22 12/24/23 Yes Myrlene Broker, MD  clonazePAM (KLONOPIN) 0.5 MG tablet Take 1 tablet (0.5 mg total) by mouth 2 (two) times daily as needed. for anxiety 12/24/22  Yes Myrlene Broker, MD  DULoxetine (CYMBALTA) 60 MG capsule Take 2 capsules (120 mg total) by mouth daily. 12/24/22  Yes Myrlene Broker, MD  ezetimibe (ZETIA) 10 MG tablet Take 1 tablet (10 mg total) by mouth daily. 08/06/22  Yes Kerri Perches, MD  fenofibrate (TRICOR) 48 MG tablet Take 1 tablet (48 mg total) by mouth daily. 12/22/22  Yes Kerri Perches, MD  gabapentin (NEURONTIN) 600 MG tablet Take 1 tablet (600 mg total) by mouth 3 (three) times daily. 12/22/22  Yes Kerri Perches, MD  metaxalone [facility]) 800 MG tablet Take one tablet  by mouth at bedtime  for muscle spasm 12/22/22  Yes Kerri Perches, MD  methylphenidate (RITALIN) 10 MG tablet Take 1 tablet (10 mg total) by mouth 2 (two) times daily with breakfast and lunch. 12/24/22 12/24/23 Yes Tenny Craw,  Mellody Drown, MD  mirabegron ER (MYRBETRIQ) 25 MG TB24 tablet Take 1 tablet (25 mg total) by mouth daily. 12/22/22  Yes Kerri Perches, MD  Multiple Vitamin (MULTIVITAMIN WITH MINERALS) TABS tablet Take 1 tablet by mouth daily.   Yes [provider]  pantoprazole (PROTONIX) 40 MG tablet Take 1 tablet (40 mg total) by mouth 2 (two) times daily before a meal. 07/28/22  Yes Arnav Cregg, Carie Caddy, MD  potassium chloride SA (KLOR-CON M) 20 MEQ tablet Take 1 tablet (20 mEq total) by mouth 2 (two) times daily. 12/22/22  Yes Kerri Perches, MD  prazosin (MINIPRESS) 2 MG capsule Take 1 capsule (2 mg total) by mouth at bedtime. 12/24/22  Yes Myrlene Broker,  MD  rosuvastatin (CRESTOR) 40 MG tablet Take 1 tablet (40 mg total) by mouth daily. 08/06/22  Yes Kerri Perches, MD  Vitamin D, Ergocalciferol, (DRISDOL) 1.25 MG (50000 UNIT) CAPS capsule Take 1 capsule (50,000 Units total) by mouth every 7 (seven) days. 08/06/22  Yes Kerri Perches, MD  aspirin 81 MG tablet Take 81 mg by mouth daily.    [provider]    Current Outpatient Medications  Medication Sig Dispense Refill   amLODipine (NORVASC) 5 MG tablet TAKE 1 TABLET BY MOUTH DAILY (Patient taking differently: Take 10 mg by mouth daily.) 90 tablet 3   buPROPion (WELLBUTRIN XL) 300 MG 24 hr tablet Take 1 tablet (300 mg total) by mouth every morning. 30 tablet 2   clonazePAM (KLONOPIN) 0.5 MG tablet Take 1 tablet (0.5 mg total) by mouth 2 (two) times daily as needed. for anxiety 60 tablet 2   DULoxetine (CYMBALTA) 60 MG capsule Take 2 capsules (120 mg total) by mouth daily. 180 capsule 3   ezetimibe (ZETIA) 10 MG tablet Take 1 tablet (10 mg total) by mouth daily. 90 tablet 3   fenofibrate (TRICOR) 48 MG tablet Take 1 tablet (48 mg total) by mouth daily. 90 tablet 2   gabapentin (NEURONTIN) 600 MG tablet Take 1 tablet (600 mg total) by mouth 3 (three) times daily. 270 tablet 1   metaxalone (SKELAXIN) 800 MG tablet Take one tablet  by mouth at bedtime  for muscle spasm 90 tablet 1   methylphenidate (RITALIN) 10 MG tablet Take 1 tablet (10 mg total) by mouth 2 (two) times daily with breakfast and lunch. 60 tablet 0   mirabegron ER (MYRBETRIQ) 25 MG TB24 tablet Take 1 tablet (25 mg total) by mouth daily. 90 tablet 1   Multiple Vitamin (MULTIVITAMIN WITH MINERALS) TABS tablet Take 1 tablet by mouth daily.     pantoprazole (PROTONIX) 40 MG tablet Take 1 tablet (40 mg total) by mouth 2 (two) times daily before a meal. 60 tablet 3   potassium chloride SA (KLOR-CON M) 20 MEQ tablet Take 1 tablet (20 mEq total) by mouth 2 (two) times daily. 180 tablet 2   prazosin (MINIPRESS) 2 MG capsule  Take 1 capsule (2 mg total) by mouth at bedtime. 90 capsule 3   rosuvastatin (CRESTOR) 40 MG tablet Take 1 tablet (40 mg total) by mouth daily. 90 tablet 3   Vitamin D, Ergocalciferol, (DRISDOL) 1.25 MG (50000 UNIT) CAPS capsule Take 1 capsule (50,000 Units total) by mouth every 7 (seven) days. 5 capsule 8   aspirin 81 MG tablet Take 81 mg by mouth daily.     Current Facility-Administered Medications  Medication Dose Route Frequency Provider Last Rate Last Admin   0.9 %  sodium chloride infusion  500 mL Intravenous Once Amee Boothe, Carie Caddy, MD       Facility-Administered Medications Ordered in Other Visits  Medication Dose Route Frequency Provider Last Rate Last Admin   gadopentetate dimeglumine (MAGNEVIST) injection 20 mL  20 mL Intravenous Once PRN Sater, Pearletha Furl, MD        Allergies as of 12/31/2022   (No Known Allergies)    Family History  Problem Relation Age of Onset   Hypertension Mother    Hyperlipidemia Mother    Depression Mother    Hypertension Father    Hypertension Sister    High Cholesterol Sister    Healthy Son    Healthy Daughter    Colon cancer Neg Hx    Esophageal cancer Neg Hx    Rectal cancer Neg Hx    Stomach cancer Neg Hx    Colon polyps Neg Hx     Social History   Socioeconomic History   Marital status: Married    Spouse name: Not on file   Number of children: 2   Years of education: Not on file   Highest education level: Not on file  Occupational History   Occupation: retired  Tobacco Use   Smoking status: Never   Smokeless tobacco: Never  Vaping Use   Vaping status: Never Used  Substance and Sexual Activity   Alcohol use: No   Drug use: No   Sexual activity: Yes    Birth control/protection: Surgical    Comment: hyst  Other Topics Concern   Not on file  Social History Narrative   Not on file   Social Determinants of Health   Financial Resource Strain: Low Risk  (06/05/2020)   Overall Financial Resource Strain (CARDIA)    Difficulty of  Paying Living Expenses: Not very hard  Food Insecurity: No Food Insecurity (06/05/2020)   Hunger Vital Sign    Worried About Running Out of Food in the Last Year: Never true    Ran Out of Food in the Last Year: Never true  Transportation Needs: No Transportation Needs (06/05/2020)   PRAPARE - Administrator, Civil Service (Medical): No    Lack of Transportation (Non-Medical): No  Physical Activity: Insufficiently Active (06/05/2020)   Exercise Vital Sign    Days of Exercise per Week: 3 days    Minutes of Exercise per Session: 30 min  Stress: Stress Concern Present (06/05/2020)   [employer] of Occupational Health - Occupational Stress Questionnaire    Feeling of Stress : Very much  Social Connections: Moderately Isolated (06/05/2020)   Social Connection and Isolation Panel [NHANES]    Frequency of Communication with Friends and Family: Once a week    Frequency of Social Gatherings with Friends and Family: Once a week    Attends Religious Services: More than 4 times per year    Active Member of [employer] or Organizations: No    Attends Banker Meetings: Never    Marital Status: Married  Catering manager Violence: Not At Risk (06/05/2020)   Humiliation, Afraid, Rape, and Kick questionnaire    Fear of Current or Ex-Partner: No    Emotionally Abused: No    Physically Abused: No    Sexually Abused: No    Physical Exam: Vital signs in last 24 hours: @BP  127/78   Pulse 76   Temp 97.7 F (36.5 C)   Ht 5\' 2"  (1.575 m)   Wt 168 lb (76.2 kg)   SpO2 98%   BMI 30.73 kg/m  GEN: NAD EYE: Sclerae anicteric ENT: MMM CV: Non-tachycardic Pulm: CTA b/l GI: Soft, NT/ND NEURO:  Alert & Oriented x 3   Erick Blinks, MD [facility]  12/31/2022 3:03 PM

## 2022-12-31 NOTE — Op Note (Signed)
[facility] Patient Name: Pamela Walters Procedure Date: 12/31/2022 3:04 PM MRN: [id number] Endoscopist: Beverley Fiedler , MD, [id number] Age: 62 Referring MD:  Date of Birth: 12/26/1960 Gender: Female Account #: [id number] Procedure:                Upper GI endoscopy Indications:              Dysphagia, Gastro-esophageal reflux disease, pt                            reports heartburn despite pantoprazole 40 mg BID;                            last EGD with dilation to 18 mm in June 2021 Medicines:                Monitored Anesthesia Care Procedure:                Pre-Anesthesia Assessment:                           - Prior to the procedure, a History and Physical                            was performed, and patient medications and                            allergies were reviewed. The patient's tolerance of                            previous anesthesia was also reviewed. The risks                            and benefits of the procedure and the sedation                            options and risks were discussed with the patient.                            All questions were answered, and informed consent                            was obtained. Prior Anticoagulants: The patient has                            taken no anticoagulant or antiplatelet agents. ASA                            Grade Assessment: III - A patient with severe                            systemic disease. After reviewing the risks and                            benefits, the patient was deemed in satisfactory  condition to undergo the procedure.                           After obtaining informed consent, the endoscope was                            passed under direct vision. Throughout the                            procedure, the patient's blood pressure, pulse, and                            oxygen saturations were monitored continuously. The                            GIF  [id number] #[id number] was introduced through the                            mouth, and advanced to the second part of duodenum.                            The upper GI endoscopy was accomplished without                            difficulty. The patient tolerated the procedure                            well. Scope In: Scope Out: Findings:                 Two benign-appearing, intrinsic moderate                            (circumferential scarring or stenosis; an endoscope                            may pass) stenoses were found 38 to 39 cm from the                            incisors. The narrowest stenosis measured 1.3 cm                            (inner diameter) x 1 cm (in length). The stenoses                            were traversed. A TTS dilator was passed through                            the scope. Dilation with a 16-17-18 mm balloon                            dilator was performed to 16 mm and 17 mm. The  dilation site was examined and showed moderate                            mucosal disruption.                           Severe inflammation characterized by congestion                            (edema), erythema, granularity and shallow                            ulcerations was found in the gastric antrum and in                            the prepyloric region of the stomach. Biopsies were                            taken with a cold forceps for histology and                            Helicobacter pylori testing.                           Multiple small sessile polyps were found in the                            gastric fundus and in the gastric body. These were                            consistent with benign fundic gland polyps.                            Additional biopsies were taken with a cold forceps                            for histology from the gastric body and fundus to                            exclude H. Pylori in the setting of  chronic PPI.                           Diffuse mildly erythematous mucosa without active                            bleeding was found in the duodenal bulb.                           The second portion of the duodenum was normal. Complications:            No immediate complications. Estimated Blood Loss:     Estimated blood loss was minimal. Impression:               - Benign-appearing esophageal stenoses. Dilated to  17 mm with balloon.                           - Acute ulcerative gastritis. Biopsied.                           - Multiple benign gastric polyps. Biopsied.                           - Erythematous duodenitis in the bulb without                            ulceration.                           - Normal second portion of the duodenum. Recommendation:           - Patient has a contact number available for                            emergencies. The signs and symptoms of potential                            delayed complications were discussed with the                            patient. Return to normal activities tomorrow.                            Written discharge instructions were provided to the                            patient.                           - Post-dilation diet and then advance diet as                            tolerated.                           - Continue present medications. Avoid NSAIDs.                           - Continue pantoprazole 40 mg twice daily (may need                            to consider change in PPI or change to vonaprazan                            if H Pylori negative and not frequent NSAID use).                           - Await pathology results.                           -  Office follow-up with me. Beverley Fiedler, MD 12/31/2022 3:33:57 PM This report has been signed electronically.

## 2023-01-01 ENCOUNTER — Telehealth: Payer: Self-pay

## 2023-01-01 NOTE — Telephone Encounter (Signed)
  Follow up Call-     12/31/2022    2:40 PM  Call back number  Post procedure Call Back phone  # [phone]  Permission to leave phone message Yes     Patient questions:  Do you have a fever, pain , or abdominal swelling? No. Pain Score  0 *  Have you tolerated food without any problems? Yes.    Have you been able to return to your normal activities? Yes.    Do you have any questions about your discharge instructions: Diet   No. Medications  No. Follow up visit  No.  Do you have questions or concerns about your Care? No.  Actions: * If pain score is 4 or above: No action needed, pain <4.

## 2023-01-03 DIAGNOSIS — Z23 Encounter for immunization: Secondary | ICD-10-CM | POA: Insufficient documentation

## 2023-01-03 NOTE — Assessment & Plan Note (Signed)
Cleared pending improvement of bP. Amlodipine doubled to 10 mg daily, re eval in 2 weeks, if at goal then cleared medically EKG: No acute ischemia , sinus rhythm Basic labs and CXR obtained and are within normal

## 2023-01-03 NOTE — Assessment & Plan Note (Signed)
After obtaining informed consent, the influenza vaccine is  administered , with no adverse effect noted at the time of administration.

## 2023-01-03 NOTE — Assessment & Plan Note (Signed)
Uncontrolled , increase dose of amlodipine to 10 mg daily and re eval in 2 weeks, if within normal then will be medically cleared for surgery

## 2023-01-05 ENCOUNTER — Ambulatory Visit: Payer: BC Managed Care – PPO

## 2023-01-05 ENCOUNTER — Encounter: Payer: Self-pay | Admitting: Internal Medicine

## 2023-01-05 MED ORDER — AMLODIPINE BESYLATE 10 MG PO TABS: 10.0000 mg | ORAL_TABLET | Freq: Every day | ORAL | 3 refills | Status: DC

## 2023-01-05 NOTE — Addendum Note (Signed)
Addended by: Syliva Overman E on: 01/05/2023 12:12 PM   Modules accepted: Orders

## 2023-01-05 NOTE — Progress Notes (Signed)
Blood pressure controlled on amlodipine 10 mg daily, will send in script and she is medicaly cleared for surgery, Nurse to fax clearance form and  Let her know sent to [facility],

## 2023-01-07 ENCOUNTER — Other Ambulatory Visit: Payer: Self-pay

## 2023-01-07 MED ORDER — FAMOTIDINE 20 MG PO TABS: 20.0000 mg | ORAL_TABLET | Freq: Every day | ORAL | 3 refills | Status: AC

## 2023-01-07 MED ORDER — DEXLANSOPRAZOLE 60 MG PO CPDR: 60.0000 mg | DELAYED_RELEASE_CAPSULE | Freq: Every day | ORAL | 3 refills | Status: AC

## 2023-01-20 DIAGNOSIS — M25761 Osteophyte, right knee: Secondary | ICD-10-CM | POA: Diagnosis not present

## 2023-01-20 DIAGNOSIS — M1711 Unilateral primary osteoarthritis, right knee: Secondary | ICD-10-CM | POA: Diagnosis not present

## 2023-02-01 ENCOUNTER — Telehealth: Payer: BC Managed Care – PPO

## 2023-03-29 ENCOUNTER — Telehealth (HOSPITAL_COMMUNITY): Payer: BC Managed Care – PPO | Admitting: Psychiatry

## 2023-03-29 ENCOUNTER — Encounter (HOSPITAL_COMMUNITY): Payer: Self-pay | Admitting: Psychiatry

## 2023-03-29 DIAGNOSIS — M797 Fibromyalgia: Secondary | ICD-10-CM | POA: Diagnosis not present

## 2023-03-29 DIAGNOSIS — F331 Major depressive disorder, recurrent, moderate: Secondary | ICD-10-CM

## 2023-03-29 DIAGNOSIS — F419 Anxiety disorder, unspecified: Secondary | ICD-10-CM

## 2023-03-29 MED ORDER — METHYLPHENIDATE HCL 10 MG PO TABS: 10.0000 mg | ORAL_TABLET | Freq: Two times a day (BID) | ORAL | 0 refills | Status: DC

## 2023-03-29 MED ORDER — BUPROPION HCL ER (XL) 300 MG PO TB24: 300.0000 mg | ORAL_TABLET | ORAL | 2 refills | Status: DC

## 2023-03-29 MED ORDER — DULOXETINE HCL 60 MG PO CPEP: 120.0000 mg | ORAL_CAPSULE | Freq: Every day | ORAL | 3 refills | Status: DC

## 2023-03-29 MED ORDER — PRAZOSIN HCL 2 MG PO CAPS: 2.0000 mg | ORAL_CAPSULE | Freq: Every day | ORAL | 3 refills | Status: DC

## 2023-03-29 MED ORDER — CLONAZEPAM 0.5 MG PO TABS: 0.5000 mg | ORAL_TABLET | Freq: Two times a day (BID) | ORAL | 2 refills | Status: DC | PRN

## 2023-03-29 NOTE — Progress Notes (Signed)
Virtual Visit via Video Note  I connected with Pamela Walters on 03/29/23 at  1:40 PM EST by a video enabled telemedicine application and verified that I am speaking with the correct person using two identifiers.  Location: Patient: home Provider: office   I discussed the limitations of evaluation and management by telemedicine and the availability of in person appointments. The patient expressed understanding and agreed to proceed.      I discussed the assessment and treatment plan with the patient. The patient was provided an opportunity to ask questions and all were answered. The patient agreed with the plan and demonstrated an understanding of the instructions.   The patient was advised to call back or seek an in-person evaluation if the symptoms worsen or if the condition fails to improve as anticipated.  I provided 20 minutes of non-face-to-face time during this encounter.   [name], MD  [facility] MD/PA/NP OP Progress Note  03/29/2023 1:45 PM Pamela Walters  MRN:  [id number]  Chief Complaint:  Chief Complaint  Patient presents with   Anxiety   Depression   Follow-up   HPI: This patient is a 62 year old married white female who lives with her husband in [place].  She is a retired Public house manager.  The patient returns for follow-up after 3 months.  She had her second knee replacement this time on the right knee in September.  She states at first she felt frustrated and depressed because it was hurting a lot and taking longer to heal than the first 1 but now she is feeling much better and is moving forward.  She has been released to normal activities.  She states that her mood is generally been good.  She denies does significant depression anxiety thoughts of self-harm or suicide.  She is sleeping much better.  She denies nightmares and is sleeping well with the terazosin.  The Ritalin continues to help her with energy and focus Visit Diagnosis:    ICD-10-CM   1. Major depressive  disorder, recurrent episode, moderate (HCC)  F33.1       Past Psychiatric History: none  Past Medical History:  Past Medical History:  Diagnosis Date   Allergy    Anemia    Anxiety    Arthritis    Back pain    Bell's palsy 08/2017   Depression    Depression    Phreesia 05/19/2020   Diverticulosis    GERD (gastroesophageal reflux disease)    Hepatic steatosis    Hiatal hernia    History of Bell's palsy 02/11/2018   Hyperlipidemia    Hypertension    Internal hemorrhoids    Internal hemorrhoids    MS (multiple sclerosis) (HCC)    2007   Multiple sclerosis (HCC) 04/04/2008   Qualifier: Diagnosis of  By: Lodema Hong MD, Margaret     Schatzki's ring    Sleep apnea    wears CPAP    Past Surgical History:  Procedure Laterality Date   ABDOMINAL HYSTERECTOMY  2005   fibroids   CARDIAC CATHETERIZATION N/A 05/04/2016   Procedure: Left Heart Cath and Coronary Angiography;  Surgeon: Corky Crafts, MD;  Location: [facility] INVASIVE CV LAB;  Service: Cardiovascular;  Laterality: N/A;   COLONOSCOPY     ESOPHAGOGASTRODUODENOSCOPY N/A 09/18/2013   Procedure: ESOPHAGOGASTRODUODENOSCOPY (EGD);  Surgeon: Beverley Fiedler, MD;  Location: [facility] ENDOSCOPY;  Service: Gastroenterology;  Laterality: N/A;   REPLACEMENT TOTAL KNEE Left 03/11/2022   UPPER GASTROINTESTINAL ENDOSCOPY     WISDOM TOOTH  EXTRACTION      Family Psychiatric History: See below  Family History:  Family History  Problem Relation Age of Onset   Hypertension Mother    Hyperlipidemia Mother    Depression Mother    Hypertension Father    Hypertension Sister    High Cholesterol Sister    Healthy Son    Healthy Daughter    Colon cancer Neg Hx    Esophageal cancer Neg Hx    Rectal cancer Neg Hx    Stomach cancer Neg Hx    Colon polyps Neg Hx     Social History:  Social History   Socioeconomic History   Marital status: Married    Spouse name: Not on file   Number of children: 2   Years of education: Not on file    Highest education level: Not on file  Occupational History   Occupation: retired  Tobacco Use   Smoking status: Never   Smokeless tobacco: Never  Vaping Use   Vaping status: Never Used  Substance and Sexual Activity   Alcohol use: No   Drug use: No   Sexual activity: Yes    Birth control/protection: Surgical    Comment: hyst  Other Topics Concern   Not on file  Social History Narrative   Not on file   Social Determinants of Health   Financial Resource Strain: Low Risk  (06/05/2020)   Overall Financial Resource Strain (CARDIA)    Difficulty of Paying Living Expenses: Not very hard  Food Insecurity: No Food Insecurity (06/05/2020)   Hunger Vital Sign    Worried About Running Out of Food in the Last Year: Never true    Ran Out of Food in the Last Year: Never true  Transportation Needs: No Transportation Needs (06/05/2020)   PRAPARE - Administrator, Civil Service (Medical): No    Lack of Transportation (Non-Medical): No  Physical Activity: Insufficiently Active (06/05/2020)   Exercise Vital Sign    Days of Exercise per Week: 3 days    Minutes of Exercise per Session: 30 min  Stress: Stress Concern Present (06/05/2020)   [employer] of Occupational Health - Occupational Stress Questionnaire    Feeling of Stress : Very much  Social Connections: Moderately Isolated (06/05/2020)   Social Connection and Isolation Panel [NHANES]    Frequency of Communication with Friends and Family: Once a week    Frequency of Social Gatherings with Friends and Family: Once a week    Attends Religious Services: More than 4 times per year    Active Member of [employer] or Organizations: No    Attends Banker Meetings: Never    Marital Status: Married    Allergies: No Known Allergies  Metabolic Disorder Labs: Lab Results  Component Value Date   HGBA1C 5.5 05/10/2017   MPG 111 05/10/2017   MPG 108 02/20/2016   No results found for: "PROLACTIN" Lab Results  Component Value  Date   CHOL 107 12/21/2022   TRIG 93 12/21/2022   HDL 45 12/21/2022   CHOLHDL 2.4 12/21/2022   VLDL 34 (H) 06/30/2016   LDLCALC 44 12/21/2022   LDLCALC 79 07/16/2022   Lab Results  Component Value Date   TSH 3.210 07/16/2022   TSH 1.990 01/14/2022    Therapeutic Level Labs: No results found for: "LITHIUM" No results found for: "VALPROATE" No results found for: "CBMZ"  Current Medications: Current Outpatient Medications  Medication Sig Dispense Refill   methylphenidate (RITALIN) 10 MG tablet  Take 1 tablet (10 mg total) by mouth 2 (two) times daily with breakfast and lunch. 60 tablet 0   methylphenidate (RITALIN) 10 MG tablet Take 1 tablet (10 mg total) by mouth 2 (two) times daily with breakfast and lunch. 60 tablet 0   amLODipine (NORVASC) 10 MG tablet Take 1 tablet (10 mg total) by mouth daily. 90 tablet 3   aspirin 81 MG tablet Take 81 mg by mouth daily.     buPROPion (WELLBUTRIN XL) 300 MG 24 hr tablet Take 1 tablet (300 mg total) by mouth every morning. 30 tablet 2   clonazePAM (KLONOPIN) 0.5 MG tablet Take 1 tablet (0.5 mg total) by mouth 2 (two) times daily as needed. for anxiety 60 tablet 2   dexlansoprazole (DEXILANT) 60 MG capsule Take 1 capsule (60 mg total) by mouth daily. 90 capsule 3   DULoxetine (CYMBALTA) 60 MG capsule Take 2 capsules (120 mg total) by mouth daily. 180 capsule 3   ezetimibe (ZETIA) 10 MG tablet Take 1 tablet (10 mg total) by mouth daily. 90 tablet 3   famotidine (PEPCID) 20 MG tablet Take 1 tablet (20 mg total) by mouth at bedtime. 90 tablet 3   fenofibrate (TRICOR) 48 MG tablet Take 1 tablet (48 mg total) by mouth daily. 90 tablet 2   gabapentin (NEURONTIN) 600 MG tablet Take 1 tablet (600 mg total) by mouth 3 (three) times daily. 270 tablet 1   metaxalone (SKELAXIN) 800 MG tablet Take one tablet  by mouth at bedtime  for muscle spasm 90 tablet 1   methylphenidate (RITALIN) 10 MG tablet Take 1 tablet (10 mg total) by mouth 2 (two) times daily with  breakfast and lunch. 60 tablet 0   mirabegron ER (MYRBETRIQ) 25 MG TB24 tablet Take 1 tablet (25 mg total) by mouth daily. 90 tablet 1   Multiple Vitamin (MULTIVITAMIN WITH MINERALS) TABS tablet Take 1 tablet by mouth daily.     pantoprazole (PROTONIX) 40 MG tablet Take 1 tablet (40 mg total) by mouth 2 (two) times daily before a meal. 60 tablet 3   potassium chloride SA (KLOR-CON M) 20 MEQ tablet Take 1 tablet (20 mEq total) by mouth 2 (two) times daily. 180 tablet 2   prazosin (MINIPRESS) 2 MG capsule Take 1 capsule (2 mg total) by mouth at bedtime. 90 capsule 3   rosuvastatin (CRESTOR) 40 MG tablet Take 1 tablet (40 mg total) by mouth daily. 90 tablet 3   Vitamin D, Ergocalciferol, (DRISDOL) 1.25 MG (50000 UNIT) CAPS capsule Take 1 capsule (50,000 Units total) by mouth every 7 (seven) days. 5 capsule 8   No current facility-administered medications for this visit.   Facility-Administered Medications Ordered in Other Visits  Medication Dose Route Frequency Provider Last Rate Last Admin   gadopentetate dimeglumine (MAGNEVIST) injection 20 mL  20 mL Intravenous Once PRN Sater, Pearletha Furl, MD         Musculoskeletal: Strength & Muscle Tone: within normal limits Gait & Station: normal Patient leans: N/A  Psychiatric Specialty Exam: Review of Systems  Musculoskeletal:  Positive for arthralgias and joint swelling.  All other systems reviewed and are negative.   There were no vitals taken for this visit.There is no height or weight on file to calculate BMI.  General Appearance: Casual and Fairly Groomed  Eye Contact:  Good  Speech:  Clear and Coherent  Volume:  Normal  Mood:  Euthymic  Affect:  Congruent  Thought Process:  Goal Directed  Orientation:  Full (  Time, Place, and Person)  Thought Content: WDL   Suicidal Thoughts:  No  Homicidal Thoughts:  No  Memory:  Immediate;   Good Recent;   Good Remote;   NA  Judgement:  Good  Insight:  Good  Psychomotor Activity:  Normal   Concentration:  Concentration: Good and Attention Span: Good  Recall:  Good  Fund of Knowledge: Good  Language: Good  Akathisia:  No  Handed:  Right  AIMS (if indicated): not done  Assets:  Communication Skills Desire for Improvement Resilience Social Support Talents/Skills  ADL's:  Intact  Cognition: WNL  Sleep:  Good   Screenings: GAD-7    Flowsheet Row Office Visit from 12/22/2022 in [facility] Office Visit from 08/06/2022 in [facility] Office Visit from 07/16/2022 in [facility] Office Visit from 12/31/2021 in [facility] Office Visit from 06/05/2020 in [facility]  Total GAD-7 Score 10 7 8 6 14       PHQ2-9    Flowsheet Row Office Visit from 12/22/2022 in [facility] Office Visit from 08/06/2022 in [facility] Office Visit from 07/16/2022 in [facility] Office Visit from 01/14/2022 in [facility] Office Visit from 12/31/2021 in [facility]  PHQ-2 Total Score 2 3 4  0 0  PHQ-9 Total Score 12 13 13  -- --      Flowsheet Row Video Visit from 07/11/2021 in [facility] [place] Video Visit from 02/19/2021 in [facility] at [place] Video Visit from 01/23/2021 in [facility] at [place]  C-SSRS RISK CATEGORY No Risk No Risk No Risk        Assessment and Plan: This patient is a 62 year old female with a history of depression anxiety chronic fatigue and fibromyalgia.  She continues to do very well on her current regimen.  She will continue Wellbutrin XL 300 mg daily as well as Cymbalta 60 mg twice daily for depression, prazosin 2 mg at bedtime to prevent nightmares, clonazepam 0.5 mg twice daily for anxiety and methylphenidate 10 mg twice daily  for focus and alertness.  She will return to see me in 3 months  Collaboration of Care: Collaboration of Care: Primary Care Provider AEB notes are shared with PCP on the epic system  Patient/Guardian was advised Release of Information must be obtained prior to any record release in order to collaborate their care with an outside provider. Patient/Guardian was advised if they have not already done so to contact the registration department to sign all necessary forms in order for [place] to release information regarding their care.   Consent: Patient/Guardian gives verbal consent for treatment and assignment of benefits for services provided during this visit. Patient/Guardian expressed understanding and agreed to proceed.    [name], MD 03/29/2023, 1:45 PM

## 2023-05-04 ENCOUNTER — Ambulatory Visit: Payer: BC Managed Care – PPO | Admitting: Internal Medicine

## 2023-05-10 ENCOUNTER — Ambulatory Visit: Payer: BC Managed Care – PPO | Admitting: Physician Assistant

## 2023-05-10 NOTE — Progress Notes (Deleted)
 05/10/2023 BREKYN HUNTOON [id number] 1961/02/07  Referring provider: Antonetta Rollene BRAVO, MD Primary GI doctor: Dr. Albertus  ASSESSMENT AND PLAN:   Assessment and Plan              Patient Care Team: Antonetta Rollene BRAVO, MD as PCP - General (Family Medicine) Burnard Debby LABOR, MD as PCP - Cardiology (Cardiology)  HISTORY OF PRESENT ILLNESS: 63 y.o. female with a past medical history of hypertension, OSA, hemorrhoids, osteopenia, vitamin D  deficiency, urinary incontinence status post hysterectomy, constipation, OSA and others listed below presents for evaluation of dysphagia and GERD.   EGD 01/15/2023 for GERD despite PPI dilated 17 mm had acute ulcerative gastritis gastric polyps biopsies negative H. pylori no abnormal cells.  Started on Dexilant  once daily with Pepcid  at night.  Presents for follow-up.  Discussed the use of AI scribe software for clinical note transcription with the patient, who gave verbal consent to proceed.  History of Present Illness           She  reports that she has never smoked. She has never used smokeless tobacco. She reports that she does not drink alcohol and does not use drugs.  12/31/2022 EGD for GERD despite PPI use - Benign- appearing esophageal stenoses. Dilated to 17 mm with balloon. - Acute ulcerative gastritis. Biopsied. - Multiple benign gastric polyps. Biopsied. - Erythematous duodenitis in the bulb without ulceration. - Normal second portion of the duodenum. Stomach biopsies negative H. pylori no abnormal cells started Dexilant  60 mg once daily and famotidine  in the evening.  10/15/2019 EGD dilation 18 mm  08/14/2019 colonoscopy for personal history of nonadvanced adenomatous and sessile serrated polyps - One 4 mm polyp at the recto- sigmoid colon, removed with a cold snare. Resected and retrieved. - Diverticulosis in the sigmoid colon and in the descending colon. - Small internal hemorrhoids. surveillance colonoscopy April  2026  RELEVANT LABS AND IMAGING:  Results          CBC    Component Value Date/Time   WBC 6.7 12/22/2022 1309   WBC 7.1 08/10/2019 1403   RBC 4.45 12/22/2022 1309   RBC 4.80 08/10/2019 1403   HGB 11.6 12/22/2022 1309   HCT 36.6 12/22/2022 1309   PLT 333 12/22/2022 1309   MCV 82 12/22/2022 1309   MCH 26.1 (L) 12/22/2022 1309   MCH 26.3 (L) 08/10/2019 1403   MCHC 31.7 12/22/2022 1309   MCHC 32.2 08/10/2019 1403   RDW 15.7 (H) 12/22/2022 1309   LYMPHSABS 1.0 11/10/2017 1229   MONOABS 0.6 11/10/2017 1229   EOSABS 0.0 11/10/2017 1229   BASOSABS 0.0 11/10/2017 1229   Recent Labs    07/16/22 1408 12/22/22 1309  HGB 11.8 11.6    CMP     Component Value Date/Time   NA 144 12/21/2022 1104   K 3.3 (L) 12/21/2022 1104   CL 103 12/21/2022 1104   CO2 26 12/21/2022 1104   GLUCOSE 117 (H) 12/21/2022 1104   GLUCOSE 85 08/10/2019 1403   BUN 7 (L) 12/21/2022 1104   CREATININE 1.03 (H) 12/21/2022 1104   CREATININE 1.91 (H) 08/10/2019 1403   CALCIUM  9.2 12/21/2022 1104   PROT 6.8 12/21/2022 1104   ALBUMIN 4.3 12/21/2022 1104   AST 21 12/21/2022 1104   ALT 11 12/21/2022 1104   ALKPHOS 85 12/21/2022 1104   BILITOT 0.3 12/21/2022 1104   GFRNONAA 57 (L) 06/05/2020 1432   GFRNONAA 28 (L) 08/10/2019 1403   GFRAA 65 06/05/2020  1432   GFRAA 32 (L) 08/10/2019 1403      Latest Ref Rng & Units 12/21/2022   11:04 AM 07/16/2022    2:08 PM 01/14/2022    1:34 PM  Hepatic Function  Total Protein 6.0 - 8.5 g/dL 6.8  6.8  7.0   Albumin 3.9 - 4.9 g/dL 4.3  4.3  4.6   AST 0 - 40 IU/L 21  19  29    ALT 0 - 32 IU/L 11  12  23    Alk Phosphatase 44 - 121 IU/L 85  120  104   Total Bilirubin 0.0 - 1.2 mg/dL 0.3  0.4  0.5       Current Medications:     Current Outpatient Medications (Cardiovascular):    amLODipine  (NORVASC ) 10 MG tablet, Take 1 tablet (10 mg total) by mouth daily.   ezetimibe  (ZETIA ) 10 MG tablet, Take 1 tablet (10 mg total) by mouth daily.   fenofibrate  (TRICOR ) 48 MG  tablet, Take 1 tablet (48 mg total) by mouth daily.   prazosin  (MINIPRESS ) 2 MG capsule, Take 1 capsule (2 mg total) by mouth at bedtime.   rosuvastatin  (CRESTOR ) 40 MG tablet, Take 1 tablet (40 mg total) by mouth daily.     Current Outpatient Medications (Analgesics):    aspirin  81 MG tablet, Take 81 mg by mouth daily.     Current Outpatient Medications (Other):    buPROPion  (WELLBUTRIN  XL) 300 MG 24 hr tablet, Take 1 tablet (300 mg total) by mouth every morning.   clonazePAM  (KLONOPIN ) 0.5 MG tablet, Take 1 tablet (0.5 mg total) by mouth 2 (two) times daily as needed. for anxiety   dexlansoprazole  (DEXILANT ) 60 MG capsule, Take 1 capsule (60 mg total) by mouth daily.   DULoxetine  (CYMBALTA ) 60 MG capsule, Take 2 capsules (120 mg total) by mouth daily.   famotidine  (PEPCID ) 20 MG tablet, Take 1 tablet (20 mg total) by mouth at bedtime.   gabapentin  (NEURONTIN ) 600 MG tablet, Take 1 tablet (600 mg total) by mouth 3 (three) times daily.   metaxalone  (SKELAXIN ) 800 MG tablet, Take one tablet  by mouth at bedtime  for muscle spasm   methylphenidate  (RITALIN ) 10 MG tablet, Take 1 tablet (10 mg total) by mouth 2 (two) times daily with breakfast and lunch.   methylphenidate  (RITALIN ) 10 MG tablet, Take 1 tablet (10 mg total) by mouth 2 (two) times daily with breakfast and lunch.   methylphenidate  (RITALIN ) 10 MG tablet, Take 1 tablet (10 mg total) by mouth 2 (two) times daily with breakfast and lunch.   mirabegron  ER (MYRBETRIQ ) 25 MG TB24 tablet, Take 1 tablet (25 mg total) by mouth daily.   Multiple Vitamin (MULTIVITAMIN WITH MINERALS) TABS tablet, Take 1 tablet by mouth daily.   pantoprazole  (PROTONIX ) 40 MG tablet, Take 1 tablet (40 mg total) by mouth 2 (two) times daily before a meal.   potassium chloride  SA (KLOR-CON  M) 20 MEQ tablet, Take 1 tablet (20 mEq total) by mouth 2 (two) times daily.   Vitamin D , Ergocalciferol , (DRISDOL ) 1.25 MG (50000 UNIT) CAPS capsule, Take 1 capsule (50,000  Units total) by mouth every 7 (seven) days.   Facility-Administered Medications Ordered in Other Visits (Other):    gadopentetate dimeglumine  (MAGNEVIST ) injection 20 mL No current facility-administered medications for this visit.  Medical History:  Past Medical History:  Diagnosis Date   Allergy    Anemia    Anxiety    Arthritis    Back pain  Bell's palsy 08/2017   Depression    Depression    Phreesia 05/19/2020   Diverticulosis    GERD (gastroesophageal reflux disease)    Hepatic steatosis    Hiatal hernia    History of Bell's palsy 02/11/2018   Hyperlipidemia    Hypertension    Internal hemorrhoids    Internal hemorrhoids    MS (multiple sclerosis) (HCC)    2007   Multiple sclerosis (HCC) 04/04/2008   Qualifier: Diagnosis of  By: Antonetta MD, Rollene Snooks ring    Sleep apnea    wears CPAP   Allergies: No Known Allergies   Surgical History:  She  has a past surgical history that includes Abdominal hysterectomy (2005); Esophagogastroduodenoscopy (N/A, 09/18/2013); Upper gastrointestinal endoscopy; Cardiac catheterization (N/A, 05/04/2016); Wisdom tooth extraction; Colonoscopy; and Replacement total knee (Left, 03/11/2022). Family History:  Her family history includes Depression in her mother; Healthy in her daughter and son; High Cholesterol in her sister; Hyperlipidemia in her mother; Hypertension in her father, mother, and sister.  REVIEW OF SYSTEMS  : All other systems reviewed and negative except where noted in the History of Present Illness.  PHYSICAL EXAM: There were no vitals taken for this visit. General Appearance: Well nourished, in no apparent distress. Head:   Normocephalic and atraumatic. Eyes:  sclerae anicteric,conjunctive pink  Respiratory: Respiratory effort normal, BS equal bilaterally without rales, rhonchi, wheezing. Cardio: RRR with no MRGs. Peripheral pulses intact.  Abdomen: Soft,   {BlankSingle:19197::Flat,Obese,Non-distended} ,active bowel sounds. {actendernessAB:27319} tenderness {anatomy; site abdomen:5010}. {BlankMultiple:19196::Without guarding,With guarding,Without rebound,With rebound}. No masses. Rectal: {acrectalexam:27461} Musculoskeletal: Full ROM, {PSY - GAIT AND STATION:22860} gait. {With/Without:[id number]} edema. Skin:  Dry and intact without significant lesions or rashes Neuro: Alert and  oriented x4;  No focal deficits. Psych:  Cooperative. Normal mood and affect.    Alan JONELLE Coombs, PA-C 9:11 AM

## 2023-05-12 ENCOUNTER — Ambulatory Visit: Payer: Self-pay | Admitting: Family Medicine

## 2023-05-12 ENCOUNTER — Ambulatory Visit (HOSPITAL_COMMUNITY): Payer: BC Managed Care – PPO

## 2023-05-17 ENCOUNTER — Ambulatory Visit (HOSPITAL_COMMUNITY): Payer: BC Managed Care – PPO

## 2023-05-18 ENCOUNTER — Encounter: Payer: Self-pay | Admitting: Family Medicine

## 2023-06-01 ENCOUNTER — Other Ambulatory Visit: Payer: Self-pay | Admitting: Family Medicine

## 2023-06-22 ENCOUNTER — Telehealth (INDEPENDENT_AMBULATORY_CARE_PROVIDER_SITE_OTHER): Payer: BC Managed Care – PPO | Admitting: Psychiatry

## 2023-06-22 ENCOUNTER — Encounter (HOSPITAL_COMMUNITY): Payer: Self-pay | Admitting: Psychiatry

## 2023-06-22 DIAGNOSIS — F331 Major depressive disorder, recurrent, moderate: Secondary | ICD-10-CM | POA: Diagnosis not present

## 2023-06-22 MED ORDER — METHYLPHENIDATE HCL 10 MG PO TABS: 10.0000 mg | ORAL_TABLET | Freq: Two times a day (BID) | ORAL | 0 refills | Status: DC

## 2023-06-22 MED ORDER — DULOXETINE HCL 60 MG PO CPEP: 120.0000 mg | ORAL_CAPSULE | Freq: Every day | ORAL | 3 refills | Status: DC

## 2023-06-22 MED ORDER — PRAZOSIN HCL 2 MG PO CAPS: 2.0000 mg | ORAL_CAPSULE | Freq: Every day | ORAL | 3 refills | Status: DC

## 2023-06-22 MED ORDER — METHYLPHENIDATE HCL 10 MG PO TABS
10.0000 mg | ORAL_TABLET | Freq: Two times a day (BID) | ORAL | 0 refills | Status: DC
Start: 2023-06-22 — End: 2023-11-18

## 2023-06-22 MED ORDER — BUPROPION HCL ER (XL) 300 MG PO TB24: 300.0000 mg | ORAL_TABLET | ORAL | 2 refills | Status: DC

## 2023-06-22 MED ORDER — CLONAZEPAM 0.5 MG PO TABS: 0.5000 mg | ORAL_TABLET | Freq: Two times a day (BID) | ORAL | 2 refills | Status: DC | PRN

## 2023-06-22 MED ORDER — ZOLPIDEM TARTRATE 10 MG PO TABS: 10.0000 mg | ORAL_TABLET | Freq: Every evening | ORAL | 2 refills | Status: DC | PRN

## 2023-06-22 NOTE — Progress Notes (Signed)
 Virtual Visit via Video Note  I connected with Pamela Walters on 06/22/23 at  1:40 PM EST by a video enabled telemedicine application and verified that I am speaking with the correct person using two identifiers.  Location: Patient: home Provider: office   I discussed the limitations of evaluation and management by telemedicine and the availability of in person appointments. The patient expressed understanding and agreed to proceed.     I discussed the assessment and treatment plan with the patient. The patient was provided an opportunity to ask questions and all were answered. The patient agreed with the plan and demonstrated an understanding of the instructions.   The patient was advised to call back or seek an in-person evaluation if the symptoms worsen or if the condition fails to improve as anticipated.  I provided 20 minutes of non-face-to-face time during this encounter.   [name], MD  [facility] MD/PA/NP OP Progress Note  06/22/2023 2:00 PM Pamela Walters  MRN:  [id number]  Chief Complaint:  Chief Complaint  Patient presents with   Depression   Anxiety   Follow-up   HPI: This patient is a 63 year old married white female who lives with her husband in [place].  She is a retired Public house manager.  The patient returns for follow-up after 3 months regarding her depression anxiety and difficulties with focus.  She states that she still having trouble recovering from her second knee replacement on the right knee in September.  It seems to be taking a lot longer to heal than the one she had on the left knee.  However she is slowly getting more mobility.  She has been driving and getting out.  She states her mood is generally pretty good she is focusing well she is not sleeping well and is tired of being up till 3 and 4 in the morning.  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  She states she tried trazodone  in the past which caused nightmares.  She has never tried Ambien and I think it would be worth a try to try to get her sleep cycle renormalized.  She does use the prazosin at night which has helped with the nightmares.  The methylphenidate continues to help with the energy and focus. Visit Diagnosis:    ICD-10-CM   1. Major depressive disorder, recurrent episode, moderate (HCC)  F33.1       Past Psychiatric History: None  Past Medical History:  Past Medical History:  Diagnosis Date   Allergy    Anemia    Anxiety    Arthritis    Back pain    Bell's palsy 08/2017   Depression    Depression    Phreesia 05/19/2020   Diverticulosis    GERD (gastroesophageal reflux disease)    Hepatic steatosis    Hiatal hernia    History of Bell's palsy 02/11/2018   Hyperlipidemia    Hypertension    Internal hemorrhoids    Internal hemorrhoids    MS (multiple sclerosis) (HCC)    2007   Multiple sclerosis (HCC) 04/04/2008   Qualifier: Diagnosis of  By: Lodema Hong MD, Margaret     Schatzki's ring    Sleep apnea    wears CPAP    Past Surgical History:  Procedure Laterality Date   ABDOMINAL HYSTERECTOMY  2005   fibroids   CARDIAC CATHETERIZATION N/A 05/04/2016   Procedure: Left  Heart Cath and Coronary Angiography;  Surgeon: Corky Crafts, MD;  [facility] INVASIVE CV LAB;  Service: Cardiovascular;  Laterality: N/A;   COLONOSCOPY     ESOPHAGOGASTRODUODENOSCOPY N/A 09/18/2013   Procedure: ESOPHAGOGASTRODUODENOSCOPY (EGD);  Surgeon: Beverley Fiedler, MD;  Location: [facility] ENDOSCOPY;  Service: Gastroenterology;  Laterality: N/A;   REPLACEMENT TOTAL KNEE Left 03/11/2022   UPPER GASTROINTESTINAL ENDOSCOPY     WISDOM TOOTH EXTRACTION      Family Psychiatric History: See below  Family History:  Family History  Problem Relation Age of Onset   Hypertension Mother    Hyperlipidemia Mother    Depression Mother    Hypertension Father    Hypertension Sister    High Cholesterol Sister    Healthy  Son    Healthy Daughter    Colon cancer Neg Hx    Esophageal cancer Neg Hx    Rectal cancer Neg Hx    Stomach cancer Neg Hx    Colon polyps Neg Hx     Social History:  Social History   Socioeconomic History   Marital status: Married    Spouse name: Not on file   Number of children: 2   Years of education: Not on file   Highest education level: Not on file  Occupational History   Occupation: retired  Tobacco Use   Smoking status: Never   Smokeless tobacco: Never  Vaping Use   Vaping status: Never Used  Substance and Sexual Activity   Alcohol use: No   Drug use: No   Sexual activity: Yes    Birth control/protection: Surgical    Comment: hyst  Other Topics Concern   Not on file  Social History Narrative   Not on file   Social Drivers of Health   Financial Resource Strain: Low Risk  (06/05/2020)   Overall Financial Resource Strain (CARDIA)    Difficulty of Paying Living Expenses: Not very hard  Food Insecurity: No Food Insecurity (06/05/2020)   Hunger Vital Sign    Worried About Running Out of Food in the Last Year: Never true    Ran Out of Food in the Last Year: Never true  Transportation Needs: No Transportation Needs (06/05/2020)   PRAPARE - Administrator, Civil Service (Medical): No    Lack of Transportation (Non-Medical): No  Physical Activity: Insufficiently Active (06/05/2020)   Exercise Vital Sign    Days of Exercise per Week: 3 days    Minutes of Exercise per Session: 30 min  Stress: Stress Concern Present (06/05/2020)   [employer] of Occupational Health - Occupational Stress Questionnaire    Feeling of Stress : Very much  Social Connections: Moderately Isolated (06/05/2020)   Social Connection and Isolation Panel [NHANES]    Frequency of Communication with Friends and Family: Once a week    Frequency of Social Gatherings with Friends and Family: Once a week    Attends Religious Services: More than 4 times per year    Active Member of [employer] or  Organizations: No    Attends Banker Meetings: Never    Marital Status: Married    Allergies: No Known Allergies  Metabolic Disorder Labs: Lab Results  Component Value Date   HGBA1C 5.5 05/10/2017   MPG 111 05/10/2017   MPG 108 02/20/2016   No results found for: "PROLACTIN" Lab Results  Component Value Date   CHOL 107 12/21/2022   TRIG 93 12/21/2022   HDL 45 12/21/2022   CHOLHDL 2.4 12/21/2022  VLDL 34 (H) 06/30/2016   LDLCALC 44 12/21/2022   LDLCALC 79 07/16/2022   Lab Results  Component Value Date   TSH 3.210 07/16/2022   TSH 1.990 01/14/2022    Therapeutic Level Labs: No results found for: "LITHIUM" No results found for: "VALPROATE" No results found for: "CBMZ"  Current Medications: Current Outpatient Medications  Medication Sig Dispense Refill   zolpidem (AMBIEN) 10 MG tablet Take 1 tablet (10 mg total) by mouth at bedtime as needed for sleep. 30 tablet 2   amLODipine (NORVASC) 10 MG tablet Take 1 tablet (10 mg total) by mouth daily. 90 tablet 3   aspirin 81 MG tablet Take 81 mg by mouth daily.     buPROPion (WELLBUTRIN XL) 300 MG 24 hr tablet Take 1 tablet (300 mg total) by mouth every morning. 30 tablet 2   clonazePAM (KLONOPIN) 0.5 MG tablet Take 1 tablet (0.5 mg total) by mouth 2 (two) times daily as needed. for anxiety 60 tablet 2   dexlansoprazole (DEXILANT) 60 MG capsule Take 1 capsule (60 mg total) by mouth daily. 90 capsule 3   DULoxetine (CYMBALTA) 60 MG capsule Take 2 capsules (120 mg total) by mouth daily. 180 capsule 3   ezetimibe (ZETIA) 10 MG tablet Take 1 tablet (10 mg total) by mouth daily. 90 tablet 3   famotidine (PEPCID) 20 MG tablet Take 1 tablet (20 mg total) by mouth at bedtime. 90 tablet 3   fenofibrate (TRICOR) 48 MG tablet Take 1 tablet (48 mg total) by mouth daily. 90 tablet 2   gabapentin (NEURONTIN) 600 MG tablet Take 1 tablet (600 mg total) by mouth 3 (three) times daily. 270 tablet 1   metaxalone (SKELAXIN) 800 MG  tablet Take one tablet  by mouth at bedtime  for muscle spasm 90 tablet 1   methylphenidate (RITALIN) 10 MG tablet Take 1 tablet (10 mg total) by mouth 2 (two) times daily with breakfast and lunch. 60 tablet 0   methylphenidate (RITALIN) 10 MG tablet Take 1 tablet (10 mg total) by mouth 2 (two) times daily with breakfast and lunch. 60 tablet 0   methylphenidate (RITALIN) 10 MG tablet Take 1 tablet (10 mg total) by mouth 2 (two) times daily with breakfast and lunch. 60 tablet 0   mirabegron ER (MYRBETRIQ) 25 MG TB24 tablet Take 1 tablet (25 mg total) by mouth daily. 90 tablet 1   Multiple Vitamin (MULTIVITAMIN WITH MINERALS) TABS tablet Take 1 tablet by mouth daily.     pantoprazole (PROTONIX) 40 MG tablet Take 1 tablet (40 mg total) by mouth 2 (two) times daily before a meal. 60 tablet 3   potassium chloride SA (KLOR-CON M) 20 MEQ tablet Take 1 tablet (20 mEq total) by mouth 2 (two) times daily. 180 tablet 2   prazosin (MINIPRESS) 2 MG capsule Take 1 capsule (2 mg total) by mouth at bedtime. 90 capsule 3   rosuvastatin (CRESTOR) 40 MG tablet Take 1 tablet (40 mg total) by mouth daily. 90 tablet 3   Vitamin D, Ergocalciferol, (DRISDOL) 1.25 MG (50000 UNIT) CAPS capsule TAKE 1 CAPSULE BY MOUTH EVERY 7  DAYS 13 capsule 3   No current facility-administered medications for this visit.   Facility-Administered Medications Ordered in Other Visits  Medication Dose Route Frequency Provider Last Rate Last Admin   gadopentetate dimeglumine (MAGNEVIST) injection 20 mL  20 mL Intravenous Once PRN Sater, Pearletha Furl, MD         Musculoskeletal: Strength & Muscle Tone: within normal  limits Gait & Station: normal Patient leans: N/A  Psychiatric Specialty Exam: Review of Systems  Musculoskeletal:  Positive for arthralgias and joint swelling.  Psychiatric/Behavioral:  Positive for sleep disturbance.   All other systems reviewed and are negative.   There were no vitals taken for this visit.There is no  height or weight on file to calculate BMI.  General Appearance: Casual and Fairly Groomed  Eye Contact:  Good  Speech:  Clear and Coherent  Volume:  Normal  Mood:  Euthymic  Affect:  Congruent  Thought Process:  Goal Directed  Orientation:  Full (Time, Place, and Person)  Thought Content: Rumination   Suicidal Thoughts:  No  Homicidal Thoughts:  No  Memory:  Immediate;   Good Recent;   Good Remote;   Good  Judgement:  Good  Insight:  Good  Psychomotor Activity:  Decreased  Concentration:  Concentration: Good and Attention Span: Good  Recall:  Good  Fund of Knowledge: Good  Language: Good  Akathisia:  No  Handed:  Right  AIMS (if indicated): not done  Assets:  Communication Skills Desire for Improvement Resilience Social Support Talents/Skills  ADL's:  Intact  Cognition: WNL  Sleep:  Poor   Screenings: GAD-7    Flowsheet Row Office Visit from 12/22/2022 in [facility] Office Visit from 08/06/2022 in [facility] Office Visit from 07/16/2022 in [facility] Office Visit from 12/31/2021 in [facility] Office Visit from 06/05/2020 in [facility]  Total GAD-7 Score 10 7 8 6 14       PHQ2-9    Flowsheet Row Office Visit from 12/22/2022 in [facility] Office Visit from 08/06/2022 in [facility] Office Visit from 07/16/2022 in [facility] Office Visit from 01/14/2022 in [facility] Office Visit from 12/31/2021 in [facility]  PHQ-2 Total Score 2 3 4  0 0  PHQ-9 Total Score 12 13 13  -- --      Flowsheet Row Video Visit from 07/11/2021 in [facility] [place] Video Visit from 02/19/2021 in [facility] [place] Video Visit from 01/23/2021 in [facility] at [place]  C-SSRS RISK CATEGORY No Risk No Risk No Risk        Assessment and Plan: This patient is a 63 year old female with a history of depression anxiety chronic fatigue and fibromyalgia.  She is now having a lot of trouble sleeping so we will try Ambien 10 mg at bedtime.  She will continue Wellbutrin XL 300 mg daily as well as Cymbalta 60 mg twice daily for depression prazosin 2 mg at bedtime to prevent nightmares, clonazepam 0.5 mg twice daily only as needed for anxiety and methylphenidate 10 mg twice daily for focus and alertness.  She will return to see me in 3 months or call sooner if the Ambien does not help  Collaboration of Care: Collaboration of Care: Primary Care Provider AEB notes are shared with PCP on the epic system  Patient/Guardian was advised Release of Information must be obtained prior to any record release in order to collaborate their care with an outside provider. Patient/Guardian was advised if they have not already done so to contact the registration department to sign all necessary forms in order for [place] to release information regarding their care.   Consent:  Patient/Guardian gives verbal consent for treatment and assignment of benefits for services provided during this visit. Patient/Guardian expressed understanding and agreed to proceed.    [name], MD 06/22/2023, 2:00 PM

## 2023-06-24 ENCOUNTER — Telehealth: Payer: Self-pay

## 2023-06-24 NOTE — Telephone Encounter (Signed)
 Pharmacy Patient Advocate Encounter   Received notification from CoverMyMeds that prior authorization for Trulance 3MG  tablets is required/requested.   Insurance verification completed.   The patient is insured through [facility] .   Per test claim: PA required; PA submitted to above mentioned insurance via CoverMyMeds Key/confirmation #/EOC [facility] Status is pending

## 2023-06-30 ENCOUNTER — Other Ambulatory Visit: Payer: Self-pay | Admitting: Family Medicine

## 2023-07-09 DIAGNOSIS — H4720 Unspecified optic atrophy: Secondary | ICD-10-CM | POA: Diagnosis not present

## 2023-07-19 NOTE — Telephone Encounter (Signed)
 Dr Rhea Belton-  Please see denial for Trulance and advise.Marland KitchenMarland Kitchen

## 2023-07-19 NOTE — Telephone Encounter (Signed)
 Pharmacy Patient Advocate Encounter  Received notification from [facility] that Prior Authorization for Trulance 3MG  tablets has been DENIED.  Full denial letter will be uploaded to the media tab. See denial reason below.  Per your health plan's criteria, this drug is covered if you meet the following:  (1) There are paid claims or your doctor provides medical records (for example: chart notes) showing you have tried or cannot use at least two covered drugs or cannot use all covered drugs (if only one or only two covered drugs are available, you must have tried or cannot use all that are available). Covered drugs include: (A) Linzess* (B) Lubiprostone. *Please note: The drug listed above may require additional review  PA #/Case ID/Reference #: [id number]

## 2023-07-20 MED ORDER — LINACLOTIDE 145 MCG PO CAPS: 145.0000 ug | ORAL_CAPSULE | Freq: Every day | ORAL | 0 refills | Status: AC

## 2023-07-20 NOTE — Telephone Encounter (Signed)
 Left message for patient to call back

## 2023-07-20 NOTE — Telephone Encounter (Signed)
 Spoke to patient. She says that she had samples of Trulance and was doing well on it. However, has been on nothing recently due to running out of samples and not having insurance coverage of the medication. She is agreeable to trying Linzess 145 mcg daily and will let us know if ineffective or if she has side effects.

## 2023-07-20 NOTE — Telephone Encounter (Signed)
 Patient returned call, please advise.

## 2023-07-20 NOTE — Telephone Encounter (Signed)
 Ok, her insurance has done this to her before Will you please let her know why the change, please clarify has she been established on Trulance?  If so, does her insuarnce provider know that? Can try Linzess 145 mcg daily -- of course she should let us know how it is working or if she has side effects (abd pain, diarrhea, nausea, etc) JMP

## 2023-07-21 ENCOUNTER — Other Ambulatory Visit: Payer: Self-pay | Admitting: Family Medicine

## 2023-08-31 ENCOUNTER — Other Ambulatory Visit: Payer: Self-pay | Admitting: Family Medicine

## 2023-09-04 ENCOUNTER — Other Ambulatory Visit: Payer: Self-pay | Admitting: Family Medicine

## 2023-09-13 ENCOUNTER — Other Ambulatory Visit (HOSPITAL_COMMUNITY): Payer: Self-pay | Admitting: Family Medicine

## 2023-09-13 DIAGNOSIS — Z1231 Encounter for screening mammogram for malignant neoplasm of breast: Secondary | ICD-10-CM

## 2023-09-16 ENCOUNTER — Encounter (HOSPITAL_COMMUNITY): Payer: Self-pay

## 2023-09-16 ENCOUNTER — Ambulatory Visit (HOSPITAL_COMMUNITY)
Admission: RE | Admit: 2023-09-16 | Discharge: 2023-09-16 | Disposition: A | Discharge status: Home / Self Care | Source: Ambulatory Visit | Attending: Family Medicine | Admitting: Family Medicine

## 2023-09-16 DIAGNOSIS — Z1231 Encounter for screening mammogram for malignant neoplasm of breast: Secondary | ICD-10-CM | POA: Insufficient documentation

## 2023-09-17 ENCOUNTER — Ambulatory Visit: Payer: Self-pay | Admitting: Family Medicine

## 2023-09-17 ENCOUNTER — Encounter: Payer: Self-pay | Admitting: Family Medicine

## 2023-09-17 VITALS — BP 122/72 | HR 83 | Temp 98.5°F | Ht 63.0 in | Wt 175.0 lb

## 2023-09-17 DIAGNOSIS — I10 Essential (primary) hypertension: Secondary | ICD-10-CM

## 2023-09-17 DIAGNOSIS — E663 Overweight: Secondary | ICD-10-CM

## 2023-09-17 DIAGNOSIS — N39498 Other specified urinary incontinence: Secondary | ICD-10-CM

## 2023-09-17 DIAGNOSIS — F418 Other specified anxiety disorders: Secondary | ICD-10-CM

## 2023-09-17 DIAGNOSIS — Z23 Encounter for immunization: Secondary | ICD-10-CM

## 2023-09-17 DIAGNOSIS — R7989 Other specified abnormal findings of blood chemistry: Secondary | ICD-10-CM | POA: Insufficient documentation

## 2023-09-17 DIAGNOSIS — R9089 Other abnormal findings on diagnostic imaging of central nervous system: Secondary | ICD-10-CM

## 2023-09-17 DIAGNOSIS — G4733 Obstructive sleep apnea (adult) (pediatric): Secondary | ICD-10-CM

## 2023-09-17 DIAGNOSIS — E785 Hyperlipidemia, unspecified: Secondary | ICD-10-CM

## 2023-09-17 DIAGNOSIS — R42 Dizziness and giddiness: Secondary | ICD-10-CM

## 2023-09-17 NOTE — Assessment & Plan Note (Signed)
  Patient re-educated about  the importance of commitment to a  minimum of 150 minutes of exercise per week as able.  The importance of healthy food choices with portion control discussed, as well as eating regularly and within a 12 hour window most days. The need to choose "clean , green" food 50 to 75% of the time is discussed, as well as to make water the primary drink and set a goal of 64 ounces water daily.       09/17/2023    3:41 PM 12/31/2022    2:37 PM 12/22/2022   10:52 AM  Weight /BMI  Weight 175 lb 0.6 oz 168 lb 168 lb 1.3 oz  Height 5\' 3"  (1.6 m) 5\' 2"  (1.575 m) 5\' 2"  (1.575 m)  BMI 31.01 kg/m2 30.73 kg/m2 30.74 kg/m2

## 2023-09-17 NOTE — Assessment & Plan Note (Signed)
 TdaP and pneumonia 20 today

## 2023-09-17 NOTE — Assessment & Plan Note (Signed)
 Hyperlipidemia:Low fat diet discussed and encouraged.   Lipid Panel  Lab Results  Component Value Date   CHOL 107 12/21/2022   HDL 45 12/21/2022   LDLCALC 44 12/21/2022   TRIG 93 12/21/2022   CHOLHDL 2.4 12/21/2022     Updated lab needed at/ before next visit.

## 2023-09-17 NOTE — Patient Instructions (Signed)
 F/U in 4 months call if you neeed me before  You will be referred to Dr Darlin Ehrlich  re vertigo    You are referred to [facility] pulmonary re sleep apnea  You will be referred to a new Neurologist re MS , abn brain scan  Pneumonia 20 and TdAP today   It is important that you exercise regularly at least 30 minutes 5 times a week. If you develop chest pain, have severe difficulty breathing, or feel very tired, stop exercising immediately and seek medical attention   Thanks for choosing [facility], we consider it a privelige to serve you.

## 2023-09-17 NOTE — Assessment & Plan Note (Signed)
 Controlled, no change in medication DASH diet and commitment to daily physical activity for a minimum of 30 minutes discussed and encouraged, as a part of hypertension management. The importance of attaining a healthy weight is also discussed.     09/17/2023    3:41 PM 01/05/2023   10:51 AM 12/31/2022    3:57 PM 12/31/2022    3:40 PM 12/31/2022    3:28 PM 12/31/2022    3:23 PM 12/31/2022    3:20 PM  BP/Weight  Systolic BP 122 121 101 120 107 85 83  Diastolic BP 72 75 62 57 56 52 47  Wt. (Lbs) 175.04        BMI 31.01 kg/m2

## 2023-09-17 NOTE — Assessment & Plan Note (Signed)
 Needs visit with Neurology. Abn brain scan with white matter disease, has been told by one neurologist that she does have MS and another that is unclear,requesting new Neurologic evaluation, last seen  by Dr Godwin Lat in 08/2019, will specifically send message to pt asking if she does want another Neurologist, scan in 2022 reported essentially unchanged, holding on  referral until I get a response

## 2023-09-17 NOTE — Assessment & Plan Note (Signed)
 Recurrent vertigo

## 2023-09-18 LAB — CBC
Hematocrit: 39.2 % (ref 34.0–46.6)
Hemoglobin: 12.6 g/dL (ref 11.1–15.9)
MCH: 26.3 pg — ABNORMAL LOW (ref 26.6–33.0)
MCHC: 32.1 g/dL (ref 31.5–35.7)
MCV: 82 fL (ref 79–97)
Platelets: 365 10*3/uL (ref 150–450)
RBC: 4.79 x10E6/uL (ref 3.77–5.28)
RDW: 15.7 % — ABNORMAL HIGH (ref 11.7–15.4)
WBC: 8.8 10*3/uL (ref 3.4–10.8)

## 2023-09-18 LAB — LIPID PANEL W/O CHOL/HDL RATIO
Cholesterol, Total: 116 mg/dL (ref 100–199)
HDL: 50 mg/dL (ref >39)
LDL Chol Calc (NIH): 44 mg/dL (ref 0–99)
Triglycerides: 128 mg/dL (ref 0–149)
VLDL Cholesterol Cal: 22 mg/dL (ref 5–40)

## 2023-09-18 LAB — CMP14+EGFR
ALT: 15 IU/L (ref 0–32)
AST: 22 IU/L (ref 0–40)
Albumin: 4.7 g/dL (ref 3.9–4.9)
Alkaline Phosphatase: 109 IU/L (ref 44–121)
BUN/Creatinine Ratio: 10 — ABNORMAL LOW (ref 12–28)
BUN: 11 mg/dL (ref 8–27)
Bilirubin Total: 0.3 mg/dL (ref 0.0–1.2)
CO2: 23 mmol/L (ref 20–29)
Calcium: 9.8 mg/dL (ref 8.7–10.3)
Chloride: 103 mmol/L (ref 96–106)
Creatinine, Ser: 1.1 mg/dL — ABNORMAL HIGH (ref 0.57–1.00)
Globulin, Total: 2.6 g/dL (ref 1.5–4.5)
Glucose: 100 mg/dL — ABNORMAL HIGH (ref 70–99)
Potassium: 3.7 mmol/L (ref 3.5–5.2)
Sodium: 144 mmol/L (ref 134–144)
Total Protein: 7.3 g/dL (ref 6.0–8.5)
eGFR: 56 mL/min/{1.73_m2} — ABNORMAL LOW (ref >59)

## 2023-09-18 LAB — TSH+FREE T4
Free T4: 0.97 ng/dL (ref 0.82–1.77)
TSH: 2.71 u[IU]/mL (ref 0.450–4.500)

## 2023-09-19 ENCOUNTER — Encounter: Payer: Self-pay | Admitting: Family Medicine

## 2023-09-19 ENCOUNTER — Ambulatory Visit: Payer: Self-pay | Admitting: Family Medicine

## 2023-09-19 NOTE — Assessment & Plan Note (Signed)
 Updated lab needed at/ before next visit.

## 2023-09-19 NOTE — Assessment & Plan Note (Signed)
 Managed by Psychiatry , sub optimal control, not suicidal or homicidal

## 2023-09-19 NOTE — Assessment & Plan Note (Signed)
 Needs to establish care will refer to local Provider

## 2023-09-19 NOTE — Assessment & Plan Note (Signed)
 Using generic med which she can afford
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
## 2023-09-27 DIAGNOSIS — H18513 Endothelial corneal dystrophy, bilateral: Secondary | ICD-10-CM | POA: Diagnosis not present

## 2023-09-27 DIAGNOSIS — H524 Presbyopia: Secondary | ICD-10-CM | POA: Diagnosis not present

## 2023-09-27 DIAGNOSIS — L723 Sebaceous cyst: Secondary | ICD-10-CM | POA: Diagnosis not present

## 2023-09-27 DIAGNOSIS — H25813 Combined forms of age-related cataract, bilateral: Secondary | ICD-10-CM | POA: Diagnosis not present

## 2023-10-04 ENCOUNTER — Other Ambulatory Visit (HOSPITAL_COMMUNITY): Payer: Self-pay | Admitting: Psychiatry

## 2023-10-07 ENCOUNTER — Ambulatory Visit: Payer: Self-pay | Admitting: Family Medicine

## 2023-10-13 ENCOUNTER — Other Ambulatory Visit: Payer: Self-pay | Admitting: Internal Medicine

## 2023-10-20 ENCOUNTER — Other Ambulatory Visit: Payer: Self-pay | Admitting: Family Medicine

## 2023-10-21 ENCOUNTER — Other Ambulatory Visit: Payer: Self-pay | Admitting: Family Medicine

## 2023-10-22 ENCOUNTER — Telehealth: Payer: Self-pay

## 2023-10-22 NOTE — Telephone Encounter (Signed)
 Pt requesting refill of Metalaxone. Last filled 11/2022. Please refill if appropriate

## 2023-10-25 ENCOUNTER — Other Ambulatory Visit: Payer: Self-pay | Admitting: Family Medicine

## 2023-10-25 MED ORDER — METAXALONE 800 MG PO TABS: ORAL_TABLET | ORAL | 3 refills | Status: AC

## 2023-10-25 NOTE — Telephone Encounter (Signed)
Prescription sent to local pharmacy

## 2023-10-28 ENCOUNTER — Encounter (INDEPENDENT_AMBULATORY_CARE_PROVIDER_SITE_OTHER): Payer: Self-pay

## 2023-11-03 ENCOUNTER — Other Ambulatory Visit (HOSPITAL_COMMUNITY): Payer: Self-pay | Admitting: Psychiatry

## 2023-11-03 NOTE — Telephone Encounter (Signed)
 Call for appt

## 2023-11-09 ENCOUNTER — Other Ambulatory Visit (HOSPITAL_COMMUNITY): Payer: Self-pay | Admitting: Psychiatry

## 2023-11-09 NOTE — Telephone Encounter (Signed)
 Call for appt

## 2023-11-15 ENCOUNTER — Other Ambulatory Visit: Payer: Self-pay | Admitting: Family Medicine

## 2023-11-18 ENCOUNTER — Telehealth (HOSPITAL_COMMUNITY): Admitting: Psychiatry

## 2023-11-18 ENCOUNTER — Encounter (HOSPITAL_COMMUNITY): Payer: Self-pay | Admitting: Psychiatry

## 2023-11-18 DIAGNOSIS — F331 Major depressive disorder, recurrent, moderate: Secondary | ICD-10-CM

## 2023-11-18 MED ORDER — DULOXETINE HCL 60 MG PO CPEP: 120.0000 mg | ORAL_CAPSULE | Freq: Every day | ORAL | 3 refills | Status: DC

## 2023-11-18 MED ORDER — METHYLPHENIDATE HCL 10 MG PO TABS: 10.0000 mg | ORAL_TABLET | ORAL | 0 refills | Status: DC

## 2023-11-18 MED ORDER — PRAZOSIN HCL 2 MG PO CAPS: 2.0000 mg | ORAL_CAPSULE | Freq: Every day | ORAL | 3 refills | Status: DC

## 2023-11-18 MED ORDER — CLONAZEPAM 0.5 MG PO TABS: 0.5000 mg | ORAL_TABLET | Freq: Every day | ORAL | 2 refills | Status: DC | PRN

## 2023-11-18 MED ORDER — ZOLPIDEM TARTRATE 10 MG PO TABS: 10.0000 mg | ORAL_TABLET | Freq: Every evening | ORAL | 2 refills | Status: DC | PRN

## 2023-11-18 MED ORDER — METHYLPHENIDATE HCL 10 MG PO TABS: 10.0000 mg | ORAL_TABLET | Freq: Every morning | ORAL | 0 refills | Status: DC

## 2023-11-18 MED ORDER — BUPROPION HCL ER (XL) 300 MG PO TB24: 300.0000 mg | ORAL_TABLET | Freq: Every morning | ORAL | 2 refills | Status: DC

## 2023-11-18 NOTE — Progress Notes (Signed)
 Virtual Visit via Video Note  I connected with Nialah D Pressly on 11/18/23 at  3:00 PM EDT by a video enabled telemedicine application and verified that I am speaking with the correct person using two identifiers.  Location: Patient: home Provider: office   I discussed the limitations of evaluation and management by telemedicine and the availability of in person appointments. The patient expressed understanding and agreed to proceed.     I discussed the assessment and treatment plan with the patient. The patient was provided an opportunity to ask questions and all were answered. The patient agreed with the plan and demonstrated an understanding of the instructions.   The patient was advised to call back or seek an in-person evaluation if the symptoms worsen or if the condition fails to improve as anticipated.  I provided 20 minutes of non-face-to-face time during this encounter.   Barnie Gull, MD  [facility] MD/PA/NP OP Progress Note  11/18/2023 3:11 PM LINNIE DELGRANDE  MRN:  [id number]  Chief Complaint:  Chief Complaint  Patient presents with   Depression   Anxiety   Follow-up   HPI: This patient is a 63 year old married white female lives with her husband in [place].  She is a retired Public house manager.  The patient returns for follow-up after 5 months regarding her depression anxiety and difficulty with focus.  She states that she is finally feeling like she has recovered from her knee surgeries.  She has been spending a lot of time at her son's pool with her grandchildren.  She states that she is having a good summer and her mood has been good.  She denies significant depression anxiety thoughts of self-harm or suicide.  Last time she was not sleeping well but now that she is on Ambien  10 mg at bedtime she sleeps very well the praises that keeps her from having nightmares.  She is rarely using the clonazepam .  She only takes 1 methylphenidate  a day and this seems to keep her focused. Visit  Diagnosis:    ICD-10-CM   1. Major depressive disorder, recurrent episode, moderate (HCC)  F33.1       Past Psychiatric History: none  Past Medical History:  Past Medical History:  Diagnosis Date   Allergy    Anemia    Anxiety    Arthritis    Back pain    Bell's palsy 08/2017   Depression    Depression    Phreesia 05/19/2020   Diverticulosis    GERD (gastroesophageal reflux disease)    Hepatic steatosis    Hiatal hernia    History of Bell's palsy 02/11/2018   Hyperlipidemia    Hypertension    Internal hemorrhoids    Internal hemorrhoids    MS (multiple sclerosis) (HCC)    2007   Multiple sclerosis (HCC) 04/04/2008   Qualifier: Diagnosis of  By: Antonetta MD, Margaret     Schatzki's ring    Sleep apnea    wears CPAP    Past Surgical History:  Procedure Laterality Date   ABDOMINAL HYSTERECTOMY  2005   fibroids   CARDIAC CATHETERIZATION N/A 05/04/2016   Procedure: Left Heart Cath and Coronary Angiography;  Surgeon: Candyce GORMAN Reek, MD;  Location: [facility] INVASIVE CV LAB;  Service: Cardiovascular;  Laterality: N/A;   COLONOSCOPY     ESOPHAGOGASTRODUODENOSCOPY N/A 09/18/2013   Procedure: ESOPHAGOGASTRODUODENOSCOPY (EGD);  Surgeon: Gordy CHRISTELLA Starch, MD;  Location: [facility] ENDOSCOPY;  Service: Gastroenterology;  Laterality: N/A;   REPLACEMENT TOTAL KNEE Left 03/11/2022  UPPER GASTROINTESTINAL ENDOSCOPY     WISDOM TOOTH EXTRACTION      Family Psychiatric History: See below  Family History:  Family History  Problem Relation Age of Onset   Hypertension Mother    Hyperlipidemia Mother    Depression Mother    Hypertension Father    Hypertension Sister    High Cholesterol Sister    Healthy Son    Healthy Daughter    Colon cancer Neg Hx    Esophageal cancer Neg Hx    Rectal cancer Neg Hx    Stomach cancer Neg Hx    Colon polyps Neg Hx     Social History:  Social History   Socioeconomic History   Marital status: Married    Spouse name: Not on file   Number of  children: 2   Years of education: Not on file   Highest education level: Not on file  Occupational History   Occupation: retired  Tobacco Use   Smoking status: Never   Smokeless tobacco: Never  Vaping Use   Vaping status: Never Used  Substance and Sexual Activity   Alcohol use: No   Drug use: No   Sexual activity: Yes    Birth control/protection: Surgical    Comment: hyst  Other Topics Concern   Not on file  Social History Narrative   Not on file   Social Drivers of Health   Financial Resource Strain: Low Risk  (06/05/2020)   Overall Financial Resource Strain (CARDIA)    Difficulty of Paying Living Expenses: Not very hard  Food Insecurity: No Food Insecurity (06/05/2020)   Hunger Vital Sign    Worried About Running Out of Food in the Last Year: Never true    Ran Out of Food in the Last Year: Never true  Transportation Needs: No Transportation Needs (06/05/2020)   PRAPARE - Administrator, Civil Service (Medical): No    Lack of Transportation (Non-Medical): No  Physical Activity: Insufficiently Active (06/05/2020)   Exercise Vital Sign    Days of Exercise per Week: 3 days    Minutes of Exercise per Session: 30 min  Stress: Stress Concern Present (06/05/2020)   [employer] of Occupational Health - Occupational Stress Questionnaire    Feeling of Stress : Very much  Social Connections: Moderately Isolated (06/05/2020)   Social Connection and Isolation Panel    Frequency of Communication with Friends and Family: Once a week    Frequency of Social Gatherings with Friends and Family: Once a week    Attends Religious Services: More than 4 times per year    Active Member of [employer] or Organizations: No    Attends Banker Meetings: Never    Marital Status: Married    Allergies: No Known Allergies  Metabolic Disorder Labs: Lab Results  Component Value Date   HGBA1C 5.5 05/10/2017   MPG 111 05/10/2017   MPG 108 02/20/2016   No results found for:  PROLACTIN Lab Results  Component Value Date   CHOL 116 09/17/2023   TRIG 128 09/17/2023   HDL 50 09/17/2023   CHOLHDL 2.4 12/21/2022   VLDL 34 (H) 06/30/2016   LDLCALC 44 09/17/2023   LDLCALC 44 12/21/2022   Lab Results  Component Value Date   TSH 2.710 09/17/2023   TSH 3.210 07/16/2022    Therapeutic Level Labs: No results found for: LITHIUM No results found for: VALPROATE No results found for: CBMZ  Current Medications: Current Outpatient Medications  Medication Sig Dispense  Refill   methylphenidate  (RITALIN ) 10 MG tablet Take 1 tablet (10 mg total) by mouth every morning. 30 tablet 0   amLODipine  (NORVASC ) 10 MG tablet TAKE 1 TABLET BY MOUTH DAILY 90 tablet 1   aspirin  81 MG tablet Take 81 mg by mouth daily.     buPROPion  (WELLBUTRIN  XL) 300 MG 24 hr tablet Take 1 tablet (300 mg total) by mouth every morning. 90 tablet 2   clonazePAM  (KLONOPIN ) 0.5 MG tablet Take 1 tablet (0.5 mg total) by mouth daily as needed. for anxiety 30 tablet 2   dexlansoprazole  (DEXILANT ) 60 MG capsule Take 1 capsule (60 mg total) by mouth daily. 90 capsule 3   DULoxetine  (CYMBALTA ) 60 MG capsule Take 2 capsules (120 mg total) by mouth daily. 180 capsule 3   ezetimibe  (ZETIA ) 10 MG tablet TAKE 1 TABLET BY MOUTH DAILY 90 tablet 3   famotidine  (PEPCID ) 20 MG tablet Take 1 tablet (20 mg total) by mouth at bedtime. 90 tablet 3   fenofibrate  (TRICOR ) 48 MG tablet TAKE 1 TABLET BY MOUTH DAILY 90 tablet 3   gabapentin  (NEURONTIN ) 600 MG tablet TAKE 1 TABLET BY MOUTH 3 TIMES  DAILY 270 tablet 3   linaclotide  (LINZESS ) 145 MCG CAPS capsule Take 1 capsule (145 mcg total) by mouth daily before breakfast. Pharmacy- please d/c trulance  rx. Insurance will not cover 30 capsule 0   metaxalone  (SKELAXIN ) 800 MG tablet Take one tablet  by mouth at bedtime  for muscle spasm 90 tablet 1   metaxalone  (SKELAXIN ) 800 MG tablet Take one tablet by mouth at bedtime, as needed, for muscle spasm 30 tablet 3    methylphenidate  (RITALIN ) 10 MG tablet Take 1 tablet (10 mg total) by mouth every morning. 30 tablet 0   methylphenidate  (RITALIN ) 10 MG tablet Take 1 tablet (10 mg total) by mouth every morning. 30 tablet 0   Multiple Vitamin (MULTIVITAMIN WITH MINERALS) TABS tablet Take 1 tablet by mouth daily.     oxybutynin  (DITROPAN -XL) 10 MG 24 hr tablet Take 10 mg by mouth at bedtime.     pantoprazole  (PROTONIX ) 40 MG tablet TAKE 1 TABLET BY MOUTH TWICE DAILY BEFORE A MEAL 60 tablet 0   potassium chloride  SA (KLOR-CON  M) 20 MEQ tablet Take 1 tablet (20 mEq total) by mouth 2 (two) times daily. 180 tablet 2   prazosin  (MINIPRESS ) 2 MG capsule Take 1 capsule (2 mg total) by mouth at bedtime. 90 capsule 3   rosuvastatin  (CRESTOR ) 40 MG tablet TAKE 1 TABLET BY MOUTH DAILY 90 tablet 3   Vitamin D , Ergocalciferol , (DRISDOL ) 1.25 MG (50000 UNIT) CAPS capsule TAKE 1 CAPSULE BY MOUTH EVERY 7  DAYS 13 capsule 3   zolpidem  (AMBIEN ) 10 MG tablet Take 1 tablet (10 mg total) by mouth at bedtime as needed. for sleep 30 tablet 2   No current facility-administered medications for this visit.   Facility-Administered Medications Ordered in Other Visits  Medication Dose Route Frequency Provider Last Rate Last Admin   gadopentetate dimeglumine  (MAGNEVIST ) injection 20 mL  20 mL Intravenous Once PRN Sater, Charlie LABOR, MD         Musculoskeletal: Strength & Muscle Tone: within normal limits Gait & Station: normal Patient leans: N/A  Psychiatric Specialty Exam: Review of Systems  All other systems reviewed and are negative.   There were no vitals taken for this visit.There is no height or weight on file to calculate BMI.  General Appearance: Casual and Fairly Groomed  Eye Contact:  Good  Speech:  Clear and Coherent  Volume:  Normal  Mood:  Euthymic  Affect:  Congruent  Thought Process:  Goal Directed  Orientation:  Full (Time, Place, and Person)  Thought Content: WDL   Suicidal Thoughts:  No  Homicidal Thoughts:   No  Memory:  Immediate;   Good Recent;   Good Remote;   NA  Judgement:  Good  Insight:  Good  Psychomotor Activity:  Normal  Concentration:  Concentration: Good and Attention Span: Good  Recall:  Good  Fund of Knowledge: Good  Language: Good  Akathisia:  No  Handed:  Right  AIMS (if indicated): not done  Assets:  Communication Skills Desire for Improvement Resilience Social Support Talents/Skills  ADL's:  Intact  Cognition: WNL  Sleep:  Good   Screenings: GAD-7    Flowsheet Row Office Visit from 09/17/2023 in [facility] Office Visit from 12/22/2022 in [facility] Office Visit from 08/06/2022 in [facility] Office Visit from 07/16/2022 in [facility] Office Visit from 12/31/2021 in [facility] Care  Total GAD-7 Score 10 10 7 8 6    PHQ2-9    Flowsheet Row Office Visit from 09/17/2023 in [facility] Office Visit from 12/22/2022 in [facility] Office Visit from 08/06/2022 in [facility] Care Office Visit from 07/16/2022 in [facility] Primary Care Office Visit from 01/14/2022 in [facility] Care  PHQ-2 Total Score 2 2 3 4  0  PHQ-9 Total Score 11 12 13 13  --   Flowsheet Row Video Visit from 07/11/2021 in [facility] [place] Video Visit from 02/19/2021 in [facility] [place] Video Visit from 01/23/2021 in [facility] at [place]  C-SSRS RISK CATEGORY No Risk No Risk No Risk     Assessment and Plan: This patient is a 63 year old female with a history of depression anxiety chronic fatigue and fibromyalgia.  She is doing well on her current regimen.  She will continue Wellbutrin  XL 300 mg daily as well as Cymbalta  60 mg twice daily for depression, prazosin  2 mg at bedtime to prevent  nightmares, Ambien  10 mg at bedtime for sleep.  She will continue methylphenidate  10 mg every morning for focus and alertness and clonazepam  0.5 mg daily only as needed for anxiety.  She will return to see me in 3 months  Collaboration of Care: Collaboration of Care: Primary Care Provider AEB notes are shared with PCP on the epic system  Patient/Guardian was advised Release of Information must be obtained prior to any record release in order to collaborate their care with an outside provider. Patient/Guardian was advised if they have not already done so to contact the registration department to sign all necessary forms in order for us  to release information regarding their care.   Consent: Patient/Guardian gives verbal consent for treatment and assignment of benefits for services provided during this visit. Patient/Guardian expressed understanding and agreed to proceed.    Barnie Gull, MD 11/18/2023, 3:11 PM

## 2023-12-15 ENCOUNTER — Ambulatory Visit (HOSPITAL_BASED_OUTPATIENT_CLINIC_OR_DEPARTMENT_OTHER)

## 2023-12-15 ENCOUNTER — Other Ambulatory Visit (HOSPITAL_COMMUNITY): Payer: Self-pay | Admitting: Psychiatry

## 2023-12-15 ENCOUNTER — Encounter (HOSPITAL_BASED_OUTPATIENT_CLINIC_OR_DEPARTMENT_OTHER): Payer: Self-pay

## 2023-12-15 VITALS — BP 117/68 | HR 87 | Ht 63.0 in | Wt 171.0 lb

## 2023-12-15 DIAGNOSIS — G4733 Obstructive sleep apnea (adult) (pediatric): Secondary | ICD-10-CM

## 2023-12-15 NOTE — Progress Notes (Signed)
 Epworth Sleepiness Scale  Use the following scale to choose the most appropriate number for each situation. 0 Would never nod off 1  Slight  chance of nodding off 2 Moderate chance of nodding off 3 High chance of nodding off  Sitting and reading: 2 Watching TV: 2 Sitting, inactive, in a public place (e.g., in a meeting, theater, or dinner event): 1 As a passenger in a car for an hour or more without stopping for a break: 3 Lying down to rest when circumstances permit:2 Sitting and talking to someone: 1 Sitting quietly after a meal without alcohol: 2 In a car, while stopped for a few  minutes in traffic or at a light: 1  TOTOAL: 14

## 2023-12-15 NOTE — Assessment & Plan Note (Signed)
-   Given the fact that her sleep study is 63 years old, she has not used her machine in 6 months, and she is unclear what her settings are we will order split-night study to further evaluate -Pending results will resume CPAP at best tested pressure; follow-up for compliance download 4 to 6 weeks after institution of therapy.

## 2023-12-15 NOTE — Patient Instructions (Signed)
 Complete split night study as ordered today.  Follow up for results; new machine will be ordered once test is resulted.  Return to clinic sooner for new or worsening complaints.

## 2023-12-15 NOTE — Progress Notes (Signed)
 @Patient  ID: Pamela Walters, female    DOB: 1960/04/29, 63 y.o.   MRN: [id number]  Chief Complaint  Patient presents with   Establish Care    SLEEP     Referring provider: Antonetta Rollene BRAVO, MD  HPI: Pamela Walters is a 63 y/o female with PMH of OSA, allergic rhinitis, HTN, hyperlipidemia, and obesity who presents as a new patient for evaluation of sleep.  She reports that she was diagnosed with sleep apnea about 9 years ago and is previously followed with neurology for this.  She had a split-night study completed in 2016 and it appears that the best tested pressure was around 8 cm H2O.  She currently reports that her settings differ from this but she is unsure what they are.  She experiences benefit when using the machine however she has been unable to use it for the last 6 months as she feels that it is broken.  She is interested in getting a replacement machine.  She complains of daytime fatigue and requires naps every day.  She denies any chest pain, palpitations, shortness of breath, fever, chills, cough, night sweats, weight loss.  TEST/EVENTS : Split night study completed in 2016:  best tested pressure 8 cm H20; patient reports her settings are different from that on her current CPAP  Not on File  Immunization History  Administered Date(s) Administered   Influenza Split 01/26/2014   Influenza, Seasonal, Injecte, Preservative Fre 12/22/2022   Influenza,inj,Quad PF,6+ Mos 02/06/2020, 01/10/2021, 12/31/2021   PFIZER(Purple Top)SARS-COV-2 Vaccination 08/12/2019, 09/05/2019, 06/05/2020   PNEUMOCOCCAL CONJUGATE-20 09/17/2023   Tdap 03/17/2013, 09/17/2023   Zoster Recombinant(Shingrix ) 10/20/2018, 01/30/2019    Past Medical History:  Diagnosis Date   Allergy    Anemia    Anxiety    Arthritis    Back pain    Bell's palsy 08/2017   Depression    Depression    Phreesia 05/19/2020   Diverticulosis    GERD (gastroesophageal reflux disease)    Hepatic steatosis     Hiatal hernia    History of Bell's palsy 02/11/2018   Hyperlipidemia    Hypertension    Internal hemorrhoids    Internal hemorrhoids    MS (multiple sclerosis) (HCC)    2007   Multiple sclerosis (HCC) 04/04/2008   Qualifier: Diagnosis of  By: Antonetta MD, Margaret     Schatzki's ring    Sleep apnea    wears CPAP    Tobacco History: Social History   Tobacco Use  Smoking Status Never  Smokeless Tobacco Never   Counseling given: Not Answered   Outpatient Medications Prior to Visit  Medication Sig Dispense Refill   amLODipine  (NORVASC ) 10 MG tablet TAKE 1 TABLET BY MOUTH DAILY 90 tablet 1   aspirin  81 MG tablet Take 81 mg by mouth daily.     buPROPion  (WELLBUTRIN  XL) 300 MG 24 hr tablet Take 1 tablet (300 mg total) by mouth every morning. 90 tablet 2   clonazePAM  (KLONOPIN ) 0.5 MG tablet Take 1 tablet (0.5 mg total) by mouth daily as needed. for anxiety 30 tablet 2   dexlansoprazole  (DEXILANT ) 60 MG capsule Take 1 capsule (60 mg total) by mouth daily. 90 capsule 3   DULoxetine  (CYMBALTA ) 60 MG capsule Take 2 capsules (120 mg total) by mouth daily. 180 capsule 3   ezetimibe  (ZETIA ) 10 MG tablet TAKE 1 TABLET BY MOUTH DAILY 90 tablet 3   famotidine  (PEPCID ) 20 MG tablet Take 1 tablet (20 mg total) by  mouth at bedtime. 90 tablet 3   fenofibrate  (TRICOR ) 48 MG tablet TAKE 1 TABLET BY MOUTH DAILY 90 tablet 3   gabapentin  (NEURONTIN ) 600 MG tablet TAKE 1 TABLET BY MOUTH 3 TIMES  DAILY 270 tablet 3   linaclotide  (LINZESS ) 145 MCG CAPS capsule Take 1 capsule (145 mcg total) by mouth daily before breakfast. Pharmacy- please d/c trulance  rx. Insurance will not cover 30 capsule 0   metaxalone  (SKELAXIN ) 800 MG tablet Take one tablet  by mouth at bedtime  for muscle spasm 90 tablet 1   metaxalone  (SKELAXIN ) 800 MG tablet Take one tablet by mouth at bedtime, as needed, for muscle spasm 30 tablet 3   methylphenidate  (RITALIN ) 10 MG tablet Take 1 tablet (10 mg total) by mouth every morning. 30  tablet 0   methylphenidate  (RITALIN ) 10 MG tablet Take 1 tablet (10 mg total) by mouth every morning. 30 tablet 0   methylphenidate  (RITALIN ) 10 MG tablet Take 1 tablet (10 mg total) by mouth every morning. 30 tablet 0   Multiple Vitamin (MULTIVITAMIN WITH MINERALS) TABS tablet Take 1 tablet by mouth daily.     oxybutynin  (DITROPAN -XL) 10 MG 24 hr tablet Take 10 mg by mouth at bedtime.     pantoprazole  (PROTONIX ) 40 MG tablet TAKE 1 TABLET BY MOUTH TWICE DAILY BEFORE A MEAL 60 tablet 0   potassium chloride  SA (KLOR-CON  M) 20 MEQ tablet Take 1 tablet (20 mEq total) by mouth 2 (two) times daily. 180 tablet 2   prazosin  (MINIPRESS ) 2 MG capsule Take 1 capsule (2 mg total) by mouth at bedtime. 90 capsule 3   rosuvastatin  (CRESTOR ) 40 MG tablet TAKE 1 TABLET BY MOUTH DAILY 90 tablet 3   Vitamin D , Ergocalciferol , (DRISDOL ) 1.25 MG (50000 UNIT) CAPS capsule TAKE 1 CAPSULE BY MOUTH EVERY 7  DAYS 13 capsule 3   zolpidem  (AMBIEN ) 10 MG tablet Take 1 tablet (10 mg total) by mouth at bedtime as needed. for sleep 30 tablet 2   Facility-Administered Medications Prior to Visit  Medication Dose Route Frequency Provider Last Rate Last Admin   gadopentetate dimeglumine  (MAGNEVIST ) injection 20 mL  20 mL Intravenous Once PRN Sater, Charlie LABOR, MD         Review of Systems:   Constitutional:   No  weight loss, night sweats,  Fevers, chills, or  lassitude.  Positive for fatigue and daily nap requirement  HEENT:   No headaches,  Difficulty swallowing,  Tooth/dental problems, or  Sore throat,                No sneezing, itching, ear ache, nasal congestion, post nasal drip,   CV:  No chest pain,  Orthopnea, PND, swelling in lower extremities, anasarca, dizziness, palpitations, syncope.   GI  No heartburn, indigestion, abdominal pain, nausea, vomiting, diarrhea, change in bowel habits, loss of appetite, bloody stools.   Resp: No shortness of breath with exertion or at rest.  No excess mucus, no productive cough,   No non-productive cough,  No coughing up of blood.  No change in color of mucus.  No wheezing.  No chest wall deformity  Skin: no rash or lesions.  GU: no dysuria, change in color of urine, no urgency or frequency.  No flank pain, no hematuria   MS:  No joint pain or swelling.  No decreased range of motion.  No back pain.    Physical Exam  BP 117/68 (BP Location: Left Arm, Patient Position: Sitting)   Pulse 87  Ht 5' 3 (1.6 m)   Wt 171 lb (77.6 kg)   SpO2 96%   BMI 30.29 kg/m   GEN: A/Ox3; pleasant , NAD, well nourished    HEENT:  [place]/AT,  EACs-clear, TMs-wnl, NOSE-clear, THROAT-clear, no lesions, no postnasal drip or exudate noted.  Mallampati 3  NECK:  Supple w/ fair ROM; no JVD; normal carotid impulses w/o bruits; no thyromegaly or nodules palpated; no lymphadenopathy.    RESP  Clear  P & A; w/o, wheezes/ rales/ or rhonchi. no accessory muscle use, no dullness to percussion  CARD:  RRR, no m/r/g, no peripheral edema, pulses intact, no cyanosis or clubbing.  GI:   Soft & nt; nml bowel sounds; no organomegaly or masses detected.   Musco: Warm bil, no deformities or joint swelling noted.   Neuro: alert, no focal deficits noted.    Skin: Warm, no lesions or rashes    Lab Results:  CBC    Component Value Date/Time   WBC 8.8 09/17/2023 1633   WBC 7.1 08/10/2019 1403   RBC 4.79 09/17/2023 1633   RBC 4.80 08/10/2019 1403   HGB 12.6 09/17/2023 1633   HCT 39.2 09/17/2023 1633   PLT 365 09/17/2023 1633   MCV 82 09/17/2023 1633   MCH 26.3 (L) 09/17/2023 1633   MCH 26.3 (L) 08/10/2019 1403   MCHC 32.1 09/17/2023 1633   MCHC 32.2 08/10/2019 1403   RDW 15.7 (H) 09/17/2023 1633   LYMPHSABS 1.0 11/10/2017 1229   MONOABS 0.6 11/10/2017 1229   EOSABS 0.0 11/10/2017 1229   BASOSABS 0.0 11/10/2017 1229    BMET    Component Value Date/Time   NA 144 09/17/2023 1633   K 3.7 09/17/2023 1633   CL 103 09/17/2023 1633   CO2 23 09/17/2023 1633   GLUCOSE 100 (H)  09/17/2023 1633   GLUCOSE 85 08/10/2019 1403   BUN 11 09/17/2023 1633   CREATININE 1.10 (H) 09/17/2023 1633   CREATININE 1.91 (H) 08/10/2019 1403   CALCIUM  9.8 09/17/2023 1633   GFRNONAA 57 (L) 06/05/2020 1432   GFRNONAA 28 (L) 08/10/2019 1403   GFRAA 65 06/05/2020 1432   GFRAA 32 (L) 08/10/2019 1403    BNP No results found for: BNP  ProBNP No results found for: PROBNP  Imaging: No results found.  Administration History     None           No data to display          No results found for: NITRICOXIDE   Assessment & Plan:   Assessment & Plan OSA (obstructive sleep apnea) - Given the fact that her sleep study is 63 years old, she has not used her machine in 6 months, and she is unclear what her settings are we will order split-night study to further evaluate -Pending results will resume CPAP at best tested pressure; follow-up for compliance download 4 to 6 weeks after institution of therapy.   Return in about 9 weeks (around 02/16/2024) for compliance download and sleep study review.  Candis Dandy, PA-C 12/15/2023

## 2023-12-24 ENCOUNTER — Other Ambulatory Visit: Payer: Self-pay | Admitting: Family Medicine

## 2024-01-18 ENCOUNTER — Ambulatory Visit: Admitting: Family Medicine

## 2024-01-23 ENCOUNTER — Encounter: Payer: Self-pay | Admitting: Family Medicine

## 2024-01-24 ENCOUNTER — Other Ambulatory Visit: Payer: Self-pay

## 2024-01-24 MED ORDER — ROSUVASTATIN CALCIUM 40 MG PO TABS: 40.0000 mg | ORAL_TABLET | Freq: Every day | ORAL | 3 refills | Status: AC

## 2024-01-24 MED ORDER — FENOFIBRATE 48 MG PO TABS: 48.0000 mg | ORAL_TABLET | Freq: Every day | ORAL | 3 refills | Status: AC

## 2024-02-08 ENCOUNTER — Ambulatory Visit (HOSPITAL_BASED_OUTPATIENT_CLINIC_OR_DEPARTMENT_OTHER): Admitting: Internal Medicine

## 2024-02-08 DIAGNOSIS — G4733 Obstructive sleep apnea (adult) (pediatric): Secondary | ICD-10-CM | POA: Insufficient documentation

## 2024-02-09 ENCOUNTER — Telehealth (HOSPITAL_COMMUNITY): Admitting: Psychiatry

## 2024-02-13 NOTE — Procedures (Signed)
 Indications for Polysomnography The patient is a 63 year old Female who is 5' 3 and weighs 170.0 lbs.  Her BMI equals 30.1.  A diagnostic polysomnogram was performed to evaluate for -.  After 146.5 minutes of sleep time the patient exhibited sufficient respiratory events qualifying  her for a CPAP trial which was then initiated.  Patient reported taking her medications at 8:30 pm.FenofibrateRosuvastatinZetia Polysomnogram Data A full night polysomnogram was performed recording the standard physiologic parameters including EEG, EOG, EMG, EKG, nasal and oral airflow.  Respiratory parameters of chest and abdominal movements are recorded with Piezo-Crystal motion transducers.   Oxygen saturation was recorded by pulse oximetry.  Sleep Architecture The total recording time of the diagnostic portion of the study was 248.9 minutes.  The total sleep time was 146.5 minutes.  During the diagnostic portion of the study, the patient spent 49.1% of total sleep time in Stage N1, 50.2% in Stage N2, 0.7% in  Stages N3, and 0.0% in REM.   Sleep latency was 69.4 minutes.  REM latency was - minutes.  Sleep Efficiency was 58.8%.  Wake after Sleep Onset time was 33.0 minutes.  At 02:13:33 AM the patient was placed on PAP treatment and was titrated at pressures ranging from 6/2* cm/H20 with supplemental oxygen at - up to 14/2* cm/H20 with supplemental oxygen at -.  The total recording time of the treatment portion of the study  was 149.6 minutes.  The total sleep time was 114.0 minutes.  During the treatment portion of the study, the patient spent 32.5% of total sleep time in Stage N1, 67.5% in Stage N2, 0.0% in Stages N3, and 0.0% in REM.   Sleep latency was 20.5 minutes.  REM  latency was - minutes.  Sleep Efficiency was 76.2%.  Wake after Sleep Onset time was 15.0 minutes.  Respiratory Events During the diagnostic portion of the study, the polysomnogram revealed a presence of - obstructive, - central, and - mixed  apneas resulting in an Apnea index of - events per hour.  There were 48 hypopneas (GreaterEqual to3% desaturation and/or arousal)  resulting in an Apnea\Hypopnea Index (AHI GreaterEqual to3% desaturation and/or arousal) of 19.7 events per hour.  There were 6 hypopneas (GreaterEqual to4% desaturation) resulting in an Apnea\Hypopnea Index (AHI GreaterEqual to4% desaturation) of 2.5  events per hour.  There were 104 Respiratory Effort Related Arousals resulting in a RERA index of 42.6 events per hour. The Respiratory Disturbance Index is 62.3 events per hour.  The snore index was - events per hour.  Mean oxygen saturation was 92.0%.   The lowest oxygen saturation during sleep was 88.0%.  Time spent LessEqual to88% oxygen saturation was  minutes ().  During the treatment portion of the study, the polysomnogram revealed a presence of - obstructive, 1 central, and - mixed apneas resulting in an Apnea index of 0.5 events per hour.  There were 7 hypopneas (GreaterEqual to3% desaturation and/or arousal)  resulting in an Apnea\Hypopnea Index (AHI GreaterEqual to3% desaturation and/or arousal) of 4.2 events per hour.  There were 1 hypopneas (GreaterEqual to4% desaturation) resulting in an Apnea\Hypopnea Index (AHI GreaterEqual to4% desaturation) of 1.1  events per hour.  There were 50 Respiratory Effort Related Arousals resulting in a RERA index of 26.3 events per hour. The Respiratory Disturbance Index is 30.5 events per hour.  The snore index was - events per hour.  Mean oxygen saturation was 93.2%.   The lowest oxygen saturation during sleep was 90.0%.  Time spent LessEqual to88% oxygen saturation  was  minutes ().  Limb Activity During the diagnostic portion of the study, there were 4 limb movements recorded.  Of this total, 4 were classified as PLMs.  Of the PLMs, 1 were associated with arousals.  The Limb Movement index was 1.6 per hour while the PLM index was 1.6 per hour.  During the treatment portion of  the study, there were 36 limb movements recorded.  Of this total, 34 were classified as PLMs.  Of the PLMs, 21 were associated with arousals.  The Limb Movement index was 18.9 per hour while the PLM index was 17.9 per  hour.  Cardiac Summary During the diagnostic portion of the study, the average pulse rate was 75.8 bpm.  The minimum pulse rate was 59.0 bpm while the maximum pulse rate was 97.0 bpm.  During the treatment portion of the study, the average pulse rate was 59.8 bpm.  The minimum pulse rate was 53.0 bpm while the maximum pulse rate was 81.0 bpm.  Comment :  Diagnosis:  Recommendations:   This study was personally reviewed and electronically signed by: Dr. Reggy Salt Accredited Board Certified in Sleep Medicine Date/Time:

## 2024-02-13 NOTE — Procedures (Signed)
 [facility] [address] [place], [place] [address] Tel: [phone]   Fax: [phone]  Split Night Interpretation  Patient Name:  Pamela Walters, Pamela Walters Study Date:  02/08/2024 Referring Physician:  JOAN GILLIS [phone]) %%startinterp%% Indications for Polysomnography The patient is a 63 year old Female who is 5' 3 and weighs 170.0 lbs.  Her BMI equals 30.1.  A diagnostic polysomnogram was performed to evaluate for -.OSA  After 146.5 minutes of sleep time the patient exhibited sufficient respiratory events qualifying her for a CPAP trial which was then initiated.    Patient reported taking her medications at 8:30 pm.  Fenofibrate   Rosuvastatin   Zetia    Polysomnogram Data A full night polysomnogram was performed recording the standard physiologic parameters including EEG, EOG, EMG, EKG, nasal and oral airflow.  Respiratory parameters of chest and abdominal movements are recorded with Piezo-Crystal motion transducers.  Oxygen saturation was recorded by pulse oximetry.    Sleep Architecture The total recording time of the diagnostic portion of the study was 248.9 minutes.  The total sleep time was 146.5 minutes.  During the diagnostic portion of the study, the patient spent 49.1% of total sleep time in Stage N1, 50.2% in Stage N2, 0.7% in Stages N3, and 0.0% in REM.   Sleep latency was 69.4 minutes.  REM latency was - minutes.  Sleep Efficiency was 58.8%.  Wake after Sleep Onset time was 33.0 minutes.   At 02:13:33 AM the patient was placed on PAP treatment and was titrated at pressures ranging from 6/2* cm/H20 with supplemental oxygen at - up to 14/2* cm/H20 with supplemental oxygen at -.  The total recording time of the treatment portion of the study was 149.6 minutes.  The total sleep time was 114.0 minutes.  During the treatment portion of the study, the patient spent 32.5% of total sleep time in Stage N1, 67.5% in Stage N2, 0.0% in Stages N3, and  0.0% in REM.   Sleep latency was 20.5 minutes.  REM latency was - minutes.  Sleep Efficiency was 76.2%.  Wake after Sleep Onset time was 15.0 minutes.  Respiratory Events During the diagnostic portion of the study, the polysomnogram revealed a presence of - obstructive, - central, and - mixed apneas resulting in an Apnea index of - events per hour.  There were 48 hypopneas (>=3% desaturation and/or arousal) resulting in an Apnea\Hypopnea Index (AHI >=3% desaturation and/or arousal) of 19.7 events per hour.  There were 6 hypopneas (>=4% desaturation) resulting in an Apnea\Hypopnea Index (AHI >=4% desaturation) of 2.5 events per hour.  There were 104 Respiratory Effort Related Arousals resulting in a RERA index of 42.6 events per hour. The Respiratory Disturbance Index is 62.3 events per hour.  The snore index was - events per hour.  Mean oxygen saturation was 92.0%.  The lowest oxygen saturation during sleep was 88.0%.  Time spent <=88% oxygen saturation was 0.8 minutes (0.3%).  During the treatment portion of the study, the polysomnogram revealed a presence of - obstructive, 1 central, and - mixed apneas resulting in an Apnea index of 0.5 events per hour.  There were 7 hypopneas (>=3% desaturation and/or arousal) resulting in an Apnea\Hypopnea Index (AHI >=3% desaturation and/or arousal) of 4.2 events per hour.  There were 1 hypopneas (>=4% desaturation) resulting in an Apnea\Hypopnea Index (AHI >=4% desaturation) of 1.1 events per hour.  There were 50 Respiratory Effort Related Arousals resulting in a RERA index of 26.3 events per hour. The Respiratory Disturbance Index  is 30.5 events per hour.  The snore index was - events per hour.  Mean oxygen saturation was 93.2%.  The lowest oxygen saturation during sleep was 90.0%.  Time spent <=88% oxygen saturation was - minutes (-).  Limb Activity During the diagnostic portion of the study, there were 4 limb movements recorded.  Of this total, 4 were classified as  PLMs.  Of the PLMs, 1 were associated with arousals.  The Limb Movement index was 1.6 per hour while the PLM index was 1.6 per hour.  During the treatment portion of the study, there were 36 limb movements recorded.  Of this total, 34 were classified as PLMs.  Of the PLMs, 21 were associated with arousals.  The Limb Movement index was 18.9 per hour while the PLM index was 17.9 per hour.  Cardiac Summary During the diagnostic portion of the study, the average pulse rate was 75.8 bpm.  The minimum pulse rate was 59.0 bpm while the maximum pulse rate was 97.0 bpm.  During the treatment portion of the study, the average pulse rate was 59.8 bpm.  The minimum pulse rate was 53.0 bpm while the maximum pulse rate was 81.0 bpm.   Comment : Moderate obstructive sleep apnea, AHI(3%) 19.7/hr, RDI 62.3/hr.  Snoring with oxygen desaturation to a nadir of 88%, mean 92%. CPAP titration to 14 cwp, with residual AHI (3%) 0/hr, saturation nadir 93%, mean 94.3%.  Diagnosis: Obstructive sleep apnea  Recommendations: Suggest autopap 10-18 or fixed CPAP 14. Patient wore a small ResMed AirFit X-30i nasal-oral full face mask with heated humidity.   This study was personally reviewed and electronically signed by: Dr. Reggy Salt Accredited Board Certified in Sleep Medicine Date/Time: 02/13/24  1:19    %%endinterp%%  Split Night Report  Patient Name: Pamela Walters, Pamela Walters Study Date: 02/08/2024  Date of Birth: 03-05-1961 Study Type: Split Night  Age: 74 year MRN #: [id number]  Sex: Female Interpreting Physician: SALT REGGY, [id number]  Height: 5' 3 Referring Physician: JOAN GILLIS [phone])  Weight: 170.0 lbs Recording Tech: Orie Sires RRT RPSGT RST  BMI: 30.1 Scoring Tech: Orie Sires RRT RPSGT RST  ESS: 3 Neck Size: 14.5  Mask Type ResMed AirFit X-30I Final Pressure: 14  Mask Size: Small Supplemental O2: -   Study Overview  DIAGNOSTIC TREATMENT  Lights Off: 10:04:24 PM Lights Off: 02:13:21 AM  Lights  On: 02:13:21 AM Lights On: 04:42:55 AM  Time in Bed: 248.9 min. Time in Bed: 149.6 min.  Total Sleep Time: 146.5 min. Total Sleep Time: 114.0 min.  Sleep Efficiency: 58.8% Sleep Efficiency: 76.2%  Sleep Latency: 69.4 min. Sleep Latency: 20.5 min.  REM Latency from Sleep Onset: - min. REM Latency from Sleep Onset: - min.  Wake After Sleep Onset: 33.0 min. Wake After Sleep Onset: 15.0 min.   DIAGNOSTIC TREATMENT   Count Index  Count Index  Awakenings: 40 16.4 Awakenings: 20 10.5  Arousals: 204 83.5 Arousals: 135 71.1  AHI (>=3% Desat and/or Ar.): 48 19.7 AHI (>=3% Desat and/or Ar.): 8 4.2  AHI (>=4% Desat): 6 2.5 AHI (>=4% Desat): 2 1.1   Limb Movements: 4 1.6 Limb Movements: 36 18.9  Snore: - - Snore: - -  Desaturations: 53 21.7 Desaturations: 6 3.2  Minimum SpO2 TST: 88.0% Minimum SpO2 TST: 90.0%    Sleep Architecture   DIAGNOSTIC TREATMENT ENTIRE NIGHT  Stages Time (mins) % Sleep Time Time (mins) % Sleep Time Time (mins) % Sleep Time  Wake 102.5  36.0  138.5  Stage N1 72.0 49.1% 37.0 32.5% 109.0 41.8%  Stage N2 73.5 50.2% 77.0 67.5% 150.5 57.8%  Stage N3 1.0 0.7% 0.0 0.0% 1.0 0.4%  REM 0.0 0.0% 0.0 0.0% 0.0 0.0%   Arousal Summary   DIAGNOSTIC TREATMENT   NREM REM TST Index NREM REM TST Index  Respiratory Ar. 136 - 136 55.7 51 - 51 26.8  PLM Ar. 1 - 1 0.4 21 - 21 11.1  Isolated Limb Movement Ar. - - - - 1 - 1 0.5  Snore Ar. - - - - - - - -  Spontaneous Ar. 67 - 67 27.4 62 - 62 32.6  Total Ar. 204 - 204 83.5 135 - 135 71.1    Respiratory Summary  DIAGNOSTIC By Sleep Stage By Body Position Total   NREM REM Supine Non-Supine   Time (min) 146.5 0.0 - 146.5 146.5         Obstructive Apnea - - - - -  Mixed Apnea - - - - -  Central Apnea - - - - -  Total Apneas - - - - -  Total Apnea Index - - - - -         Hypopneas (>=3% Desat and/or Ar.) 48 - - 48 48  AHI (>=3% Desat and/or Ar.) 19.7 - - 19.7 19.7         Hypopneas (>=4% Desat) 6 - - 6 6  AHI (>=4% Desat) 2.5 - -  2.5 2.5          RERAs 104 - - 104 104  RERA Index 42.6 - - 42.6 42.6         RDI 62.3 - - 62.3 62.3    TREATMENT By Sleep Stage By Body Position Total   NREM REM Supine Non-Supine   Time (min) 114.0 0.0 - 114.0 114.0         Obstructive Apnea - - - - -  Mixed Apnea - - - - -  Central Apnea 1 - - 1 1  Total Apneas 1 - - 1 1  Total Apnea Index 0.5 - - 0.5 0.5         Hypopneas (>=3% Desat and/or Ar.) 7 - - 7 7  AHI (>=3% Desat and/or Ar.) 4.2 - - 4.2 4.2         Hypopneas (>=4% Desat) 1 - - 1 1  AHI (>=4% Desat) 1.1 - - 1.1 1.1          RERAs 50 - - 50 50  RERA Index 26.3 - - 26.3 26.3         RDI 30.5 - - 30.5 30.5    Respiratory Event Durations   DIAGNOSTIC TREATMENT  Apnea NREM REM NREM REM  Average (seconds) - - 13.1 -  Maximum (seconds) - - 13.1 -  Hypopnea      Average (seconds) 20.6 - 20.1 -  Maximum (seconds) 34.6 - 38.6 -    Limb Movement Summary   DIAGNOSTIC TREATMENT   Count Index Count Index  Isolated Limb Movements - - 2 1.1  Periodic Limb Movements (PLMs) 4 1.6 34 17.9  Total Limb Movements 4 1.6 36 18.9    Oxygen Saturation Summary   DIAGNOSTIC TREATMENT   Wake NREM REM TST Wake NREM REM TST  Average SpO2 92.7% 91.6% - 91.6% 92.9% 93.3% - 93.3%  Minimum SpO2 88.0% 88.0% - 88.0%  91.0% 90.0% - 90.0%   Maximum SpO2 98.0% 95.0% - 95.0%  98.0% 96.0% -  96.0%    DIAGNOSTIC Oxygen Saturation Distribution  Range (%) Time in range (min) Time in range (%)   90.0 - 100.0 221.1 88.8%  80.0 - 90.0 27.9 11.2%  70.0 - 80.0 - -  60.0 - 70.0 - -  50.0 - 60.0 - -  0.0 - 50.0 - -  Time Spent <=88% SpO2  Range (%) Time in range (min) Time in range (%)  0.0 - 88.0 0.8 0.3%      Count Index  Desaturations: 53 21.7   TREATMENT Oxygen Saturation Distribution  Range (%) Time in range (min) Time in range (%)   90.0 - 100.0 149.3 100.0%  80.0 - 90.0 0.0 0.0%  70.0 - 80.0 - -  60.0 - 70.0 - -  50.0 - 60.0 - -  0.0 - 50.0 - -  Time Spent <=88%  SpO2  Range (%) Time in range (min) Time in range (%)  0.0 - 88.0 - -      Count Index  Desaturations: 6 3.2     Cardiac Summary   DIAGNOSTIC TREATMENT   Wake NREM REM Total Wake NREM REM Total  Average Pulse Rate (BPM) 80.1 72.7 - 75.8 63.1 58.8 - 59.8  Minimum Pulse Rate (BPM) 59.0 60.0 - 59.0 53.0 53.0 - 53.0  Maximum Pulse Rate (BPM) 97.0 82.0 - 97.0 81.0 67.0 - 81.0   Pulse Rate Distribution   DIAGNOSTIC  Range (bpm) Time in range (min) Time in range (%)  0.0 - 40.0 - -  40.0 - 60.0 0.1 0.1%  60.0 - 80.0 170.7 68.6%  80.0 - 100.0 76.7 30.8%  100.0 - 120.0 - -  120.0 - 140.0 - -  140.0 - 200.0 - -   TREATMENT  Range (bpm) Time in range (min) Time in range (%)  0.0 - 40.0 - -  40.0 - 60.0 86.7 57.8%  60.0 - 80.0 63.2 42.1%  80.0 - 100.0 0.2 0.1%  100.0 - 120.0 - -  120.0 - 140.0 - -  140.0 - 200.0 - -    Titration Summary  PAP Device PAP Level O2 Level Time (min) Wake (min) NREM (min) REM (min) Supine TST (min) Sleep Eff% OA# CA# MA# Hyp# (>=3%) AHI (>=3%) Hyp# (>=4%) AHI (>=%4) RERA RDI SpO2 <=88% (min) Min SpO2 Mean SpO2 Ar. Index  - Off - 249.0 102.5 146.5 0.0  0.0 58.8% - - - 48 19.7 6  2.5 104  62.3  0.6 88.0 91.6 83.5  CPAP EPR 6/2 - 40.0 26.0 14.0 0.0  35.0% - 1 - 1 8.6 -  4.3 15  72.9  0.0 91.0 92.4 90.0  CPAP EPR 8/2 - 17.5 1.0 16.5 0.0  94.3% - - - 3 10.9 1  3.6 19  80.0  0.0 90.0 92.7 94.5  CPAP EPR 10/2 - 22.0 1.0 21.0 0.0  95.5% - - - 2 5.7 -  - 10  34.3  0.0 91.0 93.0 57.1  CPAP EPR 12/2 - 55.5 5.0 50.5 0.0  91.0% - - - 1 1.2 -  - 5  7.1  0.0 92.0 93.5 59.4  CPAP EPR 14/2 - 15.0 3.0 12.0 0.0  80.0% - - - - - -  - 1  5.0  0.0 93.0 94.3 90.0    Hypnograms                           Technologist Comments  Patient is a 63 year old white  female who was sent to the sleep center for OSA. Patient was placed on CPAP of 6 CMH2O with 2 CMH2o of EPR at 2:13 am and was increased to a CPAP pressure of 14 CMH2O with 2 CMH2O of  EPR. There was no audible snoring noted on final pressure setting. Patient was increased for respiratory events with and without a 4% Desat or arousal, and audible snoring. Patient tolerated CPAP trial fairly well. No oxygen was applied. Patient reported taking her medications at 8:30 pm. Occasional PLM's/PLMA's were noted. Questionable cardiac arrhythmias were noted see epochs for examples: 23, 95, 197, 249, 282, 283, 342, 414, 758, etc. One restroom visit was noted. Snoring was noted to range from none to moderate. Note, patient states she sleeps mostly in lateral positions due to back pain. Study was performed in room # 4. Patient was fitted with a Resmed AirFit X-30I nasal/oral full-face mask size small with heated humidification.                             Reggy Salt Diplomate, Biomedical engineer of Sleep Medicine  ELECTRONICALLY SIGNED ON:  02/13/2024, 1:08 PM [facility] PH: [phone]   FX: [phone] ACCREDITED BY THE [employer] OF SLEEP MEDICINE

## 2024-02-14 ENCOUNTER — Encounter (HOSPITAL_BASED_OUTPATIENT_CLINIC_OR_DEPARTMENT_OTHER): Payer: Self-pay

## 2024-02-14 ENCOUNTER — Ambulatory Visit (INDEPENDENT_AMBULATORY_CARE_PROVIDER_SITE_OTHER)

## 2024-02-14 VITALS — BP 130/75 | HR 90 | Ht 63.0 in | Wt 176.0 lb

## 2024-02-14 DIAGNOSIS — G4733 Obstructive sleep apnea (adult) (pediatric): Secondary | ICD-10-CM

## 2024-02-14 NOTE — Patient Instructions (Signed)
 Start CPAP usage; use nightly for at least 4-6 hours, more if able.  Plan for follow up in 31-90 days for compliance download review.

## 2024-02-14 NOTE — Progress Notes (Signed)
 @Patient  ID: Pamela Walters, female    DOB: 1960-10-02, 63 y.o.   MRN: [id number]  Chief Complaint  Patient presents with   Sleep Apnea    Follow up     Referring provider: Antonetta Rollene BRAVO, MD  HPI: Discussed the use of AI scribe software for clinical note transcription with the patient, who gave verbal consent to proceed.  History of Present Illness Pamela Walters is a 63 year old female who presents for review of her sleep study results.  She underwent a split night sleep study. During the study, she was titrated for CPAP therapy. She continues to experience significant daytime fatigue despite sleeping 8-9 hours at night and could easily fall back asleep. She does not have time to take naps during the day but falls asleep while watching TV in the evening. Loud snoring is present, affecting her and her husband's sleep quality.  She has a history of using CPAP therapy approximately eleven years ago, which was discontinued due to equipment issues and difficulty obtaining a replacement. During the recent study, she was fitted with a mask that covers her mouth, which she found comfortable. She previously used a nasal mask and had adapted to sleeping with her mouth closed.  There are no other medications currently being used for her sleep apnea.   TEST/EVENTS :   02/08/2024 Split night sleep study:    Comment : Moderate obstructive sleep apnea, AHI(3%) 19.7/hr, RDI 62.3/hr.  Snoring with oxygen desaturation to a nadir of 88%, mean 92%. CPAP titration to 14 cwp, with residual AHI (3%) 0/hr, saturation nadir 93%, mean 94.3%.   Diagnosis: Obstructive sleep apnea   Recommendations: Suggest autopap 10-18 or fixed CPAP 14. Patient wore a small ResMed AirFit X-30i nasal-oral full face mask with heated humidity.  Immunization History  Administered Date(s) Administered   Influenza Split 01/26/2014   Influenza, Seasonal, Injecte, Preservative Fre 12/22/2022   Influenza,inj,Quad  PF,6+ Mos 02/06/2020, 01/10/2021, 12/31/2021   PFIZER(Purple Top)SARS-COV-2 Vaccination 08/12/2019, 09/05/2019, 06/05/2020   PNEUMOCOCCAL CONJUGATE-20 09/17/2023   Tdap 03/17/2013, 09/17/2023   Zoster Recombinant(Shingrix ) 10/20/2018, 01/30/2019    Past Medical History:  Diagnosis Date   Allergy    Anemia    Anxiety    Arthritis    Back pain    Bell's palsy 08/2017   Depression    Depression    Phreesia 05/19/2020   Diverticulosis    GERD (gastroesophageal reflux disease)    Hepatic steatosis    Hiatal hernia    History of Bell's palsy 02/11/2018   Hyperlipidemia    Hypertension    Internal hemorrhoids    Internal hemorrhoids    MS (multiple sclerosis)    2007   Multiple sclerosis 04/04/2008   Qualifier: Diagnosis of  By: Antonetta MD, Margaret     Schatzki's ring    Sleep apnea    wears CPAP    Tobacco History: Social History   Tobacco Use  Smoking Status Never  Smokeless Tobacco Never   Counseling given: Not Answered   Outpatient Medications Prior to Visit  Medication Sig Dispense Refill   amLODipine  (NORVASC ) 10 MG tablet TAKE 1 TABLET BY MOUTH DAILY 90 tablet 1   aspirin  81 MG tablet Take 81 mg by mouth daily.     buPROPion  (WELLBUTRIN  XL) 300 MG 24 hr tablet Take 1 tablet (300 mg total) by mouth every morning. 90 tablet 2   clonazePAM  (KLONOPIN ) 0.5 MG tablet Take 1 tablet (0.5 mg total) by mouth daily  as needed. for anxiety 30 tablet 2   dexlansoprazole  (DEXILANT ) 60 MG capsule Take 1 capsule (60 mg total) by mouth daily. 90 capsule 3   DULoxetine  (CYMBALTA ) 60 MG capsule Take 2 capsules (120 mg total) by mouth daily. 180 capsule 3   ezetimibe  (ZETIA ) 10 MG tablet TAKE 1 TABLET BY MOUTH DAILY 90 tablet 3   famotidine  (PEPCID ) 20 MG tablet Take 1 tablet (20 mg total) by mouth at bedtime. 90 tablet 3   fenofibrate  (TRICOR ) 48 MG tablet Take 1 tablet (48 mg total) by mouth daily. 90 tablet 3   gabapentin  (NEURONTIN ) 600 MG tablet TAKE 1 TABLET BY MOUTH 3  TIMES  DAILY 270 tablet 3   linaclotide  (LINZESS ) 145 MCG CAPS capsule Take 1 capsule (145 mcg total) by mouth daily before breakfast. Pharmacy- please d/c trulance  rx. Insurance will not cover 30 capsule 0   metaxalone  (SKELAXIN ) 800 MG tablet Take one tablet by mouth at bedtime, as needed, for muscle spasm 30 tablet 3   methylphenidate  (RITALIN ) 10 MG tablet Take 1 tablet (10 mg total) by mouth every morning. 30 tablet 0   Multiple Vitamin (MULTIVITAMIN WITH MINERALS) TABS tablet Take 1 tablet by mouth daily.     oxybutynin  (DITROPAN -XL) 10 MG 24 hr tablet Take 10 mg by mouth at bedtime.     pantoprazole  (PROTONIX ) 40 MG tablet TAKE 1 TABLET BY MOUTH TWICE DAILY BEFORE A MEAL 60 tablet 0   potassium chloride  SA (KLOR-CON  M) 20 MEQ tablet TAKE 1 TABLET BY MOUTH TWICE  DAILY 180 tablet 3   prazosin  (MINIPRESS ) 2 MG capsule Take 1 capsule (2 mg total) by mouth at bedtime. 90 capsule 3   rosuvastatin  (CRESTOR ) 40 MG tablet Take 1 tablet (40 mg total) by mouth daily. 90 tablet 3   Vitamin D , Ergocalciferol , (DRISDOL ) 1.25 MG (50000 UNIT) CAPS capsule TAKE 1 CAPSULE BY MOUTH EVERY 7  DAYS 13 capsule 3   zolpidem  (AMBIEN ) 10 MG tablet Take 1 tablet (10 mg total) by mouth at bedtime as needed. for sleep 30 tablet 2   metaxalone  (SKELAXIN ) 800 MG tablet Take one tablet  by mouth at bedtime  for muscle spasm 90 tablet 1   methylphenidate  (RITALIN ) 10 MG tablet Take 1 tablet (10 mg total) by mouth every morning. 30 tablet 0   methylphenidate  (RITALIN ) 10 MG tablet Take 1 tablet (10 mg total) by mouth every morning. 30 tablet 0   Facility-Administered Medications Prior to Visit  Medication Dose Route Frequency Provider Last Rate Last Admin   gadopentetate dimeglumine  (MAGNEVIST ) injection 20 mL  20 mL Intravenous Once PRN Sater, Charlie LABOR, MD         Review of Systems: as per HPI  Constitutional:   No  weight loss, night sweats,  Fevers, chills, fatigue, or  lassitude.  HEENT:   No headaches,   Difficulty swallowing,  Tooth/dental problems, or  Sore throat,                No sneezing, itching, ear ache, nasal congestion, post nasal drip,   CV:  No chest pain,  Orthopnea, PND, swelling in lower extremities, anasarca, dizziness, palpitations, syncope.   GI  No heartburn, indigestion, abdominal pain, nausea, vomiting, diarrhea, change in bowel habits, loss of appetite, bloody stools.   Resp: No shortness of breath with exertion or at rest.  No excess mucus, no productive cough,  No non-productive cough,  No coughing up of blood.  No change in color of  mucus.  No wheezing.  No chest wall deformity  Skin: no rash or lesions.  GU: no dysuria, change in color of urine, no urgency or frequency.  No flank pain, no hematuria   MS:  No joint pain or swelling.  No decreased range of motion.  No back pain.    Physical Exam  BP 130/75   Pulse 90   Ht 5' 3 (1.6 m)   Wt 176 lb (79.8 kg)   SpO2 96%   BMI 31.18 kg/m   GEN: A/Ox3; pleasant , NAD, well nourished    HEENT:  [place]/AT,  EACs-clear, TMs-wnl, NOSE-clear, THROAT-clear, no lesions, no postnasal drip or exudate noted. Mallampati 3   NECK:  Supple w/ fair ROM; no JVD; normal carotid impulses w/o bruits; no thyromegaly or nodules palpated; no lymphadenopathy.    RESP  Clear  P & A; w/o, wheezes/ rales/ or rhonchi. no accessory muscle use, no dullness to percussion  CARD:  RRR, no m/r/g, no peripheral edema, pulses intact, no cyanosis or clubbing.  GI:   Soft & nt; nml bowel sounds; no organomegaly or masses detected.   Musco: Warm bil, no deformities or joint swelling noted.   Neuro: alert, no focal deficits noted.    Skin: Warm, no lesions or rashes    Lab Results:  CBC    Component Value Date/Time   WBC 8.8 09/17/2023 1633   WBC 7.1 08/10/2019 1403   RBC 4.79 09/17/2023 1633   RBC 4.80 08/10/2019 1403   HGB 12.6 09/17/2023 1633   HCT 39.2 09/17/2023 1633   PLT 365 09/17/2023 1633   MCV 82 09/17/2023 1633   MCH  26.3 (L) 09/17/2023 1633   MCH 26.3 (L) 08/10/2019 1403   MCHC 32.1 09/17/2023 1633   MCHC 32.2 08/10/2019 1403   RDW 15.7 (H) 09/17/2023 1633   LYMPHSABS 1.0 11/10/2017 1229   MONOABS 0.6 11/10/2017 1229   EOSABS 0.0 11/10/2017 1229   BASOSABS 0.0 11/10/2017 1229    BMET    Component Value Date/Time   NA 144 09/17/2023 1633   K 3.7 09/17/2023 1633   CL 103 09/17/2023 1633   CO2 23 09/17/2023 1633   GLUCOSE 100 (H) 09/17/2023 1633   GLUCOSE 85 08/10/2019 1403   BUN 11 09/17/2023 1633   CREATININE 1.10 (H) 09/17/2023 1633   CREATININE 1.91 (H) 08/10/2019 1403   CALCIUM  9.8 09/17/2023 1633   GFRNONAA 57 (L) 06/05/2020 1432   GFRNONAA 28 (L) 08/10/2019 1403   GFRAA 65 06/05/2020 1432   GFRAA 32 (L) 08/10/2019 1403    BNP No results found for: BNP  ProBNP No results found for: PROBNP  Imaging: No results found.  Administration History     None           No data to display          No results found for: NITRICOXIDE   Assessment & Plan:   Assessment & Plan OSA (obstructive sleep apnea) Start CPAP usage; use nightly for at least 4-6 hours, more if able.  Plan for follow up in 31-90 days for compliance download review.   Return in about 7 weeks (around 04/03/2024) for compliance download.  Candis Dandy, PA-C 02/14/2024

## 2024-02-14 NOTE — Assessment & Plan Note (Signed)
 Start CPAP usage; use nightly for at least 4-6 hours, more if able.  Plan for follow up in 31-90 days for compliance download review.

## 2024-02-17 ENCOUNTER — Telehealth: Payer: Self-pay | Admitting: Pharmacy Technician

## 2024-02-17 ENCOUNTER — Other Ambulatory Visit (HOSPITAL_COMMUNITY): Payer: Self-pay

## 2024-02-17 NOTE — Telephone Encounter (Signed)
 Attempted to call pt- vm full

## 2024-02-17 NOTE — Telephone Encounter (Signed)
 Pharmacy Patient Advocate Encounter   Received notification from Onbase that prior authorization for Metaxalone  800mg  tablets is required/requested.   Insurance verification completed.   The patient is insured through [employer].  New insurance effective 10/26/2023   Per test claim:  CHLORZOXAZON TAB 500MG ,METHOCARBAM TAB,CYCLOBENZAPRINE ,TIZANIDINE TABS,IBUPROFEN TAB 400MG  is preferred by the insurance.  If suggested medication is appropriate, Please send in a new RX and discontinue this one. If not, please advise as to why it's not appropriate so that we may request a Prior Authorization. Please note, some preferred medications may still require a PA.  If the suggested medications have not been trialed and there are no contraindications to their use, the PA will not be submitted, as it will not be approved.

## 2024-02-24 ENCOUNTER — Telehealth (INDEPENDENT_AMBULATORY_CARE_PROVIDER_SITE_OTHER): Admitting: Psychiatry

## 2024-02-24 ENCOUNTER — Encounter (HOSPITAL_COMMUNITY): Payer: Self-pay | Admitting: Psychiatry

## 2024-02-24 DIAGNOSIS — F331 Major depressive disorder, recurrent, moderate: Secondary | ICD-10-CM

## 2024-02-24 MED ORDER — PRAZOSIN HCL 2 MG PO CAPS: 2.0000 mg | ORAL_CAPSULE | Freq: Every day | ORAL | 3 refills | Status: AC

## 2024-02-24 MED ORDER — BUPROPION HCL ER (XL) 300 MG PO TB24: 300.0000 mg | ORAL_TABLET | Freq: Every morning | ORAL | 2 refills | Status: AC

## 2024-02-24 MED ORDER — METHYLPHENIDATE HCL 10 MG PO TABS: 10.0000 mg | ORAL_TABLET | ORAL | 0 refills | Status: AC

## 2024-02-24 MED ORDER — CLONAZEPAM 0.5 MG PO TABS: 0.5000 mg | ORAL_TABLET | Freq: Every day | ORAL | 2 refills | Status: AC | PRN

## 2024-02-24 MED ORDER — DULOXETINE HCL 60 MG PO CPEP: 120.0000 mg | ORAL_CAPSULE | Freq: Every day | ORAL | 3 refills | Status: AC

## 2024-02-24 MED ORDER — ZOLPIDEM TARTRATE 10 MG PO TABS: 10.0000 mg | ORAL_TABLET | Freq: Every evening | ORAL | 2 refills | Status: AC | PRN

## 2024-02-24 NOTE — Progress Notes (Signed)
 Virtual Visit via Video Note  I connected with Pamela Walters on 02/24/24 at  1:00 PM EDT by a video enabled telemedicine application and verified that I am speaking with the correct person using two identifiers.  Location: Patient: home Provider: office   I discussed the limitations of evaluation and management by telemedicine and the availability of in person appointments. The patient expressed understanding and agreed to proceed.      I discussed the assessment and treatment plan with the patient. The patient was provided an opportunity to ask questions and all were answered. The patient agreed with the plan and demonstrated an understanding of the instructions.   The patient was advised to call back or seek an in-person evaluation if the symptoms worsen or if the condition fails to improve as anticipated.  I provided 20 minutes of non-face-to-face time during this encounter.   Barnie Gull, MD  [facility] MD/PA/NP OP Progress Note  02/24/2024 1:12 PM Pamela Walters  MRN:  [id number]  Chief Complaint:  Chief Complaint  Patient presents with   Anxiety   Depression   Follow-up   HPI: This patient is a 63 year old married white female lives with her husband in [place]. She is a retired PUBLIC HOUSE MANAGER.   The patient returns for follow-up after 3 months regarding her major depression generalized anxiety and difficulty with focus.  Overall she is doing okay.  She states that she has been very stressed because her grand daughter's husband left the granddaughter and her young daughter.  They are very close that she basically raised this granddaughter.  She states has been very stressful but she is helping her granddaughter quite a bit and spending time with them.  She did have a good cry about it but does not think she is overtly depressed.  She denies thoughts of self-harm or suicide.  She has used the clonazepam  a little bit more recently because of the stress.  She is sleeping well with the  Ambien  and the methylphenidate  helps her stay focused. Visit Diagnosis:    ICD-10-CM   1. Major depressive disorder, recurrent episode, moderate (HCC)  F33.1       Past Psychiatric History: none  Past Medical History:  Past Medical History:  Diagnosis Date   Allergy    Anemia    Anxiety    Arthritis    Back pain    Bell's palsy 08/2017   Depression    Depression    Phreesia 05/19/2020   Diverticulosis    GERD (gastroesophageal reflux disease)    Hepatic steatosis    Hiatal hernia    History of Bell's palsy 02/11/2018   Hyperlipidemia    Hypertension    Internal hemorrhoids    Internal hemorrhoids    MS (multiple sclerosis)    2007   Multiple sclerosis 04/04/2008   Qualifier: Diagnosis of  By: Antonetta MD, Margaret     Schatzki's ring    Sleep apnea    wears CPAP    Past Surgical History:  Procedure Laterality Date   ABDOMINAL HYSTERECTOMY  2005   fibroids   CARDIAC CATHETERIZATION N/A 05/04/2016   Procedure: Left Heart Cath and Coronary Angiography;  Surgeon: Candyce GORMAN Reek, MD;  Location: [facility] INVASIVE CV LAB;  Service: Cardiovascular;  Laterality: N/A;   COLONOSCOPY     ESOPHAGOGASTRODUODENOSCOPY N/A 09/18/2013   Procedure: ESOPHAGOGASTRODUODENOSCOPY (EGD);  Surgeon: Gordy CHRISTELLA Starch, MD;  Location: [facility] ENDOSCOPY;  Service: Gastroenterology;  Laterality: N/A;   REPLACEMENT TOTAL KNEE Left  03/11/2022   UPPER GASTROINTESTINAL ENDOSCOPY     WISDOM TOOTH EXTRACTION      Family Psychiatric History: See below  Family History:  Family History  Problem Relation Age of Onset   Hypertension Mother    Hyperlipidemia Mother    Depression Mother    Hypertension Father    Hypertension Sister    High Cholesterol Sister    Healthy Son    Healthy Daughter    Colon cancer Neg Hx    Esophageal cancer Neg Hx    Rectal cancer Neg Hx    Stomach cancer Neg Hx    Colon polyps Neg Hx     Social History:  Social History   Socioeconomic History   Marital status:  Married    Spouse name: Not on file   Number of children: 2   Years of education: Not on file   Highest education level: Not on file  Occupational History   Occupation: retired  Tobacco Use   Smoking status: Never   Smokeless tobacco: Never  Vaping Use   Vaping status: Never Used  Substance and Sexual Activity   Alcohol use: No   Drug use: No   Sexual activity: Yes    Birth control/protection: Surgical    Comment: hyst  Other Topics Concern   Not on file  Social History Narrative   Not on file   Social Drivers of Health   Financial Resource Strain: Low Risk  (06/05/2020)   Overall Financial Resource Strain (CARDIA)    Difficulty of Paying Living Expenses: Not very hard  Food Insecurity: No Food Insecurity (06/05/2020)   Hunger Vital Sign    Worried About Running Out of Food in the Last Year: Never true    Ran Out of Food in the Last Year: Never true  Transportation Needs: No Transportation Needs (06/05/2020)   PRAPARE - Administrator, Civil Service (Medical): No    Lack of Transportation (Non-Medical): No  Physical Activity: Insufficiently Active (06/05/2020)   Exercise Vital Sign    Days of Exercise per Week: 3 days    Minutes of Exercise per Session: 30 min  Stress: Stress Concern Present (06/05/2020)   [employer] of Occupational Health - Occupational Stress Questionnaire    Feeling of Stress : Very much  Social Connections: Moderately Isolated (06/05/2020)   Social Connection and Isolation Panel    Frequency of Communication with Friends and Family: Once a week    Frequency of Social Gatherings with Friends and Family: Once a week    Attends Religious Services: More than 4 times per year    Active Member of [employer] or Organizations: No    Attends Banker Meetings: Never    Marital Status: Married    Allergies: Not on File  Metabolic Disorder Labs: Lab Results  Component Value Date   HGBA1C 5.5 05/10/2017   MPG 111 05/10/2017   MPG 108  02/20/2016   No results found for: PROLACTIN Lab Results  Component Value Date   CHOL 116 09/17/2023   TRIG 128 09/17/2023   HDL 50 09/17/2023   CHOLHDL 2.4 12/21/2022   VLDL 34 (H) 06/30/2016   LDLCALC 44 09/17/2023   LDLCALC 44 12/21/2022   Lab Results  Component Value Date   TSH 2.710 09/17/2023   TSH 3.210 07/16/2022    Therapeutic Level Labs: No results found for: LITHIUM No results found for: VALPROATE No results found for: CBMZ  Current Medications: Current Outpatient Medications  Medication Sig Dispense Refill   amLODipine  (NORVASC ) 10 MG tablet TAKE 1 TABLET BY MOUTH DAILY 90 tablet 1   aspirin  81 MG tablet Take 81 mg by mouth daily.     buPROPion  (WELLBUTRIN  XL) 300 MG 24 hr tablet Take 1 tablet (300 mg total) by mouth every morning. 90 tablet 2   clonazePAM  (KLONOPIN ) 0.5 MG tablet Take 1 tablet (0.5 mg total) by mouth daily as needed. for anxiety 30 tablet 2   dexlansoprazole  (DEXILANT ) 60 MG capsule Take 1 capsule (60 mg total) by mouth daily. 90 capsule 3   DULoxetine  (CYMBALTA ) 60 MG capsule Take 2 capsules (120 mg total) by mouth daily. 180 capsule 3   ezetimibe  (ZETIA ) 10 MG tablet TAKE 1 TABLET BY MOUTH DAILY 90 tablet 3   famotidine  (PEPCID ) 20 MG tablet Take 1 tablet (20 mg total) by mouth at bedtime. 90 tablet 3   fenofibrate  (TRICOR ) 48 MG tablet Take 1 tablet (48 mg total) by mouth daily. 90 tablet 3   gabapentin  (NEURONTIN ) 600 MG tablet TAKE 1 TABLET BY MOUTH 3 TIMES  DAILY 270 tablet 3   linaclotide  (LINZESS ) 145 MCG CAPS capsule Take 1 capsule (145 mcg total) by mouth daily before breakfast. Pharmacy- please d/c trulance  rx. Insurance will not cover 30 capsule 0   metaxalone  (SKELAXIN ) 800 MG tablet Take one tablet by mouth at bedtime, as needed, for muscle spasm 30 tablet 3   methylphenidate  (RITALIN ) 10 MG tablet Take 1 tablet (10 mg total) by mouth every morning. 30 tablet 0   Multiple Vitamin (MULTIVITAMIN WITH MINERALS) TABS tablet Take  1 tablet by mouth daily.     oxybutynin  (DITROPAN -XL) 10 MG 24 hr tablet Take 10 mg by mouth at bedtime.     pantoprazole  (PROTONIX ) 40 MG tablet TAKE 1 TABLET BY MOUTH TWICE DAILY BEFORE A MEAL 60 tablet 0   potassium chloride  SA (KLOR-CON  M) 20 MEQ tablet TAKE 1 TABLET BY MOUTH TWICE  DAILY 180 tablet 3   prazosin  (MINIPRESS ) 2 MG capsule Take 1 capsule (2 mg total) by mouth at bedtime. 90 capsule 3   rosuvastatin  (CRESTOR ) 40 MG tablet Take 1 tablet (40 mg total) by mouth daily. 90 tablet 3   Vitamin D , Ergocalciferol , (DRISDOL ) 1.25 MG (50000 UNIT) CAPS capsule TAKE 1 CAPSULE BY MOUTH EVERY 7  DAYS 13 capsule 3   zolpidem  (AMBIEN ) 10 MG tablet Take 1 tablet (10 mg total) by mouth at bedtime as needed. for sleep 30 tablet 2   No current facility-administered medications for this visit.   Facility-Administered Medications Ordered in Other Visits  Medication Dose Route Frequency Provider Last Rate Last Admin   gadopentetate dimeglumine  (MAGNEVIST ) injection 20 mL  20 mL Intravenous Once PRN Sater, Charlie LABOR, MD         Musculoskeletal: Strength & Muscle Tone: within normal limits Gait & Station: normal Patient leans: N/A  Psychiatric Specialty Exam: Review of Systems  HENT:  Positive for dental problem.   All other systems reviewed and are negative.   There were no vitals taken for this visit.There is no height or weight on file to calculate BMI.  General Appearance: Casual and Fairly Groomed  Eye Contact:  Good  Speech:  Clear and Coherent  Volume:  Normal  Mood:  Anxious and Euthymic  Affect:  Congruent  Thought Process:  Goal Directed  Orientation:  Full (Time, Place, and Person)  Thought Content: WDL   Suicidal Thoughts:  No  Homicidal Thoughts:  No  Memory:  Immediate;   Good Recent;   Good Remote;   Good  Judgement:  Good  Insight:  Good  Psychomotor Activity:  Normal  Concentration:  Concentration: Good and Attention Span: Good  Recall:  Good  Fund of Knowledge:  Good  Language: Good  Akathisia:  No  Handed:  Right  AIMS (if indicated): not done  Assets:  Communication Skills Desire for Improvement Resilience Social Support Talents/Skills  ADL's:  Intact  Cognition: WNL  Sleep:  Good   Screenings: GAD-7    Flowsheet Row Office Visit from 09/17/2023 in [facility] Office Visit from 12/22/2022 in [facility] Office Visit from 08/06/2022 in [facility] Office Visit from 07/16/2022 in [facility] Office Visit from 12/31/2021 in [facility] Care  Total GAD-7 Score 10 10 7 8 6    PHQ2-9    Flowsheet Row Office Visit from 09/17/2023 in [facility] Office Visit from 12/22/2022 in [facility] Office Visit from 08/06/2022 in [facility] Care Office Visit from 07/16/2022 in [facility] Primary Care Office Visit from 01/14/2022 in [facility] Primary Care  PHQ-2 Total Score 2 2 3 4  0  PHQ-9 Total Score 11 12 13 13  --   Flowsheet Row Video Visit from 07/11/2021 in [facility] [place] Video Visit from 02/19/2021 in [facility] [place] Video Visit from 01/23/2021 in [facility] at [place]  C-SSRS RISK CATEGORY No Risk No Risk No Risk     Assessment and Plan: This patient is a 63 year old female with a history of major depression, anxiety, chronic fatigue and fibromyalgia as well as nightmares.  She continues to do well on her current regimen.  She will continue Wellbutrin  XL 300 mg daily as well as Cymbalta  60 mg twice daily for depression, prazosin  2 mg at bedtime to prevent nightmares, Ambien  10 mg at bedtime for sleep, methylphenidate  10 mg in the morning for focus and clonazepam  0.5 mg daily as needed for anxiety.  She will return to see me in 3 months  Collaboration  of Care: Collaboration of Care: Primary Care Provider AEB notes are shared with PCP on the epic system  Patient/Guardian was advised Release of Information must be obtained prior to any record release in order to collaborate their care with an outside provider. Patient/Guardian was advised if they have not already done so to contact the registration department to sign all necessary forms in order for us  to release information regarding their care.   Consent: Patient/Guardian gives verbal consent for treatment and assignment of benefits for services provided during this visit. Patient/Guardian expressed understanding and agreed to proceed.    Barnie Gull, MD 02/24/2024, 1:12 PM

## 2024-03-13 ENCOUNTER — Other Ambulatory Visit: Payer: Self-pay | Admitting: Family Medicine

## 2024-03-20 ENCOUNTER — Telehealth (HOSPITAL_BASED_OUTPATIENT_CLINIC_OR_DEPARTMENT_OTHER): Payer: Self-pay

## 2024-03-20 NOTE — Telephone Encounter (Signed)
 Copied from CRM [id number]. Topic: Clinical - Order For Equipment >> Mar 20, 2024 10:28 AM [name] wrote: Reason for CRM: Pamela Walters is calling to state that Pamela Walters is missing a SWO before they can send out the order for her cpap. Please send to F; [phone] and c/b [phone] for questions.

## 2024-03-20 NOTE — Telephone Encounter (Signed)
Fax sent and confirmation received.

## 2024-04-03 ENCOUNTER — Telehealth (HOSPITAL_BASED_OUTPATIENT_CLINIC_OR_DEPARTMENT_OTHER): Payer: Self-pay

## 2024-04-03 DIAGNOSIS — G4733 Obstructive sleep apnea (adult) (pediatric): Secondary | ICD-10-CM

## 2024-04-03 NOTE — Telephone Encounter (Signed)
 Copied from CRM [id number]. Topic: Clinical - Order For Equipment >> Mar 31, 2024  1:49 PM Isabell A wrote: Reason for CRM: Patient cancelled upcoming appointment on 12/9 due to not receiving her CPAP machine - requesting a call back.   Callback number: [phone]

## 2024-04-03 NOTE — Telephone Encounter (Signed)
New order for CPAP placed.

## 2024-04-04 ENCOUNTER — Ambulatory Visit (HOSPITAL_BASED_OUTPATIENT_CLINIC_OR_DEPARTMENT_OTHER)

## 2024-05-04 ENCOUNTER — Other Ambulatory Visit: Payer: Self-pay

## 2024-05-04 ENCOUNTER — Encounter: Payer: Self-pay | Admitting: Family Medicine

## 2024-05-04 DIAGNOSIS — I10 Essential (primary) hypertension: Secondary | ICD-10-CM

## 2024-05-04 DIAGNOSIS — E559 Vitamin D deficiency, unspecified: Secondary | ICD-10-CM

## 2024-05-04 DIAGNOSIS — E785 Hyperlipidemia, unspecified: Secondary | ICD-10-CM

## 2024-05-06 ENCOUNTER — Ambulatory Visit: Payer: Self-pay | Admitting: Family Medicine

## 2024-05-06 LAB — CMP14+EGFR
ALT: 28 IU/L (ref 0–32)
AST: 36 IU/L (ref 0–40)
Albumin: 4.3 g/dL (ref 3.9–4.9)
Alkaline Phosphatase: 107 IU/L (ref 49–135)
BUN/Creatinine Ratio: 18 (ref 12–28)
BUN: 17 mg/dL (ref 8–27)
Bilirubin Total: 0.3 mg/dL (ref 0.0–1.2)
CO2: 22 mmol/L (ref 20–29)
Calcium: 9.8 mg/dL (ref 8.7–10.3)
Chloride: 107 mmol/L — ABNORMAL HIGH (ref 96–106)
Creatinine, Ser: 0.94 mg/dL (ref 0.57–1.00)
Globulin, Total: 2.5 g/dL (ref 1.5–4.5)
Glucose: 99 mg/dL (ref 70–99)
Potassium: 4 mmol/L (ref 3.5–5.2)
Sodium: 144 mmol/L (ref 134–144)
Total Protein: 6.8 g/dL (ref 6.0–8.5)
eGFR: 67 mL/min/1.73

## 2024-05-06 LAB — LIPID PANEL
Chol/HDL Ratio: 3.5 ratio (ref 0.0–4.4)
Cholesterol, Total: 142 mg/dL (ref 100–199)
HDL: 41 mg/dL
LDL Chol Calc (NIH): 75 mg/dL (ref 0–99)
Triglycerides: 151 mg/dL — ABNORMAL HIGH (ref 0–149)
VLDL Cholesterol Cal: 26 mg/dL (ref 5–40)

## 2024-05-06 LAB — VITAMIN D 25 HYDROXY (VIT D DEFICIENCY, FRACTURES): Vit D, 25-Hydroxy: 40 ng/mL (ref 30.0–100.0)

## 2024-05-10 ENCOUNTER — Ambulatory Visit (INDEPENDENT_AMBULATORY_CARE_PROVIDER_SITE_OTHER): Admitting: Family Medicine

## 2024-05-10 VITALS — BP 114/74 | HR 95 | Resp 16 | Ht 63.0 in | Wt 181.0 lb

## 2024-05-10 DIAGNOSIS — Z0000 Encounter for general adult medical examination without abnormal findings: Secondary | ICD-10-CM

## 2024-05-10 DIAGNOSIS — Z9189 Other specified personal risk factors, not elsewhere classified: Secondary | ICD-10-CM | POA: Diagnosis not present

## 2024-05-10 MED ORDER — EZETIMIBE 10 MG PO TABS: 10.0000 mg | ORAL_TABLET | Freq: Every day | ORAL | 3 refills | Status: AC

## 2024-05-10 MED ORDER — AMLODIPINE BESYLATE 10 MG PO TABS: 10.0000 mg | ORAL_TABLET | Freq: Every day | ORAL | 3 refills | Status: AC

## 2024-05-10 MED ORDER — GABAPENTIN 600 MG PO TABS: 600.0000 mg | ORAL_TABLET | Freq: Three times a day (TID) | ORAL | 3 refills | Status: AC

## 2024-05-10 MED ORDER — POTASSIUM CHLORIDE CRYS ER 20 MEQ PO TBCR: 20.0000 meq | EXTENDED_RELEASE_TABLET | Freq: Two times a day (BID) | ORAL | 3 refills | Status: AC

## 2024-05-10 NOTE — Assessment & Plan Note (Signed)
 Refer for coronary calcium  score

## 2024-05-10 NOTE — Progress Notes (Signed)
" ° ° ° ° ° °  Pamela Walters     MRN: [id number]      DOB: [birth year]  Chief Complaint  Patient presents with   Annual Exam    HPI: Patient is in for annual physical exam. Immunization is reviewed , and  updated if needed.   PE: BP 114/74   Pulse 95   Resp 16   Ht 5' 3 (1.6 m)   Wt 181 lb 0.6 oz (82.1 kg)   SpO2 97%   BMI 32.07 kg/m   Pleasant  female, alert and oriented x 3, in no cardio-pulmonary distress. Afebrile. HEENT No facial trauma or asymetry. Sinuses non tender.  Extra occullar muscles intact.. External ears normal, . Neck: supple, no adenopathy,JVD or thyromegaly.No bruits.  Chest: Clear to ascultation bilaterally.No crackles or wheezes. Non tender to palpation  Cardiovascular system; Heart sounds normal,  S1 and  S2 ,no S3.  No murmur, or thrill. Apical beat not displaced Peripheral pulses normal.  Abdomen: Soft, non tender, no organomegaly or masses. No bruits. Bowel sounds normal. No guarding, tenderness or rebound.   Musculoskeletal exam: Full ROM of spine, hips , shoulders and REDUCED IN  knees. No deformity ,swelling or crepitus noted. No muscle wasting or atrophy.   Neurologic: Cranial nerves 2 to 12 intact. Power, tone ,sensation normal throughout. No disturbance in gait. No tremor.  Skin: Intact, no ulceration, erythema , scaling or rash noted. MULTIPLE FRECKLES  throughout  Psych; Normal mood and affect. Judgement and concentration normal   Assessment & Plan:  Encounter for annual physical exam Annual exam as documented Immunization and cancer screening needs are specifically addressed at this visit.\   At increased risk for cardiovascular disease Refer for coronary calcium  score  "

## 2024-05-10 NOTE — Patient Instructions (Addendum)
 Welcome to medicare needed before July, 2026  Please schedule mammogram at checkout  You are being referred for cardiac score   Cataract, teeth, sleep treatment are in the making  Avoid sugar, increase natural foods and walk more regularly   CONGRATULATION on 50 years   Thanks for choosing [facility], we consider it a privelige to serve you.

## 2024-05-10 NOTE — Assessment & Plan Note (Signed)
 Annual exam as documented. . Immunization and cancer screening needs are specifically addressed at this visit.

## 2024-05-17 NOTE — Progress Notes (Unsigned)
 "  Chief Complaint: Primary GI MD: Dr. Albertus   HPI:  *** is a  ***  who was referred to me by Antonetta Rollene BRAVO, MD for a complaint of *** .     Discussed the use of AI scribe software for clinical note transcription with the patient, who gave verbal consent to proceed.  History of Present Illness      PREVIOUS GI WORKUP     Past Medical History:  Diagnosis Date   Allergy    Anemia    Anxiety    Arthritis    Back pain    Bell's palsy 08/2017   Depression    Depression    Phreesia 05/19/2020   Diverticulosis    GERD (gastroesophageal reflux disease)    Hepatic steatosis    Hiatal hernia    History of Bell's palsy 02/11/2018   Hyperlipidemia    Hypertension    Internal hemorrhoids    Internal hemorrhoids    MS (multiple sclerosis)    2007   Multiple sclerosis 04/04/2008   Qualifier: Diagnosis of  By: Antonetta MD, Margaret     Schatzki's ring    Sleep apnea    wears CPAP    Past Surgical History:  Procedure Laterality Date   ABDOMINAL HYSTERECTOMY  2005   fibroids   CARDIAC CATHETERIZATION N/A 05/04/2016   Procedure: Left Heart Cath and Coronary Angiography;  Surgeon: Candyce GORMAN Reek, MD;  Location: [facility] INVASIVE CV LAB;  Service: Cardiovascular;  Laterality: N/A;   COLONOSCOPY     ESOPHAGOGASTRODUODENOSCOPY N/A 09/18/2013   Procedure: ESOPHAGOGASTRODUODENOSCOPY (EGD);  Surgeon: Gordy CHRISTELLA Albertus, MD;  Location: [facility] ENDOSCOPY;  Service: Gastroenterology;  Laterality: N/A;   REPLACEMENT TOTAL KNEE Left 03/11/2022   UPPER GASTROINTESTINAL ENDOSCOPY     WISDOM TOOTH EXTRACTION      Current Outpatient Medications  Medication Sig Dispense Refill   amLODipine  (NORVASC ) 10 MG tablet Take 1 tablet (10 mg total) by mouth daily. 90 tablet 3   aspirin  81 MG tablet Take 81 mg by mouth daily.     buPROPion  (WELLBUTRIN  XL) 300 MG 24 hr tablet Take 1 tablet (300 mg total) by mouth every morning. 90 tablet 2   clonazePAM  (KLONOPIN ) 0.5 MG tablet Take 1 tablet (0.5 mg  total) by mouth daily as needed. for anxiety 30 tablet 2   dexlansoprazole  (DEXILANT ) 60 MG capsule Take 1 capsule (60 mg total) by mouth daily. (Patient not taking: Reported on 05/10/2024) 90 capsule 3   DULoxetine  (CYMBALTA ) 60 MG capsule Take 2 capsules (120 mg total) by mouth daily. 180 capsule 3   ezetimibe  (ZETIA ) 10 MG tablet Take 1 tablet (10 mg total) by mouth daily. 90 tablet 3   famotidine  (PEPCID ) 20 MG tablet Take 1 tablet (20 mg total) by mouth at bedtime. 90 tablet 3   fenofibrate  (TRICOR ) 48 MG tablet Take 1 tablet (48 mg total) by mouth daily. 90 tablet 3   gabapentin  (NEURONTIN ) 600 MG tablet Take 1 tablet (600 mg total) by mouth 3 (three) times daily. 270 tablet 3   linaclotide  (LINZESS ) 145 MCG CAPS capsule Take 1 capsule (145 mcg total) by mouth daily before breakfast. Pharmacy- please d/c trulance  rx. Insurance will not cover 30 capsule 0   metaxalone  (SKELAXIN ) 800 MG tablet Take one tablet by mouth at bedtime, as needed, for muscle spasm 30 tablet 3   methylphenidate  (RITALIN ) 10 MG tablet Take 1 tablet (10 mg total) by mouth every morning. 30 tablet 0  methylphenidate  (RITALIN ) 10 MG tablet Take 1 tablet (10 mg total) by mouth every morning. (Patient not taking: Reported on 05/10/2024) 30 tablet 0   methylphenidate  (RITALIN ) 10 MG tablet Take 1 tablet (10 mg total) by mouth every morning. (Patient not taking: Reported on 05/10/2024) 30 tablet 0   Multiple Vitamin (MULTIVITAMIN WITH MINERALS) TABS tablet Take 1 tablet by mouth daily. (Patient not taking: Reported on 05/10/2024)     oxybutynin  (DITROPAN -XL) 10 MG 24 hr tablet Take 10 mg by mouth at bedtime.     pantoprazole  (PROTONIX ) 40 MG tablet TAKE 1 TABLET BY MOUTH TWICE DAILY BEFORE A MEAL 60 tablet 0   potassium chloride  SA (KLOR-CON  M) 20 MEQ tablet Take 1 tablet (20 mEq total) by mouth 2 (two) times daily. 180 tablet 3   prazosin  (MINIPRESS ) 2 MG capsule Take 1 capsule (2 mg total) by mouth at bedtime. 90 capsule 3    rosuvastatin  (CRESTOR ) 40 MG tablet Take 1 tablet (40 mg total) by mouth daily. 90 tablet 3   Vitamin D , Ergocalciferol , (DRISDOL ) 1.25 MG (50000 UNIT) CAPS capsule TAKE 1 CAPSULE BY MOUTH EVERY 7  DAYS 13 capsule 3   zolpidem  (AMBIEN ) 10 MG tablet Take 1 tablet (10 mg total) by mouth at bedtime as needed. for sleep 30 tablet 2   No current facility-administered medications for this visit.   Facility-Administered Medications Ordered in Other Visits  Medication Dose Route Frequency Provider Last Rate Last Admin   gadopentetate dimeglumine  (MAGNEVIST ) injection 20 mL  20 mL Intravenous Once PRN Sater, Charlie LABOR, MD        Allergies as of 05/18/2024   (Not on File)    Family History  Problem Relation Age of Onset   Hypertension Mother    Hyperlipidemia Mother    Depression Mother    Hypertension Father    Hypertension Sister    High Cholesterol Sister    Healthy Son    Healthy Daughter    Colon cancer Neg Hx    Esophageal cancer Neg Hx    Rectal cancer Neg Hx    Stomach cancer Neg Hx    Colon polyps Neg Hx     Social History   Socioeconomic History   Marital status: Married    Spouse name: Not on file   Number of children: 2   Years of education: Not on file   Highest education level: Associate degree: academic program  Occupational History   Occupation: retired  Tobacco Use   Smoking status: Never   Smokeless tobacco: Never  Vaping Use   Vaping status: Never Used  Substance and Sexual Activity   Alcohol use: No   Drug use: No   Sexual activity: Yes    Birth control/protection: Surgical    Comment: hyst  Other Topics Concern   Not on file  Social History Narrative   Not on file   Social Drivers of Health   Tobacco Use: Low Risk (05/10/2024)   Patient History    Smoking Tobacco Use: Never    Smokeless Tobacco Use: Never    Passive Exposure: Not on file  Financial Resource Strain: Low Risk (05/10/2024)   Overall Financial Resource Strain (CARDIA)     Difficulty of Paying Living Expenses: Not very hard  Food Insecurity: Food Insecurity Present (05/10/2024)   Epic    Worried About Programme Researcher, Broadcasting/film/video in the Last Year: Sometimes true    [employer] of Food in the Last Year: Never true  Transportation  Needs: No Transportation Needs (05/10/2024)   Epic    Lack of Transportation (Medical): No    Lack of Transportation (Non-Medical): No  Physical Activity: Insufficiently Active (05/10/2024)   Exercise Vital Sign    Days of Exercise per Week: 4 days    Minutes of Exercise per Session: 30 min  Stress: Stress Concern Present (05/10/2024)   [employer] of Occupational Health - Occupational Stress Questionnaire    Feeling of Stress: To some extent  Social Connections: Unknown (05/10/2024)   Social Connection and Isolation Panel    Frequency of Communication with Friends and Family: Not on file    Frequency of Social Gatherings with Friends and Family: Once a week    Attends Religious Services: More than 4 times per year    Active Member of [employer] or Organizations: Yes    Attends Banker Meetings: More than 4 times per year    Marital Status: Married  Catering Manager Violence: Not on file  Depression (PHQ2-9): Medium Risk (05/10/2024)   Depression (PHQ2-9)    PHQ-2 Score: 5  Alcohol Screen: Not on file  Housing: Low Risk (05/10/2024)   Epic    Unable to Pay for Housing in the Last Year: No    Number of Times Moved in the Last Year: 0    Homeless in the Last Year: No  Utilities: Not on file  Health Literacy: Not on file    Review of Systems:    Constitutional: No weight loss, fever, chills, weakness or fatigue HEENT: Eyes: No change in vision               Ears, Nose, Throat:  No change in hearing or congestion Skin: No rash or itching Cardiovascular: No chest pain, chest pressure or palpitations   Respiratory: No SOB or cough Gastrointestinal: See HPI and otherwise negative Genitourinary: No dysuria or change in urinary  frequency Neurological: No headache, dizziness or syncope Musculoskeletal: No new muscle or joint pain Hematologic: No bleeding or bruising Psychiatric: No history of depression or anxiety    Physical Exam:  Vital signs: There were no vitals taken for this visit.  Constitutional: NAD, alert and cooperative Head:  Normocephalic and atraumatic. Eyes:   PEERL, EOMI. No icterus. Conjunctiva pink. Respiratory: Respirations even and unlabored. Lungs clear to auscultation bilaterally.   No wheezes, crackles, or rhonchi.  Cardiovascular:  Regular rate and rhythm. No peripheral edema, cyanosis or pallor.  Gastrointestinal:  Soft, nondistended, nontender. No rebound or guarding. Normal bowel sounds. No appreciable masses or hepatomegaly. Rectal:  Declines Msk:  Symmetrical without gross deformities. Without edema, no deformity or joint abnormality.  Neurologic:  Alert and  oriented x4;  grossly normal neurologically.  Skin:   Dry and intact without significant lesions or rashes. Psychiatric: Oriented to person, place and time. Demonstrates good judgement and reason without abnormal affect or behaviors.  Physical Exam    RELEVANT LABS AND IMAGING: CBC    Component Value Date/Time   WBC 8.8 09/17/2023 1633   WBC 7.1 08/10/2019 1403   RBC 4.79 09/17/2023 1633   RBC 4.80 08/10/2019 1403   HGB 12.6 09/17/2023 1633   HCT 39.2 09/17/2023 1633   PLT 365 09/17/2023 1633   MCV 82 09/17/2023 1633   MCH 26.3 (L) 09/17/2023 1633   MCH 26.3 (L) 08/10/2019 1403   MCHC 32.1 09/17/2023 1633   MCHC 32.2 08/10/2019 1403   RDW 15.7 (H) 09/17/2023 1633   LYMPHSABS 1.0 11/10/2017 1229   MONOABS  0.6 11/10/2017 1229   EOSABS 0.0 11/10/2017 1229   BASOSABS 0.0 11/10/2017 1229    CMP     Component Value Date/Time   NA 144 05/05/2024 1120   K 4.0 05/05/2024 1120   CL 107 (H) 05/05/2024 1120   CO2 22 05/05/2024 1120   GLUCOSE 99 05/05/2024 1120   GLUCOSE 85 08/10/2019 1403   BUN 17 05/05/2024  1120   CREATININE 0.94 05/05/2024 1120   CREATININE 1.91 (H) 08/10/2019 1403   CALCIUM  9.8 05/05/2024 1120   PROT 6.8 05/05/2024 1120   ALBUMIN 4.3 05/05/2024 1120   AST 36 05/05/2024 1120   ALT 28 05/05/2024 1120   ALKPHOS 107 05/05/2024 1120   BILITOT 0.3 05/05/2024 1120   GFRNONAA 57 (L) 06/05/2020 1432   GFRNONAA 28 (L) 08/10/2019 1403   GFRAA 65 06/05/2020 1432   GFRAA 32 (L) 08/10/2019 1403     Assessment/Plan:   64 year old female with a history of GERD, dysphagia and esophageal stricture, remote food impaction complicated by esophageal perforation treated conservatively, adenomatous and SSP colon polyps, who is here for follow-up   GERD Dysphagia and history of esophageal stricture Previous EGD 12/2022 with benign-appearing esophageal stenoses dilated to 17 mm with balloon, acute ulcerative gastritis, multiple benign gastric polyps, erythematous duodenitis in bulb without ulceration.  Biopsies negative for H. pylori or abnormal cells and patient was put on Dexilant  60 mg once daily and famotidine  20 mg as she failed twice daily pantoprazole   Chronic idiopathic constipation Plan Trulance  3 Mg daily  History of adenomatous and sessile serrated colon polyps - Due for repeat April 2026    Nestor Blower, DEVONNA [facility] 05/17/2024, 8:20 PM  Cc: Antonetta Rollene BRAVO, MD "

## 2024-05-18 ENCOUNTER — Encounter: Payer: Self-pay | Admitting: Gastroenterology

## 2024-05-18 ENCOUNTER — Ambulatory Visit: Admitting: Gastroenterology

## 2024-05-18 VITALS — BP 124/70 | HR 87 | Ht 63.0 in | Wt 182.0 lb

## 2024-05-18 DIAGNOSIS — R1310 Dysphagia, unspecified: Secondary | ICD-10-CM

## 2024-05-18 DIAGNOSIS — K219 Gastro-esophageal reflux disease without esophagitis: Secondary | ICD-10-CM | POA: Diagnosis not present

## 2024-05-18 DIAGNOSIS — K5904 Chronic idiopathic constipation: Secondary | ICD-10-CM

## 2024-05-18 DIAGNOSIS — Z8719 Personal history of other diseases of the digestive system: Secondary | ICD-10-CM

## 2024-05-18 DIAGNOSIS — K222 Esophageal obstruction: Secondary | ICD-10-CM

## 2024-05-18 DIAGNOSIS — Z860100 Personal history of colon polyps, unspecified: Secondary | ICD-10-CM

## 2024-05-18 DIAGNOSIS — R1319 Other dysphagia: Secondary | ICD-10-CM

## 2024-05-18 DIAGNOSIS — K2970 Gastritis, unspecified, without bleeding: Secondary | ICD-10-CM

## 2024-05-18 DIAGNOSIS — Z860101 Personal history of adenomatous and serrated colon polyps: Secondary | ICD-10-CM | POA: Diagnosis not present

## 2024-05-18 MED ORDER — LUBIPROSTONE 8 MCG PO CAPS: 8.0000 ug | ORAL_CAPSULE | Freq: Two times a day (BID) | ORAL | 3 refills | Status: AC

## 2024-05-18 MED ORDER — NA SULFATE-K SULFATE-MG SULF 17.5-3.13-1.6 GM/177ML PO SOLN: 1.0000 | Freq: Once | ORAL | 0 refills | Status: AC

## 2024-05-18 MED ORDER — VOQUEZNA 10 MG PO TABS: 1.0000 | ORAL_TABLET | Freq: Every day | ORAL | 3 refills | Status: AC

## 2024-05-18 NOTE — Patient Instructions (Signed)
 We have sent the following medications to your pharmacy for you to pick up at your convenience:  Amitiza   Voquezna  was sent to [facility] to try to get the most affordable price.  You have been scheduled for a colonoscopy. Please follow written instructions given to you at your visit today.   If you use inhalers (even only as needed), please bring them with you on the day of your procedure.  DO NOT TAKE 7 DAYS PRIOR TO TEST- Trulicity (dulaglutide) Ozempic, Wegovy (semaglutide) Mounjaro, Zepbound (tirzepatide) Bydureon Bcise (exanatide extended release)  DO NOT TAKE 1 DAY PRIOR TO YOUR TEST Rybelsus (semaglutide) Adlyxin (lixisenatide) Victoza (liraglutide) Byetta (exanatide) ___________________________________________________________________________  _______________________________________________________  If your blood pressure at your visit was 140/90 or greater, please contact your primary care physician to follow up on this.  _______________________________________________________  If you are age 66 or older, your body mass index should be between 23-30. Your Body mass index is 32.24 kg/m. If this is out of the aforementioned range listed, please consider follow up with your Primary Care Provider.  If you are age 66 or younger, your body mass index should be between 19-25. Your Body mass index is 32.24 kg/m. If this is out of the aformentioned range listed, please consider follow up with your Primary Care Provider.   ________________________________________________________  The [place] [facility] providers would like to encourage you to use MYCHART to communicate with providers for non-urgent requests or questions.  Due to long hold times on the telephone, sending your provider a message by [facility] may be a faster and more efficient way to get a response.  Please allow 48 business hours for a response.  Please remember that this is for non-urgent requests.   _______________________________________________________  [facility] is using a team-based approach to care.  Your team is made up of your doctor and two to three APPS. Our APPS (Nurse Practitioners and Physician Assistants) work with your physician to ensure care continuity for you. They are fully qualified to address your health concerns and develop a treatment plan. They communicate directly with your gastroenterologist to care for you. Seeing the Advanced Practice Practitioners on your physician's team can help you by facilitating care more promptly, often allowing for earlier appointments, access to diagnostic testing, procedures, and other specialty referrals.

## 2024-05-25 ENCOUNTER — Other Ambulatory Visit (HOSPITAL_COMMUNITY)

## 2024-06-09 ENCOUNTER — Other Ambulatory Visit (HOSPITAL_COMMUNITY)

## 2024-07-14 ENCOUNTER — Encounter: Admitting: Internal Medicine

## 2024-07-19 ENCOUNTER — Ambulatory Visit: Admitting: Gastroenterology

## 2024-07-20 ENCOUNTER — Encounter: Payer: Self-pay | Admitting: Family Medicine
